# Patient Record
Sex: Male | Born: 1955
Health system: Southern US, Community
[De-identification: ages and names within clinical notes are randomized; demographics above are authoritative.]

## PROBLEM LIST (undated history)

## (undated) DIAGNOSIS — K219 Gastro-esophageal reflux disease without esophagitis: Secondary | ICD-10-CM

## (undated) DIAGNOSIS — E785 Hyperlipidemia, unspecified: Secondary | ICD-10-CM

## (undated) DIAGNOSIS — M1990 Unspecified osteoarthritis, unspecified site: Secondary | ICD-10-CM

## (undated) DIAGNOSIS — T7840XA Allergy, unspecified, initial encounter: Secondary | ICD-10-CM

## (undated) DIAGNOSIS — R05 Cough: Secondary | ICD-10-CM

## (undated) DIAGNOSIS — R059 Cough, unspecified: Secondary | ICD-10-CM

## (undated) DIAGNOSIS — A879 Viral meningitis, unspecified: Secondary | ICD-10-CM

## (undated) DIAGNOSIS — I10 Essential (primary) hypertension: Secondary | ICD-10-CM

## (undated) HISTORY — DX: Viral meningitis, unspecified: A87.9

## (undated) HISTORY — DX: Essential (primary) hypertension: I10

## (undated) HISTORY — PX: TOTAL KNEE ARTHROPLASTY: SHX125

## (undated) HISTORY — DX: Gastro-esophageal reflux disease without esophagitis: K21.9

## (undated) HISTORY — DX: Allergy, unspecified, initial encounter: T78.40XA

## (undated) HISTORY — DX: Unspecified osteoarthritis, unspecified site: M19.90

## (undated) HISTORY — DX: Hyperlipidemia, unspecified: E78.5

## (undated) HISTORY — PX: JOINT REPLACEMENT: SHX530

## (undated) HISTORY — PX: NASAL SINUS SURGERY: SHX719

---
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
## 2007-09-15 ENCOUNTER — Ambulatory Visit: Payer: Self-pay

## 2009-10-03 ENCOUNTER — Ambulatory Visit: Payer: Self-pay | Admitting: Internal Medicine

## 2010-11-08 ENCOUNTER — Emergency Department: Payer: Self-pay | Admitting: Internal Medicine

## 2010-11-12 ENCOUNTER — Ambulatory Visit: Payer: Self-pay | Admitting: Family Medicine

## 2010-12-03 ENCOUNTER — Ambulatory Visit: Payer: Self-pay | Admitting: Pain Medicine

## 2011-05-25 ENCOUNTER — Ambulatory Visit: Payer: Self-pay | Admitting: Nephrology

## 2012-01-16 ENCOUNTER — Inpatient Hospital Stay: Payer: Self-pay | Admitting: Internal Medicine

## 2012-01-16 LAB — CBC
HCT: 42.2 % (ref 40.0–52.0)
MCH: 30.7 pg (ref 26.0–34.0)
MCHC: 33.6 g/dL (ref 32.0–36.0)
RDW: 13.7 % (ref 11.5–14.5)
WBC: 13.3 10*3/uL — ABNORMAL HIGH (ref 3.8–10.6)

## 2012-01-16 LAB — DRUG SCREEN, URINE
Barbiturates, Ur Screen: NEGATIVE (ref ?–200)
Benzodiazepine, Ur Scrn: POSITIVE (ref ?–200)
Cannabinoid 50 Ng, Ur ~~LOC~~: NEGATIVE (ref ?–50)
Methadone, Ur Screen: NEGATIVE (ref ?–300)
Opiate, Ur Screen: POSITIVE (ref ?–300)

## 2012-01-16 LAB — URINALYSIS, COMPLETE
Bilirubin,UR: NEGATIVE
Glucose,UR: NEGATIVE mg/dL (ref 0–75)
Hyaline Cast: 3
Ketone: NEGATIVE
Nitrite: NEGATIVE
Ph: 5 (ref 4.5–8.0)
Protein: 100
RBC,UR: 5 /HPF (ref 0–5)
Specific Gravity: 1.02 (ref 1.003–1.030)

## 2012-01-16 LAB — COMPREHENSIVE METABOLIC PANEL
Alkaline Phosphatase: 70 U/L (ref 50–136)
Anion Gap: 11 (ref 7–16)
Calcium, Total: 8.7 mg/dL (ref 8.5–10.1)
Chloride: 108 mmol/L — ABNORMAL HIGH (ref 98–107)
EGFR (Non-African Amer.): 40 — ABNORMAL LOW
Glucose: 139 mg/dL — ABNORMAL HIGH (ref 65–99)
Osmolality: 289 (ref 275–301)
Potassium: 3.4 mmol/L — ABNORMAL LOW (ref 3.5–5.1)

## 2012-01-16 LAB — TROPONIN I
Troponin-I: 0.22 ng/mL — ABNORMAL HIGH
Troponin-I: 0.85 ng/mL — ABNORMAL HIGH

## 2012-01-16 LAB — HEMOGLOBIN A1C: Hemoglobin A1C: 5.7 % (ref 4.2–6.3)

## 2012-01-16 LAB — CK TOTAL AND CKMB (NOT AT ARMC)
CK, Total: 2519 U/L — ABNORMAL HIGH (ref 35–232)
CK-MB: 8 ng/mL — ABNORMAL HIGH (ref 0.5–3.6)

## 2012-01-17 DIAGNOSIS — I517 Cardiomegaly: Secondary | ICD-10-CM

## 2012-01-17 DIAGNOSIS — R748 Abnormal levels of other serum enzymes: Secondary | ICD-10-CM

## 2012-01-17 LAB — CBC WITH DIFFERENTIAL/PLATELET
Basophil #: 0 x10 3/mm 3
Basophil %: 0.2 %
Eosinophil #: 0 x10 3/mm 3
Eosinophil %: 0 %
HCT: 40 %
HGB: 13.3 g/dL
Lymphocyte %: 5.6 %
Lymphs Abs: 1 x10 3/mm 3
MCH: 30.4 pg
MCHC: 33.3 g/dL
MCV: 91 fL
Monocyte #: 1.3 "x10 3/mm " — ABNORMAL HIGH
Monocyte %: 7.4 %
Neutrophil #: 14.9 x10 3/mm 3 — ABNORMAL HIGH
Neutrophil %: 86.8 %
Platelet: 132 x10 3/mm 3 — ABNORMAL LOW
RBC: 4.38 x10 6/mm 3 — ABNORMAL LOW
RDW: 13.7 %
WBC: 17.1 x10 3/mm 3 — ABNORMAL HIGH

## 2012-01-17 LAB — LIPID PANEL
Cholesterol: 93 mg/dL
HDL Cholesterol: 32 mg/dL — ABNORMAL LOW
Ldl Cholesterol, Calc: 44 mg/dL
Triglycerides: 86 mg/dL
VLDL Cholesterol, Calc: 17 mg/dL

## 2012-01-17 LAB — BASIC METABOLIC PANEL
BUN: 22 mg/dL — ABNORMAL HIGH (ref 7–18)
Chloride: 106 mmol/L (ref 98–107)
Co2: 25 mmol/L (ref 21–32)
Creatinine: 1.34 mg/dL — ABNORMAL HIGH (ref 0.60–1.30)
EGFR (African American): >60
EGFR (Non-African Amer.): 60 — ABNORMAL LOW
Potassium: 3.7 mmol/L (ref 3.5–5.1)
Sodium: 142 mmol/L (ref 136–145)

## 2012-01-18 LAB — CBC WITH DIFFERENTIAL/PLATELET
Basophil #: 0 10*3/uL (ref 0.0–0.1)
Basophil %: 0.2 %
HGB: 12.9 g/dL — ABNORMAL LOW (ref 13.0–18.0)
Lymphocyte #: 1.2 10*3/uL (ref 1.0–3.6)
Monocyte %: 5.9 %
Neutrophil #: 11 10*3/uL — ABNORMAL HIGH (ref 1.4–6.5)
Neutrophil %: 84.6 %
Platelet: 137 10*3/uL — ABNORMAL LOW (ref 150–440)
RBC: 4.24 10*6/uL — ABNORMAL LOW (ref 4.40–5.90)

## 2012-01-18 LAB — BASIC METABOLIC PANEL
Calcium, Total: 8.1 mg/dL — ABNORMAL LOW (ref 8.5–10.1)
Co2: 25 mmol/L (ref 21–32)
EGFR (African American): >60
EGFR (Non-African Amer.): >60
Glucose: 104 mg/dL — ABNORMAL HIGH (ref 65–99)
Osmolality: 285 (ref 275–301)
Sodium: 142 mmol/L (ref 136–145)

## 2012-01-18 LAB — CK TOTAL AND CKMB (NOT AT ARMC): CK, Total: 4337 U/L — ABNORMAL HIGH (ref 35–232)

## 2012-01-19 LAB — CBC WITH DIFFERENTIAL/PLATELET
Basophil #: 0 10*3/uL (ref 0.0–0.1)
Eosinophil %: 0.6 %
HGB: 12.5 g/dL — ABNORMAL LOW (ref 13.0–18.0)
Lymphocyte #: 1.1 10*3/uL (ref 1.0–3.6)
MCH: 31.3 pg (ref 26.0–34.0)
MCHC: 34.3 g/dL (ref 32.0–36.0)
MCV: 91 fL (ref 80–100)
Monocyte %: 9.9 %
Neutrophil #: 8.2 10*3/uL — ABNORMAL HIGH (ref 1.4–6.5)
Neutrophil %: 78.2 %
RDW: 14 % (ref 11.5–14.5)
WBC: 10.4 10*3/uL (ref 3.8–10.6)

## 2012-01-19 LAB — BASIC METABOLIC PANEL
Co2: 25 mmol/L (ref 21–32)
EGFR (Non-African Amer.): >60
Glucose: 104 mg/dL — ABNORMAL HIGH (ref 65–99)
Sodium: 141 mmol/L (ref 136–145)

## 2012-01-22 LAB — CULTURE, BLOOD (SINGLE)

## 2012-01-25 ENCOUNTER — Ambulatory Visit: Payer: Self-pay | Admitting: Internal Medicine

## 2012-05-14 ENCOUNTER — Ambulatory Visit: Payer: Self-pay | Admitting: Internal Medicine

## 2013-04-09 ENCOUNTER — Ambulatory Visit: Payer: Self-pay | Admitting: Family Medicine

## 2013-04-19 ENCOUNTER — Ambulatory Visit: Payer: Self-pay

## 2013-04-19 LAB — RAPID STREP-A WITH REFLX: Micro Text Report: NEGATIVE

## 2013-04-21 LAB — BETA STREP CULTURE(ARMC)

## 2013-05-25 ENCOUNTER — Ambulatory Visit: Payer: Self-pay | Admitting: Family Medicine

## 2013-06-18 IMAGING — CR DG CHEST 2V
1 series · 2 of 2 positions shown · non-contrast
Comparison: none

REASON FOR EXAM: bacteremia
COMMENTS:

PROCEDURE:     DXR - DXR CHEST PA (OR AP) AND LATERAL  - January 18, 2012  [DATE]
RESULT:     The lungs are clear of acute infiltrates. Mild atelectasis is
noted. The cardiovascular structures are unremarkable.

[Series 1: w chest pa · 0.14mm/px · 2 of 2 slices shown]
[im 1/2]
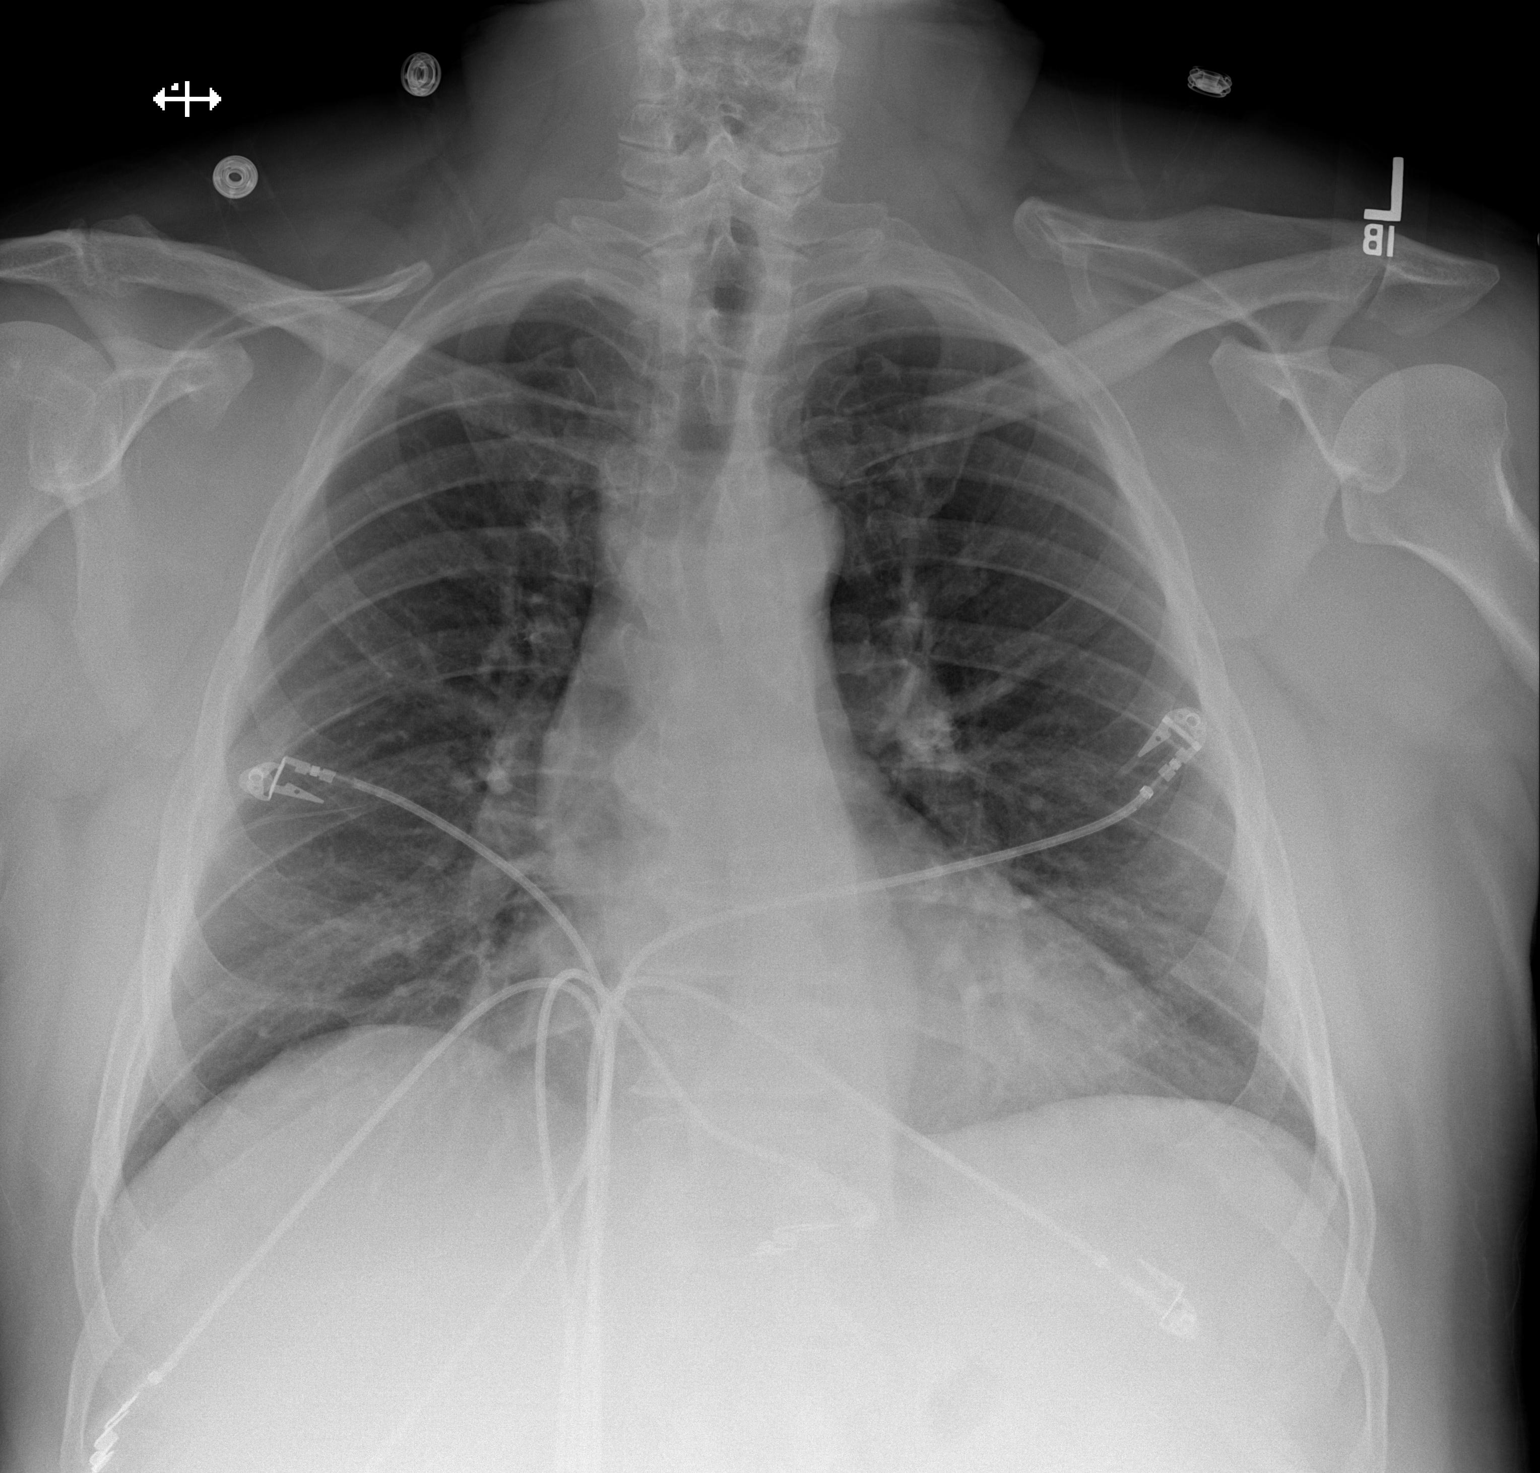
[im 2/2]
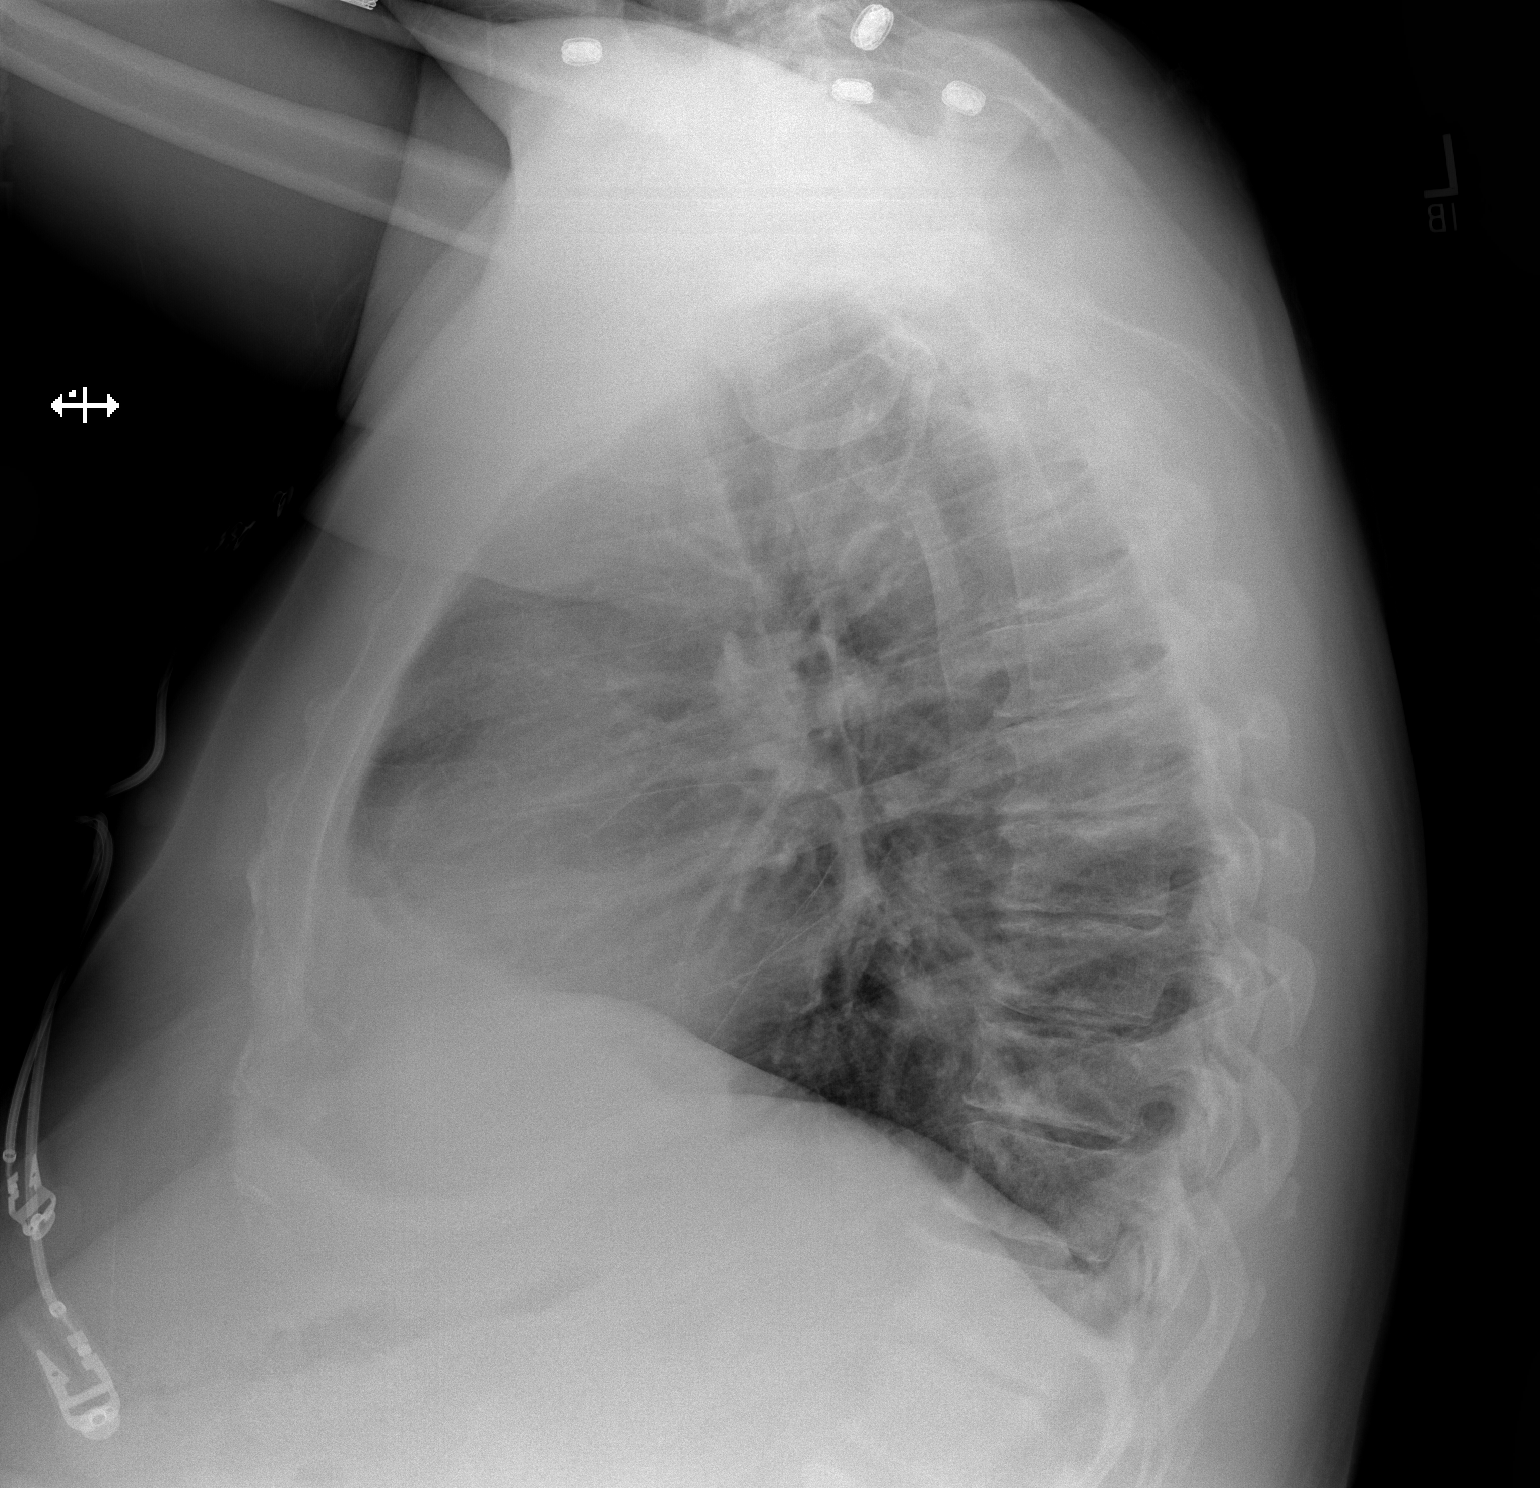

[2 of 2 positions shown; findings below may reference images not displayed]

IMPRESSION: No acute abnormality.

## 2013-12-19 ENCOUNTER — Ambulatory Visit: Payer: Self-pay | Admitting: Family Medicine

## 2014-01-30 LAB — CBC AND DIFFERENTIAL
HCT: 42 % (ref 41–53)
Hemoglobin: 14.3 g/dL (ref 13.5–17.5)
Platelets: 227 10*3/uL (ref 150–399)
WBC: 8 10^3/mL

## 2014-04-17 LAB — LIPID PANEL
Cholesterol: 201 mg/dL — AB (ref 0–200)
HDL: 53 mg/dL (ref 35–70)
LDL Cholesterol: 125 mg/dL
Triglycerides: 116 mg/dL (ref 40–160)

## 2014-04-17 LAB — BASIC METABOLIC PANEL
CREATININE: 1.1 mg/dL (ref 0.6–1.3)
Glucose: 82 mg/dL

## 2014-04-17 LAB — HEPATIC FUNCTION PANEL
ALK PHOS: 77 U/L (ref 25–125)
ALT: 37 U/L (ref 10–40)
AST: 26 U/L (ref 14–40)
Bilirubin, Total: 0.3 mg/dL

## 2014-04-17 LAB — HEMOGLOBIN A1C: Hgb A1c MFr Bld: 5.9 % (ref 4.0–6.0)

## 2014-05-02 ENCOUNTER — Ambulatory Visit: Payer: Self-pay | Admitting: Emergency Medicine

## 2014-05-02 LAB — URINALYSIS, COMPLETE
BACTERIA: NEGATIVE
Bilirubin,UR: NEGATIVE
Blood: NEGATIVE
Glucose,UR: NEGATIVE
Ketone: NEGATIVE
Leukocyte Esterase: NEGATIVE
Nitrite: NEGATIVE
Ph: 7 (ref 5.0–8.0)
Protein: NEGATIVE
RBC,UR: NONE SEEN /HPF (ref 0–5)
Specific Gravity: 1.015 (ref 1.000–1.030)
WBC UR: NONE SEEN /HPF (ref 0–5)

## 2014-05-02 LAB — OCCULT BLOOD X 1 CARD TO LAB, STOOL: Occult Blood, Feces: NEGATIVE

## 2014-08-21 ENCOUNTER — Ambulatory Visit: Payer: Self-pay | Admitting: Physician Assistant

## 2014-09-26 ENCOUNTER — Ambulatory Visit: Payer: Self-pay

## 2014-12-08 NOTE — Consult Note (Signed)
General Aspect 59 year old Caucasian male with past medical history significant for history of arthritis, insomnia, and chronic sinus problems status post recent sinus surgery two days ago at [facility] who presented to the hospital with complaints of altered mental status, rigors. Found to have hypotension, tachycardia on arrival. Cardiologu was consulted for elevated cardiac enz.  the patient was doing well up until 8 a.m. on the day of admission.  he became chilly, requested to have blankets, became very confused. He took off his underwear as well as his shorts.  He became profusely sweaty and was visibly shaking. His fevers were ranging from as high as 104.   In the Emergency Room, he was hypotensive with systolic blood pressure in the 80s,  tachycardic with a heart rate of 110. The patient was given IV fluids now here in the Emergency Room, started on ABX. cardiac enz have trended upwards x 2.    Present Illness . PAST SURGICAL HISTORY:  1. Endoscopic nasal surgery, sinus surgery, two days ago. 2. Left arm surgery. 3. Left forehead surgery in the past.  ALLERGIES: No known drug allergies.   FAMILY HISTORY: The patient's father died of phlebitis at the age of 5. The patient's mother had breast carcinoma at the age of 98 and died at the age of 32.  Social Hx:  The patient is married and has one daughter who lives close by. He does not smoke. He drinks alcohol occasionally, socially. He was an Clinical biochemist.   Physical Exam:   GEN well developed, well nourished, no acute distress    HEENT red conjunctivae    NECK supple  No masses    RESP normal resp effort  clear BS    CARD Regular rate and rhythm  No murmur    ABD soft    SKIN normal to palpation    NEURO motor/sensory function intact    PSYCH alert, A+O to time, place, person, good insight   Review of Systems:   Subjective/Chief Complaint malaise    General: Weakness    Skin: No Complaints    ENT: Nosebleeds  sinus  swelling following surgery    Eyes: No Complaints    Neck: No Complaints    Respiratory: No Complaints    Cardiovascular: No Complaints    Gastrointestinal: No Complaints    Genitourinary: No Complaints    Vascular: No Complaints    Musculoskeletal: No Complaints    Neurologic: No Complaints    Hematologic: No Complaints    Endocrine: No Complaints    Psychiatric: No Complaints    Review of Systems: All other systems were reviewed and found to be negative    Medications/Allergies Reviewed Medications/Allergies reviewed     Sepsis: 16-Jan-2012   Hypercholesterolemia:    Meningitis:    GERD:    Rhinoplasty:    Arm Surgery - Left:    sutures to forehead:    Denies surgical history.:        Admit Diagnosis:   SEPSIS: 17-Jan-2012, Active, SEPSIS      Admit Reason:   Sepsis: (038.9) Active, ICD9, Unspecified septicemia  Home Medications: Medication Instructions Status  clindamycin 150 mg oral capsule 1 cap(s) orally 3 times a day. **start date 01/14/12 end date 01/21/12** Active  acetaminophen-oxycodone 325 mg-5 mg oral tablet 1 tab(s) orally every 4 to 6 hours as needed. Active  clonazepam 1 mg oral tablet 1 tab(s) orally 2 times a day as needed. **hold per md due to surgery as of 01/16/12**  Active  Mucinex D Max Strength 120 mg-1200 mg oral tablet, extended release 1 tab(s) orally every 12 hours as needed. **hold per md due to surgery as of 01/16/12** Active  omeprazole 20 mg oral delayed release capsule 1 cap(s) orally once a day. **hold per md due to surgery as of 01/16/12** Active  tamsulosin 0.4 mg oral capsule 1 cap(s) orally once a day. **hold per md due to surgery as of 01/16/12** Active  valacyclovir 500 mg oral tablet 1 tab(s) orally once a day for recurrent genital herpes as directed.  **hold per md due to surgery as of 01/16/12** Active  naproxen 500 mg oral tablet 1 tab(s) orally 2 times a day. **hold per md due to surgery as of 01/16/12** Active  One-A-Day  Men 50 Plus oral tablet 1 tab(s) orally once a day. **complete multivitamin** *hold per md due to surgery as of 01/16/12** Active  terazosin 5 mg oral capsule 1 cap(s) orally once a day (at bedtime). **hold per md due to surgery as of 01/16/12** Active   Lab Results:  Routine Chem:  02-Jun-13 21:31    Result Comment troponin - RESULTS VERIFIED BY REPEAT TESTING.  - prev c/ @ 1516 01/16/12 mpg  Result(s) reported on 16 Jan 2012 at 11:04PM.  03-Jun-13 06:44    Cholesterol, Serum 93   Triglycerides, Serum 86   HDL (INHOUSE)  32   VLDL Cholesterol Calculated 17   LDL Cholesterol Calculated 44 (Result(s) reported on 17 Jan 2012 at 07:41AM.)   Glucose, Serum 98   BUN  22   Creatinine (comp)  1.34   Sodium, Serum 142   Potassium, Serum 3.7   Chloride, Serum 106   CO2, Serum 25   Calcium (Total), Serum  8.3   Anion Gap 11   Osmolality (calc) 286   eGFR (African American) >60   eGFR (Non-African American)  60 (eGFR values <83m/min/1.73 m2 may be an indication of chronic kidney disease (CKD). Calculated eGFR is useful in patients with stable renal function. The eGFR calculation will not be reliable in acutely ill patients when serum creatinine is changing rapidly. It is not useful in  patients on dialysis. The eGFR calculation may not be applicable to patients at the low and high extremes of body sizes, pregnant women, and vegetarians.)  Cardiac:  02-Jun-13 21:31    Troponin I  0.85 (0.00-0.05 0.05 ng/mL or less: NEGATIVE  Repeat testing in 3-6 hrs  if clinically indicated. >0.05 ng/mL: POTENTIAL  MYOCARDIAL INJURY. Repeat  testing in 3-6 hrs if  clinically indicated. NOTE: An increase or decrease  of 30% or more on serial  testing suggests a  clinically important change)   CK, Total  2519   CPK-MB, Serum  8.0  Routine Hem:  03-Jun-13 06:44    WBC (CBC)  17.1   RBC (CBC)  4.38   Hemoglobin (CBC) 13.3   Hematocrit (CBC) 40.0   Platelet Count (CBC)  132   MCV 91   MCH 30.4    MCHC 33.3   RDW 13.7   Neutrophil % 86.8   Lymphocyte % 5.6   Monocyte % 7.4   Eosinophil % 0.0   Basophil % 0.2   Neutrophil #  14.9   Lymphocyte # 1.0   Monocyte #  1.3   Eosinophil # 0.0   Basophil # 0.0 (Result(s) reported on 17 Jan 2012 at 07:34AM.)   EKG:   Interpretation EKG shows sinus tachycardia with rate 111 bpm, nonspecificn ST  ABN V4 to V6    No Known Allergies:   Vital Signs/Nurse's Notes: **Vital Signs.:   03-Jun-13 19:39   Vital Signs Type Routine   Temperature Temperature (F) 98.5   Celsius 36.9   Temperature Source oral   Pulse Pulse 83   Pulse source per Dinamap   Respirations Respirations 20   Systolic BP Systolic BP 998   Diastolic BP (mmHg) Diastolic BP (mmHg) 83   Mean BP 99   BP Source vital sign device   Pulse Ox % Pulse Ox % 96   Pulse Ox Activity Level  At rest   Oxygen Delivery Room Air/ 21 %     Impression 59 year old Caucasian male with past medical history significant for history of arthritis, insomnia, and chronic sinus problems status post recent sinus surgery two days ago at [facility] who presented to the hospital with complaints of altered mental status, rigors. Found to have hypotension, tachycardia on arrival. Cardiologu was consulted for elevated cardiac enz.  he report having stress test 1 1/2 years ago. results not available. not at [facility].  1) Elevated cardiac enz: Suspect demand ischemia from hypotension, tachycardia. few risk factors (no smoking, no diabetes, no family hx) Not a candidate for anticaogulation given recent sinus surgery --Would manage medically at this time including low dose b-blocker as BP permits (resolving sepsis/bacteremia). Hold on asa as no symptoms and nasal packing needs to be pulled on thursday. --Cholesterol excellent --Consider outpt workup, possible stress test  2) bacteremia Gm negative rods growing, sens pending possible complication from sinus surgery? UTI, low grade. No recent foley Feels a  little better management per medical service  3) Sinus surgery Packing in place. [facility] to remove on Thursday   Electronic Signatures: Ida Rogue (MD)  (Signed 03-Jun-13 20:58)  Authored: General Aspect/Present Illness, History and Physical Exam, Review of System, Past Medical History, Health Issues, Home Medications, Labs, EKG , Allergies, Vital Signs/Nurse's Notes, Impression/Plan   Last Updated: 03-Jun-13 20:58 by Ida Rogue (MD)

## 2014-12-08 NOTE — Consult Note (Signed)
Impression: 59yo WM w/ h/o chronic sinusitis, recent sinus surgery who was admitted with Pseudomonas sepsis.  Possible sources of Pseudomonas include the urine, the lung and the sinuses.  Given his recent sinus surgery, I suspect that this was the source.  Per his wife, they removed a large amount of purulent material from his maxilary sinuses.  This may have caused seeding of the blood stream.  He was given clindamycin post operatively which would have no activity against Pseudomonas.  His urinalysis did have some pyuria.  The urine culture is pending.  No CXR was obtained.  The blood cultures have not been confirmed as Pseudomonas, but the hospitalist has been told that this is the organism.  Sensitivities are pending. Will get a CXR today. At [facility], Pseudomonas isolates are resistant to fluoroquinolones about 20% of the time.  Will add meropenem for double coverage until the sensitivities return.  There is some argument to use both a B-lactam and aminoglycoside for synergistic therapy in severe Pseudomonal infection.  Will not add an aminoglycoside at this time due to the renal side effects. His mentation is better, but he is not back to baseline.  Will follow his fever curve, mentation and WBC on therapy.  Electronic Signatures: Raidyn Breiner, Heinz Knuckles (MD)  (Signed on 04-Jun-13 15:38)  Authored  Last Updated: 04-Jun-13 15:38 by Rylee Nuzum, Heinz Knuckles (MD)

## 2014-12-08 NOTE — Consult Note (Signed)
PATIENT NAME:  Derrick Sandoval, Derrick Sandoval MR#:  [id number] DATE OF BIRTH:  03/09/1956  DATE OF CONSULTATION:  01/18/2012  REFERRING PHYSICIAN:  Dr. Dava Najjar CONSULTING PHYSICIAN:  Rosalyn Gess. Kianni Lheureux, MD  REASON FOR CONSULTATION: Pseudomonal sepsis.   HISTORY OF PRESENT ILLNESS: Patient is a 59 year old white man with a history of recurrent sinusitis and recent sinus surgery three days prior to becoming ill who presented with acute onset of one-day history of cold sweats, malaise, mental status and fever. Patient had been in his usual state of health when his wife noted him becoming diaphoretic and somewhat confused. His confusion became significantly worse and she called 911. He was found to have a temperature of 104.1 as well. When EMS came to the home he was hypotensive and was subsequently brought to the hospital. He was admitted and started on ceftriaxone IV. He was also given Unasyn. Blood cultures obtained on admission are growing a gram-negative rod that the lab has told the primary team is Pseudomonas. He has been switched to IV ciprofloxacin. The patient remains confused per his wife but is mentating somewhat better. He still is somewhat diaphoretic but has not been having high spiking fevers since admission. Had a white count of 13.3 on admission which increased to 17.1 yesterday and is down to 13.0 today. Repeat blood cultures from yesterday are also growing a gram-negative rod. Sensitivities are not yet available. The patient states that prior to his admission he had sinus surgery two to three days beforehand. His wife states that he has had recurrent sinus infections and has received a lot of antibiotics. She was told that they drained a significant amount of purulent material from his maxillary sinuses bilaterally. Postoperatively he was placed on clindamycin which he was taking at the time of his admission.   ALLERGIES: None.   PAST MEDICAL HISTORY:  1. Recurrent sinusitis.  2. Recent sinus surgery.   3. Osteoarthritis.  4. Gastroesophageal reflux disease.  5. Benign prostatic hypertrophy.  6. Genital herpes for which he takes suppressive Valtrex.    CURRENT ANTIBIOTICS:  1. Cipro 400 mg p.o. b.i.d.  2. Valtrex 500 mg p.o. daily.   SOCIAL HISTORY: The patient lives with his wife. He has a puppy at home. He does not smoke. He drinks extremely rarely.   FAMILY HISTORY: Positive for breast cancer in his mother and prostate cancer in his father.   REVIEW OF SYSTEMS: GENERAL: Positive fevers, chills, sweats, and malaise. HEENT: Positive for headaches and sinus congestion related to his recent surgery. He had epistaxis prior to admission. He continues to have congestion in his nose related to the pledgets still in place. He denies any sore throat. NECK: No stiffness. No swollen glands. RESPIRATORY: No cough. No shortness of breath. No sputum production. CARDIAC: No chest pains or palpitations. GASTROINTESTINAL: No nausea, no vomiting, no abdominal pain, no change in his bowels. GENITOURINARY: No change in his urine. MUSCULOSKELETAL: No acute changes. NEUROLOGIC: No focal weakness. He was markedly confused at the time of his admission, this is somewhat improved. PSYCHIATRIC: No complaints. All other systems are negative.   PHYSICAL EXAMINATION:  VITAL SIGNS: T-max 100.8, T-current 99.3, pulse 85, blood pressure 146/92, 95% on room air.   GENERAL: 59 year old white man mildly confused but in no acute distress.   HEENT: Normocephalic, atraumatic. Pupils equal, reactive to light. Extraocular motion intact. Sclerae, conjunctivae, and lids are without evidence for emboli or take petechiae. Nasal exam shows bloody pledgets in place in the nasal passages. Oropharynx  shows no erythema or exudate. Teeth and gums are in fair condition.   NECK: Supple. Full range of motion. Midline trachea. No lymphadenopathy. No thyromegaly.   CHEST: Clear to auscultation bilaterally. Good air movement. No focal  consolidation.   CARDIAC: Regular rate and rhythm without murmur, rub, or gallop.   ABDOMEN: Soft, nontender, nondistended. No hepatosplenomegaly. No hernia is noted.   EXTREMITIES: No evidence for tenosynovitis.   SKIN: He was somewhat diaphoretic and warm to touch. He had no obvious rashes. There was no stigmata of endocarditis, specifically no Janeway lesions nor Osler nodes.   NEUROLOGIC: The patient was awake and interactive. He appeared somewhat confused at times. At one point he got up to use the urinal and began urinating before he had opened the urinal itself. He did not appear to feel there was a problem with this.    PSYCHIATRIC: Mood and affect appeared normal but somewhat difficult to assess due to his mild confusion.   LABORATORY, DIAGNOSTIC AND RADIOLOGICAL DATA: BUN 18, creatinine 1.22, bicarbonate 25, anion gap 9. LFTs were unremarkable except for an AST of 46. CPKs were elevated as well as the MB fraction. The troponins peaked at 0.85. TSH was normal. Tox screen was positive for benzodiazepines and opiates. White count on admission was 13.3 and peaked at 17.1 today. White count was 13.0 with a hemoglobin 12.9, platelet count 137, ANC 11.0. Blood cultures from admission are growing gram-negative rod in two of four bottles. Repeat blood cultures from yesterday are growing a gram-negative rod in one of four bottles. A urinalysis showed cloudy urine with 2+ blood, 100 mg/dL protein, negative nitrites, 2+ leukocyte esterase, 5 red cells and 78 white cells with two epithelial cells. Urine culture is currently pending. No chest x-ray is available for review.   IMPRESSION: 59 year old white man with a history of chronic sinusitis and recent sinus surgery who was admitted with pseudomonal sepsis.   RECOMMENDATIONS:  1. Possible sources of Pseudomonas include the urine, the lung and the sinuses. Given his recent sinus surgery I suspect this was the source. Per his wife they removed a large  amount of purulent material from maxillary sinuses. This may have caused seeding of the blood stream. He was given clindamycin postoperatively which would have no activity against Pseudomonas. His urinalysis did have some pyuria, however. The urine culture is pending. No chest x-ray was obtained.  2. The blood cultures have not been confirmed as Pseudomonas but the hospitalist has been told that this is the organism. Sensitivities are currently pending.  3. I will get a chest x-ray today.  4. At [facility] pseudomonas isolates are resistant to fluoroquinolones about 20% of the time. Will add meropenem for double coverage until the sensitivities return. There is some argument to use both a beta lactam and aminoglycoside for synergistic therapy in severe pseudomonal infection. Will not add aminoglycoside at this time due to the potential renal side effects.  5. His mentation is better today but he is not back to his baseline per his wife. Will follow his fever curve, mentation and white blood count on therapy.   This is a high-level infectious disease consult. Thank you very much for involving me in Mr. Aronoff's care.  ____________________________ Rosalyn Gess. Damion Kant, MD meb:cms D: 01/18/2012 15:49:37 ET T: 01/18/2012 16:23:47 ET JOB#: [id number]  cc: Rosalyn Gess. Lorilyn Laitinen, MD, <Dictator>  Calli Bashor E Lorita Forinash MD ELECTRONICALLY SIGNED 01/25/2012 8:43

## 2014-12-08 NOTE — H&P (Signed)
PATIENT NAME:  Derrick Sandoval, Derrick Sandoval MR#:  [id number] DATE OF BIRTH:  August 18, 1955  DATE OF ADMISSION:  01/16/2012  PRIMARY CARE PHYSICIAN: Golden Pop, MD  HISTORY OF PRESENT ILLNESS: The patient is a 59 year old Caucasian male with past medical history significant for history of arthritis, insomnia, and chronic sinus problems status post recent sinus surgery two days ago who presented to the hospital with complaints of altered mental status. According to the patient's family, the patient's wife who is present during my interview, the patient was doing well up until 8 a.m. on the day of admission. When he got up in the morning he took his medications however later on he became chilly. He requested to have blankets on him so the patient's wife covered him with blankets. He became very confused. He took off his underwear as well as his shorts. However, later on when he tried to get up, he was not able to do so. He became profusely sweaty and was visibly shaking. His fevers were ranging from as high as 104.1 today and he was brought to the Emergency Room for further evaluation. In the Emergency Room, he was hypotensive with systolic blood pressure in the 90s and hospitalist services were contacted for admission. He was tachycardic with a heart rate of 110. The patient was given IV fluids now here in the Emergency Room and is doing a little bit better. He admits of having some headaches which seemed to be a little bit better with Tylenol, however, he denies any chest pain prior to coming to the Emergency Room or in the Emergency Room or now.   PAST MEDICAL HISTORY:  1. Arthritis. 2. Chronic insomnia. 3. Chronic sinus problems status post sinus surgery two days ago.  4. Gastroesophageal reflux disease.  5. Benign prostatic hypertrophy.  6. Genital herpes.   MEDICATIONS:  1. Acetaminophen/oxycodone 5/325 mg 1 tablet every 4 to 6 hours as needed. 2. Clindamycin 150 mg 1 capsule three times daily, started on  01/14/2012 and to be ended on 01/21/2012.  3. Clonazepam 1 mg p.o. twice daily.  4. Mucinex D 1 tablet twice daily. 5. Naproxen 500 mg twice daily. 6. Omeprazole 20 mg p.o. daily. 7. Once-A-Day Men 50 Plus one tablet once daily. 8. Tamsulosin 0.4 mg p.o. daily.  9. Terazosin 5 mg p.o. daily at bedtime. 10. Valacyclovir 500 mg p.o. daily.   NOTE: The patient has been taking only acetaminophen/oxycodone as well as clindamycin recently as all other medications was suspended due to surgery.   PAST SURGICAL HISTORY:  1. Endoscopic nasal surgery, sinus surgery, two days ago. 2. Left arm surgery. 3. Left forehead surgery in the past.  ALLERGIES: No known drug allergies.   FAMILY HISTORY: The patient's father died of phlebitis at the age of 63. The patient's mother had breast carcinoma at the age of 48 and died at the age of 66. The patient is married and has one daughter who lives close by. He does not smoke. He drinks alcohol occasionally, socially. He was an Clinical biochemist.   REVIEW OF SYSTEMS: Positive for having fevers as high as 104, having chills, having confusion, fatigue and weakness earlier today, having headaches, having recent surgery two days ago, sinus surgery, having some nasal bleed recently over the past day or so which seems to be worsening, some problems with urination and the patient admits of having decreased frequency of urination especially yesterday. He was able to urinate only four times yesterday. Difficulty to start urinating, however, he denies  any flank pain. Admits of having some dripping after he voids and feels that he is not emptying his bladder. Admits of some incontinence intermittently. CONSTITUTIONAL: Otherwise, he denies any pain, denies any weight loss or gain. EYES: Denies blurry vision, double vision, glaucoma, or cataracts. ENT: Denies any tinnitus, allergies, epistaxis, sinus pain, dentures, or difficulty swallowing. RESPIRATORY: Denies cough, wheeze, asthma, or  chronic obstructive pulmonary disease. CARDIOVASCULAR: Denies chest pain, orthopnea, edema, arrhythmias or syncope. GASTROINTESTINAL: Denies nausea, vomiting, diarrhea, or constipation. GENITOURINARY: Denies any dysuria, hematuria, or frequency. ENDOCRINE: Denies polydipsia, nocturia, thyroid problems, heat or cold intolerance, or thirst. HEMATOLOGIC: Denies any anemia, easy bruising, bleeding, or swollen glands. SKIN: Denies any acne, rashes, lesions, or change in moles. MUSCULOSKELETAL: Denies arthritis, cramps, swelling, or gout. NEUROLOGIC: Denies numbness, epilepsy, or tremor. PSYCHIATRIC: Denies anxiety, insomnia, or depression.   PHYSICAL EXAMINATION:   VITAL SIGNS: On arrival to the hospital, temperature was 99.8, pulse 110, respiration 22, blood pressure 99/62, and saturating 95% on room air.  GENERAL: This is a well developed, well nourished obese Caucasian male in no significant distress lying on the stretcher. He is somewhat flushed in his face and diaphoretic. He clots in his through nasal cavities.   HEENT: His pupils are equal and reactive to light. Extraocular movements are intact. No icterus or conjunctivitis. Normal hearing. No pharyngeal erythema. Mucosa is moist.   NECK: No masses, supple and nontender. Thyroid is not enlarged. No adenopathy. No JVD or carotid bruits. Full range of motion.   LUNGS: Clear to auscultation in all fields. No rales, rhonchi, or diminished breath sounds. Good inspiratory effort. No dullness to percussion. No overt respiratory distress.   HEART:  S1 and S2 appreciated. No murmurs, gallops, or rubs were noted. PMI not lateralized. Chest is nontender to palpation.   EXTREMITIES: 1+ pedal pulses. No lower extremity edema, calf tenderness, or cyanosis.   ABDOMEN: Soft, nontender. Bowel sounds present. No hepatosplenomegaly or masses were noted.   RECTAL: Deferred.   MUSCULOSKELETAL: Able to move all extremities. No cyanosis, degenerative joint disease,  or kyphosis. Gait is steady.   SKIN: No rashes, lesions, erythema, nodularity, or induration. It was warm and dry to palpation.   LYMPH: No adenopathy in the cervical region.   NEUROLOGIC: Cranial nerves grossly intact. Sensory is intact. No dysarthria or aphasia.   PSYCH: The patient is alert and oriented to time, person, and place. Cooperative. Memory is good. No significant confusion, agitation, or depression noted.   LABS/STUDIES: BMP showed glucose of 139, BUN and creatinine were 19 and 1.88, potassium 3.4, and chloride 108. Estimated GFR for non-African American would be 40. Liver enzymes showed AST elevation to 46. CK total is up to 433. Troponin 0.22.   Urine drug screen is positive for benzodiazepines as well as opiates.   White blood cell count is elevated to 13.3, hemoglobin 14.2, and platelet count 155. Coagulation panel is not done yet.   Urinalysis: Amber cloudy urine, negative for glucose, bilirubin or ketones, specific gravity 1.020, pH 5.0, 2+ blood, 100 mg/dL of protein, negative for nitrites, 2+ leukocyte esterase, 5 red blood cells, 78 white blood cells, trace bacteria, 2 epithelial cells present, white blood cell clumps as well as mucus, and 3 hyaline casts were also present.   EKG showed sinus tachycardia at 111 beats per minute, normal axis, no acute ST-T changes were noted.   RADIOLOGIC STUDIES: Not done.  ASSESSMENT AND PLAN:  1. Sepsis likely due to acute pyelonephritis. Get blood cultures,  start the patient on Unasyn, adjust antibiotics according to culture results. 2. Acute pyelonephritis. Start on antibiotics as above, adjust antibiotics according to culture results.   3. Acute renal failure. Continue the patient on IV fluids, get bladder scan, and place a Foley catheter if the patient's bladder scan is above 200. Restart the patient on tamsulosin and add  finasteride. We will continue the patient on IV fluids as above and check the patient's creatinine in the  morning.  4. Hypotension. Continue IV fluids at a high rate.  5. Myocardial infarction, second type. Unable to give the patient aspirin or heparin due to ongoing through nasal bleed. We will get echocardiogram as well as get a cardiology consultation. We will not be able to use metoprolol as well as nitroglycerin due to hypotension. The patient does not report any chest pain. We will follow the patient's cardiac enzymes x3.  6. Recent sinus surgery with infection. We will change the patient's clindamycin to Unasyn IV for now while he is in the hospital and later to if cultures support it upon discharge home.   TIME SPENT: 50 minutes.  ____________________________ Theodoro Grist, MD rv:slb D: 01/16/2012 17:38:33 ET T: 01/17/2012 07:13:32 ET JOB#: [id number]  cc: Theodoro Grist, MD, <Dictator> Guadalupe Maple, MD [place] MD ELECTRONICALLY SIGNED 01/18/2012 19:07

## 2014-12-08 NOTE — Discharge Summary (Signed)
PATIENT NAME:  Derrick Sandoval, Derrick Sandoval MR#:  [id number] DATE OF BIRTH:  October 31, 1955  DATE OF ADMISSION:  01/16/2012 DATE OF DISCHARGE:  01/19/2012  DIAGNOSES:  1. Encephalopathy possibly due to sepsis.  2. Sepsis due to Pseudomonas bacteremia, likely source sinuses/recent sinus surgery.  3. Acute renal failure, resolved.  4. Hypotension.  5. Elevated troponin due to mild ischemia.  6. Leukocytosis.  7. Reactive hyperglycemia.  8. Mild rhabdomyolysis.  9. Hemodilutional anemia. 10. Thrombocytopenia:   DISPOSITION: The patient is being discharged home.   FOLLOWUP: Follow up with ENT at [facility] on 01/20/2012. Follow with Dr. Clayborn Bigness and Dr. Jeananne Rama in 1 to 2 weeks after discharge.   DIET: Low sodium.   ACTIVITY: As tolerated.   DISCHARGE MEDICATIONS:  1. Cipro 500 mg b.i.d. for 14 days.  2. Lipitor 10 mg at bedtime.  3. Lopressor 25 mg b.i.d.  4. Tylenol/oxycodone 325/5 q.4-6h. p.r.n.  5. Klonopin 1 mg b.i.d. p.r.n.  6. Mucinex 120/1200 mg b.i.d. as needed.  7. Omeprazole 20 mg daily.  8. Flomax 0.5 mg daily.  9. Valacyclovir 500 mg once a day for recurrent genital herpes. 10. Once a day multivitamin 1 tablet daily.  11. Terazosin 5 mg at bedtime.   CONSULTATIONS: 1. ID consultation with Dr. Clayborn Bigness. 2. Cardiology consultation with Dr. Rockey Situ.  LABORATORY, RADIOLOGICAL AND DIAGNOSTIC DATA: Echo showed normal ejection fraction, impaired LV relaxation, right ventricular systolic function is normal. Right atrium is mildly dilated. Blood cultures grew out Pseudomonas. Urine culture negative. White count ranging from 17 to normal. Hemoglobin 14.2 to 12.9, platelets 155 to 132. Cardiac enzymes ranging from 0.22 to 0.85. Urine drug screen positive for benzodiazepines and opiates. Glucose 139 to 104. VLDL 17, LDL 44, triglycerides 86. HDL 32, potassium 3.4. Creatinine 1.88 on admission, normal by the time of discharge. Chest x-ray showed no acute abnormality.   HOSPITAL COURSE: The  patient is a 59 year old male with past medical history of osteoarthritis, chronic insomnia, chronic sinus problem, gastroesophageal reflux disease, benign prostatic hypertrophy and genital herpes. He had sinus surgery three to four days prior to admission and was taking p.r.n. Klonopin and narcotic analgesics. His wife brought him in because of confusion and encephalopathy. The patient's encephalopathy was felt to be due to sepsis. The patient grew out Pseudomonas in his blood. He was initially admitted for possible pyelonephritis, but had no evidence of pyelonephritis and his urine culture was inconclusive. The source of the patient's Pseudomonas was likely his sinuses. As per history, the patient has had extensive problems with his sinuses. Recently had sinus surgery with drainage of pus from his maxillary sinuses. Dr. Clayborn Bigness was consulted who recommended not repeating his blood cultures. The patient's Pseudomonas was pansensitive during the hospitalization. He was treated with Cipro and is being discharged home on oral Cipro for 14 days. He had acute renal failure and hypotension on presentation which have resolved with IV fluid hydration. He had very mildly elevated troponins. However, he had no chest pain. No EKG changes. This was possibly demand ischemia from sepsis. Cardiology consultation with Dr. Rockey Situ was obtained who recommended conservative management. The patient is being discharged on Lipitor and beta blocker. His ejection fraction is normal. He had epistaxis due to his recent sinus surgery. Therefore, he could not receive any anticoagulants. He had mild rhabdomyolysis with elevated CK possibly due to recent surgery and fever/chills with rigors. He was treated with IV fluids for that. He developed mild hemodilutional anemia, but remains asymptomatic. He had  mild hypokalemia which was supplemented. The patient is being discharged home in a stable condition. He has an appointment with [facility] ENT on  01/20/2012 for further management of his sinusitis.    TIME SPENT: 45 minutes.    ____________________________ Cherre Huger, MD sp:ap D: 01/19/2012 15:15:35 ET T: 01/20/2012 10:44:06 ET JOB#: [id number]  cc: Cherre Huger, MD, <Dictator> Guadalupe Maple, MD Cherre Huger MD ELECTRONICALLY SIGNED 01/29/2012 7:25

## 2015-01-15 ENCOUNTER — Telehealth: Payer: Self-pay

## 2015-01-15 NOTE — Telephone Encounter (Signed)
Patient needs an appointment

## 2015-01-15 NOTE — Telephone Encounter (Signed)
rx refill request

## 2015-01-15 NOTE — Telephone Encounter (Signed)
Pt called stated he needed a refill on 2 medications could only give me the name of one.   Lansoprazole  Thanks, Colletta [place]

## 2015-01-15 NOTE — Telephone Encounter (Signed)
Pls call pt to sch appt. With Dr. Sanda Klein.

## 2015-01-17 ENCOUNTER — Telehealth: Payer: Self-pay | Admitting: Family Medicine

## 2015-01-17 NOTE — Telephone Encounter (Signed)
Tried multiple times telephone we have is disconnected.

## 2015-01-20 ENCOUNTER — Other Ambulatory Visit: Payer: Self-pay | Admitting: Family Medicine

## 2015-01-28 ENCOUNTER — Encounter: Payer: Self-pay | Admitting: Family Medicine

## 2015-01-28 DIAGNOSIS — R7301 Impaired fasting glucose: Secondary | ICD-10-CM | POA: Insufficient documentation

## 2015-01-28 DIAGNOSIS — I10 Essential (primary) hypertension: Secondary | ICD-10-CM | POA: Insufficient documentation

## 2015-01-28 DIAGNOSIS — R7401 Elevation of levels of liver transaminase levels: Secondary | ICD-10-CM | POA: Insufficient documentation

## 2015-01-28 DIAGNOSIS — K760 Fatty (change of) liver, not elsewhere classified: Secondary | ICD-10-CM | POA: Insufficient documentation

## 2015-01-28 DIAGNOSIS — R740 Nonspecific elevation of levels of transaminase and lactic acid dehydrogenase [LDH]: Secondary | ICD-10-CM

## 2015-01-28 DIAGNOSIS — I517 Cardiomegaly: Secondary | ICD-10-CM | POA: Insufficient documentation

## 2015-01-28 DIAGNOSIS — J309 Allergic rhinitis, unspecified: Secondary | ICD-10-CM | POA: Insufficient documentation

## 2015-01-28 DIAGNOSIS — N401 Enlarged prostate with lower urinary tract symptoms: Secondary | ICD-10-CM | POA: Insufficient documentation

## 2015-01-28 DIAGNOSIS — E785 Hyperlipidemia, unspecified: Secondary | ICD-10-CM | POA: Insufficient documentation

## 2015-01-29 ENCOUNTER — Ambulatory Visit (INDEPENDENT_AMBULATORY_CARE_PROVIDER_SITE_OTHER): Payer: BLUE CROSS/BLUE SHIELD | Admitting: Family Medicine

## 2015-01-29 ENCOUNTER — Encounter: Payer: Self-pay | Admitting: Family Medicine

## 2015-01-29 VITALS — BP 122/88 | HR 75 | Temp 97.8°F | Wt 260.0 lb

## 2015-01-29 DIAGNOSIS — R7401 Elevation of levels of liver transaminase levels: Secondary | ICD-10-CM

## 2015-01-29 DIAGNOSIS — K219 Gastro-esophageal reflux disease without esophagitis: Secondary | ICD-10-CM | POA: Diagnosis not present

## 2015-01-29 DIAGNOSIS — N401 Enlarged prostate with lower urinary tract symptoms: Secondary | ICD-10-CM | POA: Diagnosis not present

## 2015-01-29 DIAGNOSIS — K5900 Constipation, unspecified: Secondary | ICD-10-CM

## 2015-01-29 DIAGNOSIS — K921 Melena: Secondary | ICD-10-CM | POA: Diagnosis not present

## 2015-01-29 DIAGNOSIS — R740 Nonspecific elevation of levels of transaminase and lactic acid dehydrogenase [LDH]: Secondary | ICD-10-CM | POA: Diagnosis not present

## 2015-01-29 DIAGNOSIS — E669 Obesity, unspecified: Secondary | ICD-10-CM

## 2015-01-29 DIAGNOSIS — R7301 Impaired fasting glucose: Secondary | ICD-10-CM | POA: Diagnosis not present

## 2015-01-29 DIAGNOSIS — M179 Osteoarthritis of knee, unspecified: Secondary | ICD-10-CM | POA: Insufficient documentation

## 2015-01-29 DIAGNOSIS — I10 Essential (primary) hypertension: Secondary | ICD-10-CM | POA: Diagnosis not present

## 2015-01-29 DIAGNOSIS — M1712 Unilateral primary osteoarthritis, left knee: Secondary | ICD-10-CM | POA: Diagnosis not present

## 2015-01-29 DIAGNOSIS — E785 Hyperlipidemia, unspecified: Secondary | ICD-10-CM | POA: Diagnosis not present

## 2015-01-29 DIAGNOSIS — M1710 Unilateral primary osteoarthritis, unspecified knee: Secondary | ICD-10-CM | POA: Insufficient documentation

## 2015-01-29 DIAGNOSIS — K760 Fatty (change of) liver, not elsewhere classified: Secondary | ICD-10-CM

## 2015-01-29 LAB — LIPID PANEL PICCOLO, WAIVED
Chol/HDL Ratio Piccolo,Waive: 3.6 mg/dL
Cholesterol Piccolo, Waived: 164 mg/dL (ref <200)
HDL CHOL PICCOLO, WAIVED: 46 mg/dL — AB (ref >59)
LDL Chol Calc Piccolo Waived: 97 mg/dL (ref <100)
Triglycerides Piccolo,Waived: 109 mg/dL (ref <150)
VLDL CHOL CALC PICCOLO,WAIVE: 22 mg/dL (ref <30)

## 2015-01-29 LAB — BAYER DCA HB A1C WAIVED: HB A1C (BAYER DCA - WAIVED): 5.4 % (ref <7.0)

## 2015-01-29 MED ORDER — RANITIDINE HCL 300 MG PO TABS: 300.0000 mg | ORAL_TABLET | Freq: Every day | ORAL | Status: DC

## 2015-01-29 MED ORDER — AMLODIPINE BESYLATE 5 MG PO TABS: 5.0000 mg | ORAL_TABLET | Freq: Every day | ORAL | Status: DC

## 2015-01-29 MED ORDER — MELOXICAM 15 MG PO TABS: 15.0000 mg | ORAL_TABLET | Freq: Every day | ORAL | Status: DC

## 2015-01-29 NOTE — Assessment & Plan Note (Signed)
Cut down on saturated fats; check lpids today

## 2015-01-29 NOTE — Assessment & Plan Note (Signed)
Check glucose and A1C today; wt loss and fewer sweets

## 2015-01-29 NOTE — Progress Notes (Signed)
BP 122/88 mmHg  Pulse 75  Temp(Src) 97.8 F (36.6 C)  Wt 260 lb (117.935 kg)  SpO2 96%   Subjective:    Patient ID: Derrick Ball., male    DOB: [birth year], 59 y.o.   MRN: [id number]  HPI: Derrick Ou. is a 59 y.o. male  Chief Complaint  Patient presents with  . Hyperlipidemia  . Hypertension  . Gastrophageal Reflux   High cholesterol; working on weight loss; trying to limit french fries; occasional hamburger; bologna sandwich once a week; occasional hot dog; does eat cheese, once a week; does eat more than 3 eggs per week  High blood pressure; well-controlled today; not checking at home; taking medicines regularly; he does not add salt at the table; not using Allegra-D, but might use plain Allegra  He is asking for refill of omeprazole; he has GERD; triggers vary; sometimes he eats just about anything without any problems; sometimes he'll eat something and it flares up, spaghetti sauce, chocolate  Obesity; he is trying to take off some of this weight; says it's hard to eat healthy; he is not interested in seeing a nutritionist; he thinks he has lost down from a size 44; eats until he gets full, not eating late  He had arthritis, right knee replacement in November 2015; having pain in the left knee; it's pretty bad, but not doing anything "really" he says; he told the nurse that he is buying hydrocodone off the street  Relevant past medical, surgical, family and social history reviewed and updated as indicated. Interim medical history since our last visit reviewed.  Allergies and medications reviewed and updated.  Review of Systems  Constitutional: Negative for unexpected weight change.  Cardiovascular: Negative for leg swelling.  Gastrointestinal: Positive for constipation and blood in stool.  Genitourinary: Negative for difficulty urinating.       He sees Dr. Jacqlyn Larsen, urologist, thinks issues doing a little better   Per HPI unless specifically indicated above     Objective:    BP 122/88 mmHg  Pulse 75  Temp(Src) 97.8 F (36.6 C)  Wt 260 lb (117.935 kg)  SpO2 96%  Wt Readings from Last 3 Encounters:  01/29/15 260 lb (117.935 kg)  05/08/14 245 lb (111.131 kg)    Physical Exam  Constitutional: He appears well-developed and well-nourished. No distress.  obese  HENT:  Head: Normocephalic and atraumatic.  Nose: No rhinorrhea.  Mouth/Throat: Mucous membranes are normal.  Eyes: EOM are normal. No scleral icterus.  Neck: No JVD present.  Cardiovascular: Normal rate, regular rhythm and normal heart sounds.   No murmur heard. Pulmonary/Chest: Effort normal and breath sounds normal. No respiratory distress. He has no wheezes.  Abdominal: Soft. Bowel sounds are normal. He exhibits no distension. There is no tenderness.  Musculoskeletal: He exhibits no edema.  Surgical scar over the anterior right knee; slightly antalgic gait as he got up from seated position, ambulatory without assistance  Neurological: He is alert. He displays no tremor. He exhibits normal muscle tone.  Skin: Skin is warm and dry. No rash noted. He is not diaphoretic. No cyanosis.  Psychiatric: He has a normal mood and affect. His behavior is normal. Judgment and thought content normal.  Nursing note and vitals reviewed.   Results for orders placed or performed in visit on 01/28/15  CBC and differential  Result Value Ref Range   Hemoglobin 14.3 13.5 - 17.5 g/dL   HCT 42 41 - 53 %   Platelets  227 150 - 399 K/L   WBC 8.0 78^5/YI  Basic metabolic panel  Result Value Ref Range   Glucose 82 mg/dL   Creatinine 1.1 0.6 - 1.3 mg/dL  Lipid panel  Result Value Ref Range   Triglycerides 116 40 - 160 mg/dL   Cholesterol 201 (A) 0 - 200 mg/dL   HDL 53 35 - 70 mg/dL   LDL Cholesterol 125 mg/dL  Hepatic function panel  Result Value Ref Range   Alkaline Phosphatase 77 25 - 125 U/L   ALT 37 10 - 40 U/L   AST 26 14 - 40 U/L   Bilirubin, Total 0.3 mg/dL  Hemoglobin A1c  Result  Value Ref Range   Hgb A1c MFr Bld 5.9 4.0 - 6.0 %      Assessment & Plan:   Problem List Items Addressed This Visit      Cardiovascular and Mediastinum   Benign hypertension    Work on weight loss and DASH guidelines; see pt instructions; refilled med; check Cr      Relevant Medications   amLODipine (NORVASC) 5 MG tablet     Digestive   Fatty liver    Keep working on wt loss and lower fat diet; check LFTs today      GERD (gastroesophageal reflux disease)    Discussed risk of long-term use of PPIs; switch instead of ranitidine; work on wt loss and avoiding triggers      Relevant Medications   docusate sodium (COLACE) 100 MG capsule   ranitidine (ZANTAC) 300 MG tablet     Endocrine   IFG (impaired fasting glucose)    Check glucose and A1C today; wt loss and fewer sweets        Musculoskeletal and Integument   OA (osteoarthritis) of knee    Wt loss is key; talk with surgeon about options for pain control; I advised pt very clearly to NOT buy street drugs; illegal and dangerous; he could die from accidental overdose; I offered help and he declined; Rx given for meloxicam; advise turmeric      Relevant Medications   meloxicam (MOBIC) 15 MG tablet     Genitourinary   Benign prostatic hypertrophy with lower urinary tract symptoms (LUTS)    Managed by urologist        Other   Obesity    Stressed importance of weight loss, as this impacts so many of his other health problems      Elevated transaminase level    Wt loss and low fat diet, check LFTs today      Hyperlipidemia    Cut down on saturated fats; check lpids today      Relevant Medications   amLODipine (NORVASC) 5 MG tablet    Other Visit Diagnoses    Blood in stool    -  Primary    Relevant Orders    Ambulatory referral to Gastroenterology    Constipation, unspecified constipation type        Relevant Orders    Ambulatory referral to Gastroenterology        Follow up plan: Return in about 3  months (around 05/01/2015) for multiple medical issues.

## 2015-01-29 NOTE — Assessment & Plan Note (Signed)
Managed by urologist 

## 2015-01-29 NOTE — Assessment & Plan Note (Signed)
Discussed risk of long-term use of PPIs; switch instead of ranitidine; work on wt loss and avoiding triggers

## 2015-01-29 NOTE — Assessment & Plan Note (Signed)
Keep working on wt loss and lower fat diet; check LFTs today

## 2015-01-29 NOTE — Assessment & Plan Note (Signed)
Stressed importance of weight loss, as this impacts so many of his other health problems

## 2015-01-29 NOTE — Assessment & Plan Note (Signed)
Work on weight loss and DASH guidelines; see pt instructions; refilled med; check Cr

## 2015-01-29 NOTE — Patient Instructions (Signed)
Your goal blood pressure is less than  140 on top Try to follow the DASH guidelines (DASH stands for Dietary Approaches to Stop Hypertension) Try to limit the sodium in your diet.  Ideally, consume less than 1.5 grams (less than 1,500mg ) per day. Do not add salt when cooking or at the table.  Check the sodium amount on labels when shopping, and choose items lower in sodium when given a choice. Avoid or limit foods that already contain a lot of sodium. Eat a diet rich in fruits and vegetables and whole grains.  Try to use PLAIN allergy medicine without the decongestant Avoid: phenylephrine, phenylpropanolamine, and pseudoephredine  Check out the information at familydoctor.org entitled "What It Takes to Lose Weight" Try to lose between 1-2 pounds per week by taking in fewer calories and burning off more calories You can succeed by limiting portions, limiting foods dense in calories and fat, becoming more active, and drinking 8 glasses of water a day Don't skip meals, especially breakfast, as skipping meals may alter your metabolism Do not use over-the-counter weight loss pills or gimmicks that claim rapid weight loss A healthy BMI (or body mass index) is between 18.5 and 24.9 You can calculate your ideal BMI at the [employer] website ClubMonetize.fr  STOP the lansoprazole START over-the-counter ranitidine, buy 150 mg pills and take TWO at night for the next several days until you get the mail order ranitidine from your mail order pharmacy Avoid triggers like chocolate, spicy foods, tomato sauce, spaghetti sauce, pizza, etc.  DO NOT BUY STREET DRUGS; do NOT buy pain medicine off of the street; it is illegal and extremely dangerous for you to do so; you could die from an accidental overdose; talk with your orthopaedic doctor about options for controlling your knee pain Try turmeric as a natural anti-inflammatory (for pain and arthritis). It comes in  capsules where you buy aspirin and fish oil, but also as a spice where you buy pepper and garlic powder.

## 2015-01-29 NOTE — Assessment & Plan Note (Signed)
Wt loss and low fat diet, check LFTs today

## 2015-01-29 NOTE — Assessment & Plan Note (Signed)
Wt loss is key; talk with surgeon about options for pain control; I advised pt very clearly to NOT buy street drugs; illegal and dangerous; he could die from accidental overdose; I offered help and he declined; Rx given for meloxicam; advise turmeric

## 2015-01-30 ENCOUNTER — Encounter: Payer: Self-pay | Admitting: Family Medicine

## 2015-01-30 LAB — CBC WITH DIFFERENTIAL/PLATELET
BASOS: 0 %
Basophils Absolute: 0 10*3/uL (ref 0.0–0.2)
EOS (ABSOLUTE): 0.2 10*3/uL (ref 0.0–0.4)
EOS: 3 %
HEMATOCRIT: 44.1 % (ref 37.5–51.0)
HEMOGLOBIN: 15 g/dL (ref 12.6–17.7)
IMMATURE GRANS (ABS): 0 10*3/uL (ref 0.0–0.1)
Immature Granulocytes: 0 %
Lymphocytes Absolute: 2.4 10*3/uL (ref 0.7–3.1)
Lymphs: 33 %
MCH: 30.2 pg (ref 26.6–33.0)
MCHC: 34 g/dL (ref 31.5–35.7)
MCV: 89 fL (ref 79–97)
Monocytes Absolute: 0.8 10*3/uL (ref 0.1–0.9)
Monocytes: 12 %
Neutrophils Absolute: 3.7 10*3/uL (ref 1.4–7.0)
Neutrophils: 52 %
Platelets: 203 10*3/uL (ref 150–379)
RBC: 4.97 x10E6/uL (ref 4.14–5.80)
RDW: 15.6 % — ABNORMAL HIGH (ref 12.3–15.4)
WBC: 7.1 10*3/uL (ref 3.4–10.8)

## 2015-01-30 LAB — COMPREHENSIVE METABOLIC PANEL
A/G RATIO: 1.9 (ref 1.1–2.5)
ALK PHOS: 79 IU/L (ref 39–117)
ALT: 59 IU/L — ABNORMAL HIGH (ref 0–44)
AST: 42 IU/L — AB (ref 0–40)
Albumin: 4.6 g/dL (ref 3.5–5.5)
BILIRUBIN TOTAL: 0.4 mg/dL (ref 0.0–1.2)
BUN/Creatinine Ratio: 20 (ref 9–20)
BUN: 21 mg/dL (ref 6–24)
CHLORIDE: 104 mmol/L (ref 97–108)
CO2: 20 mmol/L (ref 18–29)
Calcium: 9.6 mg/dL (ref 8.7–10.2)
Creatinine, Ser: 1.03 mg/dL (ref 0.76–1.27)
GFR, EST AFRICAN AMERICAN: 93 mL/min/{1.73_m2} (ref >59)
GFR, EST NON AFRICAN AMERICAN: 80 mL/min/{1.73_m2} (ref >59)
GLUCOSE: 93 mg/dL (ref 65–99)
Globulin, Total: 2.4 g/dL (ref 1.5–4.5)
Potassium: 4.3 mmol/L (ref 3.5–5.2)
SODIUM: 140 mmol/L (ref 134–144)
TOTAL PROTEIN: 7 g/dL (ref 6.0–8.5)

## 2015-01-30 LAB — TSH: TSH: 2.43 u[IU]/mL (ref 0.450–4.500)

## 2015-02-28 ENCOUNTER — Ambulatory Visit
Admission: EM | Admit: 2015-02-28 | Discharge: 2015-02-28 | Disposition: A | Discharge status: Home / Self Care | Payer: BLUE CROSS/BLUE SHIELD | Attending: Registered Nurse | Admitting: Registered Nurse

## 2015-02-28 ENCOUNTER — Encounter: Payer: Self-pay | Admitting: Emergency Medicine

## 2015-02-28 DIAGNOSIS — L247 Irritant contact dermatitis due to plants, except food: Secondary | ICD-10-CM

## 2015-02-28 MED ORDER — METHYLPREDNISOLONE SODIUM SUCC 125 MG IJ SOLR
125.0000 mg | Freq: Once | INTRAMUSCULAR | Status: AC
  Administered 2015-02-28: 125 mg via INTRAMUSCULAR

## 2015-02-28 MED ORDER — PREDNISONE 20 MG PO TABS: 60.0000 mg | ORAL_TABLET | Freq: Every day | ORAL | Status: DC

## 2015-02-28 MED ORDER — CETIRIZINE HCL 10 MG PO TABS: 10.0000 mg | ORAL_TABLET | Freq: Every day | ORAL | Status: DC

## 2015-02-28 NOTE — ED Provider Notes (Signed)
CSN: [id number]     Arrival date & time 02/28/15  0703 History   None    Chief Complaint  Patient presents with  . Rash   (Consider location/radiation/quality/duration/timing/severity/associated sxs/prior Treatment) HPI Comments: Caucasian male with new rash bilateral arms and abdomen worsening overnight despite showering and left over prescription hydrocortisone has not prevented itching or spreading of rash despite 3 applications.  Has had reoccurent exposures to poison oak and ivy in his yard.  Wore long sleeves and gloves.  Has not tried blocking lotions/creams or soaps specially formulated to remove oils.  Reported sweating overnight but rash not hot to touch.  Localized swelling to rash on arms.  PCM gave him steroid shot last year plus po prednisone and that worked well for him.  Benadryl makes him really sleepy.  Uses flonase and nasal rinses to control seasonal allergic rhinitis.  Patient is a 59 y.o. male presenting with rash. The history is provided by the patient.  Rash Location:  Torso and shoulder/arm Shoulder/arm rash location:  L forearm, R forearm and L upper arm Torso rash location:  Abd LLQ Severity:  Moderate Onset quality:  Sudden Duration:  1 day Timing:  Constant Progression:  Spreading Context: animal contact, medications and plant contact   Context: not chemical exposure, not diapers, not eggs, not exposure to similar rash, not food, not hot tub use, not insect bite/sting, not new detergent/soap, not nuts, not pollen, not pregnancy, not sick contacts and not sun exposure   Relieved by:  Nothing Worsened by:  Continued exposure to allergens and heat Ineffective treatments:  Topical steroids Associated symptoms: no abdominal pain, no diarrhea, no fatigue, no fever, no headaches, no hoarse voice, no induration, no joint pain, no myalgias, no nausea, no periorbital edema, no shortness of breath, no sore throat, no throat swelling, no tongue swelling, no URI, not vomiting  and not wheezing     Past Medical History  Diagnosis Date  . Allergy   . GERD (gastroesophageal reflux disease)   . Hyperlipidemia   . Hypertension    Past Surgical History  Procedure Laterality Date  . Total knee arthroplasty    . Nasal sinus surgery     Family History  Problem Relation Age of Onset  . Cancer Mother     Breast  . Cancer Daughter     pancreatic   History  Substance Use Topics  . Smoking status: Never Smoker   . Smokeless tobacco: Never Used  . Alcohol Use: No    Review of Systems  Constitutional: Positive for diaphoresis. Negative for fever, chills, activity change, appetite change and fatigue.  HENT: Negative for congestion, dental problem, ear discharge, facial swelling, hearing loss, hoarse voice, sore throat, trouble swallowing and voice change.   Eyes: Positive for itching. Negative for photophobia, pain, discharge, redness and visual disturbance.  Respiratory: Negative for cough, choking, shortness of breath, wheezing and stridor.   Cardiovascular: Negative for chest pain, palpitations and leg swelling.  Gastrointestinal: Negative for nausea, vomiting, abdominal pain, diarrhea, constipation, blood in stool and abdominal distention.  Endocrine: Negative for cold intolerance and heat intolerance.  Genitourinary: Negative for dysuria and hematuria.  Musculoskeletal: Negative for myalgias, back pain, joint swelling, arthralgias, gait problem, neck pain and neck stiffness.  Skin: Positive for color change and rash. Negative for pallor and wound.  Allergic/Immunologic: Positive for environmental allergies. Negative for food allergies and immunocompromised state.  Neurological: Negative for dizziness, tremors, seizures, syncope, facial asymmetry, speech difficulty, weakness, light-headedness, numbness  and headaches.  Hematological: Negative for adenopathy. Does not bruise/bleed easily.  Psychiatric/Behavioral: Positive for sleep disturbance. Negative for  behavioral problems, confusion and agitation.    Allergies  Review of patient's allergies indicates no known allergies.  Home Medications   Prior to Admission medications   Medication Sig Start Date End Date Taking? Authorizing Provider  albuterol (PROVENTIL HFA;VENTOLIN HFA) 108 (90 BASE) MCG/ACT inhaler Inhale 2 puffs into the lungs every 6 (six) hours as needed for wheezing or shortness of breath.    Historical Provider, MD  amLODipine (NORVASC) 5 MG tablet Take 1 tablet (5 mg total) by mouth daily. 01/29/15   Arnetha Courser, MD  aspirin 325 MG tablet Take 325 mg by mouth daily.    Historical Provider, MD  atorvastatin (LIPITOR) 40 MG tablet Take 40 mg by mouth daily.    Historical Provider, MD  cetirizine (ZYRTEC) 10 MG tablet Take 1 tablet (10 mg total) by mouth daily. 02/28/15   Olen Cordial, NP  cholecalciferol (VITAMIN D) 1000 UNITS tablet Take 1,000 Units by mouth daily.    Historical Provider, MD  clotrimazole-betamethasone (LOTRISONE) cream Apply 1 application topically 2 (two) times daily.    Historical Provider, MD  diclofenac sodium (VOLTAREN) 1 % GEL Apply topically 4 (four) times daily.    Historical Provider, MD  docusate sodium (COLACE) 100 MG capsule Take 100 mg by mouth. 06/28/14   Historical Provider, MD  fluticasone (FLONASE) 50 MCG/ACT nasal spray Place into both nostrils 2 (two) times daily.    Historical Provider, MD  Ginkgo Biloba 40 MG TABS Take by mouth.    Historical Provider, MD  lisinopril (PRINIVIL,ZESTRIL) 20 MG tablet Take 20 mg by mouth daily.    Historical Provider, MD  meloxicam (MOBIC) 15 MG tablet Take 1 tablet (15 mg total) by mouth daily. 01/29/15   Arnetha Courser, MD  Multiple Vitamins-Minerals (MULTIVITAMIN WITH MINERALS) tablet Take 1 tablet by mouth daily.    Historical Provider, MD  nystatin cream (MYCOSTATIN)  04/17/14   Historical Provider, MD  predniSONE (DELTASONE) 20 MG tablet Take 3 tablets (60 mg total) by mouth daily with breakfast. x3d  then 2 tabs x3d then 1 1/2 tabs x3d then 1 tab x3d then 1/2 tab x4d 02/28/15   Olen Cordial, NP  ranitidine (ZANTAC) 300 MG tablet Take 1 tablet (300 mg total) by mouth at bedtime. 01/29/15   Arnetha Courser, MD  tamsulosin (FLOMAX) 0.4 MG CAPS capsule Take 0.4 mg by mouth 2 (two) times daily.    Historical Provider, MD  valACYclovir (VALTREX) 500 MG tablet Take 500 mg by mouth daily.    Historical Provider, MD   BP 115/80 mmHg  Pulse 67  Temp(Src) 97.6 F (36.4 C) (Tympanic)  Resp 16  Ht 5\' 8"  (1.727 m)  SpO2 98% Physical Exam  Constitutional: He is oriented to person, place, and time. Vital signs are normal. He appears well-developed and well-nourished. No distress.  HENT:  Head: Normocephalic and atraumatic.  Right Ear: External ear normal.  Left Ear: External ear normal.  Nose: Nose normal.  Mouth/Throat: Oropharynx is clear and moist. No oropharyngeal exudate.  Eyes: Conjunctivae, EOM and lids are normal. Pupils are equal, round, and reactive to light. Right eye exhibits no discharge. Left eye exhibits no discharge. No scleral icterus.  Neck: Trachea normal and normal range of motion. Neck supple. No tracheal deviation present. No thyromegaly present.  Cardiovascular: Normal rate, regular rhythm, normal heart sounds and intact distal pulses.  Exam reveals no gallop and no friction rub.   No murmur heard. Pulmonary/Chest: Effort normal and breath sounds normal. No accessory muscle usage or stridor. No respiratory distress. He has no decreased breath sounds. He has no wheezes. He has no rhonchi. He has no rales. He exhibits no tenderness.  Abdominal: Soft. He exhibits no distension. There is no tenderness. There is no guarding.  Musculoskeletal: Normal range of motion. He exhibits no tenderness.  Lymphadenopathy:    He has no cervical adenopathy.  Neurological: He is alert and oriented to person, place, and time. He exhibits normal muscle tone. Coordination normal.  Skin: Skin is  warm, dry and intact. Rash noted. No abrasion, no bruising, no burn, no ecchymosis, no laceration, no lesion, no petechiae and no purpura noted. Rash is macular. Rash is not papular, not maculopapular, not nodular, not pustular, not vesicular and not urticarial. He is not diaphoretic. No cyanosis or erythema. No pallor. Nails show no clubbing.     Psychiatric: He has a normal mood and affect. His speech is normal and behavior is normal. Judgment and thought content normal. Cognition and memory are normal.  Nursing note and vitals reviewed.   ED Course  Procedures (including critical care time) Labs Review Labs Reviewed - No data to display  Imaging Review No results found.  Medications  methylPREDNISolone sodium succinate (SOLU-MEDROL) 125 mg/2 mL injection 125 mg (125 mg Intramuscular Given 02/28/15 0752)  by RN Jackey Loge Vitals:   02/28/15 0813  BP: 115/80  Pulse: 67  Temp:   Resp: 16    MDM   1. Contact dermatitis and eczema due to plant    Medication as directed.  Symptomatic therapy suggested e.g. Calamine lotion, zyrtec 10mg  po qam; benadryl 25-50mg  po bedtime prn breakthrough itching.  Since solumedrol in clinic Start prednisone po taper in 2 days.  May use ice if breakthrough itching.  Warm to cool water soaks and/or oatmeal baths.  Call or return to clinic as needed if these symptoms worsen or fail to improve as anticipated especially lesions noted on eye, visual changes or visual loss.  Exitcare handout on poison ivy, poison oak and cellulitis given to patient.  Discussed avoidance/no contact wear of long sleeves/pants/socks/gloves and handkerchief around neck/mouth/face and use of poison ivy block cream along with tepid shower immediately after completion of yard work.  Avoid scratching lesions to prevent secondary infections.   Patient verbalized agreement and understanding of treatment plan and had no further questions at this time.   P2:  Avoidance and hand  washing.    Olen Cordial, NP 02/28/15 [phone]

## 2015-02-28 NOTE — ED Notes (Signed)
Patient c/o itchy red rash on his arms since yesterday.

## 2015-02-28 NOTE — Discharge Instructions (Signed)
Contact Dermatitis Contact dermatitis is a reaction to certain substances that touch the skin. Contact dermatitis can be either irritant contact dermatitis or allergic contact dermatitis. Irritant contact dermatitis does not require previous exposure to the substance for a reaction to occur.Allergic contact dermatitis only occurs if you have been exposed to the substance before. Upon a repeat exposure, your body reacts to the substance.  CAUSES  Many substances can cause contact dermatitis. Irritant dermatitis is most commonly caused by repeated exposure to mildly irritating substances, such as:  Makeup.  Soaps.  Detergents.  Bleaches.  Acids.  Metal salts, such as nickel. Allergic contact dermatitis is most commonly caused by exposure to:  Poisonous plants.  Chemicals (deodorants, shampoos).  Jewelry.  Latex.  Neomycin in triple antibiotic cream.  Preservatives in products, including clothing. SYMPTOMS  The area of skin that is exposed may develop:  Dryness or flaking.  Redness.  Cracks.  Itching.  Pain or a burning sensation.  Blisters. With allergic contact dermatitis, there may also be swelling in areas such as the eyelids, mouth, or genitals.  DIAGNOSIS  Your caregiver can usually tell what the problem is by doing a physical exam. In cases where the cause is uncertain and an allergic contact dermatitis is suspected, a patch skin test may be performed to help determine the cause of your dermatitis. TREATMENT Treatment includes protecting the skin from further contact with the irritating substance by avoiding that substance if possible. Barrier creams, powders, and gloves may be helpful. Your caregiver may also recommend:  Steroid creams or ointments applied 2 times daily. For best results, soak the rash area in cool water for 20 minutes. Then apply the medicine. Cover the area with a plastic wrap. You can store the steroid cream in the refrigerator for a "chilly"  effect on your rash. That may decrease itching. Oral steroid medicines may be needed in more severe cases.  Antibiotics or antibacterial ointments if a skin infection is present.  Antihistamine lotion or an antihistamine taken by mouth to ease itching.  Lubricants to keep moisture in your skin.  Burow's solution to reduce redness and soreness or to dry a weeping rash. Mix one packet or tablet of solution in 2 cups cool water. Dip a clean washcloth in the mixture, wring it out a bit, and put it on the affected area. Leave the cloth in place for 30 minutes. Do this as often as possible throughout the day.  Taking several cornstarch or baking soda baths daily if the area is too large to cover with a washcloth. Harsh chemicals, such as alkalis or acids, can cause skin damage that is like a burn. You should flush your skin for 15 to 20 minutes with cold water after such an exposure. You should also seek immediate medical care after exposure. Bandages (dressings), antibiotics, and pain medicine may be needed for severely irritated skin.  HOME CARE INSTRUCTIONS  Avoid the substance that caused your reaction.  Keep the area of skin that is affected away from hot water, soap, sunlight, chemicals, acidic substances, or anything else that would irritate your skin.  Do not scratch the rash. Scratching may cause the rash to become infected.  You may take cool baths to help stop the itching.  Only take over-the-counter or prescription medicines as directed by your caregiver.  See your caregiver for follow-up care as directed to make sure your skin is healing properly. SEEK MEDICAL CARE IF:   Your condition is not better after 3  days of treatment.  You seem to be getting worse.  You see signs of infection such as swelling, tenderness, redness, soreness, or warmth in the affected area.  You have any problems related to your medicines. Document Released: 07/30/2000 Document Revised: 10/25/2011  Document Reviewed: 01/05/2011 [facility] Patient Information 2015 [place], [place]. This information is not intended to replace advice given to you by your health care provider. Make sure you discuss any questions you have with your health care provider.  Poison [employer] ivy is a inflammation of the skin (contact dermatitis) caused by touching the allergens on the leaves of the ivy plant following previous exposure to the plant. The rash usually appears 48 hours after exposure. The rash is usually bumps (papules) or blisters (vesicles) in a linear pattern. Depending on your own sensitivity, the rash may simply cause redness and itching, or it may also progress to blisters which may break open. These must be well cared for to prevent secondary bacterial (germ) infection, followed by scarring. Keep any open areas dry, clean, dressed, and covered with an antibacterial ointment if needed. The eyes may also get puffy. The puffiness is worst in the morning and gets better as the day progresses. This dermatitis usually heals without scarring, within 2 to 3 weeks without treatment. HOME CARE INSTRUCTIONS  Thoroughly wash with soap and water as soon as you have been exposed to poison ivy. You have about one half hour to remove the plant resin before it will cause the rash. This washing will destroy the oil or antigen on the skin that is causing, or will cause, the rash. Be sure to wash under your fingernails as any plant resin there will continue to spread the rash. Do not rub skin vigorously when washing affected area. Poison ivy cannot spread if no oil from the plant remains on your body. A rash that has progressed to weeping sores will not spread the rash unless you have not washed thoroughly. It is also important to wash any clothes you have been wearing as these may carry active allergens. The rash will return if you wear the unwashed clothing, even several days later. Avoidance of the plant in the future is the  best measure. Poison ivy plant can be recognized by the number of leaves. Generally, poison ivy has three leaves with flowering branches on a single stem. Diphenhydramine may be purchased over the counter and used as needed for itching. Do not drive with this medication if it makes you drowsy.Ask your caregiver about medication for children. SEEK MEDICAL CARE IF:  Open sores develop.  Redness spreads beyond area of rash.  You notice purulent (pus-like) discharge.  You have increased pain.  Other signs of infection develop (such as fever). Document Released: 07/30/2000 Document Revised: 10/25/2011 Document Reviewed: 01/10/2009 [facility] Patient Information 2015 [place], [place]. This information is not intended to replace advice given to you by your health care provider. Make sure you discuss any questions you have with your health care provider.  Poison [facility] is an inflammation of the skin (contact dermatitis). It is caused by contact with the allergens on the leaves of the oak (toxicodendron) plants. Depending on your sensitivity, the rash may consist simply of redness and itching, or it may also progress to blisters which may break open (rupture). These must be well cared for to prevent secondary germ (bacterial) infection as these infections can lead to scarring. The eyes may also get puffy. The puffiness is worst in the morning  and gets better as the day progresses. Healing is best accomplished by keeping any open areas dry, clean, covered with a bandage, and covered with an antibacterial ointment if needed. Without secondary infection, this dermatitis usually heals without scarring within 2 to 3 weeks without treatment. HOME CARE INSTRUCTIONS When you have been exposed to poison oak, it is very important to thoroughly wash with soap and water as soon as the exposure has been discovered. You have about one half hour to remove the plant resin before it will cause the rash. This cleaning  will quickly destroy the oil or antigen on the skin (the antigen is what causes the rash). Wash aggressively under the fingernails as any plant resin still there will continue to spread the rash. Do not rub skin vigorously when washing affected area. Poison oak cannot spread if no oil from the plant remains on your body. Rash that has progressed to weeping sores (lesions) will not spread the rash unless you have not washed thoroughly. It is also important to clean any clothes you have been wearing as they may carry active allergens which will spread the rash, even several days later. Avoidance of the plant in the future is the best measure. Poison oak plants can be recognized by the number of leaves. Generally, poison oak has three leaves with flowering branches on a single stem. Diphenhydramine may be purchased over the counter and used as needed for itching. Do not drive with this medication if it makes you drowsy. Ask your caregiver about medication for children. SEEK IMMEDIATE MEDICAL CARE IF:   Open areas of the rash develop.  You notice redness extending beyond the area of the rash.  There is a pus like discharge.  There is increased pain.  Other signs of infection develop (such as fever). Document Released: 02/06/2003 Document Revised: 10/25/2011 Document Reviewed: 06/18/2009 [facility] Patient Information 2015 [place], [place]. This information is not intended to replace advice given to you by your health care provider. Make sure you discuss any questions you have with your health care provider. Cellulitis Cellulitis is an infection of the skin and the tissue beneath it. The infected area is usually red and tender. Cellulitis occurs most often in the arms and lower legs.  CAUSES  Cellulitis is caused by bacteria that enter the skin through cracks or cuts in the skin. The most common types of bacteria that cause cellulitis are staphylococci and streptococci. SIGNS AND SYMPTOMS  Redness and  warmth. Swelling. Tenderness or pain. Fever. DIAGNOSIS  Your health care provider can usually determine what is wrong based on a physical exam. Blood tests may also be done. TREATMENT  Treatment usually involves taking an antibiotic medicine. HOME CARE INSTRUCTIONS  Take your antibiotic medicine as directed by your health care provider. Finish the antibiotic even if you start to feel better. Keep the infected arm or leg elevated to reduce swelling. Apply a warm cloth to the affected area up to 4 times per day to relieve pain. Take medicines only as directed by your health care provider. Keep all follow-up visits as directed by your health care provider. SEEK MEDICAL CARE IF:  You notice red streaks coming from the infected area. Your red area gets larger or turns dark in color. Your bone or joint underneath the infected area becomes painful after the skin has healed. Your infection returns in the same area or another area. You notice a swollen bump in the infected area. You develop new symptoms. You have a  fever. SEEK IMMEDIATE MEDICAL CARE IF:  You feel very sleepy. You develop vomiting or diarrhea. You have a general ill feeling (malaise) with muscle aches and pains. MAKE SURE YOU:  Understand these instructions. Will watch your condition. Will get help right away if you are not doing well or get worse. Document Released: 05/12/2005 Document Revised: 12/17/2013 Document Reviewed: 10/18/2011 [facility] Patient Information 2015 [place], [place]. This information is not intended to replace advice given to you by your health care provider. Make sure you discuss any questions you have with your health care provider.

## 2015-04-23 ENCOUNTER — Other Ambulatory Visit: Payer: Self-pay | Admitting: Family Medicine

## 2015-04-23 NOTE — Telephone Encounter (Signed)
Routing to provider  

## 2015-05-01 ENCOUNTER — Ambulatory Visit: Payer: BLUE CROSS/BLUE SHIELD | Admitting: Family Medicine

## 2015-05-05 ENCOUNTER — Ambulatory Visit: Payer: BLUE CROSS/BLUE SHIELD | Admitting: Family Medicine

## 2015-05-12 ENCOUNTER — Encounter: Payer: Self-pay | Admitting: Family Medicine

## 2015-05-12 ENCOUNTER — Ambulatory Visit (INDEPENDENT_AMBULATORY_CARE_PROVIDER_SITE_OTHER): Payer: BLUE CROSS/BLUE SHIELD | Admitting: Family Medicine

## 2015-05-12 VITALS — BP 119/76 | HR 71 | Temp 97.1°F | Ht 66.5 in | Wt 258.0 lb

## 2015-05-12 DIAGNOSIS — Z23 Encounter for immunization: Secondary | ICD-10-CM | POA: Diagnosis not present

## 2015-05-12 DIAGNOSIS — E785 Hyperlipidemia, unspecified: Secondary | ICD-10-CM | POA: Diagnosis not present

## 2015-05-12 DIAGNOSIS — R7301 Impaired fasting glucose: Secondary | ICD-10-CM | POA: Diagnosis not present

## 2015-05-12 DIAGNOSIS — E559 Vitamin D deficiency, unspecified: Secondary | ICD-10-CM | POA: Diagnosis not present

## 2015-05-12 DIAGNOSIS — R5383 Other fatigue: Secondary | ICD-10-CM | POA: Diagnosis not present

## 2015-05-12 DIAGNOSIS — R1310 Dysphagia, unspecified: Secondary | ICD-10-CM

## 2015-05-12 DIAGNOSIS — R7401 Elevation of levels of liver transaminase levels: Secondary | ICD-10-CM

## 2015-05-12 DIAGNOSIS — R740 Nonspecific elevation of levels of transaminase and lactic acid dehydrogenase [LDH]: Secondary | ICD-10-CM

## 2015-05-12 DIAGNOSIS — K219 Gastro-esophageal reflux disease without esophagitis: Secondary | ICD-10-CM | POA: Diagnosis not present

## 2015-05-12 DIAGNOSIS — K921 Melena: Secondary | ICD-10-CM

## 2015-05-12 MED ORDER — OMEPRAZOLE 20 MG PO CPDR: 20.0000 mg | DELAYED_RELEASE_CAPSULE | Freq: Every day | ORAL | Status: DC

## 2015-05-12 NOTE — Assessment & Plan Note (Signed)
Refer to GI for evaluation, consideration of an EGD; avoid triggers

## 2015-05-12 NOTE — Assessment & Plan Note (Signed)
He never took the cholesterol medicine (his own choice); limit saturated fats like bacon and sausage and cheese

## 2015-05-12 NOTE — Assessment & Plan Note (Signed)
Limit tylenol to no more than 2000 mg a day; check SGPT

## 2015-05-12 NOTE — Assessment & Plan Note (Signed)
Check level today 

## 2015-05-12 NOTE — Assessment & Plan Note (Addendum)
Check A1C every 6 months to make sure not trying to go over to frank type 2 diabetes; last was normal in June

## 2015-05-12 NOTE — Assessment & Plan Note (Addendum)
Avoid trigger foods; work on weight loss; refer to GI; patient says he cannot elevate HOB;

## 2015-05-12 NOTE — Assessment & Plan Note (Signed)
Explained BMI is now 66, increased risk of heart attack and stroke, as well as other problems; really try to limit saturated fats, portions, fried foods, fast foods, etc.

## 2015-05-12 NOTE — Patient Instructions (Addendum)
We'll have you see the gastroenterologist Add omeprazole once a day Continue the ranitidine Avoid trigger foods like coffee, peppermint, spicy foods, orange juice, tomato-based foods, caffeinated drinks, onions, etc. Work on weight loss by decreasing portions, avoid sugary drinks, fried foods, and try to eat healthier Return in 3 months for next visit and labs then (do come fasting)  Cholesterol Cholesterol is a fat. Your body needs a small amount of cholesterol. Cholesterol may build up in your blood vessels. This increases your chance of having a heart attack or stroke. You cannot feel your cholesterol levels. The only way to know your cholesterol level is high is with a blood test. Keep your test results. Work with your doctor to keep your cholesterol at a good level. WHAT DO THE TEST RESULTS MEAN?  Total cholesterol is how much cholesterol is in your blood.  LDL is bad cholesterol. This is the type that can build up. You want LDL to be low.  HDL is good cholesterol. It cleans your blood vessels and carries LDL away. You want HDL to be high.  Triglycerides are fat that the body can burn for energy or store. WHAT ARE GOOD LEVELS OF CHOLESTEROL?  Total cholesterol below 200.  LDL below 100 for people at risk. Below 70 for those at very high risk.  HDL above 50 is good. Above 60 is best.  Triglycerides below 150. HOW CAN I LOWER MY CHOLESTEROL?  Diet. Follow your diet programs as told by your doctor.  Choose fish, white meat chicken, roasted [place], or baked [place]. Try not to eat red meat, fried foods, or processed meats such as sausage and lunch meats.  Eat lots of fresh fruits and vegetables.  Choose whole grains, beans, pasta, potatoes, and cereals.  Use only small amounts of olive, corn, or canola oils.  Try not to eat butter, mayonnaise, shortening, or palm kernel oils.  Try not to eat foods with trans fats.  Drink skim or nonfat milk. Eat low-fat or nonfat yogurt  and cheeses. Try not to drink whole milk or cream. Try not to eat ice cream, egg yolks, and full-fat cheeses.  Healthy desserts include angel food cake, ginger snaps, animal crackers, hard candy, popsicles, and low-fat or nonfat frozen yogurt. Try not to eat pastries, cakes, pies, and cookies.  Exercise. Follow your exercise programs as told by your doctor.  Be more active. You can try gardening, walking, or taking the stairs. Ask your doctor about how you can be more active.  Medicine. Take medicine as told by your doctor. Document Released: 10/29/2008 Document Revised: 12/17/2013 Document Reviewed: 05/16/2013 [facility] Patient Information 2015 [place], [place]. This information is not intended to replace advice given to you by your health care provider. Make sure you discuss any questions you have with your health care provider. Gastroesophageal Reflux Disease, Adult Gastroesophageal reflux disease (GERD) happens when acid from your stomach goes into your food pipe (esophagus). The acid can cause a burning feeling in your chest. Over time, the acid can make small holes (ulcers) in your food pipe.  HOME CARE  Ask your doctor for advice about:  Losing weight.  Quitting smoking.  Alcohol use.  Avoid foods and drinks that make your problems worse. You may want to avoid:  Caffeine and alcohol.  Chocolate.  Mints.  Garlic and onions.  Spicy foods.  Citrus fruits, such as oranges, lemons, or limes.  Foods that contain tomato, such as sauce, chili, salsa, and pizza.  Fried and fatty  foods.  Avoid lying down for 3 hours before you go to bed or before you take a nap.  Eat small meals often, instead of large meals.  Wear loose-fitting clothing. Do not wear anything tight around your waist.  Raise (elevate) the head of your bed 6 to 8 inches with wood blocks. Using extra pillows does not help.  Only take medicines as told by your doctor.  Do not take aspirin or ibuprofen. GET  HELP RIGHT AWAY IF:   You have pain in your arms, neck, jaw, teeth, or back.  Your pain gets worse or changes.  You feel sick to your stomach (nauseous), throw up (vomit), or sweat (diaphoresis).  You feel short of breath, or you pass out (faint).  Your throw up is green, yellow, black, or looks like coffee grounds or blood.  Your poop (stool) is red, bloody, or black. MAKE SURE YOU:   Understand these instructions.  Will watch your condition.  Will get help right away if you are not doing well or get worse. Document Released: 01/19/2008 Document Revised: 10/25/2011 Document Reviewed: 02/19/2011 [facility] Patient Information 2015 [place], [place]. This information is not intended to replace advice given to you by your health care provider. Make sure you discuss any questions you have with your health care provider. Obesity Obesity is having too much body fat and a body mass index (BMI) of 30 or more. BMI is a number based on your height and weight. The number is an estimate of how much body fat you have. Obesity can happen if you eat more calories than you can burn by exercising or other activity. It can cause major health problems or emergencies.  HOME CARE  Exercise and be active as told by your doctor. Try:  Using stairs when you can.  Parking farther away from store doors.  Gardening, biking, or walking.  Eat healthy foods and drinks that are low in calories. Eat more fruits and vegetables.  Limit fast food, sweets, and snack foods that are made with ingredients that are not natural (processed food).  Eat smaller amounts of food.  Keep a journal and write down what you eat every day. Websites can help with this.  Avoid drinking alcohol. Drink more water and drinks without calories.   Take vitamins and dietary pills (supplements) only as told by your doctor.  Try going to weight-loss support groups or classes to help lessen stress. Dietitians and counselors may also  help. GET HELP RIGHT AWAY IF:  You have chest pain or tightness.  You have trouble breathing or feel short of breath.  You feel weak or have loss of feeling (numbness) in your legs.  You feel confused or have trouble talking.  You have sudden changes in your vision. MAKE SURE YOU:  Understand these instructions.  Will watch your condition.  Will get help right away if you are not doing well or get worse. Document Released: 10/25/2011 Document Revised: 12/17/2013 Document Reviewed: 10/25/2011 [facility] Patient Information 2015 [place], [place]. This information is not intended to replace advice given to you by your health care provider. Make sure you discuss any questions you have with your health care provider.

## 2015-05-12 NOTE — Assessment & Plan Note (Signed)
With weight gain, recheck TSH; last CBC was normal

## 2015-05-12 NOTE — Progress Notes (Signed)
BP 119/76 mmHg  Pulse 71  Temp(Src) 97.1 F (36.2 C)  Ht 5' 6.5" (1.689 m)  Wt 258 lb (117.028 kg)  BMI 41.02 kg/m2  SpO2 97%   Subjective:    Patient ID: Derrick Ball., male    DOB: 1956/01/19, 59 y.o.   MRN: [id number]  HPI: Derrick Argabright. is a 59 y.o. male  Chief Complaint  Patient presents with  . Hypertension  . Hyperlipidemia  . Gastrophageal Reflux    says Ratinidine isn't helping much, he states he's cutting them in half and taking a half tablet twice a day.  . Fatigue    Just feels run down, congested in head, wonders if he's sick   I asked him what he is doing to make himself healthier; he says nothing He is waiting to hear from his disability, going to have a hearing soon; he can't stand up much he says; an orthopaedic issue; the orthopaedist won't back him with it; he had the right knee replaced, going to have left knee replaced as soon as daughter gets through with chemo  His blood pressure is well-controlled today; he does not check it away from the doctor; he does not add salt to food; he just eats "normal" just does not add salt; has some eggs and bacon and sausage for breakfast or toast or fruit or pancakes; double cheeseburger from [employer] last night, split and order of french fries; does eat more cheese than anything, but not a whole lot either; tries to get fruits and veggies  He has obesity; he is working on weight loss "somewhat"; he is not doing a whole lot to lose weight he says; he does not do any exercise, "ain't no way"; not trying to cut back on portions really; poor appetite just lately, upper respiratory with tightness and congestion; blowing out a lot of store, coughing more, blowing more stuff out; using nasal rinse; still feels congested; used albuterol yesterday; no pain, just congestion; ears are bothering him; no fevers; no rash  GERD; taking ranitidine, still having regurg sometimes; feels hard to swallow sometimes but things get  stuck, solids only; "liquids go down good"; he has not seen GI; no blood in stool recently;he upped his colace to twice a day; some constipation  Relevant past medical, surgical, family and social history reviewed and updated as indicated. Interim medical history since our last visit reviewed. Allergies and medications reviewed and updated.  Review of Systems Per HPI unless specifically indicated above     Objective:    BP 119/76 mmHg  Pulse 71  Temp(Src) 97.1 F (36.2 C)  Ht 5' 6.5" (1.689 m)  Wt 258 lb (117.028 kg)  BMI 41.02 kg/m2  SpO2 97%  Wt Readings from Last 3 Encounters:  05/12/15 258 lb (117.028 kg)  01/29/15 260 lb (117.935 kg)  05/08/14 245 lb (111.131 kg)    Physical Exam  Constitutional: He appears well-developed and well-nourished. No distress.  HENT:  Head: Normocephalic and atraumatic.  Right Ear: Hearing, tympanic membrane, external ear and ear canal normal.  Left Ear: Hearing, tympanic membrane, external ear and ear canal normal.  Nose: No rhinorrhea.  Mouth/Throat: Oropharynx is clear and moist and mucous membranes are normal.  Eyes: EOM are normal. No scleral icterus.  Neck: No thyromegaly present.  Cardiovascular: Normal rate and regular rhythm.   Pulmonary/Chest: Effort normal and breath sounds normal.  Abdominal: Soft. Bowel sounds are normal. He exhibits no distension.  obese  Musculoskeletal:  He exhibits no edema.  Neurological: Coordination normal.  Skin: Skin is warm and dry. No pallor.  Psychiatric: He has a normal mood and affect. His behavior is normal. Judgment and thought content normal.    Results for orders placed or performed in visit on 01/29/15  CBC with Differential/Platelet  Result Value Ref Range   WBC 7.1 3.4 - 10.8 x10E3/uL   RBC 4.97 4.14 - 5.80 x10E6/uL   Hemoglobin 15.0 12.6 - 17.7 g/dL   Hematocrit 44.1 37.5 - 51.0 %   MCV 89 79 - 97 fL   MCH 30.2 26.6 - 33.0 pg   MCHC 34.0 31.5 - 35.7 g/dL   RDW 15.6 (H) 12.3 - 15.4  %   Platelets 203 150 - 379 x10E3/uL   Neutrophils 52 %   Lymphs 33 %   Monocytes 12 %   Eos 3 %   Basos 0 %   Neutrophils Absolute 3.7 1.4 - 7.0 x10E3/uL   Lymphocytes Absolute 2.4 0.7 - 3.1 x10E3/uL   Monocytes Absolute 0.8 0.1 - 0.9 x10E3/uL   EOS (ABSOLUTE) 0.2 0.0 - 0.4 x10E3/uL   Basophils Absolute 0.0 0.0 - 0.2 x10E3/uL   Immature Granulocytes 0 %   Immature Grans (Abs) 0.0 0.0 - 0.1 x10E3/uL  Comprehensive metabolic panel  Result Value Ref Range   Glucose 93 65 - 99 mg/dL   BUN 21 6 - 24 mg/dL   Creatinine, Ser 1.03 0.76 - 1.27 mg/dL   GFR calc non Af Amer 80 >59 mL/min/1.73   GFR calc Af Amer 93 >59 mL/min/1.73   BUN/Creatinine Ratio 20 9 - 20   Sodium 140 134 - 144 mmol/L   Potassium 4.3 3.5 - 5.2 mmol/L   Chloride 104 97 - 108 mmol/L   CO2 20 18 - 29 mmol/L   Calcium 9.6 8.7 - 10.2 mg/dL   Total Protein 7.0 6.0 - 8.5 g/dL   Albumin 4.6 3.5 - 5.5 g/dL   Globulin, Total 2.4 1.5 - 4.5 g/dL   Albumin/Globulin Ratio 1.9 1.1 - 2.5   Bilirubin Total 0.4 0.0 - 1.2 mg/dL   Alkaline Phosphatase 79 39 - 117 IU/L   AST 42 (H) 0 - 40 IU/L   ALT 59 (H) 0 - 44 IU/L  TSH  Result Value Ref Range   TSH 2.430 0.450 - 4.500 uIU/mL  Lipid Panel Piccolo, Waived  Result Value Ref Range   Cholesterol Piccolo, Waived 164 <200 mg/dL   HDL Chol Piccolo, Waived 46 (L) >59 mg/dL   Triglycerides Piccolo,Waived 109 <150 mg/dL   Chol/HDL Ratio Piccolo,Waive 3.6 mg/dL   LDL Chol Calc Piccolo Waived 97 <100 mg/dL   VLDL Chol Calc Piccolo,Waive 22 <30 mg/dL  Bayer DCA Hb A1c Waived  Result Value Ref Range   Bayer DCA Hb A1c Waived 5.4 <7.0 %      Assessment & Plan:   Problem List Items Addressed This Visit      Digestive   GERD (gastroesophageal reflux disease)    Avoid trigger foods; work on weight loss; refer to GI; patient says he cannot elevate HOB;       Relevant Medications   omeprazole (PRILOSEC) 20 MG capsule   Other Relevant Orders   Ambulatory referral to  Gastroenterology   Dysphagia - Primary    Refer to GI for evaluation, consideration of an EGD; avoid triggers      Relevant Orders   Ambulatory referral to Gastroenterology     Endocrine   IFG (impaired  fasting glucose)    Check A1C every 6 months to make sure not trying to go over to frank type 2 diabetes; last was normal in June        Other   Morbid obesity    Explained BMI is now 62, increased risk of heart attack and stroke, as well as other problems; really try to limit saturated fats, portions, fried foods, fast foods, etc.      Relevant Orders   TSH   Elevated transaminase level    Limit tylenol to no more than 2000 mg a day; check SGPT      Relevant Orders   Comprehensive metabolic panel   Hyperlipidemia    He never took the cholesterol medicine (his own choice); limit saturated fats like bacon and sausage and cheese      Relevant Medications   aspirin EC 81 MG tablet   Fatigue    With weight gain, recheck TSH; last CBC was normal      Relevant Orders   TSH   Vitamin D deficiency    Check level today      Relevant Orders   Vit D  25 hydroxy (rtn osteoporosis monitoring)   Blood in the stool   Relevant Orders   Ambulatory referral to Gastroenterology    Other Visit Diagnoses    Encounter for immunization            Follow up plan: Return in about 3 months (around 08/11/2015) for visit and fasting labs.  An after-visit summary was printed and given to the patient at [place].  Please see the patient instructions which may contain other information and recommendations beyond what is mentioned above in the assessment and plan. Orders Placed This Encounter  Procedures  . Flu Vaccine QUAD 36+ mos IM  . Comprehensive metabolic panel  . Vit D  25 hydroxy (rtn osteoporosis monitoring)  . TSH  . Ambulatory referral to Gastroenterology

## 2015-05-13 ENCOUNTER — Encounter: Payer: Self-pay | Admitting: Family Medicine

## 2015-05-13 LAB — COMPREHENSIVE METABOLIC PANEL
A/G RATIO: 2 (ref 1.1–2.5)
ALBUMIN: 4.4 g/dL (ref 3.5–5.5)
ALT: 40 IU/L (ref 0–44)
AST: 32 IU/L (ref 0–40)
Alkaline Phosphatase: 64 IU/L (ref 39–117)
BILIRUBIN TOTAL: 0.2 mg/dL (ref 0.0–1.2)
BUN / CREAT RATIO: 23 — AB (ref 9–20)
BUN: 22 mg/dL (ref 6–24)
CALCIUM: 9.3 mg/dL (ref 8.7–10.2)
CHLORIDE: 104 mmol/L (ref 97–108)
CO2: 20 mmol/L (ref 18–29)
Creatinine, Ser: 0.95 mg/dL (ref 0.76–1.27)
GFR, EST AFRICAN AMERICAN: 102 mL/min/{1.73_m2} (ref >59)
GFR, EST NON AFRICAN AMERICAN: 88 mL/min/{1.73_m2} (ref >59)
GLOBULIN, TOTAL: 2.2 g/dL (ref 1.5–4.5)
Glucose: 102 mg/dL — ABNORMAL HIGH (ref 65–99)
POTASSIUM: 4.1 mmol/L (ref 3.5–5.2)
Sodium: 143 mmol/L (ref 134–144)
TOTAL PROTEIN: 6.6 g/dL (ref 6.0–8.5)

## 2015-05-13 LAB — TSH: TSH: 4.1 u[IU]/mL (ref 0.450–4.500)

## 2015-05-13 LAB — VITAMIN D 25 HYDROXY (VIT D DEFICIENCY, FRACTURES): Vit D, 25-Hydroxy: 30.5 ng/mL (ref 30.0–100.0)

## 2015-06-09 ENCOUNTER — Other Ambulatory Visit: Payer: Self-pay | Admitting: Family Medicine

## 2015-06-09 MED ORDER — VALACYCLOVIR HCL 500 MG PO TABS: ORAL_TABLET | ORAL | Status: DC

## 2015-06-09 MED ORDER — AMLODIPINE BESYLATE 5 MG PO TABS: 5.0000 mg | ORAL_TABLET | Freq: Every day | ORAL | Status: DC

## 2015-06-09 MED ORDER — MELOXICAM 15 MG PO TABS: 15.0000 mg | ORAL_TABLET | Freq: Every day | ORAL | Status: DC | PRN

## 2015-06-09 MED ORDER — RANITIDINE HCL 300 MG PO TABS: 300.0000 mg | ORAL_TABLET | Freq: Every day | ORAL | Status: DC

## 2015-06-09 NOTE — Telephone Encounter (Signed)
He needs refills on all of them except for the Lisinopril. That rx automatically added in for some reason.

## 2015-06-09 NOTE — Telephone Encounter (Signed)
rxs approved 

## 2015-06-09 NOTE — Telephone Encounter (Signed)
Rx fax for valacyclovir hcl tab came in at 12:30 pm this afternoon too.

## 2015-06-11 ENCOUNTER — Telehealth: Payer: Self-pay | Admitting: Family Medicine

## 2015-06-11 MED ORDER — LISINOPRIL 20 MG PO TABS: 20.0000 mg | ORAL_TABLET | Freq: Every day | ORAL | Status: DC

## 2015-06-11 NOTE — Telephone Encounter (Signed)
Dr. Sanda Klein, it is still in his med list and I did not see anything in your last note stating to stop it. It looks like he should need a refill.

## 2015-06-11 NOTE — Telephone Encounter (Signed)
Pt has questions about if he is to continue taking his Lisinopril. Please call pt to advise.

## 2015-06-11 NOTE — Telephone Encounter (Signed)
Reviewed previous note; he did need the lisinopril after all; I was told last refill request that he did not need lisinopril; I told him I would take care of it right now

## 2015-08-04 ENCOUNTER — Ambulatory Visit (INDEPENDENT_AMBULATORY_CARE_PROVIDER_SITE_OTHER): Payer: BLUE CROSS/BLUE SHIELD | Admitting: Family Medicine

## 2015-08-04 ENCOUNTER — Encounter: Payer: Self-pay | Admitting: Family Medicine

## 2015-08-04 VITALS — BP 136/84 | HR 77 | Temp 97.4°F | Wt 260.0 lb

## 2015-08-04 DIAGNOSIS — R5383 Other fatigue: Secondary | ICD-10-CM | POA: Diagnosis not present

## 2015-08-04 DIAGNOSIS — L609 Nail disorder, unspecified: Secondary | ICD-10-CM | POA: Diagnosis not present

## 2015-08-04 DIAGNOSIS — R413 Other amnesia: Secondary | ICD-10-CM | POA: Insufficient documentation

## 2015-08-04 MED ORDER — AMOXICILLIN-POT CLAVULANATE 875-125 MG PO TABS: 1.0000 | ORAL_TABLET | Freq: Two times a day (BID) | ORAL | Status: AC

## 2015-08-04 NOTE — Progress Notes (Signed)
BP 136/84 mmHg  Pulse 77  Temp(Src) 97.4 F (36.3 C)  Wt 260 lb (117.935 kg)  SpO2 98%   Subjective:    Patient ID: Derrick Sandoval., male    DOB: 21-Mar-1956, 59 y.o.   MRN: [id number]  HPI: Derrick Sandoval. is a 59 y.o. male  Chief Complaint  Patient presents with  . Memory Loss    for about a year or so, has been trying the ginko biloba, but that doesn't seem to be helping much. Has been having some headaches  . Nasal Congestion    head and chest congestion x 3-4 months   He is here for evaluation, been having trouble with short-term memory loss;  He can go to the kitchen and pour a glass of milk and leave it there Comes into the computer room and forgets why he went into the room Can recall things from childhood, that long-term memory is okay Having trouble with people's names and sometimes with naming things and places Speech makes sense No slurring of words No weakness in arms; he does not have weakness in the muscles of the legs; left knee is going to be replaced in January, from the arthritis he says; no problems with dizziness  He does have congestion; his pillow has all kind of spots from where he can't breathe; blows his head every morning, every single morning; he is blowing out blood and green stuff; something black came out the other day; ears are stopped up and muffled and ringing; no real sore throat; no rash; used saline solution, fluticasone; had nose surgery, Dr. Juan Quam; surgery was 3-4 years ago; last check everything was fine; no fevers  Abnormal appearance to right thumb noticed; 3 weeks maybe; no recollection of trauma  He is obese; he has not lost any weight since June, stable; he isn't really able to exercise; he has tried cutting back on calories  Not checking BP; taking medicines every day  Relevant past medical, surgical, family and social history reviewed and updated as indicated No thyroid problems in the family; no diabets in the  family; no dementia in the family  Interim medical history since our last visit reviewed. Allergies and medications reviewed and updated.  Review of Systems  Constitutional: Positive for fatigue. Negative for fever and unexpected weight change.  HENT: Positive for congestion, hearing loss (some muffled sound), nosebleeds, sinus pressure (over both cheeks R=L) and sneezing. Negative for facial swelling, mouth sores, sore throat, trouble swallowing and voice change.   does have some acne Per HPI unless specifically indicated above     Objective:    BP 136/84 mmHg  Pulse 77  Temp(Src) 97.4 F (36.3 C)  Wt 260 lb (117.935 kg)  SpO2 98%  Wt Readings from Last 3 Encounters:  08/04/15 260 lb (117.935 kg)  05/12/15 258 lb (117.028 kg)  01/29/15 260 lb (117.935 kg)   body mass index is 41.34 kg/(m^2).  Physical Exam  Constitutional: He appears well-developed and well-nourished. No distress.  HENT:  Head: Normocephalic and atraumatic.  Nose: Mucosal edema and rhinorrhea present. Right sinus exhibits maxillary sinus tenderness. Right sinus exhibits no frontal sinus tenderness. Left sinus exhibits no maxillary sinus tenderness and no frontal sinus tenderness.  Mouth/Throat: Oropharynx is clear and moist and mucous membranes are normal. No oropharyngeal exudate, posterior oropharyngeal edema or posterior oropharyngeal erythema.  Yellowish material in both nares  Eyes: EOM are normal. No scleral icterus.  Neck: No thyromegaly present.  Cardiovascular: Normal rate and regular rhythm.   Pulmonary/Chest: Effort normal and breath sounds normal.  Abdominal: Soft. Bowel sounds are normal. He exhibits no distension.  Musculoskeletal: He exhibits no edema.  Lymphadenopathy:    He has no cervical adenopathy.  Neurological: He is alert. He displays no atrophy and no tremor. Coordination normal.  Skin: Skin is warm and dry. No pallor.  There is violaceous to purplish-black color change to the base  of the right thumb nail; color change extends distally suggestive of presence for at least one month, but color still evidence at base  Psychiatric: He has a normal mood and affect. His behavior is normal. Judgment and thought content normal. He exhibits normal recent memory and normal remote memory.  No obvious examples today of memory impairment noted by examiner      Assessment & Plan:   Problem List Items Addressed This Visit      Musculoskeletal and Integument   Nail abnormality    Concerning in that patient has no recollection of trauma; while the color looks like it could be subungual hematoma, have to consider malignant melanoma given that it appears to still be growing out from the base of the nail; refer to derm      Relevant Orders   Ambulatory referral to Dermatology     Other   Morbid obesity ([place])    See AVS; refer to nutritionist and return in one month      Relevant Orders   Amb ref to Medical Nutrition Therapy-MNT   Fatigue    Check thyroid and B12; last D was borderline      Relevant Orders   T3, free (Completed)   T4, free (Completed)   VITAMIN D 25 Hydroxy (Vit-D Deficiency, Fractures) (Completed)   Vitamin B12 (Completed)   TSH (Completed)   Short-term memory loss - Primary    Check thyroid, vitamin levels; no reason to check for syphilis per patient; refer to neurologist      Relevant Orders   Ambulatory referral to Neurology      Follow up plan: Return in about 1 month (around 09/04/2015) for weight, obesity.  An after-visit summary was printed and given to the patient at [place].  Please see the patient instructions which may contain other information and recommendations beyond what is mentioned above in the assessment and plan. Orders Placed This Encounter  Procedures  . T3, free  . T4, free  . VITAMIN D 25 Hydroxy (Vit-D Deficiency, Fractures)  . Vitamin B12  . TSH  . Ambulatory referral to Dermatology  . Ambulatory referral to  Neurology  . Amb ref to Medical Nutrition Therapy-MNT

## 2015-08-04 NOTE — Assessment & Plan Note (Signed)
Check thyroid and B12; last D was borderline

## 2015-08-04 NOTE — Patient Instructions (Addendum)
Check out the information at familydoctor.org entitled "What It Takes to Lose Weight" Try to lose between 1-2 pounds per week by taking in fewer calories and burning off more calories You can succeed by limiting portions, limiting foods dense in calories and fat, becoming more active, and drinking 8 glasses of water a day (64 ounces) Don't skip meals, especially breakfast, as skipping meals may alter your metabolism Do not use over-the-counter weight loss pills or gimmicks that claim rapid weight loss A healthy BMI (or body mass index) is between 18.5 and 24.9 You can calculate your ideal BMI at the [place] website [employer]Mon[employer]fr  Start the antibiotics Continue the nasal spray Call your Ear Nose Throat doctor and make an appointment to see him  Increase your vitamin D to 2,000 iu total per day We'll get labs today and refer you to the neuroloogist  We'll have you see the dermatologist about the nail

## 2015-08-04 NOTE — Assessment & Plan Note (Signed)
See AVS; refer to nutritionist and return in one month

## 2015-08-05 LAB — VITAMIN D 25 HYDROXY (VIT D DEFICIENCY, FRACTURES): VIT D 25 HYDROXY: 35 ng/mL (ref 30.0–100.0)

## 2015-08-05 LAB — VITAMIN B12: Vitamin B-12: 580 pg/mL (ref 211–946)

## 2015-08-05 LAB — T3, FREE: T3, Free: 3.1 pg/mL (ref 2.0–4.4)

## 2015-08-05 LAB — T4, FREE: Free T4: 0.99 ng/dL (ref 0.82–1.77)

## 2015-08-05 LAB — TSH: TSH: 3.5 u[IU]/mL (ref 0.450–4.500)

## 2015-08-09 NOTE — Assessment & Plan Note (Signed)
Check thyroid, vitamin levels; no reason to check for syphilis per patient; refer to neurologist

## 2015-08-09 NOTE — Assessment & Plan Note (Signed)
Concerning in that patient has no recollection of trauma; while the color looks like it could be subungual hematoma, have to consider malignant melanoma given that it appears to still be growing out from the base of the nail; refer to derm

## 2015-08-13 ENCOUNTER — Encounter: Payer: Self-pay | Admitting: Family Medicine

## 2015-08-13 ENCOUNTER — Ambulatory Visit: Payer: BLUE CROSS/BLUE SHIELD | Admitting: Family Medicine

## 2015-08-13 ENCOUNTER — Ambulatory Visit (INDEPENDENT_AMBULATORY_CARE_PROVIDER_SITE_OTHER): Payer: BLUE CROSS/BLUE SHIELD | Admitting: Family Medicine

## 2015-08-13 VITALS — BP 111/76 | HR 72 | Temp 97.8°F | Wt 257.0 lb

## 2015-08-13 DIAGNOSIS — L989 Disorder of the skin and subcutaneous tissue, unspecified: Secondary | ICD-10-CM

## 2015-08-13 DIAGNOSIS — R413 Other amnesia: Secondary | ICD-10-CM

## 2015-08-13 DIAGNOSIS — L609 Nail disorder, unspecified: Secondary | ICD-10-CM | POA: Diagnosis not present

## 2015-08-13 DIAGNOSIS — E559 Vitamin D deficiency, unspecified: Secondary | ICD-10-CM | POA: Diagnosis not present

## 2015-08-13 DIAGNOSIS — R7401 Elevation of levels of liver transaminase levels: Secondary | ICD-10-CM

## 2015-08-13 DIAGNOSIS — R740 Nonspecific elevation of levels of transaminase and lactic acid dehydrogenase [LDH]: Secondary | ICD-10-CM

## 2015-08-13 DIAGNOSIS — I10 Essential (primary) hypertension: Secondary | ICD-10-CM | POA: Diagnosis not present

## 2015-08-13 DIAGNOSIS — K760 Fatty (change of) liver, not elsewhere classified: Secondary | ICD-10-CM

## 2015-08-13 DIAGNOSIS — R7301 Impaired fasting glucose: Secondary | ICD-10-CM

## 2015-08-13 DIAGNOSIS — E785 Hyperlipidemia, unspecified: Secondary | ICD-10-CM

## 2015-08-13 NOTE — Assessment & Plan Note (Signed)
Check a1c today; working on weight loss and avoiding sweets

## 2015-08-13 NOTE — Assessment & Plan Note (Signed)
Check lipids today; work on weight loss

## 2015-08-13 NOTE — Assessment & Plan Note (Signed)
Last vit D was 30.5, now on two a day for the next 3-4 months

## 2015-08-13 NOTE — Progress Notes (Signed)
BP 111/76 mmHg  Pulse 72  Temp(Src) 97.8 F (36.6 C)  Wt 257 lb (116.574 kg)  SpO2 95%   Subjective:    Patient ID: Derrick Ball., male    DOB: [birth year], 57 y.o.   MRN: [id number]  HPI: Derrick Sandoval. is a 59 y.o. male  Chief Complaint  Patient presents with  . Hypertension    I think she's going to do bloodwork  . Hyperlipidemia  . Obesity  . IFG  . Spot on arm    spot on left arm where he removed a tick over the summer. It just started hurting and he scratched it and some clear liquid came out.   Hypertension; not checking BP away from doctor; no leg swelling; never had trouble with that  Prediabetes; wearing special diabetic socks; limiting sweets, loves his chocolate but he's behaving; no excessive thirst or dry mouth  Cholesterol; no medicine; typical breakfast might be two eggs, [place] sausage, pancake with sugar-free syrup, peaches in syrup (but pours most of the syrup off); seldom eats lunch; supper depends; [employer] once a week (fish, fried); avoids the potatoes; salads; not much cheese; 2% milk, diet Cokes  He has obesity; he is trying to watch his weight; got some fruit at the store and has lost 3 pounds since last visit  He has a place on the left arm where he took a tick off; picks and clear fluid came out; tick was there this summer  He has not seen the neurologist yet about the memory issues; he had meningitis in the 4th grade  He is going to see the urologist for his urination problems; Dr. Jacqlyn Larsen said something about putting him on valium  Relevant past medical, surgical, family and social history reviewed and updated as indicated. Interim medical history since our last visit reviewed. Family History  Problem Relation Age of Onset  . Cancer Mother     Breast  . Cancer Daughter     pancreatic  . Cancer Father     unsure, passed away before it was confirmed  . Diabetes Neg Hx   . Heart disease Neg Hx   . Hypertension Neg Hx   . Stroke Neg  Hx   . COPD Neg Hx   No diabetes in the family  Allergies and medications reviewed and updated.  Review of Systems  Constitutional: Negative for fever.  Gastrointestinal: Negative for abdominal pain.  Per HPI unless specifically indicated above     Objective:    BP 111/76 mmHg  Pulse 72  Temp(Src) 97.8 F (36.6 C)  Wt 257 lb (116.574 kg)  SpO2 95%  Wt Readings from Last 3 Encounters:  08/13/15 257 lb (116.574 kg)  08/04/15 260 lb (117.935 kg)  05/12/15 258 lb (117.028 kg)    Physical Exam  Constitutional: He appears well-developed and well-nourished. No distress.  HENT:  Head: Normocephalic and atraumatic.  Eyes: EOM are normal. No scleral icterus.  Neck: No thyromegaly present.  Cardiovascular: Normal rate and regular rhythm.   Pulmonary/Chest: Effort normal and breath sounds normal.  Abdominal: Soft. Bowel sounds are normal. He exhibits no distension.  Musculoskeletal: He exhibits no edema.  Neurological: Coordination normal.  Skin: Skin is warm and dry. No pallor.  Two lesions on the left forearm, one is unroofed, excoriated; other is erythematous papule about 1.5 mm diameter; no vesicle  Psychiatric: He has a normal mood and affect. His behavior is normal. Judgment and thought content normal.  Results for orders placed or performed in visit on 08/13/15  Hgb A1c w/o eAG  Result Value Ref Range   Hgb A1c MFr Bld 5.8 (H) 4.8 - 5.6 %  Lipid Panel w/o Chol/HDL Ratio  Result Value Ref Range   Cholesterol, Total 173 100 - 199 mg/dL   Triglycerides 97 0 - 149 mg/dL   HDL 41 >39 mg/dL   VLDL Cholesterol Cal 19 5 - 40 mg/dL   LDL Calculated 113 (H) 0 - 99 mg/dL      Assessment & Plan:   Problem List Items Addressed This Visit      Cardiovascular and Mediastinum   Benign hypertension    Well-controlled today; work on weight loss, DASH guidelines        Digestive   Fatty liver    Last sgpt was back to normal in Sept; just follow; work on weight         Endocrine   IFG (impaired fasting glucose)    Check a1c today; working on weight loss and avoiding sweets      Relevant Orders   Hgb A1c w/o eAG (Completed)     Musculoskeletal and Integument   Nail abnormality    I do see normal color now at the nailbase; patient says the dermatologist called but he decided not to go since getting better and growing out      Skin lesion of left arm    Suspect benign, excoriations; nothing to suggest skin cancer today; keep hands off and let me know if not healing        Other   Morbid obesity ([place])    Really encouraged patient to work on weight loss; see AVS; reduce fatty foods, total calories      Elevated transaminase level    Returned to normal last visit; will not check today, check again at next f/u      Hyperlipidemia - Primary    Check lipids today; work on weight loss      Relevant Orders   Lipid Panel w/o Chol/HDL Ratio (Completed)   Vitamin D deficiency    Last vit D was 30.5, now on two a day for the next 3-4 months      Short-term memory loss    Patient to see neurologist soon         Follow up plan: Return in about 3 months (around 11/11/2015) for 30 minute visit.  An after-visit summary was printed and given to the patient at [place].  Please see the patient instructions which may contain other information and recommendations beyond what is mentioned above in the assessment and plan.

## 2015-08-13 NOTE — Assessment & Plan Note (Signed)
I do see normal color now at the nailbase; patient says the dermatologist called but he decided not to go since getting better and growing out

## 2015-08-13 NOTE — Patient Instructions (Signed)
Try to limit saturated fats in your diet (bologna, hot dogs, barbeque, cheeseburgers, hamburgers, steak, bacon, sausage, cheese, etc.) and get more fresh fruits, vegetables, and whole grains Do see the neurologist about your memory We'll get the labs today Keep up the good work with your efforts at losing weight Check out the information at familydoctor.org entitled "What It Takes to Lose Weight" Try to lose between 1-2 pounds per week by taking in fewer calories and burning off more calories You can succeed by limiting portions, limiting foods dense in calories and fat, becoming more active, and drinking 8 glasses of water a day (64 ounces) Don't skip meals, especially breakfast, as skipping meals may alter your metabolism Do not use over-the-counter weight loss pills or gimmicks that claim rapid weight loss A healthy BMI (or body mass index) is between 18.5 and 24.9 You can calculate your ideal BMI at the [place] website [employer]fr Keep an eye on the places on your arm and fingernail and see dermatologist if not resolving soon See the urologist about any bladder or prostate issues Return in 3 months

## 2015-08-13 NOTE — Assessment & Plan Note (Signed)
Last sgpt was back to normal in Sept; just follow; work on [employer]

## 2015-08-13 NOTE — Assessment & Plan Note (Signed)
Returned to normal last visit; will not check today, check again at next f/u

## 2015-08-14 LAB — LIPID PANEL W/O CHOL/HDL RATIO
Cholesterol, Total: 173 mg/dL (ref 100–199)
HDL: 41 mg/dL (ref >39)
LDL Calculated: 113 mg/dL — ABNORMAL HIGH (ref 0–99)
TRIGLYCERIDES: 97 mg/dL (ref 0–149)
VLDL Cholesterol Cal: 19 mg/dL (ref 5–40)

## 2015-08-14 LAB — HGB A1C W/O EAG: HEMOGLOBIN A1C: 5.8 % — AB (ref 4.8–5.6)

## 2015-08-16 ENCOUNTER — Encounter: Payer: Self-pay | Admitting: Family Medicine

## 2015-08-16 DIAGNOSIS — L989 Disorder of the skin and subcutaneous tissue, unspecified: Secondary | ICD-10-CM | POA: Insufficient documentation

## 2015-08-16 NOTE — Assessment & Plan Note (Signed)
Patient to see neurologist soon

## 2015-08-16 NOTE — Assessment & Plan Note (Signed)
Really encouraged patient to work on weight loss; see AVS; reduce fatty foods, total calories

## 2015-08-16 NOTE — Assessment & Plan Note (Signed)
Suspect benign, excoriations; nothing to suggest skin cancer today; keep hands off and let me know if not healing

## 2015-08-16 NOTE — Assessment & Plan Note (Signed)
Well-controlled today; work on weight loss, DASH guidelines

## 2015-09-29 ENCOUNTER — Other Ambulatory Visit: Payer: Self-pay

## 2015-09-29 MED ORDER — FLUTICASONE PROPIONATE 50 MCG/ACT NA SUSP: 2.0000 | Freq: Two times a day (BID) | NASAL | Status: DC

## 2015-09-29 NOTE — Telephone Encounter (Signed)
[facility] requesting 90 day Rx for flonase

## 2015-11-11 ENCOUNTER — Ambulatory Visit: Payer: BLUE CROSS/BLUE SHIELD | Admitting: Family Medicine

## 2016-01-23 ENCOUNTER — Encounter: Payer: Self-pay | Admitting: Family Medicine

## 2016-01-23 ENCOUNTER — Ambulatory Visit (INDEPENDENT_AMBULATORY_CARE_PROVIDER_SITE_OTHER): Payer: Commercial Managed Care - HMO | Admitting: Family Medicine

## 2016-01-23 VITALS — BP 134/88 | HR 80 | Temp 98.1°F | Wt 245.0 lb

## 2016-01-23 DIAGNOSIS — A6000 Herpesviral infection of urogenital system, unspecified: Secondary | ICD-10-CM | POA: Insufficient documentation

## 2016-01-23 DIAGNOSIS — E559 Vitamin D deficiency, unspecified: Secondary | ICD-10-CM | POA: Diagnosis not present

## 2016-01-23 DIAGNOSIS — I10 Essential (primary) hypertension: Secondary | ICD-10-CM | POA: Diagnosis not present

## 2016-01-23 DIAGNOSIS — R7301 Impaired fasting glucose: Secondary | ICD-10-CM | POA: Diagnosis not present

## 2016-01-23 DIAGNOSIS — K760 Fatty (change of) liver, not elsewhere classified: Secondary | ICD-10-CM

## 2016-01-23 DIAGNOSIS — K219 Gastro-esophageal reflux disease without esophagitis: Secondary | ICD-10-CM | POA: Diagnosis not present

## 2016-01-23 DIAGNOSIS — E785 Hyperlipidemia, unspecified: Secondary | ICD-10-CM | POA: Diagnosis not present

## 2016-01-23 DIAGNOSIS — Z125 Encounter for screening for malignant neoplasm of prostate: Secondary | ICD-10-CM | POA: Diagnosis not present

## 2016-01-23 DIAGNOSIS — N401 Enlarged prostate with lower urinary tract symptoms: Secondary | ICD-10-CM | POA: Diagnosis not present

## 2016-01-23 DIAGNOSIS — S91332A Puncture wound without foreign body, left foot, initial encounter: Secondary | ICD-10-CM

## 2016-01-23 DIAGNOSIS — R0981 Nasal congestion: Secondary | ICD-10-CM | POA: Diagnosis not present

## 2016-01-23 DIAGNOSIS — Z23 Encounter for immunization: Secondary | ICD-10-CM | POA: Diagnosis not present

## 2016-01-23 LAB — LIPID PANEL PICCOLO, WAIVED
CHOL/HDL RATIO PICCOLO,WAIVE: 3.5 mg/dL
CHOLESTEROL PICCOLO, WAIVED: 174 mg/dL (ref <200)
HDL CHOL PICCOLO, WAIVED: 50 mg/dL — AB (ref >59)
LDL CHOL CALC PICCOLO WAIVED: 106 mg/dL — AB (ref <100)
TRIGLYCERIDES PICCOLO,WAIVED: 91 mg/dL (ref <150)
VLDL Chol Calc Piccolo,Waive: 18 mg/dL (ref <30)

## 2016-01-23 LAB — BAYER DCA HB A1C WAIVED: HB A1C (BAYER DCA - WAIVED): 5.6 % (ref <7.0)

## 2016-01-23 MED ORDER — VALACYCLOVIR HCL 500 MG PO TABS
ORAL_TABLET | ORAL | Status: DC
Start: 2016-01-23 — End: 2016-04-29

## 2016-01-23 MED ORDER — CLOTRIMAZOLE-BETAMETHASONE 1-0.05 % EX CREA: 1.0000 "application " | TOPICAL_CREAM | Freq: Two times a day (BID) | CUTANEOUS | Status: DC

## 2016-01-23 MED ORDER — PREDNISONE 50 MG PO TABS: ORAL_TABLET | ORAL | Status: DC

## 2016-01-23 MED ORDER — FLUTICASONE PROPIONATE 50 MCG/ACT NA SUSP: 2.0000 | Freq: Two times a day (BID) | NASAL | Status: DC

## 2016-01-23 MED ORDER — RANITIDINE HCL 300 MG PO TABS: 300.0000 mg | ORAL_TABLET | Freq: Every day | ORAL | Status: DC

## 2016-01-23 NOTE — Assessment & Plan Note (Signed)
Rechecking levels today. Await results.  

## 2016-01-23 NOTE — Assessment & Plan Note (Signed)
Under good control with weight loss. Continue diet and exercise. Continue to monitor.

## 2016-01-23 NOTE — Assessment & Plan Note (Signed)
Under good control with valacyclovir. Continue current regimen. Continue to monitor.

## 2016-01-23 NOTE — Progress Notes (Signed)
BP 134/88 mmHg  Pulse 80  Temp(Src) 98.1 F (36.7 C)  Wt 245 lb (111.131 kg)  SpO2 96%   Subjective:    Patient ID: Derrick Sandoval., male    DOB: [birth year], 58 y.o.   MRN: [id number]  HPI: Kamran Robson. is a 60 y.o. male who is changing providers within the practice. He has been without insurance for about 6 months and here today   Chief Complaint  Patient presents with  . Hypertension    Patient has been out of his BP meds for a while, so he is not sure if he still needs to take them  . Rash    When patient sweats, he will get a rash under his arm, he has been giving a cream, he would like a refill  . Allergies    Refill of flonase  . Benign Prostatic Hypertrophy    Refill of tamsulosin   . Gastroesophageal Reflux    Refill on Zantac  . genital herpes    Refill on Valtrex   Ears are feeling stopped up Stepped on a nail last night- hurts. No redness, no swelling  HYPERTENSION / HYPERLIPIDEMIA Satisfied with current treatment? yes Duration of hypertension: chronic- has not taken his medicine in 3-6 months. Has changed his eating habits BP monitoring frequency: not checking BP medication side effects: no Past BP meds: amlodipine, lisinopril Duration of hyperlipidemia: chronic Cholesterol medication side effects: yes- joint paine Cholesterol supplements: none Past cholesterol medications: statin Medication compliance: poor compliance Aspirin: yes Recent stressors: no Recurrent headaches: no Visual changes: no Palpitations: no Dyspnea: no Chest pain: no Lower extremity edema: no Dizzy/lightheaded: no  BPH- has been taking his flomax. Not sure how well it works. Has had problems with his urine since he was in 4th grade, has seen urologist in the past and has worked with him  Impaired Fasting Glucose HbA1C:  Lab Results  Component Value Date   HGBA1C 5.8* 08/13/2015   Duration of elevated blood sugar: unknown Polydipsia: no Polyuria: no Weight  change: yes- with effort, 15lbs Visual disturbance: no Glucose Monitoring: no Diabetic Education: Not Completed Family history of diabetes: yes  RASH Duration:  chronic  Location: armpits when it gets really hot  Itching: yes Burning: no Redness: yes Oozing: no Scaling: no Blisters: no Painful: no Fevers: no Change in detergents/soaps/personal care products: no Recent illness: no Recent travel:no History of same: yes Context: better Alleviating factors: lortisone Treatments attempted: lortisone Shortness of breath: no  Throat/tongue swelling: no Myalgias/arthralgias: no   Active Ambulatory Problems    Diagnosis Date Noted  . IFG (impaired fasting glucose) 01/28/2015  . Fatty liver 01/28/2015  . Left ventricular hypertrophy 01/28/2015  . Morbid obesity ([place]) 01/28/2015  . Rhinitis, allergic 01/28/2015  . Elevated transaminase level 01/28/2015  . Hyperlipidemia 01/28/2015  . Benign hypertension 01/28/2015  . Benign prostatic hypertrophy with lower urinary tract symptoms (LUTS) 01/28/2015  . GERD (gastroesophageal reflux disease) 01/29/2015  . OA (osteoarthritis) of knee 01/29/2015  . Dysphagia 05/12/2015  . Fatigue 05/12/2015  . Vitamin D deficiency 05/12/2015  . Blood in the stool 05/12/2015  . Nail abnormality 08/04/2015  . Short-term memory loss 08/04/2015  . Skin lesion of left arm 08/16/2015  . Genital herpes 01/23/2016   Resolved Ambulatory Problems    Diagnosis Date Noted  . No Resolved Ambulatory Problems   Past Medical History  Diagnosis Date  . Allergy   . Hypertension    No Known Allergies  Past Surgical History  Procedure Laterality Date  . Total knee arthroplasty    . Nasal sinus surgery    . Joint replacement Bilateral    Social History   Social History  . Marital Status: Married    Spouse Name: N/A  . Number of Children: N/A  . Years of Education: N/A   Occupational History  . Not on file.   Social History Main Topics  .  Smoking status: Never Smoker   . Smokeless tobacco: Never Used  . Alcohol Use: No     Comment: rare  . Drug Use: No  . Sexual Activity: Not on file   Other Topics Concern  . Not on file   Social History Narrative   Family History  Problem Relation Age of Onset  . Cancer Mother     Breast  . Cancer Daughter     pancreatic  . Cancer Father     unsure, passed away before it was confirmed  . Diabetes Neg Hx   . Heart disease Neg Hx   . Hypertension Neg Hx   . Stroke Neg Hx   . COPD Neg Hx    Review of Systems  Constitutional: Negative.   Respiratory: Negative.   Cardiovascular: Negative.   Genitourinary: Positive for difficulty urinating.  Psychiatric/Behavioral: Negative.     Per HPI unless specifically indicated above     Objective:    BP 134/88 mmHg  Pulse 80  Temp(Src) 98.1 F (36.7 C)  Wt 245 lb (111.131 kg)  SpO2 96%  Wt Readings from Last 3 Encounters:  01/23/16 245 lb (111.131 kg)  08/13/15 257 lb (116.574 kg)  08/04/15 260 lb (117.935 kg)    Physical Exam  Constitutional: He is oriented to person, place, and time. He appears well-developed and well-nourished. No distress.  HENT:  Head: Normocephalic and atraumatic.  Right Ear: Hearing, tympanic membrane, external ear and ear canal normal.  Left Ear: Hearing, tympanic membrane, external ear and ear canal normal.  Nose: Mucosal edema present. No rhinorrhea. Right sinus exhibits no frontal sinus tenderness. Left sinus exhibits no frontal sinus tenderness.  Mouth/Throat: Uvula is midline, oropharynx is clear and moist and mucous membranes are normal. No oropharyngeal exudate.  Eyes: Conjunctivae, EOM and lids are normal. Pupils are equal, round, and reactive to light. Right eye exhibits no discharge. Left eye exhibits no discharge. No scleral icterus.  Neck: Normal range of motion. Neck supple. No JVD present. No tracheal deviation present. No thyromegaly present.  Cardiovascular: Normal rate, regular  rhythm, normal heart sounds and intact distal pulses.  Exam reveals no gallop and no friction rub.   No murmur heard. Pulmonary/Chest: Effort normal and breath sounds normal. No stridor. No respiratory distress. He has no wheezes. He has no rales. He exhibits no tenderness.  Musculoskeletal: Normal range of motion.  Lymphadenopathy:    He has no cervical adenopathy.  Neurological: He is alert and oriented to person, place, and time.  Skin: Skin is warm, dry and intact. No rash noted. He is not diaphoretic. No erythema. No pallor.  Puncture wound L heel, no red, no warmth, no swelling  Psychiatric: He has a normal mood and affect. His speech is normal and behavior is normal. Judgment and thought content normal. Cognition and memory are normal.  Nursing note and vitals reviewed.   Results for orders placed or performed in visit on 01/23/16  Bayer DCA Hb A1c Waived  Result Value Ref Range   Bayer DCA Hb A1c  Waived 5.6 <7.0 %  Lipid Panel Piccolo, Waived  Result Value Ref Range   Cholesterol Piccolo, Waived 174 <200 mg/dL   HDL Chol Piccolo, Waived 50 (L) >59 mg/dL   Triglycerides Piccolo,Waived 91 <150 mg/dL   Chol/HDL Ratio Piccolo,Waive 3.5 mg/dL   LDL Chol Calc Piccolo Waived 106 (H) <100 mg/dL   VLDL Chol Calc Piccolo,Waive 18 <30 mg/dL      Assessment & Plan:   Problem List Items Addressed This Visit      Cardiovascular and Mediastinum   Benign hypertension - Primary    Under good control with weight loss. Continue diet and exercise. Continue to monitor. Recheck 1 month.       Relevant Orders   TSH   UA/M w/rflx Culture, Routine   Microalbumin, Urine Waived     Digestive   Fatty liver    Rechecking levels today. Await results.       Relevant Orders   Comprehensive metabolic panel   UA/M w/rflx Culture, Routine   GERD (gastroesophageal reflux disease)   Relevant Medications   ranitidine (ZANTAC) 300 MG tablet   Other Relevant Orders   CBC with  Differential/Platelet   UA/M w/rflx Culture, Routine     Endocrine   IFG (impaired fasting glucose)    Under good control with weight loss. Continue diet and exercise. Continue to monitor.       Relevant Orders   Bayer DCA Hb A1c Waived (Completed)   UA/M w/rflx Culture, Routine     Genitourinary   Benign prostatic hypertrophy with lower urinary tract symptoms (LUTS)    Not under great control. Continue to follow with urologist. Call with any concerns.       Relevant Orders   UA/M w/rflx Culture, Routine   Genital herpes    Under good control with valacyclovir. Continue current regimen. Continue to monitor.       Relevant Medications   valACYclovir (VALTREX) 500 MG tablet   clotrimazole-betamethasone (LOTRISONE) cream     Other   Hyperlipidemia    Under good control with weight loss. Continue diet and exercise. Continue to monitor.       Relevant Orders   Lipid Panel Piccolo, Waived (Completed)   UA/M w/rflx Culture, Routine   Vitamin D deficiency    Rechecking levels today. Await results.       Relevant Orders   UA/M w/rflx Culture, Routine   VITAMIN D 25 Hydroxy (Vit-D Deficiency, Fractures)    Other Visit Diagnoses    Screening for prostate cancer        Labs checked today. Await results.     Relevant Orders    PSA    Puncture wound of foot, left, initial encounter        No sign of infection. Td given today. Call with any sign of infection.     Nasal congestion        No sign of infection. Will treat will 4 days of prednisone. Let us know if not getting better or getting worse.        Follow up plan: Return in about 4 weeks (around 02/20/2016) for BP check.

## 2016-01-23 NOTE — Assessment & Plan Note (Signed)
Under good control with weight loss. Continue diet and exercise. Continue to monitor. Recheck 1 month.

## 2016-01-23 NOTE — Assessment & Plan Note (Signed)
Not under great control. Continue to follow with urologist. Call with any concerns.

## 2016-01-24 LAB — COMPREHENSIVE METABOLIC PANEL
ALBUMIN: 4.4 g/dL (ref 3.5–5.5)
ALK PHOS: 77 IU/L (ref 39–117)
ALT: 34 IU/L (ref 0–44)
AST: 29 IU/L (ref 0–40)
Albumin/Globulin Ratio: 1.8 (ref 1.2–2.2)
BUN / CREAT RATIO: 22 — AB (ref 9–20)
BUN: 17 mg/dL (ref 6–24)
Bilirubin Total: 0.5 mg/dL (ref 0.0–1.2)
CALCIUM: 9.6 mg/dL (ref 8.7–10.2)
CO2: 19 mmol/L (ref 18–29)
CREATININE: 0.76 mg/dL (ref 0.76–1.27)
Chloride: 104 mmol/L (ref 96–106)
GFR calc Af Amer: 116 mL/min/{1.73_m2} (ref >59)
GFR calc non Af Amer: 101 mL/min/{1.73_m2} (ref >59)
GLOBULIN, TOTAL: 2.5 g/dL (ref 1.5–4.5)
Glucose: 97 mg/dL (ref 65–99)
Potassium: 4.3 mmol/L (ref 3.5–5.2)
Sodium: 143 mmol/L (ref 134–144)
Total Protein: 6.9 g/dL (ref 6.0–8.5)

## 2016-01-24 LAB — CBC WITH DIFFERENTIAL/PLATELET
BASOS ABS: 0 10*3/uL (ref 0.0–0.2)
Basos: 1 %
EOS (ABSOLUTE): 0.2 10*3/uL (ref 0.0–0.4)
EOS: 3 %
HEMATOCRIT: 46.3 % (ref 37.5–51.0)
HEMOGLOBIN: 16.1 g/dL (ref 12.6–17.7)
IMMATURE GRANS (ABS): 0 10*3/uL (ref 0.0–0.1)
IMMATURE GRANULOCYTES: 0 %
LYMPHS: 39 %
Lymphocytes Absolute: 2.3 10*3/uL (ref 0.7–3.1)
MCH: 30.7 pg (ref 26.6–33.0)
MCHC: 34.8 g/dL (ref 31.5–35.7)
MCV: 88 fL (ref 79–97)
MONOCYTES: 8 %
Monocytes Absolute: 0.5 10*3/uL (ref 0.1–0.9)
NEUTROS PCT: 49 %
Neutrophils Absolute: 3 10*3/uL (ref 1.4–7.0)
Platelets: 204 10*3/uL (ref 150–379)
RBC: 5.24 x10E6/uL (ref 4.14–5.80)
RDW: 15.1 % (ref 12.3–15.4)
WBC: 6 10*3/uL (ref 3.4–10.8)

## 2016-01-24 LAB — TSH: TSH: 2.73 u[IU]/mL (ref 0.450–4.500)

## 2016-01-24 LAB — PSA: PROSTATE SPECIFIC AG, SERUM: 0.2 ng/mL (ref 0.0–4.0)

## 2016-01-24 LAB — VITAMIN D 25 HYDROXY (VIT D DEFICIENCY, FRACTURES): Vit D, 25-Hydroxy: 41.3 ng/mL (ref 30.0–100.0)

## 2016-01-26 ENCOUNTER — Other Ambulatory Visit: Payer: Self-pay | Admitting: Family Medicine

## 2016-01-26 ENCOUNTER — Encounter: Payer: Self-pay | Admitting: Family Medicine

## 2016-01-26 ENCOUNTER — Other Ambulatory Visit: Payer: Commercial Managed Care - HMO

## 2016-01-26 DIAGNOSIS — I10 Essential (primary) hypertension: Secondary | ICD-10-CM

## 2016-01-26 DIAGNOSIS — Z0000 Encounter for general adult medical examination without abnormal findings: Secondary | ICD-10-CM

## 2016-01-27 ENCOUNTER — Telehealth: Payer: Self-pay

## 2016-01-27 MED ORDER — NYSTATIN 100000 UNIT/GM EX CREA: TOPICAL_CREAM | Freq: Two times a day (BID) | CUTANEOUS | Status: DC

## 2016-01-27 NOTE — Telephone Encounter (Signed)
Patient would like a rx refill on Nystatin cream sent to [facility].

## 2016-01-27 NOTE — Telephone Encounter (Signed)
Rx sent to his pharmacy

## 2016-01-29 LAB — UA/M W/RFLX CULTURE, ROUTINE
BILIRUBIN UA: NEGATIVE
GLUCOSE, UA: NEGATIVE
KETONES UA: NEGATIVE
LEUKOCYTES UA: NEGATIVE
Nitrite, UA: NEGATIVE
PH UA: 5 (ref 5.0–7.5)
Protein, UA: NEGATIVE
RBC UA: NEGATIVE
Specific Gravity, UA: 1.015 (ref 1.005–1.030)
Urobilinogen, Ur: 0.2 mg/dL (ref 0.2–1.0)

## 2016-01-29 LAB — MICROALBUMIN, URINE WAIVED
Creatinine, Urine Waived: 100 mg/dL (ref 10–300)
MICROALB, UR WAIVED: 10 mg/L (ref 0–19)
Microalb/Creat Ratio: <30 mg/g (ref <30)

## 2016-02-11 ENCOUNTER — Telehealth: Payer: Self-pay

## 2016-02-11 DIAGNOSIS — Z471 Aftercare following joint replacement surgery: Secondary | ICD-10-CM | POA: Diagnosis not present

## 2016-02-11 DIAGNOSIS — Z96652 Presence of left artificial knee joint: Secondary | ICD-10-CM | POA: Diagnosis not present

## 2016-02-11 NOTE — Telephone Encounter (Signed)
[facility] fax states if you change current Rx for Valacyclovir Tab   To  Acyclovir Tab  It will save the patient appox. $192.00 yearly

## 2016-02-12 MED ORDER — ACYCLOVIR 400 MG PO TABS: 400.0000 mg | ORAL_TABLET | Freq: Two times a day (BID) | ORAL | Status: DC

## 2016-02-12 NOTE — Telephone Encounter (Signed)
Rx sent to his pharmacy

## 2016-02-23 ENCOUNTER — Ambulatory Visit (INDEPENDENT_AMBULATORY_CARE_PROVIDER_SITE_OTHER): Payer: Commercial Managed Care - HMO | Admitting: Family Medicine

## 2016-02-23 ENCOUNTER — Telehealth: Payer: Self-pay | Admitting: Family Medicine

## 2016-02-23 ENCOUNTER — Encounter: Payer: Self-pay | Admitting: Family Medicine

## 2016-02-23 VITALS — BP 122/72 | HR 69 | Temp 97.9°F | Wt 250.0 lb

## 2016-02-23 DIAGNOSIS — H6521 Chronic serous otitis media, right ear: Secondary | ICD-10-CM | POA: Diagnosis not present

## 2016-02-23 DIAGNOSIS — R0981 Nasal congestion: Secondary | ICD-10-CM | POA: Diagnosis not present

## 2016-02-23 DIAGNOSIS — I10 Essential (primary) hypertension: Secondary | ICD-10-CM

## 2016-02-23 MED ORDER — PREDNISONE 50 MG PO TABS: 50.0000 mg | ORAL_TABLET | Freq: Every day | ORAL | Status: DC

## 2016-02-23 NOTE — Telephone Encounter (Signed)
Rx sent to his pharmacy

## 2016-02-23 NOTE — Assessment & Plan Note (Signed)
Still under fair control. Continue diet and exercise. Call with any concerns. Recheck 6 months.

## 2016-02-23 NOTE — Progress Notes (Signed)
BP 122/72 mmHg  Pulse 69  Temp(Src) 97.9 F (36.6 C)  Wt 250 lb (113.399 kg)  SpO2 97%   Subjective:    Patient ID: Derrick Ball., male    DOB: 11/08/1955, 60 y.o.   MRN: [id number]  HPI: Derrick Mcniff. is a 60 y.o. male  Chief Complaint  Patient presents with  . Hypertension  . fluid in ears   HYPERTENSION Hypertension status: controlled  Satisfied with current treatment? yes Duration of hypertension: chronic BP monitoring frequency:  not checking BP medication side effects:  no Medication compliance: not on anything Previous BP meds: amlodipine, lisinopril Aspirin: yes Recurrent headaches: no Visual changes: no Palpitations: no Dyspnea: no Chest pain: no Lower extremity edema: no Dizzy/lightheaded: no  EAG CLOGGED Duration: months Involved ear(s):  "right Sensation of feeling clogged/plugged: yes Decreased/muffled hearing:yes Ear pain: yes Fever: no Otorrhea: no Hearing loss: no Upper respiratory infection symptoms: yes Using Q-Tips: yes Status: worse History of cerumenosis: yes Treatments attempted: none, has had sinus surgery without benefit, flonase is not helping, prednisone helped last time  Relevant past medical, surgical, family and social history reviewed and updated as indicated. Interim medical history since our last visit reviewed. Allergies and medications reviewed and updated.  Review of Systems  Constitutional: Negative.   HENT: Positive for congestion, ear pain, hearing loss and sinus pressure. Negative for dental problem, drooling, ear discharge, facial swelling, mouth sores, nosebleeds, postnasal drip, rhinorrhea, sneezing, sore throat, tinnitus, trouble swallowing and voice change.   Respiratory: Negative.   Cardiovascular: Negative.   Psychiatric/Behavioral: Negative.     Per HPI unless specifically indicated above     Objective:    BP 122/72 mmHg  Pulse 69  Temp(Src) 97.9 F (36.6 C)  Wt 250 lb (113.399 kg)  SpO2  97%  Wt Readings from Last 3 Encounters:  02/23/16 250 lb (113.399 kg)  01/23/16 245 lb (111.131 kg)  08/13/15 257 lb (116.574 kg)    Physical Exam  Constitutional: He is oriented to person, place, and time. He appears well-developed and well-nourished. No distress.  HENT:  Head: Normocephalic and atraumatic.  Right Ear: Hearing, external ear and ear canal normal. No lacerations. No drainage, swelling or tenderness. No foreign bodies. No mastoid tenderness. Tympanic membrane is not injected, not scarred, not perforated, not erythematous, not retracted and not bulging. Tympanic membrane mobility is normal. No hemotympanum. No decreased hearing is noted.  Left Ear: Hearing, tympanic membrane, external ear and ear canal normal.  Nose: Nose normal.  Mouth/Throat: Oropharynx is clear and moist. No oropharyngeal exudate.  Eyes: Conjunctivae, EOM and lids are normal. Pupils are equal, round, and reactive to light. Right eye exhibits no discharge. Left eye exhibits no discharge. No scleral icterus.  Neck: Normal range of motion. Neck supple. No JVD present. No tracheal deviation present. No thyromegaly present.  Cardiovascular: Normal rate, regular rhythm, normal heart sounds and intact distal pulses.  Exam reveals no gallop and no friction rub.   No murmur heard. Pulmonary/Chest: Effort normal and breath sounds normal. No stridor. No respiratory distress. He has no wheezes. He has no rales. He exhibits no tenderness.  Musculoskeletal: Normal range of motion.  Lymphadenopathy:    He has no cervical adenopathy.  Neurological: He is alert and oriented to person, place, and time.  Skin: Skin is warm, dry and intact. No rash noted. He is not diaphoretic. No erythema. No pallor.  Psychiatric: He has a normal mood and affect. His speech is normal  and behavior is normal. Judgment and thought content normal. Cognition and memory are normal.  Nursing note and vitals reviewed.   Results for orders placed  or performed in visit on 01/26/16  Microalbumin, Urine Waived  Result Value Ref Range   Microalb, Ur Waived 10 0 - 19 mg/L   Creatinine, Urine Waived 100 10 - 300 mg/dL   Microalb/Creat Ratio <30 <30 mg/g  UA/M w/rflx Culture, Routine  Result Value Ref Range   Specific Gravity, UA 1.015 1.005 - 1.030   pH, UA 5.0 5.0 - 7.5   Color, UA Yellow Yellow   Appearance Ur Clear Clear   Leukocytes, UA Negative Negative   Protein, UA Negative Negative/Trace   Glucose, UA Negative Negative   Ketones, UA Negative Negative   RBC, UA Negative Negative   Bilirubin, UA Negative Negative   Urobilinogen, Ur 0.2 0.2 - 1.0 mg/dL   Nitrite, UA Negative Negative      Assessment & Plan:   Problem List Items Addressed This Visit      Cardiovascular and Mediastinum   Benign hypertension - Primary    Still under fair control. Continue diet and exercise. Call with any concerns. Recheck 6 months.        Other Visit Diagnoses    Nasal congestion        No better. Would like to go back to ENT. Referral generated today. Continue flonase.    Relevant Orders    Ambulatory referral to ENT    Right chronic serous otitis media        No better. Would like to go back to ENT. Referral generated today. Continue flonase.    Relevant Orders    Ambulatory referral to ENT        Follow up plan: Return in about 6 months (around 08/25/2016) for BP check.

## 2016-02-23 NOTE — Telephone Encounter (Signed)
Pt would like something for sinus issues.  He will see his ENT Aug 8th.

## 2016-02-24 ENCOUNTER — Telehealth: Payer: Self-pay

## 2016-02-24 MED ORDER — PREDNISONE 10 MG PO TABS: ORAL_TABLET | ORAL | Status: DC

## 2016-02-24 NOTE — Telephone Encounter (Signed)
Pt would like to have rx sent to [facility] instead.

## 2016-02-24 NOTE — Telephone Encounter (Signed)
Rx sent to local pharmacy.  

## 2016-02-24 NOTE — Telephone Encounter (Signed)
Please send a different dose to local pharmacy

## 2016-02-24 NOTE — Telephone Encounter (Signed)
Patient notified

## 2016-03-18 ENCOUNTER — Other Ambulatory Visit: Payer: Self-pay | Admitting: Family Medicine

## 2016-03-18 MED ORDER — TAMSULOSIN HCL 0.4 MG PO CAPS: 0.4000 mg | ORAL_CAPSULE | Freq: Two times a day (BID) | ORAL | 1 refills | Status: DC

## 2016-03-31 DIAGNOSIS — J329 Chronic sinusitis, unspecified: Secondary | ICD-10-CM | POA: Diagnosis not present

## 2016-03-31 DIAGNOSIS — J310 Chronic rhinitis: Secondary | ICD-10-CM | POA: Diagnosis not present

## 2016-03-31 DIAGNOSIS — K219 Gastro-esophageal reflux disease without esophagitis: Secondary | ICD-10-CM | POA: Diagnosis not present

## 2016-03-31 DIAGNOSIS — J339 Nasal polyp, unspecified: Secondary | ICD-10-CM | POA: Diagnosis not present

## 2016-03-31 DIAGNOSIS — Z6839 Body mass index (BMI) 39.0-39.9, adult: Secondary | ICD-10-CM | POA: Diagnosis not present

## 2016-03-31 DIAGNOSIS — H6521 Chronic serous otitis media, right ear: Secondary | ICD-10-CM | POA: Diagnosis not present

## 2016-03-31 DIAGNOSIS — I10 Essential (primary) hypertension: Secondary | ICD-10-CM | POA: Diagnosis not present

## 2016-03-31 DIAGNOSIS — R0981 Nasal congestion: Secondary | ICD-10-CM | POA: Diagnosis not present

## 2016-04-29 ENCOUNTER — Ambulatory Visit (INDEPENDENT_AMBULATORY_CARE_PROVIDER_SITE_OTHER): Payer: Commercial Managed Care - HMO | Admitting: Family Medicine

## 2016-04-29 ENCOUNTER — Encounter: Payer: Self-pay | Admitting: Family Medicine

## 2016-04-29 VITALS — BP 134/91 | HR 64 | Temp 98.3°F | Ht 68.0 in | Wt 257.6 lb

## 2016-04-29 DIAGNOSIS — J0100 Acute maxillary sinusitis, unspecified: Secondary | ICD-10-CM

## 2016-04-29 DIAGNOSIS — Z23 Encounter for immunization: Secondary | ICD-10-CM | POA: Diagnosis not present

## 2016-04-29 MED ORDER — AZITHROMYCIN 250 MG PO TABS: ORAL_TABLET | ORAL | 0 refills | Status: DC

## 2016-04-29 MED ORDER — CETIRIZINE HCL 10 MG PO TABS: 10.0000 mg | ORAL_TABLET | Freq: Every day | ORAL | 11 refills | Status: DC

## 2016-04-29 NOTE — Patient Instructions (Signed)

## 2016-04-29 NOTE — Progress Notes (Signed)
BP (!) 134/91 (BP Location: Right Arm, Cuff Size: Large)   Pulse 64   Temp 98.3 F (36.8 C)   Ht 5\' 8"  (1.727 m)   Wt 257 lb 9.6 oz (116.8 kg)   SpO2 95%   BMI 39.17 kg/m    Subjective:    Patient ID: Derrick Ball., male    DOB: 1956-01-02, 60 y.o.   MRN: Derrick Sandoval  HPI: Derrick Grimaud. is a 60 y.o. male  Chief Complaint  Patient presents with  . URI    pt states he has been having a cough, sinus pressure, congestion, and headache off and on for about a month now   Chest congestion, productive cough, chest tightness, sinus pressure and HAs, ear pain off and on for about a month now. Saw ENT 3 years ago as this is a recurrent issue for him, and was placed on flonase long term which he is still on. Tried several days of sudafed with no relief. Using albuterol inhaler as needed. Denies notice of fever, chills, sweats.   Relevant past medical, surgical, family and social history reviewed and updated as indicated. Interim medical history since our last visit reviewed. Allergies and medications reviewed and updated.  Review of Systems  Constitutional: Negative.   HENT: Positive for congestion, ear pain and sinus pressure.   Eyes: Negative.   Respiratory: Positive for cough.   Cardiovascular: Negative.   Gastrointestinal: Negative.   Genitourinary: Negative.   Musculoskeletal: Negative.   Neurological: Negative.   Psychiatric/Behavioral: Negative.     Per HPI unless specifically indicated above     Objective:    BP (!) 134/91 (BP Location: Right Arm, Cuff Size: Large)   Pulse 64   Temp 98.3 F (36.8 C)   Ht 5\' 8"  (1.727 m)   Wt 257 lb 9.6 oz (116.8 kg)   SpO2 95%   BMI 39.17 kg/m   Wt Readings from Last 3 Encounters:  04/29/16 257 lb 9.6 oz (116.8 kg)  02/23/16 250 lb (113.4 kg)  01/23/16 245 lb (111.1 kg)    Physical Exam  Constitutional: He is oriented to person, place, and time. He appears well-developed and well-nourished. No distress.  HENT:    Head: Atraumatic.  Nose: Nose normal.  Mouth/Throat: Oropharynx is clear and moist. No oropharyngeal exudate.  Right TM with mild-moderate effusion. No erythema or purulence TTP over b/l maxillary sinuses  Eyes: Conjunctivae are normal. No scleral icterus.  Neck: Normal range of motion. Neck supple.  Cardiovascular: Normal rate and normal heart sounds.   Pulmonary/Chest: Effort normal and breath sounds normal. No respiratory distress. He has no wheezes. He has no rales.  Musculoskeletal: Normal range of motion.  Lymphadenopathy:    He has no cervical adenopathy.  Neurological: He is alert and oriented to person, place, and time.  Skin: Skin is warm and dry. No rash noted.  Psychiatric: He has a normal mood and affect. His behavior is normal.  Nursing note and vitals reviewed.     Assessment & Plan:   Problem List Items Addressed This Visit    None    Visit Diagnoses    Acute maxillary sinusitis, recurrence not specified    -  Primary   Azithromycin and zyrtec sent. Likely allergy related, discussed starting allergy meds late summer each year to prevent these from occurring yearly.    Relevant Medications   azithromycin (ZITHROMAX) 250 MG tablet   cetirizine (ZYRTEC) 10 MG tablet   Immunization due  Relevant Orders   Flu Vaccine QUAD 36+ mos IM (Completed)    Albuterol prn, recommended neti pot prn.    Follow up plan: Return if symptoms worsen or fail to improve.

## 2016-04-30 DIAGNOSIS — H2513 Age-related nuclear cataract, bilateral: Secondary | ICD-10-CM | POA: Diagnosis not present

## 2016-04-30 DIAGNOSIS — Z0100 Encounter for examination of eyes and vision without abnormal findings: Secondary | ICD-10-CM | POA: Diagnosis not present

## 2016-05-18 ENCOUNTER — Other Ambulatory Visit: Payer: Self-pay | Admitting: Family Medicine

## 2016-06-03 ENCOUNTER — Ambulatory Visit (INDEPENDENT_AMBULATORY_CARE_PROVIDER_SITE_OTHER): Payer: Commercial Managed Care - HMO | Admitting: Family Medicine

## 2016-06-03 ENCOUNTER — Encounter: Payer: Self-pay | Admitting: Family Medicine

## 2016-06-03 VITALS — BP 144/90 | HR 65 | Temp 98.2°F | Wt 260.1 lb

## 2016-06-03 DIAGNOSIS — N401 Enlarged prostate with lower urinary tract symptoms: Secondary | ICD-10-CM | POA: Diagnosis not present

## 2016-06-03 DIAGNOSIS — N410 Acute prostatitis: Secondary | ICD-10-CM | POA: Diagnosis not present

## 2016-06-03 DIAGNOSIS — K921 Melena: Secondary | ICD-10-CM

## 2016-06-03 DIAGNOSIS — N138 Other obstructive and reflux uropathy: Secondary | ICD-10-CM | POA: Diagnosis not present

## 2016-06-03 LAB — UA/M W/RFLX CULTURE, ROUTINE
Bilirubin, UA: NEGATIVE
GLUCOSE, UA: NEGATIVE
KETONES UA: NEGATIVE
LEUKOCYTES UA: NEGATIVE
Nitrite, UA: NEGATIVE
PROTEIN UA: NEGATIVE
RBC, UA: NEGATIVE
Specific Gravity, UA: 1.01 (ref 1.005–1.030)
UUROB: 0.2 mg/dL (ref 0.2–1.0)
pH, UA: 5.5 (ref 5.0–7.5)

## 2016-06-03 LAB — IFOBT (OCCULT BLOOD): IMMUNOLOGICAL FECAL OCCULT BLOOD TEST: POSITIVE

## 2016-06-03 MED ORDER — CIPROFLOXACIN HCL 500 MG PO TABS: 500.0000 mg | ORAL_TABLET | Freq: Two times a day (BID) | ORAL | 0 refills | Status: DC

## 2016-06-03 NOTE — Progress Notes (Signed)
BP (!) 144/90 (BP Location: Left Arm, Patient Position: Sitting, Cuff Size: Large)   Pulse 65   Temp 98.2 F (36.8 C)   Wt 260 lb 1.6 oz (118 kg)   SpO2 96%   BMI 39.55 kg/m    Subjective:    Patient ID: Derrick Ball., male    DOB: 05-05-1956, 60 y.o.   MRN: [id number]  HPI: Derrick Nolette. is a 60 y.o. male  Chief Complaint  Patient presents with  . Blood In Stools   URINARY SYMPTOMS Duration: 2 weeks Dysuria: no Urinary frequency: yes  Hesitancy: No Weak Stream: Yes Nocturia: 4-5x a night Urgency: yes Small volume voids: yes Symptom severity: moderate Urinary incontinence: yes- chronic Foul odor: yes Hematuria: no Abdominal pain: yes Back pain: yes Suprapubic pain/pressure: no Flank pain: no Fever:  no Vomiting: no Relief with cranberry juice: no Relief with pyridium: no Status: worse Previous urinary tract infection: yes Recurrent urinary tract infection: yes Sexual activity: monogomous History of sexually transmitted disease: no Penile discharge: no Treatments attempted: increasing fluids   RECTAL BLEEDING Duration:  1 day Bright red rectal bleeding: yes  Amount of blood: moderate  Frequency: once Melena: yes- for about a week  Spotting on toilet tissue: no  Anal fullness: yes  Perianal pain: no  Severity: moderate Perianal irritation/itching: no  Constipation: yes  Chronic straining/valsava:  no  Anal trauma/intercourse: no  Hemorrhoids: no  Previous colonoscopy: yes   Relevant past medical, surgical, family and social history reviewed and updated as indicated. Interim medical history since our last visit reviewed. Allergies and medications reviewed and updated.  Review of Systems  Constitutional: Negative.   Respiratory: Negative.   Cardiovascular: Negative.   Gastrointestinal: Positive for anal bleeding and blood in stool. Negative for abdominal distention, abdominal pain, constipation, diarrhea, nausea, rectal pain and  vomiting.  Genitourinary: Positive for decreased urine volume, difficulty urinating and frequency. Negative for discharge, dysuria, enuresis, flank pain, genital sores, hematuria, penile pain, penile swelling, scrotal swelling, testicular pain and urgency.  Psychiatric/Behavioral: Negative.     Per HPI unless specifically indicated above     Objective:    BP (!) 144/90 (BP Location: Left Arm, Patient Position: Sitting, Cuff Size: Large)   Pulse 65   Temp 98.2 F (36.8 C)   Wt 260 lb 1.6 oz (118 kg)   SpO2 96%   BMI 39.55 kg/m   Wt Readings from Last 3 Encounters:  06/03/16 260 lb 1.6 oz (118 kg)  04/29/16 257 lb 9.6 oz (116.8 kg)  02/23/16 250 lb (113.4 kg)    Physical Exam  Constitutional: He is oriented to person, place, and time. He appears well-developed and well-nourished. No distress.  HENT:  Head: Normocephalic and atraumatic.  Right Ear: Hearing normal.  Left Ear: Hearing normal.  Nose: Nose normal.  Eyes: Conjunctivae and lids are normal. Right eye exhibits no discharge. Left eye exhibits no discharge. No scleral icterus.  Cardiovascular: Normal rate, regular rhythm, normal heart sounds and intact distal pulses.  Exam reveals no gallop and no friction rub.   No murmur heard. Pulmonary/Chest: Effort normal and breath sounds normal. No respiratory distress. He has no wheezes. He has no rales. He exhibits no tenderness.  Genitourinary: Rectal exam shows guaiac positive stool. Rectal exam shows no external hemorrhoid, no internal hemorrhoid and no fissure. Prostate is enlarged and tender.  Musculoskeletal: Normal range of motion.  Neurological: He is alert and oriented to person, place, and time.  Skin: Skin is warm, dry and intact. No rash noted. No erythema. No pallor.  Psychiatric: He has a normal mood and affect. His speech is normal and behavior is normal. Judgment and thought content normal. Cognition and memory are normal.  Nursing note and vitals  reviewed.   Results for orders placed or performed in visit on 06/03/16  IFOBT POC (occult bld, rslt in office)  Result Value Ref Range   IFOBT Positive       Assessment & Plan:   Problem List Items Addressed This Visit      Digestive   Blood in the stool    ? Due to irritation from the prostatitis. + guaiac. Will recheck in 1 week, if not resolved will send to GI.      Relevant Orders   IFOBT POC (occult bld, rslt in office) (Completed)     Genitourinary   Benign prostatic hyperplasia with lower urinary tract symptoms    Appears to have prostatitis. Will treat.       Relevant Orders   UA/M w/rflx Culture, Routine    Other Visit Diagnoses    Acute prostatitis    -  Primary   Will treat with cipro x 3 weeks. Follow up in 1 week.        Follow up plan: Return in about 1 week (around 06/10/2016) for follow up blood in his stool.

## 2016-06-03 NOTE — Assessment & Plan Note (Signed)
?   Due to irritation from the prostatitis. + guaiac. Will recheck in 1 week, if not resolved will send to GI.

## 2016-06-03 NOTE — Assessment & Plan Note (Signed)
Appears to have prostatitis. Will treat.

## 2016-06-11 ENCOUNTER — Encounter: Payer: Self-pay | Admitting: Family Medicine

## 2016-06-11 ENCOUNTER — Ambulatory Visit (INDEPENDENT_AMBULATORY_CARE_PROVIDER_SITE_OTHER): Payer: Commercial Managed Care - HMO | Admitting: Family Medicine

## 2016-06-11 VITALS — BP 139/84 | HR 60 | Temp 98.0°F | Wt 259.1 lb

## 2016-06-11 DIAGNOSIS — Z1159 Encounter for screening for other viral diseases: Secondary | ICD-10-CM | POA: Diagnosis not present

## 2016-06-11 DIAGNOSIS — K921 Melena: Secondary | ICD-10-CM

## 2016-06-11 DIAGNOSIS — N410 Acute prostatitis: Secondary | ICD-10-CM | POA: Diagnosis not present

## 2016-06-11 LAB — IFOBT (OCCULT BLOOD): IMMUNOLOGICAL FECAL OCCULT BLOOD TEST: NEGATIVE

## 2016-06-11 NOTE — Assessment & Plan Note (Signed)
Resolved. Likely due to irritation of the prostate. Call with any concerns. Complete antibiotics.

## 2016-06-11 NOTE — Progress Notes (Signed)
BP 139/84 (BP Location: Right Arm, Patient Position: Sitting, Cuff Size: Large)   Pulse 60   Temp 98 F (36.7 C)   Wt 259 lb 1.6 oz (117.5 kg)   SpO2 96%   BMI 39.40 kg/m    Subjective:    Patient ID: Derrick Ball., male    DOB: [birth year], 60 y.o.   MRN: [id number]  HPI: Derrick Sandoval. is a 60 y.o. male  Chief Complaint  Patient presents with  . 1 week follow up   Feeling much better. Not in pain any more. No more blood in his stool.  RECTAL BLEEDING Bright red rectal bleeding: no  Amount of blood: None  Melena: no  Spotting on toilet tissue: no  Anal fullness: no  Perianal pain: no  Severity: mild Perianal irritation/itching: yes  Constipation: yes  Chronic straining/valsava:  no  Anal trauma/intercourse: no  Hemorrhoids: no  Previous colonoscopy: yes   Relevant past medical, surgical, family and social history reviewed and updated as indicated. Interim medical history since our last visit reviewed. Allergies and medications reviewed and updated.  Review of Systems  Constitutional: Negative.   Respiratory: Negative.   Cardiovascular: Negative.   Psychiatric/Behavioral: Negative.     Per HPI unless specifically indicated above     Objective:    BP 139/84 (BP Location: Right Arm, Patient Position: Sitting, Cuff Size: Large)   Pulse 60   Temp 98 F (36.7 C)   Wt 259 lb 1.6 oz (117.5 kg)   SpO2 96%   BMI 39.40 kg/m   Wt Readings from Last 3 Encounters:  06/11/16 259 lb 1.6 oz (117.5 kg)  06/03/16 260 lb 1.6 oz (118 kg)  04/29/16 257 lb 9.6 oz (116.8 kg)    Physical Exam  Constitutional: He is oriented to person, place, and time. He appears well-developed and well-nourished. No distress.  HENT:  Head: Normocephalic and atraumatic.  Right Ear: Hearing normal.  Left Ear: Hearing normal.  Nose: Nose normal.  Eyes: Conjunctivae and lids are normal. Right eye exhibits no discharge. Left eye exhibits no discharge. No scleral icterus.    Pulmonary/Chest: Effort normal. No respiratory distress.  Abdominal: Soft. Bowel sounds are normal. He exhibits no distension and no mass. There is no tenderness. There is no rebound and no guarding.  Musculoskeletal: Normal range of motion.  Neurological: He is alert and oriented to person, place, and time.  Skin: Skin is warm, dry and intact. No rash noted. No erythema. No pallor.  Psychiatric: He has a normal mood and affect. His speech is normal and behavior is normal. Judgment and thought content normal. Cognition and memory are normal.  Nursing note and vitals reviewed.   Results for orders placed or performed in visit on 06/11/16  IFOBT POC (occult bld, rslt in office)  Result Value Ref Range   IFOBT Negative       Assessment & Plan:   Problem List Items Addressed This Visit      Digestive   Blood in the stool    Resolved. Likely due to irritation of the prostate. Call with any concerns. Complete antibiotics.       Relevant Orders   IFOBT POC (occult bld, rslt in office) (Completed)    Other Visit Diagnoses    Acute prostatitis    -  Primary   Resolving. Finish course of antibiotics. Call with any concerns.   Encounter for hepatitis C screening test for low risk patient  Labs drawn today, await results.    Relevant Orders   Hepatitis C Antibody       Follow up plan: Return As scheduled.

## 2016-06-12 LAB — HEPATITIS C ANTIBODY: Hep C Virus Ab: <0.1 s/co ratio (ref 0.0–0.9)

## 2016-06-14 ENCOUNTER — Telehealth: Payer: Self-pay | Admitting: Family Medicine

## 2016-06-14 NOTE — Telephone Encounter (Signed)
Can you please let him know that he has not been exposed to hepatitis C. Thanks!

## 2016-06-14 NOTE — Telephone Encounter (Signed)
Patient notified

## 2016-07-20 ENCOUNTER — Other Ambulatory Visit: Payer: Self-pay | Admitting: Family Medicine

## 2016-07-22 ENCOUNTER — Other Ambulatory Visit: Payer: Self-pay | Admitting: Family Medicine

## 2016-08-11 ENCOUNTER — Other Ambulatory Visit: Payer: Self-pay | Admitting: Family Medicine

## 2016-08-25 ENCOUNTER — Encounter: Payer: Self-pay | Admitting: Family Medicine

## 2016-08-25 ENCOUNTER — Ambulatory Visit (INDEPENDENT_AMBULATORY_CARE_PROVIDER_SITE_OTHER): Payer: Medicare HMO | Admitting: Family Medicine

## 2016-08-25 ENCOUNTER — Telehealth: Payer: Self-pay | Admitting: Family Medicine

## 2016-08-25 VITALS — BP 139/93 | HR 69 | Temp 98.4°F | Wt 263.0 lb

## 2016-08-25 DIAGNOSIS — J101 Influenza due to other identified influenza virus with other respiratory manifestations: Secondary | ICD-10-CM | POA: Diagnosis not present

## 2016-08-25 DIAGNOSIS — R7301 Impaired fasting glucose: Secondary | ICD-10-CM | POA: Diagnosis not present

## 2016-08-25 DIAGNOSIS — R7401 Elevation of levels of liver transaminase levels: Secondary | ICD-10-CM

## 2016-08-25 DIAGNOSIS — R509 Fever, unspecified: Secondary | ICD-10-CM | POA: Diagnosis not present

## 2016-08-25 DIAGNOSIS — E782 Mixed hyperlipidemia: Secondary | ICD-10-CM

## 2016-08-25 DIAGNOSIS — I10 Essential (primary) hypertension: Secondary | ICD-10-CM

## 2016-08-25 DIAGNOSIS — R740 Nonspecific elevation of levels of transaminase and lactic acid dehydrogenase [LDH]: Secondary | ICD-10-CM | POA: Diagnosis not present

## 2016-08-25 DIAGNOSIS — R05 Cough: Secondary | ICD-10-CM | POA: Diagnosis not present

## 2016-08-25 DIAGNOSIS — R059 Cough, unspecified: Secondary | ICD-10-CM

## 2016-08-25 LAB — LIPID PANEL PICCOLO, WAIVED
CHOL/HDL RATIO PICCOLO,WAIVE: 3.4 mg/dL
Cholesterol Piccolo, Waived: 135 mg/dL (ref <200)
HDL Chol Piccolo, Waived: 40 mg/dL — ABNORMAL LOW (ref >59)
LDL CHOL CALC PICCOLO WAIVED: 79 mg/dL (ref <100)
Triglycerides Piccolo,Waived: 78 mg/dL (ref <150)
VLDL Chol Calc Piccolo,Waive: 16 mg/dL (ref <30)

## 2016-08-25 LAB — VERITOR FLU A/B WAIVED
INFLUENZA A: POSITIVE — AB
INFLUENZA B: NEGATIVE

## 2016-08-25 LAB — BAYER DCA HB A1C WAIVED: HB A1C: 5.6 % (ref <7.0)

## 2016-08-25 MED ORDER — OSELTAMIVIR PHOSPHATE 75 MG PO CAPS: 75.0000 mg | ORAL_CAPSULE | Freq: Two times a day (BID) | ORAL | 0 refills | Status: DC

## 2016-08-25 NOTE — Telephone Encounter (Signed)
Per Dr Wynetta Emery called patient to explain Tamiflu is the only medication for flu.  Gave him the information [name] offered regarding possibly finding a coupon online to help.

## 2016-08-25 NOTE — Progress Notes (Signed)
BP (!) 139/93 (BP Location: Left Arm, Patient Position: Sitting, Cuff Size: Large)   Pulse 69   Temp 98.4 F (36.9 C)   Wt 263 lb (119.3 kg)   SpO2 97%   BMI 39.99 kg/m    Subjective:    Patient ID: Derrick Sandoval., male    DOB: August 27, 1955, 61 y.o.   MRN: [id number]  HPI: Derrick Sandoval. is a 61 y.o. male  Chief Complaint  Patient presents with  . Diabetes  . Hypertension  . URI    pt states he has been having body aches, chills, congestion, cough, sweats, and a fever since Monday   HYPERTENSION / HYPERLIPIDEMIA Satisfied with current treatment? no Duration of hypertension: chronic BP monitoring frequency: not checking BP medication side effects: no Duration of hyperlipidemia: chronic Cholesterol medication side effects: Not on anything Cholesterol supplements: none  Aspirin: no Recent stressors: no Recurrent headaches: no Visual changes: no Palpitations: no Dyspnea: no Chest pain: no Lower extremity edema: no Dizzy/lightheaded: no  Impaired Fasting Glucose HbA1C: 5.6 Lab Results  Component Value Date   HGBA1C 5.8 (H) 08/13/2015   Duration of elevated blood sugar: chronic Polydipsia: no Polyuria: no Weight change: no Visual disturbance: no Glucose Monitoring: no Diabetic Education: Completed Family history of diabetes: no  UPPER RESPIRATORY TRACT INFECTION Duration: Monday Worst symptom: body aches Fever: yes Cough: yes Shortness of breath: no Wheezing: no Chest pain: no Chest tightness: yes Chest congestion: no Nasal congestion: no Runny nose: no Post nasal drip: no Sneezing: no Sore throat: no Swollen glands: no Sinus pressure: no Headache: no Face pain: no Toothache: no Ear pain: no  Ear pressure: no  Eyes red/itching:no Eye drainage/crusting: no  Vomiting: no Rash: no Fatigue: yes Sick contacts: yes Strep contacts: no  Context: stable Recurrent sinusitis: no Relief with OTC cold/cough medications: no  Treatments  attempted: none   Relevant past medical, surgical, family and social history reviewed and updated as indicated. Interim medical history since our last visit reviewed. Allergies and medications reviewed and updated.  Review of Systems  Constitutional: Positive for chills, fatigue and fever. Negative for activity change, appetite change, diaphoresis and unexpected weight change.  HENT: Positive for congestion, postnasal drip, rhinorrhea, sinus pressure and sneezing. Negative for dental problem, drooling, ear discharge, ear pain, facial swelling, hearing loss, mouth sores, nosebleeds, sinus pain, sore throat, tinnitus, trouble swallowing and voice change.   Respiratory: Positive for cough. Negative for apnea, choking, chest tightness, shortness of breath, wheezing and stridor.   Cardiovascular: Negative.   Psychiatric/Behavioral: Negative.     Per HPI unless specifically indicated above     Objective:    BP (!) 139/93 (BP Location: Left Arm, Patient Position: Sitting, Cuff Size: Large)   Pulse 69   Temp 98.4 F (36.9 C)   Wt 263 lb (119.3 kg)   SpO2 97%   BMI 39.99 kg/m   Wt Readings from Last 3 Encounters:  08/25/16 263 lb (119.3 kg)  06/11/16 259 lb 1.6 oz (117.5 kg)  06/03/16 260 lb 1.6 oz (118 kg)    Physical Exam  Constitutional: He is oriented to person, place, and time. He appears well-developed and well-nourished. He appears distressed.  HENT:  Head: Normocephalic and atraumatic.  Right Ear: Hearing and external ear normal.  Left Ear: Hearing and external ear normal.  Nose: Nose normal.  Mouth/Throat: Oropharynx is clear and moist. No oropharyngeal exudate.  Eyes: Conjunctivae, EOM and lids are normal. Pupils are equal, round,  and reactive to light. Right eye exhibits no discharge. Left eye exhibits no discharge. No scleral icterus.  Cardiovascular: Normal rate, regular rhythm, normal heart sounds and intact distal pulses.  Exam reveals no gallop and no friction rub.     No murmur heard. Pulmonary/Chest: Effort normal and breath sounds normal. No respiratory distress. He has no wheezes. He has no rales. He exhibits no tenderness.  Musculoskeletal: Normal range of motion.  Neurological: He is alert and oriented to person, place, and time.  Skin: Skin is warm, dry and intact. No rash noted. He is not diaphoretic. No erythema. No pallor.  Psychiatric: He has a normal mood and affect. His speech is normal and behavior is normal. Judgment and thought content normal. Cognition and memory are normal.    Results for orders placed or performed in visit on 06/11/16  Hepatitis C Antibody  Result Value Ref Range   Hep C Virus Ab <0.1 0.0 - 0.9 s/co ratio  IFOBT POC (occult bld, rslt in office)  Result Value Ref Range   IFOBT Negative       Assessment & Plan:   Problem List Items Addressed This Visit      Cardiovascular and Mediastinum   Benign hypertension    Stable at this time. Continue current regimen. Continue to monitor. Call with any concerns.       Relevant Orders   Comprehensive metabolic panel   Microalbumin, Urine Waived     Endocrine   IFG (impaired fasting glucose)    A1c at 5.6. Continue diet and exercise. Continue to monitor.       Relevant Orders   Bayer DCA Hb A1c Waived   Comprehensive metabolic panel     Other   Elevated transaminase level    Rechecking levels today. Await results.       Relevant Orders   Comprehensive metabolic panel   Hyperlipidemia    Stable.  Continue diet and exercise. Continue to monitor.       Relevant Orders   Comprehensive metabolic panel   Lipid Panel Piccolo, [place]    Other Visit Diagnoses    Influenza A    -  Primary   Within window. Will treat with tamiflu. Call with any concerns.    Relevant Medications   oseltamivir (TAMIFLU) 75 MG capsule   Cough       +flu   Relevant Orders   Veritor Flu A/B Waived   Fever, unspecified fever cause       +flu   Relevant Orders   Veritor Flu  A/B Waived       Follow up plan: Return in about 6 months (around 02/22/2017) for Physical.

## 2016-08-25 NOTE — Telephone Encounter (Signed)
Patient called regarding the Tamiflu that was prescribed this morning the co pay for it is $100 and he cannot afford this.  Do you have any other suggestions.   Thank You Santiago Glad

## 2016-08-25 NOTE — Assessment & Plan Note (Signed)
Rechecking levels today. Await results.  

## 2016-08-25 NOTE — Assessment & Plan Note (Signed)
Stable.  Continue diet and exercise. Continue to monitor.

## 2016-08-25 NOTE — Assessment & Plan Note (Signed)
Stable at this time. Continue current regimen. Continue to monitor. Call with any concerns.

## 2016-08-25 NOTE — Assessment & Plan Note (Signed)
A1c at 5.6. Continue diet and exercise. Continue to monitor.

## 2016-08-26 LAB — COMPREHENSIVE METABOLIC PANEL
A/G RATIO: 1.7 (ref 1.2–2.2)
ALK PHOS: 57 IU/L (ref 39–117)
ALT: 49 IU/L — ABNORMAL HIGH (ref 0–44)
AST: 39 IU/L (ref 0–40)
Albumin: 4.3 g/dL (ref 3.5–5.5)
BUN/Creatinine Ratio: 15 (ref 9–20)
BUN: 15 mg/dL (ref 6–24)
Bilirubin Total: 0.3 mg/dL (ref 0.0–1.2)
CALCIUM: 9 mg/dL (ref 8.7–10.2)
CO2: 21 mmol/L (ref 18–29)
Chloride: 103 mmol/L (ref 96–106)
Creatinine, Ser: 1.01 mg/dL (ref 0.76–1.27)
GFR calc Af Amer: 94 mL/min/{1.73_m2} (ref >59)
GFR, EST NON AFRICAN AMERICAN: 82 mL/min/{1.73_m2} (ref >59)
GLOBULIN, TOTAL: 2.5 g/dL (ref 1.5–4.5)
Glucose: 102 mg/dL — ABNORMAL HIGH (ref 65–99)
POTASSIUM: 4.3 mmol/L (ref 3.5–5.2)
SODIUM: 142 mmol/L (ref 134–144)
Total Protein: 6.8 g/dL (ref 6.0–8.5)

## 2016-08-30 ENCOUNTER — Other Ambulatory Visit: Payer: Self-pay | Admitting: Family Medicine

## 2016-08-31 ENCOUNTER — Encounter: Payer: Self-pay | Admitting: Family Medicine

## 2016-08-31 ENCOUNTER — Telehealth: Payer: Self-pay

## 2016-08-31 ENCOUNTER — Ambulatory Visit (INDEPENDENT_AMBULATORY_CARE_PROVIDER_SITE_OTHER): Payer: Medicare HMO | Admitting: Family Medicine

## 2016-08-31 VITALS — BP 131/83 | HR 63 | Temp 98.1°F | Wt 264.8 lb

## 2016-08-31 DIAGNOSIS — J1100 Influenza due to unidentified influenza virus with unspecified type of pneumonia: Secondary | ICD-10-CM

## 2016-08-31 MED ORDER — ALBUTEROL SULFATE HFA 108 (90 BASE) MCG/ACT IN AERS: 2.0000 | INHALATION_SPRAY | Freq: Four times a day (QID) | RESPIRATORY_TRACT | 0 refills | Status: DC | PRN

## 2016-08-31 MED ORDER — LEVOFLOXACIN 750 MG PO TABS: 750.0000 mg | ORAL_TABLET | Freq: Every day | ORAL | 0 refills | Status: DC

## 2016-08-31 NOTE — Telephone Encounter (Signed)
Refilled yesterday. 

## 2016-08-31 NOTE — Telephone Encounter (Signed)
RX Refill Nystatin 10000 U  Apply twice a day

## 2016-08-31 NOTE — Progress Notes (Signed)
BP 131/83 (BP Location: Left Arm, Patient Position: Sitting, Cuff Size: Normal)   Pulse 63   Temp 98.1 F (36.7 C)   Wt 264 lb 12.8 oz (120.1 kg)   SpO2 95%   BMI 40.26 kg/m    Subjective:    Patient ID: Derrick Sandoval., male    DOB: 28-Aug-1955, 61 y.o.   MRN: [id number]  HPI: Derrick Sandoval. is a 61 y.o. male  Chief Complaint  Patient presents with  . Influenza  . Cough  . Chest Congestion   UPPER RESPIRATORY TRACT INFECTION- diagnosed with the flu 6 days ago Worst symptom: cough Fever: yes Cough: yes Shortness of breath: yes Wheezing: yes Chest pain: yes, with cough Chest tightness: yes Chest congestion: yes Nasal congestion: yes Runny nose: yes Post nasal drip: yes Sneezing: no Sore throat: no Swollen glands: no Sinus pressure: no Headache: yes Face pain: no Toothache: no Ear pain: no Ear pressure: no  Eyes red/itching:no Eye drainage/crusting: no  Vomiting: no Rash: no Fatigue: yes Sick contacts: yes Strep contacts: no  Context: worse Recurrent sinusitis: no Relief with OTC cold/cough medications: no  Treatments attempted: cough syrup   Relevant past medical, surgical, family and social history reviewed and updated as indicated. Interim medical history since our last visit reviewed. Allergies and medications reviewed and updated.  Review of Systems  Constitutional: Negative.   HENT: Positive for congestion, postnasal drip, rhinorrhea, sinus pain, sinus pressure and sore throat. Negative for dental problem, drooling, ear discharge, ear pain, facial swelling, hearing loss, mouth sores, nosebleeds, sneezing, tinnitus, trouble swallowing and voice change.   Respiratory: Positive for cough, chest tightness, shortness of breath and wheezing. Negative for apnea, choking and stridor.   Cardiovascular: Negative.   Psychiatric/Behavioral: Negative.     Per HPI unless specifically indicated above     Objective:    BP 131/83 (BP Location: Left  Arm, Patient Position: Sitting, Cuff Size: Normal)   Pulse 63   Temp 98.1 F (36.7 C)   Wt 264 lb 12.8 oz (120.1 kg)   SpO2 95%   BMI 40.26 kg/m   Wt Readings from Last 3 Encounters:  08/31/16 264 lb 12.8 oz (120.1 kg)  08/25/16 263 lb (119.3 kg)  06/11/16 259 lb 1.6 oz (117.5 kg)    Physical Exam  Constitutional: He is oriented to person, place, and time. He appears well-developed and well-nourished. No distress.  HENT:  Head: Normocephalic and atraumatic.  Right Ear: Hearing and external ear normal.  Left Ear: Hearing and external ear normal.  Nose: Nose normal.  Mouth/Throat: Oropharynx is clear and moist. No oropharyngeal exudate.  Eyes: Conjunctivae, EOM and lids are normal. Pupils are equal, round, and reactive to light. Right eye exhibits no discharge. Left eye exhibits no discharge. No scleral icterus.  Neck: Normal range of motion. Neck supple. No JVD present. No tracheal deviation present. No thyromegaly present.  Cardiovascular: Normal rate, regular rhythm, normal heart sounds and intact distal pulses.  Exam reveals no gallop and no friction rub.   No murmur heard. Pulmonary/Chest: Effort normal. No stridor. No respiratory distress. He has decreased breath sounds in the right lower field. He has no wheezes. He has no rales. He exhibits no tenderness.  Musculoskeletal: Normal range of motion.  Lymphadenopathy:    He has no cervical adenopathy.  Neurological: He is alert and oriented to person, place, and time.  Skin: Skin is warm, dry and intact. No rash noted. He is not diaphoretic.  No erythema. No pallor.  Psychiatric: He has a normal mood and affect. His speech is normal and behavior is normal. Judgment and thought content normal. Cognition and memory are normal.  Nursing note and vitals reviewed.   Results for orders placed or performed in visit on 08/25/16  Veritor Flu A/B Waived  Result Value Ref Range   Influenza A Positive (A) Negative   Influenza B Negative  Negative  Bayer DCA Hb A1c Waived  Result Value Ref Range   Bayer DCA Hb A1c Waived 5.6 <7.0 %  Comprehensive metabolic panel  Result Value Ref Range   Glucose 102 (H) 65 - 99 mg/dL   BUN 15 6 - 24 mg/dL   Creatinine, Ser 1.01 0.76 - 1.27 mg/dL   GFR calc non Af Amer 82 >59 mL/min/1.73   GFR calc Af Amer 94 >59 mL/min/1.73   BUN/Creatinine Ratio 15 9 - 20   Sodium 142 134 - 144 mmol/L   Potassium 4.3 3.5 - 5.2 mmol/L   Chloride 103 96 - 106 mmol/L   CO2 21 18 - 29 mmol/L   Calcium 9.0 8.7 - 10.2 mg/dL   Total Protein 6.8 6.0 - 8.5 g/dL   Albumin 4.3 3.5 - 5.5 g/dL   Globulin, Total 2.5 1.5 - 4.5 g/dL   Albumin/Globulin Ratio 1.7 1.2 - 2.2   Bilirubin Total 0.3 0.0 - 1.2 mg/dL   Alkaline Phosphatase 57 39 - 117 IU/L   AST 39 0 - 40 IU/L   ALT 49 (H) 0 - 44 IU/L  Lipid Panel [place], Waived  Result Value Ref Range   Cholesterol Piccol[place], Waived 135 <200 mg/dL   HDL Chol [place], Waived 40 (L) >59 mg/dL   Triglycerides Piccolo,Waived 78 <150 mg/dL   Chol/HDL Ratio Piccolo,Waive 3.4 mg/dL   LDL Chol Calc [place] Waived 79 <100 mg/dL   VLDL Chol Calc Piccolo,Waive 16 <30 mg/dL      Assessment & Plan:   Problem List Items Addressed This Visit    None    Visit Diagnoses    Pneumonia of left lower lobe due to influenza A virus    -  Primary   Will treat with levaquin. Refill of albuterol sent to the pharmacy. Recheck lungs 2 weeks.    Relevant Medications   albuterol (PROVENTIL HFA;VENTOLIN HFA) 108 (90 Base) MCG/ACT inhaler   levofloxacin (LEVAQUIN) 750 MG tablet       Follow up plan: Return in about 2 weeks (around 09/14/2016) for Lung recheck.

## 2016-09-10 ENCOUNTER — Encounter: Payer: Self-pay | Admitting: Family Medicine

## 2016-09-10 ENCOUNTER — Other Ambulatory Visit: Payer: Medicare HMO

## 2016-09-10 ENCOUNTER — Ambulatory Visit (INDEPENDENT_AMBULATORY_CARE_PROVIDER_SITE_OTHER): Payer: Medicare HMO | Admitting: Family Medicine

## 2016-09-10 VITALS — BP 139/75 | HR 87 | Temp 98.5°F | Wt 264.9 lb

## 2016-09-10 DIAGNOSIS — I10 Essential (primary) hypertension: Secondary | ICD-10-CM

## 2016-09-10 DIAGNOSIS — J1100 Influenza due to unidentified influenza virus with unspecified type of pneumonia: Secondary | ICD-10-CM

## 2016-09-10 LAB — MICROALBUMIN, URINE WAIVED
Creatinine, Urine Waived: 50 mg/dL (ref 10–300)
Microalb, Ur Waived: 10 mg/L (ref 0–19)

## 2016-09-10 NOTE — Progress Notes (Signed)
BP 139/75 (BP Location: Right Arm, Patient Position: Sitting, Cuff Size: Large)   Pulse 87   Temp 98.5 F (36.9 C)   Wt 264 lb 14.4 oz (120.2 kg)   SpO2 94%   BMI 40.28 kg/m    Subjective:    Patient ID: Derrick Ball., male    DOB: September 27, 1955, 61 y.o.   MRN: [id number]  HPI: Derrick Hammontree. is a 61 y.o. male  Chief Complaint  Patient presents with  . LUNG RECHECK   Still feeling a little rough. He notes that he's still congested in his nose. He has not had any fevers. No chills. He is otherwise feeling well with no other concerns today.  Relevant past medical, surgical, family and social history reviewed and updated as indicated. Interim medical history since our last visit reviewed. Allergies and medications reviewed and updated.  Review of Systems  Constitutional: Negative.   Respiratory: Negative.   Cardiovascular: Negative.   Psychiatric/Behavioral: Negative.     Per HPI unless specifically indicated above     Objective:    BP 139/75 (BP Location: Right Arm, Patient Position: Sitting, Cuff Size: Large)   Pulse 87   Temp 98.5 F (36.9 C)   Wt 264 lb 14.4 oz (120.2 kg)   SpO2 94%   BMI 40.28 kg/m   Wt Readings from Last 3 Encounters:  09/10/16 264 lb 14.4 oz (120.2 kg)  08/31/16 264 lb 12.8 oz (120.1 kg)  08/25/16 263 lb (119.3 kg)    Physical Exam  Constitutional: He is oriented to person, place, and time. He appears well-developed and well-nourished. No distress.  HENT:  Head: Normocephalic and atraumatic.  Right Ear: Hearing normal.  Left Ear: Hearing normal.  Nose: Nose normal.  Eyes: Conjunctivae and lids are normal. Right eye exhibits no discharge. Left eye exhibits no discharge. No scleral icterus.  Cardiovascular: Normal rate, regular rhythm, normal heart sounds and intact distal pulses.  Exam reveals no gallop and no friction rub.   No murmur heard. Pulmonary/Chest: Effort normal and breath sounds normal. No respiratory distress. He  has no wheezes. He has no rales. He exhibits no tenderness.  Musculoskeletal: Normal range of motion.  Neurological: He is alert and oriented to person, place, and time.  Skin: Skin is warm, dry and intact. No rash noted. No erythema. No pallor.  Psychiatric: He has a normal mood and affect. His speech is normal and behavior is normal. Judgment and thought content normal. Cognition and memory are normal.  Nursing note and vitals reviewed.   Results for orders placed or performed in visit on 08/25/16  Veritor Flu A/B Waived  Result Value Ref Range   Influenza A Positive (A) Negative   Influenza B Negative Negative  Bayer DCA Hb A1c Waived  Result Value Ref Range   Bayer DCA Hb A1c Waived 5.6 <7.0 %  Comprehensive metabolic panel  Result Value Ref Range   Glucose 102 (H) 65 - 99 mg/dL   BUN 15 6 - 24 mg/dL   Creatinine, Ser 1.01 0.76 - 1.27 mg/dL   GFR calc non Af Amer 82 >59 mL/min/1.73   GFR calc Af Amer 94 >59 mL/min/1.73   BUN/Creatinine Ratio 15 9 - 20   Sodium 142 134 - 144 mmol/L   Potassium 4.3 3.5 - 5.2 mmol/L   Chloride 103 96 - 106 mmol/L   CO2 21 18 - 29 mmol/L   Calcium 9.0 8.7 - 10.2 mg/dL   Total Protein  6.8 6.0 - 8.5 g/dL   Albumin 4.3 3.5 - 5.5 g/dL   Globulin, Total 2.5 1.5 - 4.5 g/dL   Albumin/Globulin Ratio 1.7 1.2 - 2.2   Bilirubin Total 0.3 0.0 - 1.2 mg/dL   Alkaline Phosphatase 57 39 - 117 IU/L   AST 39 0 - 40 IU/L   ALT 49 (H) 0 - 44 IU/L  Lipid Panel Piccolo, Waived  Result Value Ref Range   Cholesterol Piccolo, Waived 135 <200 mg/dL   HDL Chol Piccolo, Waived 40 (L) >59 mg/dL   Triglycerides Piccolo,Waived 78 <150 mg/dL   Chol/HDL Ratio Piccolo,Waive 3.4 mg/dL   LDL Chol Calc Piccolo Waived 79 <100 mg/dL   VLDL Chol Calc Piccolo,Waive 16 <30 mg/dL      Assessment & Plan:   Problem List Items Addressed This Visit    None    Visit Diagnoses    Pneumonia of left lower lobe due to influenza A virus    -  Primary   Resolved. Call with any  concerns.        Follow up plan: Return As scheduled.

## 2016-11-09 ENCOUNTER — Other Ambulatory Visit: Payer: Self-pay | Admitting: Family Medicine

## 2016-11-19 ENCOUNTER — Other Ambulatory Visit: Payer: Self-pay | Admitting: Family Medicine

## 2016-11-23 ENCOUNTER — Other Ambulatory Visit: Payer: Self-pay | Admitting: Family Medicine

## 2016-12-01 ENCOUNTER — Telehealth: Payer: Self-pay

## 2016-12-01 MED ORDER — DICLOFENAC SODIUM 1 % TD GEL: 4.0000 g | Freq: Four times a day (QID) | TRANSDERMAL | 3 refills | Status: DC

## 2016-12-01 NOTE — Telephone Encounter (Signed)
Please refill his Voltaren gel

## 2016-12-10 ENCOUNTER — Encounter: Payer: Self-pay | Admitting: Family Medicine

## 2016-12-10 ENCOUNTER — Ambulatory Visit (INDEPENDENT_AMBULATORY_CARE_PROVIDER_SITE_OTHER): Payer: Medicare HMO | Admitting: Family Medicine

## 2016-12-10 VITALS — BP 141/90 | HR 78 | Temp 98.1°F | Wt 272.0 lb

## 2016-12-10 DIAGNOSIS — J069 Acute upper respiratory infection, unspecified: Secondary | ICD-10-CM

## 2016-12-10 DIAGNOSIS — R3911 Hesitancy of micturition: Secondary | ICD-10-CM

## 2016-12-10 LAB — UA/M W/RFLX CULTURE, ROUTINE
Bilirubin, UA: NEGATIVE
Glucose, UA: NEGATIVE
Ketones, UA: NEGATIVE
LEUKOCYTES UA: NEGATIVE
Nitrite, UA: NEGATIVE
PH UA: 6 (ref 5.0–7.5)
PROTEIN UA: NEGATIVE
RBC, UA: NEGATIVE
Specific Gravity, UA: 1.02 (ref 1.005–1.030)
Urobilinogen, Ur: 0.2 mg/dL (ref 0.2–1.0)

## 2016-12-10 LAB — MICROSCOPIC EXAMINATION: Bacteria, UA: NONE SEEN

## 2016-12-10 MED ORDER — AZITHROMYCIN 250 MG PO TABS: ORAL_TABLET | ORAL | 0 refills | Status: DC

## 2016-12-10 MED ORDER — AMOXICILLIN-POT CLAVULANATE 875-125 MG PO TABS: 1.0000 | ORAL_TABLET | Freq: Two times a day (BID) | ORAL | 0 refills | Status: DC

## 2016-12-10 NOTE — Progress Notes (Signed)
BP (!) 141/90   Pulse 78   Temp 98.1 F (36.7 C)   Wt 272 lb (123.4 kg)   SpO2 97%   BMI 41.36 kg/m    Subjective:    Patient ID: Derrick Sandoval., male    DOB: 10-25-1955, 61 y.o.   MRN: [id number]  HPI: Paige Vanderwoude. is a 61 y.o. male tx for PNA 3 mo ago presenting today with a complaint of head congestion, productive cough, headache, and upper body muscle aches x 3 days. He denies fever, ear pain, eye sx, sore throat, wheezing, and SOB. He is also experiencing urinary sx including hesitancy, urgency, and rectal pain, has hx of prostatitis.   UA is WNL.   Chief Complaint  Patient presents with  . URI    x 2 days, head/chest congestion, productive cough, sore throat, headache, sinus pressure and drainage runny nose, shoulders ache. No fever, no ear ache.    Relevant past medical, surgical, family and social history reviewed and updated as indicated. Interim medical history since our last visit reviewed. Allergies and medications reviewed and updated.  Review of Systems  Constitutional: Positive for diaphoresis. Negative for fever.  HENT: Positive for congestion, postnasal drip and sinus pain. Negative for ear pain, rhinorrhea and sore throat.   Eyes: Negative.   Respiratory: Positive for cough. Negative for shortness of breath and wheezing.   Cardiovascular: Negative.   Gastrointestinal: Positive for rectal pain.  Endocrine: Negative.   Genitourinary: Positive for difficulty urinating and urgency. Dysuria: hesitentcy   Musculoskeletal: Positive for myalgias (shoulders and neck ).  Skin: Negative.   Allergic/Immunologic: Positive for environmental allergies.  Neurological: Positive for headaches.  Hematological: Negative.   Psychiatric/Behavioral: Negative.     Per HPI unless specifically indicated above     Objective:    BP (!) 141/90   Pulse 78   Temp 98.1 F (36.7 C)   Wt 272 lb (123.4 kg)   SpO2 97%   BMI 41.36 kg/m   Wt Readings from Last 3  Encounters:  12/13/16 274 lb 8 oz (124.5 kg)  12/10/16 272 lb (123.4 kg)  09/10/16 264 lb 14.4 oz (120.2 kg)    Physical Exam  Constitutional: He is oriented to person, place, and time. He appears well-developed and well-nourished. No distress.  HENT:  Head: Normocephalic and atraumatic.  Right Ear: Tympanic membrane and external ear normal.  Left Ear: Tympanic membrane and external ear normal.  Nose: Nose normal.  Mouth/Throat: Oropharynx is clear and moist. No oropharyngeal exudate.  Eyes: Conjunctivae are normal. Right eye exhibits no discharge. Left eye exhibits no discharge. No scleral icterus.  Neck: Neck supple.  Cardiovascular: Normal rate, regular rhythm and normal heart sounds.   Pulmonary/Chest: Effort normal and breath sounds normal. No respiratory distress. He has no wheezes. He has no rales. He exhibits no tenderness.  Abdominal: Bowel sounds are normal.  Genitourinary:  Genitourinary Comments: Prostate exam declined  Musculoskeletal: Normal range of motion.  Lymphadenopathy:    He has no cervical adenopathy.  Neurological: He is alert and oriented to person, place, and time.  Skin: Skin is warm and dry. He is not diaphoretic.  Psychiatric: He has a normal mood and affect. His behavior is normal.      Assessment & Plan:   Problem List Items Addressed This Visit    None    Visit Diagnoses    Viral URI    -  Primary   Discussed OTC remedies, humidifier,  rest. If worsening or no improvement over the next 3-4 days, can start augmentin.    Urinary hesitancy       U/A WNL, sxs very mild currently and mostly related to the constipation. Will continue to monitor sxs and work on relieving constipation    Relevant Orders   UA/M w/rflx Culture, Routine (Completed)       Follow up plan: Return if symptoms worsen or fail to improve.

## 2016-12-13 ENCOUNTER — Ambulatory Visit (INDEPENDENT_AMBULATORY_CARE_PROVIDER_SITE_OTHER): Payer: Medicare HMO | Admitting: Family Medicine

## 2016-12-13 ENCOUNTER — Encounter: Payer: Self-pay | Admitting: Family Medicine

## 2016-12-13 VITALS — BP 166/94 | HR 61 | Temp 97.4°F | Wt 274.5 lb

## 2016-12-13 DIAGNOSIS — R194 Change in bowel habit: Secondary | ICD-10-CM

## 2016-12-13 DIAGNOSIS — R635 Abnormal weight gain: Secondary | ICD-10-CM | POA: Diagnosis not present

## 2016-12-13 DIAGNOSIS — R740 Nonspecific elevation of levels of transaminase and lactic acid dehydrogenase [LDH]: Secondary | ICD-10-CM | POA: Diagnosis not present

## 2016-12-13 DIAGNOSIS — L259 Unspecified contact dermatitis, unspecified cause: Secondary | ICD-10-CM | POA: Diagnosis not present

## 2016-12-13 DIAGNOSIS — K921 Melena: Secondary | ICD-10-CM

## 2016-12-13 DIAGNOSIS — R140 Abdominal distension (gaseous): Secondary | ICD-10-CM | POA: Diagnosis not present

## 2016-12-13 DIAGNOSIS — R7401 Elevation of levels of liver transaminase levels: Secondary | ICD-10-CM

## 2016-12-13 LAB — BAYER DCA HB A1C WAIVED: HB A1C (BAYER DCA - WAIVED): 5.3 %

## 2016-12-13 MED ORDER — TRIAMCINOLONE ACETONIDE 0.5 % EX OINT: 1.0000 "application " | TOPICAL_OINTMENT | Freq: Two times a day (BID) | CUTANEOUS | 0 refills | Status: DC

## 2016-12-13 NOTE — Patient Instructions (Signed)
Follow up as needed

## 2016-12-13 NOTE — Progress Notes (Signed)
BP (!) 166/94 (BP Location: Left Arm, Patient Position: Sitting, Cuff Size: Large)   Pulse 61   Temp 97.4 F (36.3 C)   Wt 274 lb 8 oz (124.5 kg)   SpO2 98%   BMI 41.74 kg/m    Subjective:    Patient ID: Derrick Ball., male    DOB: 1955/10/20, 61 y.o.   MRN: [id number]  HPI: Derrick Lindstrom. is a 61 y.o. male  Chief Complaint  Patient presents with  . Weight Gain   WEIGHT GAIN- has been watching what he eats and walking, but has been gaining weight.  Breakfast- oatmeal and 1/2 bagel- 2-3 bottles of water, 1 coffee with creamer Lunch- Salad, diet green tea and diet coke Dinner- chicken and veggies 2-3 glasses of 2% milk Occasional gold fish 15-20 1x a day  Exercise- Walking about 10 miles on a treadmill a week. Usually walking about 3.5 mph  Duration: 6-7 months Previous attempts at weight loss: yes Complications of obesity: HTN Peak weight:  274 Weight loss goal: Wants to get down to 38 waist Weight loss to date: -2lbs Requesting obesity pharmacotherapy: no Current weight loss supplements/medications: no Previous weight loss supplements/meds: no   Relevant past medical, surgical, family and social history reviewed and updated as indicated. Interim medical history since our last visit reviewed. Allergies and medications reviewed and updated.  Review of Systems  Constitutional: Positive for unexpected weight change. Negative for activity change, appetite change, chills, diaphoresis, fatigue and fever.  Respiratory: Negative.   Cardiovascular: Negative.   Gastrointestinal: Positive for abdominal distention, blood in stool (previously, but not now) and constipation. Negative for abdominal pain, anal bleeding, diarrhea, nausea, rectal pain and vomiting.  Psychiatric/Behavioral: Negative.     Per HPI unless specifically indicated above     Objective:    BP (!) 166/94 (BP Location: Left Arm, Patient Position: Sitting, Cuff Size: Large)   Pulse 61   Temp  97.4 F (36.3 C)   Wt 274 lb 8 oz (124.5 kg)   SpO2 98%   BMI 41.74 kg/m   Wt Readings from Last 3 Encounters:  12/13/16 274 lb 8 oz (124.5 kg)  12/10/16 272 lb (123.4 kg)  09/10/16 264 lb 14.4 oz (120.2 kg)    Physical Exam  Constitutional: He is oriented to person, place, and time. He appears well-developed and well-nourished. No distress.  HENT:  Head: Normocephalic and atraumatic.  Right Ear: Hearing normal.  Left Ear: Hearing normal.  Nose: Nose normal.  Eyes: Conjunctivae and lids are normal. Right eye exhibits no discharge. Left eye exhibits no discharge. No scleral icterus.  Cardiovascular: Normal rate, regular rhythm, normal heart sounds and intact distal pulses.  Exam reveals no gallop and no friction rub.   No murmur heard. Pulmonary/Chest: Effort normal and breath sounds normal. No respiratory distress. He has no wheezes. He has no rales. He exhibits no tenderness.  Abdominal: Bowel sounds are normal. He exhibits distension. He exhibits no mass. There is no tenderness. There is no rebound and no guarding.  Musculoskeletal: Normal range of motion.  Neurological: He is alert and oriented to person, place, and time.  Skin: Skin is warm, dry and intact. Rash (under R arm) noted. He is not diaphoretic. No erythema. No pallor.  Psychiatric: He has a normal mood and affect. His speech is normal and behavior is normal. Judgment and thought content normal. Cognition and memory are normal.  Nursing note and vitals reviewed.   Results for  orders placed or performed in visit on 12/10/16  Microscopic Examination  Result Value Ref Range   WBC, UA 0-5 0 - 5 /hpf   RBC, UA 0-2 0 - 2 /hpf   Epithelial Cells (non renal) 0-10 0 - 10 /hpf   Bacteria, UA None seen None seen/Few  UA/M w/rflx Culture, Routine  Result Value Ref Range   Specific Gravity, UA 1.020 1.005 - 1.030   pH, UA 6.0 5.0 - 7.5   Color, UA Yellow Yellow   Appearance Ur Clear Clear   Leukocytes, UA Negative Negative    Protein, UA Negative Negative/Trace   Glucose, UA Negative Negative   Ketones, UA Negative Negative   RBC, UA Negative Negative   Bilirubin, UA Negative Negative   Urobilinogen, Ur 0.2 0.2 - 1.0 mg/dL   Nitrite, UA Negative Negative   Microscopic Examination See below:       Assessment & Plan:   Problem List Items Addressed This Visit      Digestive   Blood in the stool   Relevant Orders   CT Abdomen Pelvis W Contrast     Other   Morbid obesity ([place])    Checking labs and CT. Await results. Call with any concerns. Discussed options of nutritionist. He and wife are interested in possible weight loss surgery. Await labs and continue to monitor.       Relevant Orders   CT Abdomen Pelvis W Contrast   Elevated transaminase level    Rechecking levels today. Await results.       Relevant Orders   CT Abdomen Pelvis W Contrast    Other Visit Diagnoses    Weight gain    -  Primary   Watching diet and exercise. Will check labs for other causes, given bowel habit changes and abdominal distention, will obtain CT of abdomen and pelvis.    Relevant Orders   Bayer DCA Hb A1c Waived   CBC with Differential/Platelet   Comprehensive metabolic panel   TSH   CT Abdomen Pelvis W Contrast   Change in bowel habits       Colonoscopy 2009 normal, due next year. Given bowel habit changes and abdominal distention, will obtain CT of abdomen and pelvis.    Relevant Orders   CT Abdomen Pelvis W Contrast   Abdominal distension       Colonoscopy 2009 normal, due next year. Given bowel habit changes and abdominal distention, will obtain CT of abdomen and pelvis.    Relevant Orders   CT Abdomen Pelvis W Contrast   Contact dermatitis, unspecified contact dermatitis type, unspecified trigger       Will treat with triamcinalone. Rx sent to his pharmacy.       Follow up plan: Return Pending Results.

## 2016-12-13 NOTE — Assessment & Plan Note (Signed)
Checking labs and CT. Await results. Call with any concerns. Discussed options of nutritionist. He and wife are interested in possible weight loss surgery. Await labs and continue to monitor.

## 2016-12-13 NOTE — Assessment & Plan Note (Signed)
Rechecking levels today. Await results.  

## 2016-12-14 LAB — CBC WITH DIFFERENTIAL/PLATELET
BASOS: 1 %
Basophils Absolute: 0 10*3/uL (ref 0.0–0.2)
EOS (ABSOLUTE): 0.2 10*3/uL (ref 0.0–0.4)
EOS: 4 %
HEMATOCRIT: 45.6 % (ref 37.5–51.0)
HEMOGLOBIN: 15.5 g/dL (ref 13.0–17.7)
Immature Grans (Abs): 0 10*3/uL (ref 0.0–0.1)
Immature Granulocytes: 0 %
LYMPHS ABS: 2.4 10*3/uL (ref 0.7–3.1)
Lymphs: 44 %
MCH: 30.9 pg (ref 26.6–33.0)
MCHC: 34 g/dL (ref 31.5–35.7)
MCV: 91 fL (ref 79–97)
MONOCYTES: 6 %
MONOS ABS: 0.4 10*3/uL (ref 0.1–0.9)
NEUTROS ABS: 2.5 10*3/uL (ref 1.4–7.0)
Neutrophils: 45 %
Platelets: 199 10*3/uL (ref 150–379)
RBC: 5.02 x10E6/uL (ref 4.14–5.80)
RDW: 14.4 % (ref 12.3–15.4)
WBC: 5.5 10*3/uL (ref 3.4–10.8)

## 2016-12-14 LAB — COMPREHENSIVE METABOLIC PANEL
A/G RATIO: 2.1 (ref 1.2–2.2)
ALBUMIN: 4.5 g/dL (ref 3.5–5.5)
ALK PHOS: 66 IU/L (ref 39–117)
ALT: 42 IU/L (ref 0–44)
AST: 33 IU/L (ref 0–40)
BILIRUBIN TOTAL: 0.4 mg/dL (ref 0.0–1.2)
BUN / CREAT RATIO: 20 (ref 9–20)
BUN: 18 mg/dL (ref 6–24)
CO2: 23 mmol/L (ref 18–29)
CREATININE: 0.9 mg/dL (ref 0.76–1.27)
Calcium: 9.4 mg/dL (ref 8.7–10.2)
Chloride: 105 mmol/L (ref 96–106)
GFR calc Af Amer: 108 mL/min/{1.73_m2} (ref >59)
GFR calc non Af Amer: 93 mL/min/{1.73_m2} (ref >59)
GLOBULIN, TOTAL: 2.1 g/dL (ref 1.5–4.5)
Glucose: 132 mg/dL — ABNORMAL HIGH (ref 65–99)
POTASSIUM: 4.3 mmol/L (ref 3.5–5.2)
SODIUM: 143 mmol/L (ref 134–144)
Total Protein: 6.6 g/dL (ref 6.0–8.5)

## 2016-12-14 LAB — TSH: TSH: 3.69 u[IU]/mL (ref 0.450–4.500)

## 2016-12-17 ENCOUNTER — Telehealth: Payer: Self-pay | Admitting: Family Medicine

## 2016-12-17 ENCOUNTER — Ambulatory Visit
Admission: RE | Admit: 2016-12-17 | Discharge: 2016-12-17 | Disposition: A | Discharge status: Home / Self Care | Payer: Medicare HMO | Source: Ambulatory Visit | Attending: Family Medicine | Admitting: Family Medicine

## 2016-12-17 DIAGNOSIS — I700 Atherosclerosis of aorta: Secondary | ICD-10-CM | POA: Diagnosis not present

## 2016-12-17 DIAGNOSIS — R635 Abnormal weight gain: Secondary | ICD-10-CM | POA: Diagnosis present

## 2016-12-17 DIAGNOSIS — R194 Change in bowel habit: Secondary | ICD-10-CM

## 2016-12-17 DIAGNOSIS — R7401 Elevation of levels of liver transaminase levels: Secondary | ICD-10-CM

## 2016-12-17 DIAGNOSIS — R140 Abdominal distension (gaseous): Secondary | ICD-10-CM

## 2016-12-17 DIAGNOSIS — K760 Fatty (change of) liver, not elsewhere classified: Secondary | ICD-10-CM | POA: Diagnosis not present

## 2016-12-17 DIAGNOSIS — K439 Ventral hernia without obstruction or gangrene: Secondary | ICD-10-CM | POA: Insufficient documentation

## 2016-12-17 DIAGNOSIS — K921 Melena: Secondary | ICD-10-CM

## 2016-12-17 DIAGNOSIS — R740 Nonspecific elevation of levels of transaminase and lactic acid dehydrogenase [LDH]: Secondary | ICD-10-CM

## 2016-12-17 DIAGNOSIS — K6289 Other specified diseases of anus and rectum: Secondary | ICD-10-CM | POA: Insufficient documentation

## 2016-12-17 MED ORDER — IOPAMIDOL (ISOVUE-370) INJECTION 76%
100.0000 mL | Freq: Once | INTRAVENOUS | Status: AC | PRN
  Administered 2016-12-17: 100 mL via INTRAVENOUS

## 2016-12-17 NOTE — Telephone Encounter (Signed)
Patient notified

## 2016-12-17 NOTE — Telephone Encounter (Signed)
Please let him know that his CT was normal and just showed that he was a little constipated. He should start some OTC miralax. But everything else looked great and there was no sign of anything funny. Thanks!

## 2016-12-20 ENCOUNTER — Telehealth: Payer: Self-pay | Admitting: Family Medicine

## 2016-12-20 NOTE — Telephone Encounter (Signed)
Pt would like to know if he should be taking docusate sodium (COLACE) 100 MG capsule and Sennosides (SENNA LAXATIVE PO) while taking miralax.

## 2016-12-20 NOTE — Telephone Encounter (Signed)
He can- but he's going to go a lot. That's fine for right now until he gets cleaned out though.

## 2016-12-20 NOTE — Telephone Encounter (Signed)
Patient notified

## 2017-01-15 ENCOUNTER — Other Ambulatory Visit: Payer: Self-pay | Admitting: Family Medicine

## 2017-01-24 ENCOUNTER — Encounter: Payer: Self-pay | Admitting: Family Medicine

## 2017-01-24 ENCOUNTER — Ambulatory Visit (INDEPENDENT_AMBULATORY_CARE_PROVIDER_SITE_OTHER): Payer: Medicare HMO | Admitting: Family Medicine

## 2017-01-24 DIAGNOSIS — I10 Essential (primary) hypertension: Secondary | ICD-10-CM

## 2017-01-24 MED ORDER — TAMSULOSIN HCL 0.4 MG PO CAPS: 0.4000 mg | ORAL_CAPSULE | Freq: Two times a day (BID) | ORAL | 1 refills | Status: DC

## 2017-01-24 MED ORDER — ACYCLOVIR 400 MG PO TABS: 400.0000 mg | ORAL_TABLET | Freq: Two times a day (BID) | ORAL | 1 refills | Status: DC

## 2017-01-24 NOTE — Assessment & Plan Note (Signed)
Slightly elevated today. Will watch diet and work on [employer]. Recheck 6 months.

## 2017-01-24 NOTE — Patient Instructions (Addendum)
DASH Eating Plan DASH stands for "Dietary Approaches to Stop Hypertension." The DASH eating plan is a healthy eating plan that has been shown to reduce high blood pressure (hypertension). It may also reduce your risk for type 2 diabetes, heart disease, and stroke. The DASH eating plan may also help with weight loss. What are tips for following this plan? General guidelines  Avoid eating more than 2,300 mg (milligrams) of salt (sodium) a day. If you have hypertension, you may need to reduce your sodium intake to 1,500 mg a day.  Limit alcohol intake to no more than 1 drink a day for nonpregnant women and 2 drinks a day for men. One drink equals 12 oz of beer, 5 oz of wine, or 1 oz of hard liquor.  Work with your health care provider to maintain a healthy body weight or to lose weight. Ask what an ideal weight is for you.  Get at least 30 minutes of exercise that causes your heart to beat faster (aerobic exercise) most days of the week. Activities may include walking, swimming, or biking.  Work with your health care provider or diet and nutrition specialist (dietitian) to adjust your eating plan to your individual calorie needs. Reading food labels  Check food labels for the amount of sodium per serving. Choose foods with less than 5 percent of the Daily Value of sodium. Generally, foods with less than 300 mg of sodium per serving fit into this eating plan.  To find whole grains, look for the word "whole" as the first word in the ingredient list. Shopping  Buy products labeled as "low-sodium" or "no salt added."  Buy fresh foods. Avoid canned foods and premade or frozen meals. Cooking  Avoid adding salt when cooking. Use salt-free seasonings or herbs instead of table salt or sea salt. Check with your health care provider or pharmacist before using salt substitutes.  Do not fry foods. Schicker foods using healthy methods such as baking, boiling, grilling, and broiling instead.  [place] with  heart-healthy oils, such as olive, canola, soybean, or sunflower oil. Meal planning   Eat a balanced diet that includes: ? 5 or more servings of fruits and vegetables each day. At each meal, try to fill half of your plate with fruits and vegetables. ? Up to 6-8 servings of whole grains each day. ? Less than 6 oz of lean meat, poultry, or fish each day. A 3-oz serving of meat is about the same size as a deck of cards. One egg equals 1 oz. ? 2 servings of low-fat dairy each day. ? A serving of nuts, seeds, or beans 5 times each week. ? Heart-healthy fats. Healthy fats called Omega-3 fatty acids are found in foods such as flaxseeds and coldwater fish, like sardines, salmon, and mackerel.  Limit how much you eat of the following: ? Canned or prepackaged foods. ? Food that is high in trans fat, such as fried foods. ? Food that is high in saturated fat, such as fatty meat. ? Sweets, desserts, sugary drinks, and other foods with added sugar. ? Full-fat dairy products.  Do not salt foods before eating.  Try to eat at least 2 vegetarian meals each week.  Eat more home-cooked food and less restaurant, buffet, and fast food.  When eating at a restaurant, ask that your food be prepared with less salt or no salt, if possible. What foods are recommended? The items listed may not be a complete list. Talk with your dietitian about what   dietary choices are best for you. Grains Whole-grain or whole-wheat bread. Whole-grain or whole-wheat pasta. Brown rice. Oatmeal. Quinoa. Bulgur. Whole-grain and low-sodium cereals. Pita bread. Low-fat, low-sodium crackers. Whole-wheat flour tortillas. Vegetables Fresh or frozen vegetables (raw, steamed, roasted, or grilled). Low-sodium or reduced-sodium tomato and vegetable juice. Low-sodium or reduced-sodium tomato sauce and tomato paste. Low-sodium or reduced-sodium canned vegetables. Fruits All fresh, dried, or frozen fruit. Canned fruit in natural juice (without  added sugar). Meat and other protein foods Skinless chicken or turkey. Ground chicken or turkey. Pork with fat trimmed off. Fish and seafood. Egg whites. Dried beans, peas, or lentils. Unsalted nuts, nut butters, and seeds. Unsalted canned beans. Lean cuts of beef with fat trimmed off. Low-sodium, lean deli meat. Dairy Low-fat (1%) or fat-free (skim) milk. Fat-free, low-fat, or reduced-fat cheeses. Nonfat, low-sodium ricotta or cottage cheese. Low-fat or nonfat yogurt. Low-fat, low-sodium cheese. Fats and oils Soft margarine without trans fats. Vegetable oil. Low-fat, reduced-fat, or light mayonnaise and salad dressings (reduced-sodium). Canola, safflower, olive, soybean, and sunflower oils. Avocado. Seasoning and other foods Herbs. Spices. Seasoning mixes without salt. Unsalted popcorn and pretzels. Fat-free sweets. What foods are not recommended? The items listed may not be a complete list. Talk with your dietitian about what dietary choices are best for you. Grains Baked goods made with fat, such as croissants, muffins, or some breads. Dry pasta or rice meal packs. Vegetables Creamed or fried vegetables. Vegetables in a cheese sauce. Regular canned vegetables (not low-sodium or reduced-sodium). Regular canned tomato sauce and paste (not low-sodium or reduced-sodium). Regular tomato and vegetable juice (not low-sodium or reduced-sodium). Pickles. Olives. Fruits Canned fruit in a light or heavy syrup. Fried fruit. Fruit in cream or butter sauce. Meat and other protein foods Fatty cuts of meat. Ribs. Fried meat. Bacon. Sausage. Bologna and other processed lunch meats. Salami. Fatback. Hotdogs. Bratwurst. Salted nuts and seeds. Canned beans with added salt. Canned or smoked fish. Whole eggs or egg yolks. Chicken or turkey with skin. Dairy Whole or 2% milk, cream, and half-and-half. Whole or full-fat cream cheese. Whole-fat or sweetened yogurt. Full-fat cheese. Nondairy creamers. Whipped toppings.  Processed cheese and cheese spreads. Fats and oils Butter. Stick margarine. Lard. Shortening. Ghee. Bacon fat. Tropical oils, such as coconut, palm kernel, or palm oil. Seasoning and other foods Salted popcorn and pretzels. Onion salt, garlic salt, seasoned salt, table salt, and sea salt. Worcestershire sauce. Tartar sauce. Barbecue sauce. Teriyaki sauce. Soy sauce, including reduced-sodium. Steak sauce. Canned and packaged gravies. Fish sauce. Oyster sauce. Cocktail sauce. Horseradish that you find on the shelf. Ketchup. Mustard. Meat flavorings and tenderizers. Bouillon cubes. Hot sauce and Tabasco sauce. Premade or packaged marinades. Premade or packaged taco seasonings. Relishes. Regular salad dressings. Where to find more information:  National Heart, Lung, and Blood Institute: www.nhlbi.nih.gov  American Heart Association: www.heart.org Summary  The DASH eating plan is a healthy eating plan that has been shown to reduce high blood pressure (hypertension). It may also reduce your risk for type 2 diabetes, heart disease, and stroke.  With the DASH eating plan, you should limit salt (sodium) intake to 2,300 mg a day. If you have hypertension, you may need to reduce your sodium intake to 1,500 mg a day.  When on the DASH eating plan, aim to eat more fresh fruits and vegetables, whole grains, lean proteins, low-fat dairy, and heart-healthy fats.  Work with your health care provider or diet and nutrition specialist (dietitian) to adjust your eating plan to your individual   calorie needs. This information is not intended to replace advice given to you by your health care provider. Make sure you discuss any questions you have with your health care provider. Document Released: 07/22/2011 Document Revised: 07/26/2016 Document Reviewed: 07/26/2016 Elsevier Interactive Patient Education  2017 [employer]  

## 2017-01-24 NOTE — Progress Notes (Signed)
BP (!) 148/80 (BP Location: Right Arm, Patient Position: Sitting, Cuff Size: Large)   Pulse 65   Temp 97.9 F (36.6 C)   Wt 274 lb 1.6 oz (124.3 kg)   SpO2 98%   BMI 41.68 kg/m    Subjective:    Patient ID: Derrick Sandoval., male    DOB: 21-Sep-1955, 61 y.o.   MRN: [id number]  HPI: Derrick Sandoval. is a 61 y.o. male  Chief Complaint  Patient presents with  . Weight Check  . Medication Refill    Acyclovir and Flomax   WEIGHT GAIN- would like to go to the bariatric clinic Duration: chronic Previous attempts at weight loss: yes Complications of obesity:  Peak weight: HLD, HTN,  Weight loss goal: 40-42 pant Weight loss to date: 1/2 lb Requesting obesity pharmacotherapy: no Current weight loss supplements/medications: no Previous weight loss supplements/meds: no  HYPERTENSION Hypertension status: stable  Satisfied with current treatment? yes Duration of hypertension: chronic BP monitoring frequency:  not checking BP range:  BP medication side effects:  no Medication compliance: excellent compliance Previous BP meds: flomax Aspirin: yes Recurrent headaches: no Visual changes: no Palpitations: no Dyspnea: no Chest pain: no Lower extremity edema: no Dizzy/lightheaded: no  Relevant past medical, surgical, family and social history reviewed and updated as indicated. Interim medical history since our last visit reviewed. Allergies and medications reviewed and updated.  Review of Systems  Constitutional: Negative.   Respiratory: Negative.   Cardiovascular: Negative.   Psychiatric/Behavioral: Negative.     Per HPI unless specifically indicated above     Objective:    BP (!) 148/80 (BP Location: Right Arm, Patient Position: Sitting, Cuff Size: Large)   Pulse 65   Temp 97.9 F (36.6 C)   Wt 274 lb 1.6 oz (124.3 kg)   SpO2 98%   BMI 41.68 kg/m   Wt Readings from Last 3 Encounters:  01/24/17 274 lb 1.6 oz (124.3 kg)  12/13/16 274 lb 8 oz (124.5 kg)    12/10/16 272 lb (123.4 kg)    Physical Exam  Constitutional: He is oriented to person, place, and time. He appears well-developed and well-nourished. No distress.  HENT:  Head: Normocephalic and atraumatic.  Right Ear: Hearing normal.  Left Ear: Hearing normal.  Nose: Nose normal.  Eyes: Conjunctivae and lids are normal. Right eye exhibits no discharge. Left eye exhibits no discharge. No scleral icterus.  Cardiovascular: Normal rate, regular rhythm and intact distal pulses.  Exam reveals no gallop and no friction rub.   No murmur heard. Pulmonary/Chest: Effort normal and breath sounds normal. No respiratory distress. He has no wheezes. He has no rales. He exhibits no tenderness.  Musculoskeletal: Normal range of motion.  Neurological: He is alert and oriented to person, place, and time.  Skin: Skin is warm, dry and intact. No rash noted. He is not diaphoretic. No erythema. No pallor.  Psychiatric: He has a normal mood and affect. His speech is normal and behavior is normal. Judgment and thought content normal. Cognition and memory are normal.  Nursing note and vitals reviewed.   Results for orders placed or performed in visit on 12/13/16  Bayer DCA Hb A1c Waived  Result Value Ref Range   Bayer DCA Hb A1c Waived 5.3 <7.0 %  CBC with Differential/Platelet  Result Value Ref Range   WBC 5.5 3.4 - 10.8 x10E3/uL   RBC 5.02 4.14 - 5.80 x10E6/uL   Hemoglobin 15.5 13.0 - 17.7 g/dL   Hematocrit  45.6 37.5 - 51.0 %   MCV 91 79 - 97 fL   MCH 30.9 26.6 - 33.0 pg   MCHC 34.0 31.5 - 35.7 g/dL   RDW 14.4 12.3 - 15.4 %   Platelets 199 150 - 379 x10E3/uL   Neutrophils 45 Not Estab. %   Lymphs 44 Not Estab. %   Monocytes 6 Not Estab. %   Eos 4 Not Estab. %   Basos 1 Not Estab. %   Neutrophils Absolute 2.5 1.4 - 7.0 x10E3/uL   Lymphocytes Absolute 2.4 0.7 - 3.1 x10E3/uL   Monocytes Absolute 0.4 0.1 - 0.9 x10E3/uL   EOS (ABSOLUTE) 0.2 0.0 - 0.4 x10E3/uL   Basophils Absolute 0.0 0.0 - 0.2  x10E3/uL   Immature Granulocytes 0 Not Estab. %   Immature Grans (Abs) 0.0 0.0 - 0.1 x10E3/uL  Comprehensive metabolic panel  Result Value Ref Range   Glucose 132 (H) 65 - 99 mg/dL   BUN 18 6 - 24 mg/dL   Creatinine, Ser 0.90 0.76 - 1.27 mg/dL   GFR calc non Af Amer 93 >59 mL/min/1.73   GFR calc Af Amer 108 >59 mL/min/1.73   BUN/Creatinine Ratio 20 9 - 20   Sodium 143 134 - 144 mmol/L   Potassium 4.3 3.5 - 5.2 mmol/L   Chloride 105 96 - 106 mmol/L   CO2 23 18 - 29 mmol/L   Calcium 9.4 8.7 - 10.2 mg/dL   Total Protein 6.6 6.0 - 8.5 g/dL   Albumin 4.5 3.5 - 5.5 g/dL   Globulin, Total 2.1 1.5 - 4.5 g/dL   Albumin/Globulin Ratio 2.1 1.2 - 2.2   Bilirubin Total 0.4 0.0 - 1.2 mg/dL   Alkaline Phosphatase 66 39 - 117 IU/L   AST 33 0 - 40 IU/L   ALT 42 0 - 44 IU/L  TSH  Result Value Ref Range   TSH 3.690 0.450 - 4.500 uIU/mL      Assessment & Plan:   Problem List Items Addressed This Visit      Cardiovascular and Mediastinum   Benign hypertension    Slightly elevated today. Will watch diet and work on [employer]. Recheck 6 months.         Other   Morbid obesity ([place])    Would like to see bariatric clinic. Referral generated today. Continue diet and exercise.           Follow up plan: Return in about 6 months (around 07/26/2017) for Physical.

## 2017-01-24 NOTE — Assessment & Plan Note (Signed)
Would like to see bariatric clinic. Referral generated today. Continue diet and exercise.

## 2017-02-08 ENCOUNTER — Telehealth: Payer: Self-pay

## 2017-02-08 ENCOUNTER — Encounter: Payer: Self-pay | Admitting: Family Medicine

## 2017-02-08 ENCOUNTER — Ambulatory Visit (INDEPENDENT_AMBULATORY_CARE_PROVIDER_SITE_OTHER): Payer: Medicare HMO | Admitting: Family Medicine

## 2017-02-08 VITALS — BP 153/91 | HR 89 | Temp 98.0°F | Wt 271.0 lb

## 2017-02-08 DIAGNOSIS — J309 Allergic rhinitis, unspecified: Secondary | ICD-10-CM

## 2017-02-08 DIAGNOSIS — B9789 Other viral agents as the cause of diseases classified elsewhere: Secondary | ICD-10-CM | POA: Diagnosis not present

## 2017-02-08 DIAGNOSIS — J069 Acute upper respiratory infection, unspecified: Secondary | ICD-10-CM

## 2017-02-08 MED ORDER — LORATADINE 10 MG PO TABS: 10.0000 mg | ORAL_TABLET | Freq: Every day | ORAL | 11 refills | Status: DC

## 2017-02-08 MED ORDER — BENZONATATE 100 MG PO CAPS: 200.0000 mg | ORAL_CAPSULE | Freq: Three times a day (TID) | ORAL | 0 refills | Status: DC | PRN

## 2017-02-08 MED ORDER — HYDROCOD POLST-CPM POLST ER 10-8 MG/5ML PO SUER: 5.0000 mL | Freq: Two times a day (BID) | ORAL | 0 refills | Status: DC | PRN

## 2017-02-08 NOTE — Progress Notes (Signed)
   BP (!) 153/91   Pulse 89   Temp 98 F (36.7 C)   Wt 271 lb (122.9 kg)   SpO2 97%   BMI 41.21 kg/m    Subjective:    Patient ID: Derrick Sandoval., male    DOB: 05/13/1956, 61 y.o.   MRN: [id number]  HPI: Derrick Sandoval. is a 61 y.o. male  Chief Complaint  Patient presents with  . URI    x 3 days, head and chest congestion, cough-non productive, scratchy throat, muscles feel tight and sore, some ear pressure, headache. No fever, no runny nose.    3 day hx of congestion, dry cough, scratchy throat, ear pressure, and HA. Denies fever, chills, body aches, SOB, wheezing. Trying dayquil, nyquil, and tylenol with minimal relief. Grandchildren have been sick recently.   Relevant past medical, surgical, family and social history reviewed and updated as indicated. Interim medical history since our last visit reviewed. Allergies and medications reviewed and updated.  Review of Systems  Constitutional: Negative.   HENT: Positive for congestion, ear pain and sore throat.   Eyes: Negative.   Respiratory: Positive for cough.   Gastrointestinal: Negative.   Genitourinary: Negative.   Musculoskeletal: Negative.   Neurological: Positive for headaches.  Psychiatric/Behavioral: Negative.    Per HPI unless specifically indicated above     Objective:    BP (!) 153/91   Pulse 89   Temp 98 F (36.7 C)   Wt 271 lb (122.9 kg)   SpO2 97%   BMI 41.21 kg/m   Wt Readings from Last 3 Encounters:  02/08/17 271 lb (122.9 kg)  01/24/17 274 lb 1.6 oz (124.3 kg)  12/13/16 274 lb 8 oz (124.5 kg)    Physical Exam  Constitutional: He is oriented to person, place, and time. He appears well-developed and well-nourished. No distress.  HENT:  Head: Atraumatic.  Mild middle ear effusion Posterior oropharynx erythematous Nasal mucosa injected  Eyes: Conjunctivae are normal. Pupils are equal, round, and reactive to light.  Neck: Normal range of motion. Neck supple.  Cardiovascular: Normal  rate and normal heart sounds.   Pulmonary/Chest: Effort normal and breath sounds normal. No respiratory distress.  Musculoskeletal: Normal range of motion.  Lymphadenopathy:    He has no cervical adenopathy.  Neurological: He is alert and oriented to person, place, and time.  Skin: Skin is warm and dry.  Psychiatric: He has a normal mood and affect. His behavior is normal.  Nursing note and vitals reviewed.     Assessment & Plan:   Problem List Items Addressed This Visit      Respiratory   Rhinitis, allergic    Discussed importance of a good allergy regimen. Will get on claritin and flonase regimen. Discussed proper technique for flonase use.        Other Visit Diagnoses    Viral URI with cough    -  Primary   Continue OTC medications for symptomatic relief. Start allergy regimen. Tessalon and tussionex sent. Precautions reviewed. Discussed supportive care.       Follow up plan: Return if symptoms worsen or fail to improve.

## 2017-02-08 NOTE — Telephone Encounter (Signed)
Per Pharmacy, the rx for Tussionex can only be filled for a qty of 70ml per the "STOP ACT".

## 2017-02-09 MED ORDER — HYDROCOD POLST-CPM POLST ER 10-8 MG/5ML PO SUER: 5.0000 mL | Freq: Two times a day (BID) | ORAL | 0 refills | Status: DC | PRN

## 2017-02-09 NOTE — Telephone Encounter (Signed)
Script changed to 50 mL and printed, ready to pick up

## 2017-02-09 NOTE — Telephone Encounter (Signed)
I spoke with the pharmacy and they do not need a new rx. They automatically adjusted the qty to 62ml. New rx for the 90ml was shredded.

## 2017-02-10 NOTE — Assessment & Plan Note (Signed)
Discussed importance of a good allergy regimen. Will get on claritin and flonase regimen. Discussed proper technique for flonase use.

## 2017-02-14 ENCOUNTER — Telehealth: Payer: Self-pay | Admitting: Family Medicine

## 2017-02-14 ENCOUNTER — Encounter: Payer: Self-pay | Admitting: Family Medicine

## 2017-02-14 MED ORDER — AZITHROMYCIN 250 MG PO TABS: ORAL_TABLET | ORAL | 0 refills | Status: DC

## 2017-02-14 NOTE — Telephone Encounter (Signed)
Pt called and would like to know if he could have something sent to [facility] Derrick Sandoval for a sinus infection.

## 2017-02-14 NOTE — Telephone Encounter (Signed)
Called and let patient know about medication being sent in and that he should be seen if not better after taking antibiotic per [name]. Patient verbalized understanding.

## 2017-02-14 NOTE — Telephone Encounter (Signed)
Routing to provider. Would patient need to be seen first?

## 2017-02-14 NOTE — Telephone Encounter (Signed)
Patient has been sick for at least 10 days per last note. Rx sent to his pharmacy. If not getting better, will need to be seen.

## 2017-02-22 ENCOUNTER — Telehealth: Payer: Self-pay | Admitting: Family Medicine

## 2017-02-22 ENCOUNTER — Encounter: Payer: Medicare HMO | Admitting: Family Medicine

## 2017-02-22 ENCOUNTER — Encounter: Payer: Self-pay | Admitting: Family Medicine

## 2017-02-22 ENCOUNTER — Ambulatory Visit (INDEPENDENT_AMBULATORY_CARE_PROVIDER_SITE_OTHER): Payer: Medicare HMO | Admitting: Family Medicine

## 2017-02-22 VITALS — BP 175/101 | HR 75 | Temp 97.5°F | Ht 67.2 in | Wt 271.2 lb

## 2017-02-22 DIAGNOSIS — K760 Fatty (change of) liver, not elsewhere classified: Secondary | ICD-10-CM | POA: Diagnosis not present

## 2017-02-22 DIAGNOSIS — E782 Mixed hyperlipidemia: Secondary | ICD-10-CM | POA: Diagnosis not present

## 2017-02-22 DIAGNOSIS — N401 Enlarged prostate with lower urinary tract symptoms: Secondary | ICD-10-CM

## 2017-02-22 DIAGNOSIS — N138 Other obstructive and reflux uropathy: Secondary | ICD-10-CM | POA: Diagnosis not present

## 2017-02-22 DIAGNOSIS — K219 Gastro-esophageal reflux disease without esophagitis: Secondary | ICD-10-CM | POA: Diagnosis not present

## 2017-02-22 DIAGNOSIS — A6000 Herpesviral infection of urogenital system, unspecified: Secondary | ICD-10-CM | POA: Diagnosis not present

## 2017-02-22 DIAGNOSIS — Z0000 Encounter for general adult medical examination without abnormal findings: Secondary | ICD-10-CM | POA: Diagnosis not present

## 2017-02-22 DIAGNOSIS — E559 Vitamin D deficiency, unspecified: Secondary | ICD-10-CM

## 2017-02-22 DIAGNOSIS — I10 Essential (primary) hypertension: Secondary | ICD-10-CM | POA: Diagnosis not present

## 2017-02-22 LAB — UA/M W/RFLX CULTURE, ROUTINE
Bilirubin, UA: NEGATIVE
Glucose, UA: NEGATIVE
Ketones, UA: NEGATIVE
LEUKOCYTES UA: NEGATIVE
NITRITE UA: NEGATIVE
PH UA: 6 (ref 5.0–7.5)
Protein, UA: NEGATIVE
RBC, UA: NEGATIVE
Specific Gravity, UA: 1.015 (ref 1.005–1.030)
Urobilinogen, Ur: 0.2 mg/dL (ref 0.2–1.0)

## 2017-02-22 LAB — MICROALBUMIN, URINE WAIVED
Creatinine, Urine Waived: 50 mg/dL (ref 10–300)
Microalb, Ur Waived: 10 mg/L (ref 0–19)

## 2017-02-22 MED ORDER — LISINOPRIL 10 MG PO TABS: 10.0000 mg | ORAL_TABLET | Freq: Every day | ORAL | 3 refills | Status: DC

## 2017-02-22 NOTE — Assessment & Plan Note (Signed)
Under good control. Continue current regimen. Continue to monitor. Call with any concerns. 

## 2017-02-22 NOTE — Telephone Encounter (Addendum)
Called patient to go over 6CIT, advanced directives and other doctors that he sees. Please transfer to clinical if he calls back.

## 2017-02-22 NOTE — Progress Notes (Signed)
BP (!) 175/101 (BP Location: Left Arm, Patient Position: Sitting, Cuff Size: Large)   Pulse 75   Temp (!) 97.5 F (36.4 C)   Ht 5' 7.2" (1.707 m)   Wt 271 lb 4 oz (123 kg)   SpO2 96%   BMI 42.23 kg/m    Subjective:    Patient ID: Derrick Ball., male    DOB: January 22, 1956, 61 y.o.   MRN: [id number]  HPI: Derrick Murata. is a 61 y.o. male presenting on 02/22/2017 for comprehensive medical examination. Current medical complaints include:  HYPERTENSION / HYPERLIPIDEMIA Satisfied with current treatment? no Duration of hypertension: chronic BP monitoring frequency: not checking BP medication side effects: Not on anything Past BP meds: lisinopril, amlodipine Duration of hyperlipidemia: chronic Cholesterol medication side effects: not on anything Cholesterol supplements: none Aspirin: yes Recent stressors: yes Recurrent headaches: yes Visual changes: no Palpitations: no Dyspnea: no Chest pain: no Lower extremity edema: no Dizzy/lightheaded: no  BPH BPH status: controlled Satisfied with current treatment?: yes Medication side effects: no Medication compliance: excellent compliance Duration: chronic Nocturia: 5+ times per night Urinary frequency:yes Incomplete voiding: no Urgency: yes Weak urinary stream: no Straining to start stream: no Dysuria: no Onset: gradual Severity: moderate  He currently lives with: wife Interim Problems from his last visit: no  Functional Status Survey: Is the patient deaf or have difficulty hearing?: No Does the patient have difficulty seeing, even when wearing glasses/contacts?: No Does the patient have difficulty concentrating, remembering, or making decisions?: No Does the patient have difficulty walking or climbing stairs?: No Does the patient have difficulty dressing or bathing?: No Does the patient have difficulty doing errands alone such as visiting a doctor's office or shopping?: No  Fall Risk  02/22/2017 01/24/2017    Falls in the past year? No No    Depression Screen done today and results listed below:  Depression screen [facility] 2/9 02/22/2017 01/24/2017  Decreased Interest 0 0  Down, Depressed, Hopeless 0 0  PHQ - 2 Score 0 0  Altered sleeping 3 3  Tired, decreased energy 3 1  Change in appetite 0 3  Feeling bad or failure about yourself  0 1  Trouble concentrating 0 2  Moving slowly or fidgety/restless 0 0  Suicidal thoughts 0 0  PHQ-9 Score 6 10     Past Medical History:  Past Medical History:  Diagnosis Date  . Allergy   . GERD (gastroesophageal reflux disease)   . Hyperlipidemia   . Hypertension     Surgical History:  Past Surgical History:  Procedure Laterality Date  . JOINT REPLACEMENT Bilateral   . NASAL SINUS SURGERY    . TOTAL KNEE ARTHROPLASTY      Medications:  Current Outpatient Prescriptions on File Prior to Visit  Medication Sig  . acyclovir (ZOVIRAX) 400 MG tablet Take 1 tablet (400 mg total) by mouth 2 (two) times daily.  Marland Kitchen aspirin EC 81 MG tablet Take 81 mg by mouth daily.  . cholecalciferol (VITAMIN D) 1000 UNITS tablet Take 2,000 Units by mouth daily.   . fluticasone (FLONASE) 50 MCG/ACT nasal spray USE 1 SPRAY IN EACH NOSTRIL TWICE DAILY  . loratadine (CLARITIN) 10 MG tablet Take 1 tablet (10 mg total) by mouth daily.  . Multiple Vitamins-Minerals (MULTIVITAMIN WITH MINERALS) tablet Take 1 tablet by mouth daily.  Marland Kitchen nystatin cream (MYCOSTATIN) APPLY CREAM TOPICALLY TWICE DAILY  . ranitidine (ZANTAC) 300 MG tablet TAKE 1 TABLET AT BEDTIME  . tamsulosin (FLOMAX) 0.4  MG CAPS capsule Take 1 capsule (0.4 mg total) by mouth 2 (two) times daily.  Marland Kitchen triamcinolone ointment (KENALOG) 0.5 % Apply 1 application topically 2 (two) times daily.   No current facility-administered medications on file prior to visit.     Allergies:  No Known Allergies  Social History:  Social History   Social History  . Marital status: Married    Spouse name: N/A  . Number of children:  N/A  . Years of education: N/A   Occupational History  . Not on file.   Social History Main Topics  . Smoking status: Never Smoker  . Smokeless tobacco: Never Used  . Alcohol use 0.0 oz/week     Comment: occasionally, twice a year   . Drug use: No  . Sexual activity: Not on file   Other Topics Concern  . Not on file   Social History Narrative  . No narrative on file   History  Smoking Status  . Never Smoker  Smokeless Tobacco  . Never Used   History  Alcohol Use  . 0.0 oz/week    Comment: occasionally, twice a year     Family History:  Family History  Problem Relation Age of Onset  . Cancer Mother        Breast  . Cancer Father        unsure, passed away before it was confirmed  . Cancer Daughter        pancreatic  . Diabetes Neg Hx   . Heart disease Neg Hx   . Hypertension Neg Hx   . Stroke Neg Hx   . COPD Neg Hx     Past medical history, surgical history, medications, allergies, family history and social history reviewed with patient today and changes made to appropriate areas of the chart.   Review of Systems  Constitutional: Negative.   HENT: Negative.   Eyes: Negative.   Respiratory: Positive for cough. Negative for hemoptysis, sputum production, shortness of breath and wheezing.   Cardiovascular: Negative.   Gastrointestinal: Positive for abdominal pain. Negative for blood in stool, constipation, diarrhea, heartburn, melena, nausea and vomiting.  Genitourinary: Positive for frequency and urgency. Negative for dysuria, flank pain and hematuria.  Musculoskeletal: Negative.   Skin: Positive for rash (yeast infection under his arm). Negative for itching.  Neurological: Negative.   Endo/Heme/Allergies: Positive for environmental allergies. Negative for polydipsia. Does not bruise/bleed easily.  Psychiatric/Behavioral: Negative.     All other ROS negative except what is listed above and in the HPI.      Objective:    BP (!) 175/101 (BP Location:  Left Arm, Patient Position: Sitting, Cuff Size: Large)   Pulse 75   Temp (!) 97.5 F (36.4 C)   Ht 5' 7.2" (1.707 m)   Wt 271 lb 4 oz (123 kg)   SpO2 96%   BMI 42.23 kg/m   Wt Readings from Last 3 Encounters:  02/22/17 271 lb 4 oz (123 kg)  02/08/17 271 lb (122.9 kg)  01/24/17 274 lb 1.6 oz (124.3 kg)    Physical Exam  Constitutional: He is oriented to person, place, and time. He appears well-developed and well-nourished. No distress.  HENT:  Head: Normocephalic and atraumatic.  Right Ear: Hearing, tympanic membrane, external ear and ear canal normal.  Left Ear: Hearing, tympanic membrane, external ear and ear canal normal.  Nose: Nose normal.  Mouth/Throat: Uvula is midline, oropharynx is clear and moist and mucous membranes are normal. No oropharyngeal exudate.  Eyes: Conjunctivae, EOM and lids are normal. Pupils are equal, round, and reactive to light. Right eye exhibits no discharge. Left eye exhibits no discharge. No scleral icterus.  Neck: Normal range of motion. Neck supple. No JVD present. No tracheal deviation present. No thyromegaly present.  Cardiovascular: Normal rate, regular rhythm, normal heart sounds and intact distal pulses.  Exam reveals no gallop and no friction rub.   No murmur heard. Pulmonary/Chest: Effort normal and breath sounds normal. No stridor. No respiratory distress. He has no wheezes. He has no rales. He exhibits no tenderness.  Abdominal: Soft. Bowel sounds are normal. He exhibits no distension and no mass. There is no tenderness. There is no rebound and no guarding. Hernia confirmed negative in the right inguinal area and confirmed negative in the left inguinal area.  Genitourinary: Prostate normal, testes normal and penis normal. Rectal exam shows anal tone abnormal. Rectal exam shows no external hemorrhoid, no internal hemorrhoid, no fissure, no mass and no tenderness. Prostate is not enlarged and not tender. Cremasteric reflex is present. Right testis  shows no mass, no swelling and no tenderness. Right testis is descended. Cremasteric reflex is not absent on the right side. Left testis shows no mass, no swelling and no tenderness. Left testis is descended. Cremasteric reflex is not absent on the left side. No penile tenderness.  Musculoskeletal: Normal range of motion. He exhibits no edema, tenderness or deformity.  Lymphadenopathy:    He has no cervical adenopathy.  Neurological: He is alert and oriented to person, place, and time. He has normal reflexes. He displays normal reflexes. No cranial nerve deficit. He exhibits normal muscle tone. Coordination normal.  Skin: Skin is warm, dry and intact. Rash (erythematous, well defined rash under R arm) noted. He is not diaphoretic. No erythema. No pallor.  Psychiatric: He has a normal mood and affect. His speech is normal and behavior is normal. Judgment and thought content normal. Cognition and memory are normal.  Nursing note and vitals reviewed.  6CIT Screen 02/22/2017  What Year? 0 points  What month? 0 points  What time? 0 points  Count back from 20 0 points  Months in reverse 0 points  Repeat phrase 0 points  Total Score 0     Results for orders placed or performed in visit on 02/22/17  Microalbumin, Urine Waived  Result Value Ref Range   Microalb, Ur Waived 10 0 - 19 mg/L   Creatinine, Urine Waived 50 10 - 300 mg/dL   Microalb/Creat Ratio 30-300 (H) <30 mg/g  UA/M w/rflx Culture, Routine  Result Value Ref Range   Specific Gravity, UA 1.015 1.005 - 1.030   pH, UA 6.0 5.0 - 7.5   Color, UA Yellow Yellow   Appearance Ur Clear Clear   Leukocytes, UA Negative Negative   Protein, UA Negative Negative/Trace   Glucose, UA Negative Negative   Ketones, UA Negative Negative   RBC, UA Negative Negative   Bilirubin, UA Negative Negative   Urobilinogen, Ur 0.2 0.2 - 1.0 mg/dL   Nitrite, UA Negative Negative      Assessment & Plan:   Problem List Items Addressed This Visit       Cardiovascular and Mediastinum   Benign hypertension    Not under good control. Will start lisinopril. Recheck 1 month. Call with any concerns.       Relevant Medications   lisinopril (PRINIVIL,ZESTRIL) 10 MG tablet   Other Relevant Orders   Microalbumin, Urine Waived (Completed)   TSH  Digestive   Fatty liver    Rechecking levels today. Await results.       GERD (gastroesophageal reflux disease)    Under good control. Continue current regimen. Continue to monitor. Call with any concerns.       Relevant Orders   CBC with Differential/Platelet     Genitourinary   Benign prostatic hyperplasia with lower urinary tract symptoms    Under good control. Continue current regimen. Continue to monitor. Call with any concerns.       Relevant Orders   PSA   UA/M w/rflx Culture, Routine (Completed)   Genital herpes    Under good control. Continue current regimen. Continue to monitor. Call with any concerns.         Other   Hyperlipidemia    Under good control. Continue current regimen. Continue to monitor. Call with any concerns.       Relevant Medications   lisinopril (PRINIVIL,ZESTRIL) 10 MG tablet   Other Relevant Orders   Comprehensive metabolic panel   Lipid Panel w/o Chol/HDL Ratio   Vitamin D deficiency    Rechecking levels today. Will treat as needed.       Relevant Orders   VITAMIN D 25 Hydroxy (Vit-D Deficiency, Fractures)    Other Visit Diagnoses    Medicare annual wellness visit, subsequent    -  Primary   Preventative care discussed as below. Continue to monitor.    Routine general medical examination at a health care facility       Vaccines up to date. Screening labs checked today. Colonoscopy up to date. Continue diet and exercise continue to monitor.       Preventative Services:  Health Risk Assessment and Personalized Prevention Plan: Done today Bone Mass Measurements: N/A CVD Screening: Done today Colon Cancer Screening: up to date Depression  Screening: done today Diabetes Screening: done today Glaucoma Screening: see your eye doctor Hepatitis B vaccine: N/A Hepatitis C screening: up to date HIV Screening: up to date Flu Vaccine: up to date Lung cancer Screening: N/A Obesity Screening: Done today Pneumonia Vaccines (2): N/A STI Screening: N/A PSA screening: done today  Discussed aspirin prophylaxis for myocardial infarction prevention and decision was made to continue ASA  LABORATORY TESTING:  Health maintenance labs ordered today as discussed above.   The natural history of prostate cancer and ongoing controversy regarding screening and potential treatment outcomes of prostate cancer has been discussed with the patient. The meaning of a false positive PSA and a false negative PSA has been discussed. He indicates understanding of the limitations of this screening test and wishes to proceed with screening PSA testing.   IMMUNIZATIONS:   - Tdap: Tetanus vaccination status reviewed: last tetanus booster within 10 years. - Influenza: Up to date - Pneumovax: Not applicable  SCREENING: - Colonoscopy: Up to date  Discussed with patient purpose of the colonoscopy is to detect colon cancer at curable precancerous or early stages   PATIENT COUNSELING:    Sexuality: Discussed sexually transmitted diseases, partner selection, use of condoms, avoidance of unintended pregnancy  and contraceptive alternatives.   Advised to avoid cigarette smoking.  I discussed with the patient that most people either abstain from alcohol or drink within safe limits (<=14/week and <=4 drinks/occasion for males, <=7/weeks and <= 3 drinks/occasion for females) and that the risk for alcohol disorders and other health effects rises proportionally with the number of drinks per week and how often a drinker exceeds daily limits.  Discussed cessation/primary prevention of  drug use and availability of treatment for abuse.   Diet: Encouraged to adjust  caloric intake to maintain  or achieve ideal body weight, to reduce intake of dietary saturated fat and total fat, to limit sodium intake by avoiding high sodium foods and not adding table salt, and to maintain adequate dietary potassium and calcium preferably from fresh fruits, vegetables, and low-fat dairy products.    stressed the importance of regular exercise  Injury prevention: Discussed safety belts, safety helmets, smoke detector, smoking near bedding or upholstery.   Dental health: Discussed importance of regular tooth brushing, flossing, and dental visits.   Follow up plan: NEXT PREVENTATIVE PHYSICAL DUE IN 1 YEAR. Return in about 4 weeks (around 03/22/2017) for Blood pressure check.

## 2017-02-22 NOTE — Assessment & Plan Note (Signed)
Rechecking levels today. Will treat as needed.  

## 2017-02-22 NOTE — Telephone Encounter (Signed)
Care team updated, 6CIT done and patient does not have advance directives and does not want to discuss it.

## 2017-02-22 NOTE — Assessment & Plan Note (Signed)
Not under good control. Will start lisinopril. Recheck 1 month. Call with any concerns.

## 2017-02-22 NOTE — Assessment & Plan Note (Signed)
Rechecking levels today. Await results.  

## 2017-02-22 NOTE — Telephone Encounter (Signed)
Patient called back but Jonelle Sidle was not available.   He can be reached at [phone]  Thank you

## 2017-02-23 LAB — CBC WITH DIFFERENTIAL/PLATELET
BASOS: 1 %
Basophils Absolute: 0 10*3/uL (ref 0.0–0.2)
EOS (ABSOLUTE): 0.2 10*3/uL (ref 0.0–0.4)
Eos: 3 %
HEMATOCRIT: 47.3 % (ref 37.5–51.0)
Hemoglobin: 16 g/dL (ref 13.0–17.7)
IMMATURE GRANS (ABS): 0 10*3/uL (ref 0.0–0.1)
IMMATURE GRANULOCYTES: 0 %
LYMPHS: 42 %
Lymphocytes Absolute: 2.4 10*3/uL (ref 0.7–3.1)
MCH: 30.8 pg (ref 26.6–33.0)
MCHC: 33.8 g/dL (ref 31.5–35.7)
MCV: 91 fL (ref 79–97)
Monocytes Absolute: 0.5 10*3/uL (ref 0.1–0.9)
Monocytes: 9 %
Neutrophils Absolute: 2.6 10*3/uL (ref 1.4–7.0)
Neutrophils: 45 %
PLATELETS: 206 10*3/uL (ref 150–379)
RBC: 5.19 x10E6/uL (ref 4.14–5.80)
RDW: 14.1 % (ref 12.3–15.4)
WBC: 5.7 10*3/uL (ref 3.4–10.8)

## 2017-02-23 LAB — LIPID PANEL W/O CHOL/HDL RATIO
Cholesterol, Total: 167 mg/dL (ref 100–199)
HDL: 44 mg/dL (ref >39)
LDL Calculated: 97 mg/dL (ref 0–99)
Triglycerides: 129 mg/dL (ref 0–149)
VLDL Cholesterol Cal: 26 mg/dL (ref 5–40)

## 2017-02-23 LAB — COMPREHENSIVE METABOLIC PANEL
A/G RATIO: 2.2 (ref 1.2–2.2)
ALBUMIN: 4.7 g/dL (ref 3.5–5.5)
ALT: 45 IU/L — AB (ref 0–44)
AST: 31 IU/L (ref 0–40)
Alkaline Phosphatase: 66 IU/L (ref 39–117)
BILIRUBIN TOTAL: 0.3 mg/dL (ref 0.0–1.2)
BUN / CREAT RATIO: 23 — AB (ref 9–20)
BUN: 21 mg/dL (ref 6–24)
CALCIUM: 10 mg/dL (ref 8.7–10.2)
CHLORIDE: 106 mmol/L (ref 96–106)
CO2: 19 mmol/L — ABNORMAL LOW (ref 20–29)
Creatinine, Ser: 0.9 mg/dL (ref 0.76–1.27)
GFR, EST AFRICAN AMERICAN: 108 mL/min/{1.73_m2} (ref >59)
GFR, EST NON AFRICAN AMERICAN: 93 mL/min/{1.73_m2} (ref >59)
GLOBULIN, TOTAL: 2.1 g/dL (ref 1.5–4.5)
Glucose: 131 mg/dL — ABNORMAL HIGH (ref 65–99)
POTASSIUM: 4.4 mmol/L (ref 3.5–5.2)
SODIUM: 143 mmol/L (ref 134–144)
TOTAL PROTEIN: 6.8 g/dL (ref 6.0–8.5)

## 2017-02-23 LAB — VITAMIN D 25 HYDROXY (VIT D DEFICIENCY, FRACTURES): VIT D 25 HYDROXY: 41.8 ng/mL (ref 30.0–100.0)

## 2017-02-23 LAB — PSA: PROSTATE SPECIFIC AG, SERUM: 0.2 ng/mL (ref 0.0–4.0)

## 2017-02-23 LAB — TSH: TSH: 2.85 u[IU]/mL (ref 0.450–4.500)

## 2017-02-25 ENCOUNTER — Other Ambulatory Visit: Payer: Self-pay | Admitting: Family Medicine

## 2017-02-25 ENCOUNTER — Telehealth: Payer: Self-pay | Admitting: Family Medicine

## 2017-02-25 MED ORDER — NYSTATIN 100000 UNIT/GM EX CREA: TOPICAL_CREAM | CUTANEOUS | 6 refills | Status: DC

## 2017-02-25 NOTE — Telephone Encounter (Signed)
Patient needs refill on his nystatin cream sent to [facility] [place]  Thanks

## 2017-03-09 ENCOUNTER — Telehealth: Payer: Self-pay | Admitting: Family Medicine

## 2017-03-09 NOTE — Telephone Encounter (Signed)
Patient's wife stopped by to drop off paperwork for [facility]. Patient's wife asked if provider could fill out and fax information to number provider. Patient's wife also asked if provider could also state that patient's weight is 271 pounds as well.  Paperwork placed in provider mailbox.   Please Advise.  Thank you

## 2017-03-10 ENCOUNTER — Encounter: Payer: Self-pay | Admitting: Family Medicine

## 2017-03-10 NOTE — Telephone Encounter (Signed)
Form given to Dr Johnson

## 2017-03-10 NOTE — Telephone Encounter (Signed)
Form filled out and letter written.

## 2017-03-22 ENCOUNTER — Other Ambulatory Visit: Payer: Self-pay | Admitting: Family Medicine

## 2017-03-22 ENCOUNTER — Telehealth: Payer: Self-pay | Admitting: Family Medicine

## 2017-03-22 NOTE — Telephone Encounter (Signed)
Done

## 2017-03-22 NOTE — Telephone Encounter (Signed)
Pt would like to have refill for zantac sent to [place].

## 2017-03-24 ENCOUNTER — Encounter: Payer: Self-pay | Admitting: Family Medicine

## 2017-03-24 ENCOUNTER — Ambulatory Visit (INDEPENDENT_AMBULATORY_CARE_PROVIDER_SITE_OTHER): Payer: Medicare HMO | Admitting: Family Medicine

## 2017-03-24 VITALS — BP 125/78 | HR 70 | Wt 271.5 lb

## 2017-03-24 DIAGNOSIS — R062 Wheezing: Secondary | ICD-10-CM | POA: Diagnosis not present

## 2017-03-24 DIAGNOSIS — I10 Essential (primary) hypertension: Secondary | ICD-10-CM | POA: Diagnosis not present

## 2017-03-24 MED ORDER — ALBUTEROL SULFATE HFA 108 (90 BASE) MCG/ACT IN AERS: 2.0000 | INHALATION_SPRAY | Freq: Four times a day (QID) | RESPIRATORY_TRACT | 0 refills | Status: DC | PRN

## 2017-03-24 MED ORDER — ALBUTEROL SULFATE (2.5 MG/3ML) 0.083% IN NEBU
2.5000 mg | INHALATION_SOLUTION | Freq: Once | RESPIRATORY_TRACT | Status: AC
  Administered 2017-03-24: 2.5 mg via RESPIRATORY_TRACT

## 2017-03-24 NOTE — Assessment & Plan Note (Signed)
Bariatric is too much- would like to see [facility]. Will get him referred over there.

## 2017-03-24 NOTE — Progress Notes (Signed)
BP 125/78 (BP Location: Left Arm, Patient Position: Sitting, Cuff Size: Large)   Pulse 70   Wt 271 lb 8 oz (123.2 kg)   SpO2 95%   BMI 42.27 kg/m    Subjective:    Patient ID: Derrick Sandoval., male    DOB: 1956/06/27, 61 y.o.   MRN: [id number]  HPI: Derrick Sandoval. is a 61 y.o. male  Chief Complaint  Patient presents with  . Hypertension   HYPERTENSION Hypertension status: controlled  Satisfied with current treatment? yes Duration of hypertension: months BP monitoring frequency:  not checking BP medication side effects:  no Medication compliance: excellent compliance Previous BP meds: lisinopril Aspirin: yes Recurrent headaches: no Visual changes: no Palpitations: no Dyspnea: no Chest pain: no Lower extremity edema: no Dizzy/lightheaded: no  Relevant past medical, surgical, family and social history reviewed and updated as indicated. Interim medical history since our last visit reviewed. Allergies and medications reviewed and updated.  Review of Systems  Constitutional: Positive for fatigue. Negative for activity change, appetite change, chills, diaphoresis, fever and unexpected weight change.  Respiratory: Negative.   Cardiovascular: Negative.   Neurological: Positive for dizziness and light-headedness. Negative for tremors, seizures, syncope, facial asymmetry, speech difficulty, weakness, numbness and headaches.  Psychiatric/Behavioral: Negative.     Per HPI unless specifically indicated above     Objective:    BP 125/78 (BP Location: Left Arm, Patient Position: Sitting, Cuff Size: Large)   Pulse 70   Wt 271 lb 8 oz (123.2 kg)   SpO2 95%   BMI 42.27 kg/m   Wt Readings from Last 3 Encounters:  03/24/17 271 lb 8 oz (123.2 kg)  02/22/17 271 lb 4 oz (123 kg)  02/08/17 271 lb (122.9 kg)    Physical Exam  Constitutional: He is oriented to person, place, and time. He appears well-developed and well-nourished. No distress.  HENT:  Head:  Normocephalic and atraumatic.  Right Ear: Hearing normal.  Left Ear: Hearing normal.  Nose: Nose normal.  Eyes: Conjunctivae and lids are normal. Right eye exhibits no discharge. Left eye exhibits no discharge. No scleral icterus.  Cardiovascular: Normal rate, regular rhythm, normal heart sounds and intact distal pulses.  Exam reveals no gallop and no friction rub.   No murmur heard. Pulmonary/Chest: Effort normal. No respiratory distress. He has wheezes. He has no rales. He exhibits no tenderness.  Musculoskeletal: Normal range of motion.  Neurological: He is alert and oriented to person, place, and time.  Skin: Skin is warm, dry and intact. No rash noted. He is not diaphoretic. No erythema. No pallor.  Psychiatric: He has a normal mood and affect. His speech is normal and behavior is normal. Judgment and thought content normal. Cognition and memory are normal.  Nursing note and vitals reviewed.   Results for orders placed or performed in visit on 02/22/17  CBC with Differential/Platelet  Result Value Ref Range   WBC 5.7 3.4 - 10.8 x10E3/uL   RBC 5.19 4.14 - 5.80 x10E6/uL   Hemoglobin 16.0 13.0 - 17.7 g/dL   Hematocrit 47.3 37.5 - 51.0 %   MCV 91 79 - 97 fL   MCH 30.8 26.6 - 33.0 pg   MCHC 33.8 31.5 - 35.7 g/dL   RDW 14.1 12.3 - 15.4 %   Platelets 206 150 - 379 x10E3/uL   Neutrophils 45 Not Estab. %   Lymphs 42 Not Estab. %   Monocytes 9 Not Estab. %   Eos 3 Not Estab. %  Basos 1 Not Estab. %   Neutrophils Absolute 2.6 1.4 - 7.0 x10E3/uL   Lymphocytes Absolute 2.4 0.7 - 3.1 x10E3/uL   Monocytes Absolute 0.5 0.1 - 0.9 x10E3/uL   EOS (ABSOLUTE) 0.2 0.0 - 0.4 x10E3/uL   Basophils Absolute 0.0 0.0 - 0.2 x10E3/uL   Immature Granulocytes 0 Not Estab. %   Immature Grans (Abs) 0.0 0.0 - 0.1 x10E3/uL  Comprehensive metabolic panel  Result Value Ref Range   Glucose 131 (H) 65 - 99 mg/dL   BUN 21 6 - 24 mg/dL   Creatinine, Ser 0.90 0.76 - 1.27 mg/dL   GFR calc non Af Amer 93 >59  mL/min/1.73   GFR calc Af Amer 108 >59 mL/min/1.73   BUN/Creatinine Ratio 23 (H) 9 - 20   Sodium 143 134 - 144 mmol/L   Potassium 4.4 3.5 - 5.2 mmol/L   Chloride 106 96 - 106 mmol/L   CO2 19 (L) 20 - 29 mmol/L   Calcium 10.0 8.7 - 10.2 mg/dL   Total Protein 6.8 6.0 - 8.5 g/dL   Albumin 4.7 3.5 - 5.5 g/dL   Globulin, Total 2.1 1.5 - 4.5 g/dL   Albumin/Globulin Ratio 2.2 1.2 - 2.2   Bilirubin Total 0.3 0.0 - 1.2 mg/dL   Alkaline Phosphatase 66 39 - 117 IU/L   AST 31 0 - 40 IU/L   ALT 45 (H) 0 - 44 IU/L  Lipid Panel w/o Chol/HDL Ratio  Result Value Ref Range   Cholesterol, Total 167 100 - 199 mg/dL   Triglycerides 129 0 - 149 mg/dL   HDL 44 >39 mg/dL   VLDL Cholesterol Cal 26 5 - 40 mg/dL   LDL Calculated 97 0 - 99 mg/dL  Microalbumin, Urine Waived  Result Value Ref Range   Microalb, Ur Waived 10 0 - 19 mg/L   Creatinine, Urine Waived 50 10 - 300 mg/dL   Microalb/Creat Ratio 30-300 (H) <30 mg/g  PSA  Result Value Ref Range   Prostate Specific Ag, Serum 0.2 0.0 - 4.0 ng/mL  TSH  Result Value Ref Range   TSH 2.850 0.450 - 4.500 uIU/mL  UA/M w/rflx Culture, Routine  Result Value Ref Range   Specific Gravity, UA 1.015 1.005 - 1.030   pH, UA 6.0 5.0 - 7.5   Color, UA Yellow Yellow   Appearance Ur Clear Clear   Leukocytes, UA Negative Negative   Protein, UA Negative Negative/Trace   Glucose, UA Negative Negative   Ketones, UA Negative Negative   RBC, UA Negative Negative   Bilirubin, UA Negative Negative   Urobilinogen, Ur 0.2 0.2 - 1.0 mg/dL   Nitrite, UA Negative Negative  VITAMIN D 25 Hydroxy (Vit-D Deficiency, Fractures)  Result Value Ref Range   Vit D, 25-Hydroxy 41.8 30.0 - 100.0 ng/mL      Assessment & Plan:   Problem List Items Addressed This Visit      Cardiovascular and Mediastinum   Benign hypertension - Primary    Under good control. Continue current regimen. Continue to monitor. Call with any concerns.       Relevant Orders   Basic metabolic panel      Other   Morbid obesity [facility])    Bariatric is too much- would like to see [facility]. Will get him referred over there.        Other Visit Diagnoses    Wheezing       Better following neb. Rx for inhaler given today. Let us know if not  getting better or getting worse.    Relevant Medications   albuterol (PROVENTIL) (2.5 MG/3ML) 0.083% nebulizer solution 2.5 mg (Start on 03/24/2017  4:00 PM)       Follow up plan: No Follow-up on file.

## 2017-03-24 NOTE — Addendum Note (Signed)
Addended by: Valerie Roys on: 03/24/2017 04:11 PM   Modules accepted: Orders

## 2017-03-24 NOTE — Assessment & Plan Note (Signed)
Under good control. Continue current regimen. Continue to monitor. Call with any concerns. 

## 2017-03-25 LAB — BASIC METABOLIC PANEL
BUN/Creatinine Ratio: 22 — ABNORMAL HIGH (ref 9–20)
BUN: 22 mg/dL (ref 6–24)
CALCIUM: 9.5 mg/dL (ref 8.7–10.2)
CO2: 19 mmol/L — AB (ref 20–29)
CREATININE: 0.98 mg/dL (ref 0.76–1.27)
Chloride: 108 mmol/L — ABNORMAL HIGH (ref 96–106)
GFR, EST AFRICAN AMERICAN: 97 mL/min/{1.73_m2} (ref >59)
GFR, EST NON AFRICAN AMERICAN: 84 mL/min/{1.73_m2} (ref >59)
Glucose: 129 mg/dL — ABNORMAL HIGH (ref 65–99)
Potassium: 4.1 mmol/L (ref 3.5–5.2)
Sodium: 145 mmol/L — ABNORMAL HIGH (ref 134–144)

## 2017-03-28 ENCOUNTER — Telehealth: Payer: Self-pay | Admitting: Family Medicine

## 2017-03-28 NOTE — Telephone Encounter (Signed)
Patient would like for someone to contact [employer] to do a tier- down request for his albuterol in order for him to be able to afford his inhaler.  If any questions call [phone] Jaskirat  Thank you

## 2017-03-28 NOTE — Telephone Encounter (Signed)
Routing to [employer]

## 2017-03-29 NOTE — Telephone Encounter (Signed)
Albuterol is a covered medication. It can be filled as generic, does not have to be Proair or Ventolin. Or it could be either or. Which ever one is cheaper.   I can't do a PA/tier down request for a covered medication. It will bounce back as N/A because it's a formulary medication.

## 2017-03-29 NOTE — Telephone Encounter (Signed)
I do not write for any name of albuterol. I write for albuterol. They can fill whatever is covered.

## 2017-03-29 NOTE — Telephone Encounter (Signed)
Routing to provider  

## 2017-03-30 NOTE — Telephone Encounter (Signed)
That is his copay. Unfortunately, there's nothing I can do about that.

## 2017-03-30 NOTE — Telephone Encounter (Signed)
Spoke with pharmacy.  The covered drug is Ventolin but it's $54 and patient does not want to pay for it.  I still cannot do a PA for a covered medication.   Tried calling patient, no answer. LVM for patient to return my call.

## 2017-03-30 NOTE — Telephone Encounter (Signed)
Patient notified

## 2017-04-21 ENCOUNTER — Telehealth: Payer: Self-pay | Admitting: Family Medicine

## 2017-04-21 NOTE — Telephone Encounter (Signed)
Patient called to see if Dr Wynetta Emery would call him in an antibiotic or something for upper respiratory issues.  Phar [facility] [place] [phone]  Thank You

## 2017-04-21 NOTE — Telephone Encounter (Signed)
Needs to be seen

## 2017-04-21 NOTE — Telephone Encounter (Signed)
Please get patient scheduled.  °

## 2017-04-21 NOTE — Telephone Encounter (Signed)
Routing to provider  

## 2017-04-22 NOTE — Telephone Encounter (Signed)
Called patient yesterday and today no answer left two vm to call us back and schedule an acute appt.

## 2017-05-09 ENCOUNTER — Telehealth: Payer: Self-pay

## 2017-05-09 MED ORDER — LISINOPRIL 10 MG PO TABS: 10.0000 mg | ORAL_TABLET | Freq: Every day | ORAL | 1 refills | Status: DC

## 2017-05-09 NOTE — Telephone Encounter (Signed)
Pharmacy is requesting a 90 day supply of Lisinopril to comply with insurance.

## 2017-05-09 NOTE — Telephone Encounter (Signed)
Rx sent to his pharmacy

## 2017-05-16 DIAGNOSIS — M1990 Unspecified osteoarthritis, unspecified site: Secondary | ICD-10-CM | POA: Diagnosis not present

## 2017-05-16 DIAGNOSIS — K219 Gastro-esophageal reflux disease without esophagitis: Secondary | ICD-10-CM | POA: Diagnosis not present

## 2017-05-16 DIAGNOSIS — Z7951 Long term (current) use of inhaled steroids: Secondary | ICD-10-CM | POA: Diagnosis not present

## 2017-05-16 DIAGNOSIS — Z7982 Long term (current) use of aspirin: Secondary | ICD-10-CM | POA: Diagnosis not present

## 2017-05-16 DIAGNOSIS — Z6841 Body Mass Index (BMI) 40.0 and over, adult: Secondary | ICD-10-CM | POA: Diagnosis not present

## 2017-05-16 DIAGNOSIS — I10 Essential (primary) hypertension: Secondary | ICD-10-CM | POA: Diagnosis not present

## 2017-05-16 DIAGNOSIS — Z7189 Other specified counseling: Secondary | ICD-10-CM | POA: Diagnosis not present

## 2017-05-17 ENCOUNTER — Encounter: Payer: Self-pay | Admitting: Family Medicine

## 2017-05-17 ENCOUNTER — Other Ambulatory Visit: Payer: Self-pay | Admitting: Family Medicine

## 2017-05-17 ENCOUNTER — Ambulatory Visit (INDEPENDENT_AMBULATORY_CARE_PROVIDER_SITE_OTHER): Payer: Medicare HMO | Admitting: Family Medicine

## 2017-05-17 DIAGNOSIS — J209 Acute bronchitis, unspecified: Secondary | ICD-10-CM | POA: Diagnosis not present

## 2017-05-17 DIAGNOSIS — I10 Essential (primary) hypertension: Secondary | ICD-10-CM | POA: Diagnosis not present

## 2017-05-17 MED ORDER — PREDNISONE 10 MG PO TABS: ORAL_TABLET | ORAL | 0 refills | Status: DC

## 2017-05-17 MED ORDER — AZITHROMYCIN 250 MG PO TABS: ORAL_TABLET | ORAL | 0 refills | Status: DC

## 2017-05-17 MED ORDER — LISINOPRIL 20 MG PO TABS: 20.0000 mg | ORAL_TABLET | Freq: Every day | ORAL | 1 refills | Status: DC

## 2017-05-17 NOTE — Progress Notes (Signed)
BP (!) 169/109 (BP Location: Left Arm, Patient Position: Sitting, Cuff Size: Large)   Pulse 62   Temp 97.7 F (36.5 C)   Ht 5' 7.2" (1.707 m)   Wt 280 lb (127 kg)   SpO2 97%   BMI 43.59 kg/m    Subjective:    Patient ID: Derrick Ball., male    DOB: 15-Oct-1955, 61 y.o.   MRN: [id number]  HPI: Derrick Salguero. is a 60 y.o. male  Chief Complaint  Patient presents with  . Obesity  . URI   Following with the bariatric clinic and they need him to have 6 months of formal weight loss visits here. This is her first visit.  Has been walking 2-3 miles a day in an hour and 15 minutes, S, M, T, F and light weights Eating 2 boiled eggs, dry toast and grapefuit for breakfast every other day Jimmy deans for breakfast on the other days  16oz+ of 2% milk a day, no more soda or sweet tea, otherwise water Lunch: bowl of fruit, 3 pean Supper: Meat and either veggies or salad Snack- veggies, with light ranch  HYPERTENSION Hypertension status: uncontrolled  Satisfied with current treatment? yes Duration of hypertension: chronic BP monitoring frequency:  not checking BP range:  BP medication side effects:  no Medication compliance: excellent compliance Previous BP meds: lisinopril Aspirin: yes Recurrent headaches: no Visual changes: no Palpitations: no Dyspnea: no Chest pain: no Lower extremity edema: no Dizzy/lightheaded: no   UPPER RESPIRATORY TRACT INFECTION Duration: 2 weeks Worst symptom: tightness in his chest Fever: no Cough: yes Shortness of breath: yes Wheezing: yes Chest pain: yes Chest tightness: yes Chest congestion: yes Nasal congestion: yes Runny nose: no Post nasal drip: yes Sneezing: yes Sore throat: yes Swollen glands: no Sinus pressure: no Headache: yes Face pain: no Toothache: yes Ear pain: yes "right Ear pressure: yes "right Eyes red/itching:no Eye drainage/crusting: no  Vomiting: yes Rash: no Fatigue: yes Sick contacts: no Strep  contacts: no  Context: worse Recurrent sinusitis: no Relief with OTC cold/cough medications: no  Treatments attempted: anti-histamine   Relevant past medical, surgical, family and social history reviewed and updated as indicated. Interim medical history since our last visit reviewed. Allergies and medications reviewed and updated.  Review of Systems  Constitutional: Negative.   HENT: Positive for congestion, postnasal drip, rhinorrhea, sinus pain, sinus pressure, sneezing and sore throat. Negative for dental problem, drooling, ear discharge, ear pain, facial swelling, hearing loss, mouth sores, nosebleeds, tinnitus, trouble swallowing and voice change.   Respiratory: Negative.   Cardiovascular: Negative.   Psychiatric/Behavioral: Negative.     Per HPI unless specifically indicated above     Objective:    BP (!) 169/109 (BP Location: Left Arm, Patient Position: Sitting, Cuff Size: Large)   Pulse 62   Temp 97.7 F (36.5 C)   Ht 5' 7.2" (1.707 m)   Wt 280 lb (127 kg)   SpO2 97%   BMI 43.59 kg/m   Wt Readings from Last 3 Encounters:  05/17/17 280 lb (127 kg)  03/24/17 271 lb 8 oz (123.2 kg)  02/22/17 271 lb 4 oz (123 kg)    Physical Exam  Constitutional: He is oriented to person, place, and time. He appears well-developed and well-nourished. No distress.  HENT:  Head: Normocephalic and atraumatic.  Right Ear: Hearing and external ear normal.  Left Ear: Hearing and external ear normal.  Nose: Nose normal.  Mouth/Throat: Oropharynx is clear and moist.  No oropharyngeal exudate.  Eyes: Pupils are equal, round, and reactive to light. Conjunctivae, EOM and lids are normal. Right eye exhibits no discharge. Left eye exhibits no discharge. No scleral icterus.  Neck: Normal range of motion. Neck supple. No JVD present. No tracheal deviation present. No thyromegaly present.  Cardiovascular: Normal rate, regular rhythm, normal heart sounds and intact distal pulses.  Exam reveals no  gallop and no friction rub.   No murmur heard. Pulmonary/Chest: Effort normal. No stridor. No respiratory distress. He has wheezes. He has no rales. He exhibits no tenderness.  Musculoskeletal: Normal range of motion.  Lymphadenopathy:    He has no cervical adenopathy.  Neurological: He is alert and oriented to person, place, and time.  Skin: Skin is warm, dry and intact. No rash noted. He is not diaphoretic. No erythema. No pallor.  Psychiatric: He has a normal mood and affect. His speech is normal and behavior is normal. Judgment and thought content normal. Cognition and memory are normal.    Results for orders placed or performed in visit on 15/17/61  Basic metabolic panel  Result Value Ref Range   Glucose 129 (H) 65 - 99 mg/dL   BUN 22 6 - 24 mg/dL   Creatinine, Ser 0.98 0.76 - 1.27 mg/dL   GFR calc non Af Amer 84 >59 mL/min/1.73   GFR calc Af Amer 97 >59 mL/min/1.73   BUN/Creatinine Ratio 22 (H) 9 - 20   Sodium 145 (H) 134 - 144 mmol/L   Potassium 4.1 3.5 - 5.2 mmol/L   Chloride 108 (H) 96 - 106 mmol/L   CO2 19 (L) 20 - 29 mmol/L   Calcium 9.5 8.7 - 10.2 mg/dL      Assessment & Plan:   Problem List Items Addressed This Visit      Cardiovascular and Mediastinum   Benign hypertension    Not under good control. Will increase lisinopril to 20mg  daily and recheck at follow up.       Relevant Medications   lisinopril (PRINIVIL,ZESTRIL) 20 MG tablet     Other   Morbid obesity ([place]) - Primary    Following with bariatric clinic. Needs 6 months of visits showing ability to lose weight and formal changes. Will get him into nutrition for full discussion of dietary changes. Will work on increasing intensity of walking and will increase to 20 minute miles from the less than 20 minutes miles he has been doing. Will stop drinking 2% milk and switch to either 1% or skim. Continue to follow with the bariatric center. We will await recommendations from nutritionist.       Relevant  Orders   Amb ref to Medical Nutrition Therapy-MNT    Other Visit Diagnoses    Acute bronchitis, unspecified organism       Will treat with prednisone and azithromycin. Recheck lungs 2 weeks. Call with any concerns or if not improving.        Follow up plan: Return in about 2 weeks (around 05/31/2017) for Follow up lungs.

## 2017-05-17 NOTE — Patient Instructions (Signed)
Goal: start 1% milk, 20 minute mile in next month, see the nutritionist

## 2017-05-18 NOTE — Telephone Encounter (Signed)
Routing to provider. Appt 05/31/17.

## 2017-05-21 NOTE — Assessment & Plan Note (Signed)
Following with bariatric clinic. Needs 6 months of visits showing ability to lose weight and formal changes. Will get him into nutrition for full discussion of dietary changes. Will work on increasing intensity of walking and will increase to 20 minute miles from the less than 20 minutes miles he has been doing. Will stop drinking 2% milk and switch to either 1% or skim. Continue to follow with the bariatric center. We will await recommendations from nutritionist.

## 2017-05-21 NOTE — Assessment & Plan Note (Signed)
Not under good control. Will increase lisinopril to 20mg  daily and recheck at follow up.

## 2017-05-30 ENCOUNTER — Other Ambulatory Visit: Payer: Self-pay | Admitting: Family Medicine

## 2017-05-31 ENCOUNTER — Ambulatory Visit (INDEPENDENT_AMBULATORY_CARE_PROVIDER_SITE_OTHER): Payer: Medicare HMO | Admitting: Family Medicine

## 2017-05-31 ENCOUNTER — Encounter: Payer: Self-pay | Admitting: Family Medicine

## 2017-05-31 VITALS — BP 132/87 | HR 60 | Temp 97.5°F | Wt 273.0 lb

## 2017-05-31 DIAGNOSIS — Z23 Encounter for immunization: Secondary | ICD-10-CM

## 2017-05-31 DIAGNOSIS — J309 Allergic rhinitis, unspecified: Secondary | ICD-10-CM | POA: Diagnosis not present

## 2017-05-31 DIAGNOSIS — L723 Sebaceous cyst: Secondary | ICD-10-CM

## 2017-05-31 DIAGNOSIS — J209 Acute bronchitis, unspecified: Secondary | ICD-10-CM

## 2017-05-31 MED ORDER — MONTELUKAST SODIUM 10 MG PO TABS: 10.0000 mg | ORAL_TABLET | Freq: Every day | ORAL | 3 refills | Status: DC

## 2017-05-31 MED ORDER — FLUTICASONE PROPIONATE 50 MCG/ACT NA SUSP: 1.0000 | Freq: Two times a day (BID) | NASAL | 12 refills | Status: DC

## 2017-05-31 NOTE — Progress Notes (Signed)
BP 132/87 (BP Location: Left Arm, Patient Position: Sitting, Cuff Size: Large)   Pulse 60   Temp (!) 97.5 F (36.4 C)   Wt 273 lb (123.8 kg)   SpO2 97%   BMI 42.50 kg/m    Subjective:    Patient ID: Derrick Ball., male    DOB: 1955-10-12, 61 y.o.   MRN: [id number]  HPI: Derrick Heemstra. is a 61 y.o. male  Chief Complaint  Patient presents with  . Lung Recheck  . Medication Refill    Flomax and Flonase. Patient would like to have the Flonax increased to 2 sprays each nostril daily.    Still feeling congested, but his cough is better. No SOB, No wheezing.   LUMP Duration: years Location: L hip Onset: gradual Painful: no Discomfort: no Status:  not changing Trauma: no Redness: no Bruising: no Recent infection: no Swollen lymph nodes: no Requesting removal: no History of cancer: no Family history of cancer: no History of the same: no  Has been following the bariatric center's diet, has lost 7 lbs in the last 2 weeks, states that he is "starving".  Relevant past medical, surgical, family and social history reviewed and updated as indicated. Interim medical history since our last visit reviewed. Allergies and medications reviewed and updated.  Review of Systems  Constitutional: Negative.   HENT: Positive for congestion, postnasal drip and rhinorrhea. Negative for dental problem, drooling, ear discharge, ear pain, facial swelling, hearing loss, mouth sores, nosebleeds, sinus pain, sinus pressure, sneezing, sore throat, tinnitus, trouble swallowing and voice change.   Respiratory: Negative.   Cardiovascular: Negative.   Psychiatric/Behavioral: Negative.     Per HPI unless specifically indicated above     Objective:    BP 132/87 (BP Location: Left Arm, Patient Position: Sitting, Cuff Size: Large)   Pulse 60   Temp (!) 97.5 F (36.4 C)   Wt 273 lb (123.8 kg)   SpO2 97%   BMI 42.50 kg/m   Wt Readings from Last 3 Encounters:  05/31/17 273 lb (123.8  kg)  05/17/17 280 lb (127 kg)  03/24/17 271 lb 8 oz (123.2 kg)    Physical Exam  Constitutional: He is oriented to person, place, and time. He appears well-developed and well-nourished. No distress.  HENT:  Head: Normocephalic and atraumatic.  Right Ear: Hearing and external ear normal.  Left Ear: Hearing and external ear normal.  Nose: Nose normal.  Mouth/Throat: Oropharynx is clear and moist. No oropharyngeal exudate.  Eyes: Pupils are equal, round, and reactive to light. Conjunctivae, EOM and lids are normal. Right eye exhibits no discharge. Left eye exhibits no discharge. No scleral icterus.  Cardiovascular: Normal rate, regular rhythm, normal heart sounds and intact distal pulses.  Exam reveals no gallop and no friction rub.   No murmur heard. Pulmonary/Chest: Effort normal and breath sounds normal. No respiratory distress. He has no wheezes. He has no rales. He exhibits no tenderness.  Musculoskeletal: Normal range of motion.  Neurological: He is alert and oriented to person, place, and time.  Skin: Skin is warm, dry and intact. No rash noted. He is not diaphoretic. No erythema. No pallor.  Sebaceous cyst on R hip at spot his pants sit  Psychiatric: He has a normal mood and affect. His speech is normal and behavior is normal. Judgment and thought content normal. Cognition and memory are normal.  Nursing note and vitals reviewed.   Results for orders placed or performed in visit on 03/24/17  Basic metabolic panel  Result Value Ref Range   Glucose 129 (H) 65 - 99 mg/dL   BUN 22 6 - 24 mg/dL   Creatinine, Ser 0.98 0.76 - 1.27 mg/dL   GFR calc non Af Amer 84 >59 mL/min/1.73   GFR calc Af Amer 97 >59 mL/min/1.73   BUN/Creatinine Ratio 22 (H) 9 - 20   Sodium 145 (H) 134 - 144 mmol/L   Potassium 4.1 3.5 - 5.2 mmol/L   Chloride 108 (H) 96 - 106 mmol/L   CO2 19 (L) 20 - 29 mmol/L   Calcium 9.5 8.7 - 10.2 mg/dL      Assessment & Plan:   Problem List Items Addressed This Visit        Respiratory   Rhinitis, allergic    Still acting up. Continue current dose of flonase and claritin. Will add singulair. Call with any concerns or if not getting better.        Other Visit Diagnoses    Acute bronchitis, unspecified organism    -  Primary   Resolved. Lungs clear. Continue inhalers. Call with any concerns.    Immunization due       Flu shot given today.   Relevant Orders   Flu Vaccine QUAD 6+ mos PF IM (Fluarix Quad PF) (Completed)   Sebaceous cyst       Reassured patient. With location- concern about healing if removed. He will let us know if it's bothering him enough to remove.        Follow up plan: Return Beginning of November, for Follow up weight.

## 2017-05-31 NOTE — Patient Instructions (Addendum)
Influenza (Flu) Vaccine (Inactivated or Recombinant): What You Need to Know 1. Why get vaccinated? Influenza ("flu") is a contagious disease that spreads around the [place] every year, usually between October and May. Flu is caused by influenza viruses, and is spread mainly by coughing, sneezing, and close contact. Anyone can get flu. Flu strikes suddenly and can last several days. Symptoms vary by age, but can include:  fever/chills  sore throat  muscle aches  fatigue  cough  headache  runny or stuffy nose  Flu can also lead to pneumonia and blood infections, and cause diarrhea and seizures in children. If you have a medical condition, such as heart or lung disease, flu can make it worse. Flu is more dangerous for some people. Infants and young children, people 23 years of age and older, pregnant women, and people with certain health conditions or a weakened immune system are at greatest risk. Each year thousands of people in the [place] die from flu, and many more are hospitalized. Flu vaccine can:  keep you from getting flu,  make flu less severe if you do get it, and  keep you from spreading flu to your family and other people. 2. Inactivated and recombinant flu vaccines A dose of flu vaccine is recommended every flu season. Children 6 months through 91 years of age may need two doses during the same flu season. Everyone else needs only one dose each flu season. Some inactivated flu vaccines contain a very small amount of a mercury-based preservative called thimerosal. Studies have not shown thimerosal in vaccines to be harmful, but flu vaccines that do not contain thimerosal are available. There is no live flu virus in flu shots. They cannot cause the flu. There are many flu viruses, and they are always changing. Each year a new flu vaccine is made to protect against three or four viruses that are likely to cause disease in the upcoming flu season. But even when the  vaccine doesn't exactly match these viruses, it may still provide some protection. Flu vaccine cannot prevent:  flu that is caused by a virus not covered by the vaccine, or  illnesses that look like flu but are not.  It takes about 2 weeks for protection to develop after vaccination, and protection lasts through the flu season. 3. Some people should not get this vaccine Tell the person who is giving you the vaccine:  If you have any severe, life-threatening allergies. If you ever had a life-threatening allergic reaction after a dose of flu vaccine, or have a severe allergy to any part of this vaccine, you may be advised not to get vaccinated. Most, but not all, types of flu vaccine contain a small amount of egg protein.  If you ever had Guillain-Barr Syndrome (also called GBS). Some people with a history of GBS should not get this vaccine. This should be discussed with your doctor.  If you are not feeling well. It is usually okay to get flu vaccine when you have a mild illness, but you might be asked to come back when you feel better.  4. Risks of a vaccine reaction With any medicine, including vaccines, there is a chance of reactions. These are usually mild and go away on their own, but serious reactions are also possible. Most people who get a flu shot do not have any problems with it. Minor problems following a flu shot include:  soreness, redness, or swelling where the shot was given  hoarseness  sore,  red or itchy eyes  cough  fever  aches  headache  itching  fatigue  If these problems occur, they usually begin soon after the shot and last 1 or 2 days. More serious problems following a flu shot can include the following:  There may be a small increased risk of Guillain-Barre Syndrome (GBS) after inactivated flu vaccine. This risk has been estimated at 1 or 2 additional cases per million people vaccinated. This is much lower than the risk of severe complications from  flu, which can be prevented by flu vaccine.  Young children who get the flu shot along with pneumococcal vaccine (PCV13) and/or DTaP vaccine at the same time might be slightly more likely to have a seizure caused by fever. Ask your doctor for more information. Tell your doctor if a child who is getting flu vaccine has ever had a seizure.  Problems that could happen after any injected vaccine:  People sometimes faint after a medical procedure, including vaccination. Sitting or lying down for about 15 minutes can help prevent fainting, and injuries caused by a fall. Tell your doctor if you feel dizzy, or have vision changes or ringing in the ears.  Some people get severe pain in the shoulder and have difficulty moving the arm where a shot was given. This happens very rarely.  Any medication can cause a severe allergic reaction. Such reactions from a vaccine are very rare, estimated at about 1 in a million doses, and would happen within a few minutes to a few hours after the vaccination. As with any medicine, there is a very remote chance of a vaccine causing a serious injury or death. The safety of vaccines is always being monitored. For more information, visit: http://www.aguilar.org/ 5. What if there is a serious reaction? What should I look for? Look for anything that concerns you, such as signs of a severe allergic reaction, very high fever, or unusual behavior. Signs of a severe allergic reaction can include hives, swelling of the face and throat, difficulty breathing, a fast heartbeat, dizziness, and weakness. These would start a few minutes to a few hours after the vaccination. What should I do?  If you think it is a severe allergic reaction or other emergency that can't wait, call 9-1-1 and get the person to the nearest hospital. Otherwise, call your doctor.  Reactions should be reported to the Vaccine Adverse Event Reporting System (VAERS). Your doctor should file this report, or you  can do it yourself through the VAERS web site at www.vaers.SamedayNews.es, or by calling 6094730752. ? VAERS does not give medical advice. 6. The National Vaccine Injury Compensation Program The [employer] Vaccine Injury Compensation Program (VICP) is a federal program that was created to compensate people who may have been injured by certain vaccines. Persons who believe they may have been injured by a vaccine can learn about the program and about filing a claim by calling 458-267-6070 or visiting the [place] website at GoldCloset.com.ee. There is a time limit to file a claim for compensation. 7. How can I learn more?  Ask your healthcare provider. He or she can give you the vaccine package insert or suggest other sources of information.  Call your local or state health department.  Contact the Centers for Disease Control and Prevention (CDC): ? Call (540)164-9661 (1-800-CDC-INFO) or ? Visit CDC's website at https://gibson.com/ Vaccine Information Statement, Inactivated Influenza Vaccine (03/22/2014) This information is not intended to replace advice given to you by your health care provider. Make sure  you discuss any questions you have with your health care provider. Document Released: 05/27/2006 Document Revised: 04/22/2016 Document Reviewed: 04/22/2016 Elsevier Interactive Patient Education  2017 [employer].  Epidermal Cyst An epidermal cyst is sometimes called an epidermal inclusion cyst or an infundibular cyst. It is a sac made of skin tissue. The sac contains a substance called keratin. Keratin is a protein that is normally secreted through the hair follicles. When keratin becomes trapped in the top layer of skin (epidermis), it can form an epidermal cyst. Epidermal cysts are usually found on the face, neck, trunk, and genitals. These cysts are usually harmless (benign), and they may not cause symptoms unless they become infected. It is important not to pop epidermal cysts  yourself. What are the causes? This condition may be caused by:  A blocked hair follicle.  A hair that curls and re-enters the skin instead of growing straight out of the skin (ingrown hair).  A blocked pore.  Irritated skin.  An injury to the skin.  Certain conditions that are passed along from parent to child (inherited).  Human papillomavirus (HPV).  What increases the risk? The following factors may make you more likely to develop an epidermal cyst:  Having acne.  Being overweight.  Wearing tight clothing.  What are the signs or symptoms? The only symptom of this condition may be a small, painless lump underneath the skin. When an epidermal cyst becomes infected, symptoms may include:  Redness.  Inflammation.  Tenderness.  Warmth.  Fever.  Keratin draining from the cyst. Keratin may look like a grayish-white, bad-smelling substance.  Pus draining from the cyst.  How is this diagnosed? This condition is diagnosed with a physical exam. In some cases, you may have a sample of tissue (biopsy) taken from your cyst to be examined under a microscope or tested for bacteria. You may be referred to a health care provider who specializes in skin care (dermatologist). How is this treated? In many cases, epidermal cysts go away on their own without treatment. If a cyst becomes infected, treatment may include:  Opening and draining the cyst. After draining, minor surgery to remove the rest of the cyst may be done.  Antibiotic medicine to help prevent infection.  Injections of medicines (steroids) that help to reduce inflammation.  Surgery to remove the cyst. Surgery may be done if: ? The cyst becomes large. ? The cyst bothers you. ? There is a chance that the cyst could turn into cancer.  Follow these instructions at home:  Take over-the-counter and prescription medicines only as told by your health care provider.  If you were prescribed an antibiotic, use it as  told by your health care provider. Do not stop using the antibiotic even if you start to feel better.  Keep the area around your cyst clean and dry.  Wear loose, dry clothing.  Do not try to pop your cyst.  Avoid touching your cyst.  Check your cyst every day for signs of infection.  Keep all follow-up visits as told by your health care provider. This is important. How is this prevented?  Wear clean, dry, clothing.  Avoid wearing tight clothing.  Keep your skin clean and dry. Shower or take baths every day.  Wash your body with a benzoyl peroxide wash when you shower or bathe. Contact a health care provider if:  Your cyst develops symptoms of infection.  Your condition is not improving or is getting worse.  You develop a cyst that looks  different from other cysts you have had.  You have a fever. Get help right away if:  Redness spreads from the cyst into the surrounding area. This information is not intended to replace advice given to you by your health care provider. Make sure you discuss any questions you have with your health care provider. Document Released: 07/03/2004 Document Revised: 03/31/2016 Document Reviewed: 06/04/2015 Elsevier Interactive Patient Education  [employer].

## 2017-05-31 NOTE — Assessment & Plan Note (Signed)
Still acting up. Continue current dose of flonase and claritin. Will add singulair. Call with any concerns or if not getting better.

## 2017-06-06 ENCOUNTER — Encounter: Payer: Medicare HMO | Attending: Family Medicine | Admitting: Dietician

## 2017-06-06 VITALS — Ht 68.0 in | Wt 272.9 lb

## 2017-06-06 DIAGNOSIS — I10 Essential (primary) hypertension: Secondary | ICD-10-CM | POA: Diagnosis not present

## 2017-06-06 DIAGNOSIS — K219 Gastro-esophageal reflux disease without esophagitis: Secondary | ICD-10-CM | POA: Diagnosis not present

## 2017-06-06 DIAGNOSIS — Z96653 Presence of artificial knee joint, bilateral: Secondary | ICD-10-CM | POA: Diagnosis not present

## 2017-06-06 DIAGNOSIS — Z713 Dietary counseling and surveillance: Secondary | ICD-10-CM | POA: Insufficient documentation

## 2017-06-06 DIAGNOSIS — Z6841 Body Mass Index (BMI) 40.0 and over, adult: Secondary | ICD-10-CM

## 2017-06-06 DIAGNOSIS — E669 Obesity, unspecified: Secondary | ICD-10-CM | POA: Insufficient documentation

## 2017-06-06 NOTE — Patient Instructions (Signed)
   Include a protein food, whole grain starch, and a vegetable with each meal.   OK to have fruit if you keep to a small portion of starch.   Eat large portions of "free" vegetables.   Keep up your exercise and good food choices, you are doing a great job!  Call Pam with any questions or concerns.

## 2017-06-06 NOTE — Progress Notes (Signed)
Medical Nutrition Therapy: Visit start time: 0930  end time: 1030  Assessment:  Diagnosis: obesity Past medical history: HTN, GERD, bilateral knee replacements due to osteoarthritis Psychosocial issues/ stress concerns: patient reports some short-term memory loss Preferred learning method:  . Auditory . Hands-on  Current weight: 272.9lbs  Height: 5'8" Medications, supplements: reconciled list in medical record  Progress and evaluation: Patient reports trying low carb diet in the past with limited success. Has gained significant weight, about 30lbs in past 2 years, increase of 6 inches in waist; he feels possibly due to decreased activity with his knee issues. He has now started on high protein diet, including eggs and grapefruit (from internet info). He reports loss of about 7lbs since previous MD visit. Patient is planning to have bariatric surgery after completing supervised weight loss for 6 months. He will have surgery at [facility].   Physical activity: gym 90 minutes, 4-5 days per week; or walking. Push mows lawn 45 minutes once a week.   Dietary Intake:  Usual eating pattern includes 3 meals and 2-3 snacks per day. Dining out frequency: 2-3 meals per week.  Breakfast: 9-10am 2 boiled eggs and canned grapefruit, 1/2 medium bagel, 1c coffee; protein shake today whey protein 24g, 140kcal Snack: banana or peanut butter and crackers Lunch: fruit-- pineapple, melon Snack: 16oz coke--diet caffeine free Supper: chicken fish or steak 3oz with salad or other vegetable Snack: 2 crackers with peanut butter or clementine orange Beverages: 6 bottles water daily sometimes with flavoring; 1 diet soda daily, 1c. Coffee daily, some 1% milk  Nutrition Care Education: Topics covered: weight management Basic nutrition: basic food groups, appropriate nutrient balance, appropriate meal and snack schedule, general nutrition guidelines for about 1500-1700kcal daily intake.  Weight control: benefits of  weight control; weight loss surgery and changes involved; importance of low fat, low sugar choices, and portion control; meal options for balanced nutrition; difference in food choices before and after weight loss surgery. Advanced nutrition:  cooking techniques, food label reading Other lifestyle changes:  role of regular exercise  Nutritional Diagnosis:  [place]-3.3 Overweight/obesity As related to history of excess calories and inactivity.  As evidenced by BMI 41.5, patient and wife's reports.  Intervention: Instruction as noted above.   At this time patient will follow up with PCP for supervised weight loss prior to bariatric surgery.    No RD follow-up at this time; patient will schedule later if needed.   Education Materials given:  . Plate Planner with food lists . Sample menus . "Am I Ready?" list of considerations for weight loss surgery (AND) . Goals/ instructions  Learner/ who was taught:  . Patient  . Spouse/ partner  Level of understanding: Marland Kitchen Verbalizes/ demonstrates competency  Demonstrated degree of understanding via:   Teach back Learning barriers:  Patient reports some memory issues; provided detailed written materials.  Willingness to learn/ readiness for change: . Eager, change in progress  Monitoring and Evaluation:  Dietary intake, exercise, and body weight      follow up: prn

## 2017-06-21 ENCOUNTER — Ambulatory Visit (INDEPENDENT_AMBULATORY_CARE_PROVIDER_SITE_OTHER): Payer: Medicare HMO | Admitting: Family Medicine

## 2017-06-21 ENCOUNTER — Encounter: Payer: Self-pay | Admitting: Family Medicine

## 2017-06-21 DIAGNOSIS — R8290 Unspecified abnormal findings in urine: Secondary | ICD-10-CM | POA: Diagnosis not present

## 2017-06-21 DIAGNOSIS — R062 Wheezing: Secondary | ICD-10-CM

## 2017-06-21 LAB — UA/M W/RFLX CULTURE, ROUTINE
Bilirubin, UA: NEGATIVE
GLUCOSE, UA: NEGATIVE
Ketones, UA: NEGATIVE
NITRITE UA: NEGATIVE
Protein, UA: NEGATIVE
RBC, UA: NEGATIVE
Specific Gravity, UA: 1.015 (ref 1.005–1.030)
Urobilinogen, Ur: 0.2 mg/dL (ref 0.2–1.0)
pH, UA: 5.5 (ref 5.0–7.5)

## 2017-06-21 LAB — MICROSCOPIC EXAMINATION: Bacteria, UA: NONE SEEN

## 2017-06-21 NOTE — Assessment & Plan Note (Signed)
Weight down 8lbs in the last month. Continue diet of 1500-1700kcal daily and at least 71minutes of exercise daily. Goal to lose 1-2lbs a week. Recheck 1 month. Call with any concerns.

## 2017-06-21 NOTE — Progress Notes (Signed)
BP 126/83   Pulse 80   Temp 98.5 F (36.9 C) (Oral)   Wt 272 lb (123.4 kg)   SpO2 93%   BMI 41.36 kg/m     Subjective:    Patient ID: Derrick Ball., male    DOB: 1956-03-19, 61 y.o.   MRN: [id number]  HPI: Derrick Trusty. is a 61 y.o. male  Chief Complaint  Patient presents with  . Follow-up  . Bronchitis    Still feeling tired, congestion  . Benign Prostatic Hypertrophy   WEIGHT GAIN- Saw Nutrition. Continuing with healthy diet for 1500-1700kcal intake daily, continuing to work out at the gym- Going at 3.5 on the treadmill- but going up and down on it. Pushing it a little harder. Going to the gym 3-4x a week, still doing 90 minutes, 2 miles walk and them weight training. Still doing the shakes with [employer]. Not seeing any any nutritionists now. Now keeping to a menu.  Duration: chronic- gained 30lbs in last 3 years  Previous attempts at weight loss: yes Complications of obesity: HTN, GERD, OA Peak weight: 280 Weight loss goal: to be healthy Weight loss to date: 8lbs  URINARY SYMPTOMS Duration: 2-3 weeks Dysuria: no Urinary frequency: yes Urgency: yes Small volume voids: yes Symptom severity: mild Urinary incontinence: no Foul odor: yes Hematuria: no Abdominal pain: no Back pain: yes Suprapubic pain/pressure: no Flank pain: no Fever:  no Vomiting: no Status: worse Previous urinary tract infection: yes Recurrent urinary tract infection: no Sexual activity: monogomous History of sexually transmitted disease: no Penile discharge: no Treatments attempted: increasing fluids   UPPER RESPIRATORY TRACT INFECTION- not sure if he got totally better from his bronchitis.  Duration: couple of weeks Worst symptom: congestion Fever: no Cough: yes Shortness of breath: no Wheezing: no Chest pain: no Chest tightness: no Chest congestion: no Nasal congestion: yes Runny nose: no Post nasal drip: yes Sneezing: yes Sore throat: no Swollen glands: no Sinus  pressure: no Headache: yes Face pain: no Toothache: no Ear pain: no  Ear pressure: no  Eyes red/itching:no Eye drainage/crusting: no  Vomiting: no Rash: no Fatigue: yes Sick contacts: no Strep contacts: no  Context: stable Recurrent sinusitis: no Relief with OTC cold/cough medications: no  Treatments attempted: anti-histamine   Relevant past medical, surgical, family and social history reviewed and updated as indicated. Interim medical history since our last visit reviewed. Allergies and medications reviewed and updated.  Review of Systems  Constitutional: Negative.   HENT: Positive for congestion, postnasal drip, rhinorrhea, sinus pain and sneezing. Negative for dental problem, drooling, ear discharge, ear pain, facial swelling, hearing loss, mouth sores, nosebleeds, sinus pressure, sore throat, tinnitus, trouble swallowing and voice change.   Respiratory: Negative.   Cardiovascular: Negative.   Genitourinary: Positive for difficulty urinating, frequency and urgency. Negative for decreased urine volume, discharge, dysuria, enuresis, flank pain, genital sores, hematuria, penile pain, penile swelling, scrotal swelling and testicular pain.  Psychiatric/Behavioral: Negative.     Per HPI unless specifically indicated above     Objective:    BP 126/83   Pulse 80   Temp 98.5 F (36.9 C) (Oral)   Wt 272 lb (123.4 kg)   SpO2 93%   BMI 41.36 kg/m    Wt Readings from Last 3 Encounters:  06/21/17 272 lb (123.4 kg)  06/06/17 272 lb 14.4 oz (123.8 kg)  05/31/17 273 lb (123.8 kg)    Physical Exam  Constitutional: He is oriented to person, place, and time.  He appears well-developed and well-nourished. No distress.  HENT:  Head: Normocephalic and atraumatic.  Right Ear: Hearing and external ear normal.  Left Ear: Hearing and external ear normal.  Nose: Nose normal.  Mouth/Throat: Oropharynx is clear and moist. No oropharyngeal exudate.  Eyes: Conjunctivae, EOM and lids are  normal. Pupils are equal, round, and reactive to light. Right eye exhibits no discharge. Left eye exhibits no discharge. No scleral icterus.  Neck: Normal range of motion. Neck supple. No JVD present. No tracheal deviation present. No thyromegaly present.  Cardiovascular: Normal rate, regular rhythm, normal heart sounds and intact distal pulses. Exam reveals no gallop and no friction rub.  No murmur heard. Pulmonary/Chest: Effort normal. No stridor. No respiratory distress. He has wheezes. He has no rales. He exhibits no tenderness.  Musculoskeletal: Normal range of motion.  Lymphadenopathy:    He has no cervical adenopathy.  Neurological: He is alert and oriented to person, place, and time.  Skin: Skin is warm, dry and intact. No rash noted. He is not diaphoretic. No erythema. No pallor.  Psychiatric: He has a normal mood and affect. His speech is normal and behavior is normal. Judgment and thought content normal. Cognition and memory are normal.  Nursing note and vitals reviewed.   Results for orders placed or performed in visit on 97/67/34  Basic metabolic panel  Result Value Ref Range   Glucose 129 (H) 65 - 99 mg/dL   BUN 22 6 - 24 mg/dL   Creatinine, Ser 0.98 0.76 - 1.27 mg/dL   GFR calc non Af Amer 84 >59 mL/min/1.73   GFR calc Af Amer 97 >59 mL/min/1.73   BUN/Creatinine Ratio 22 (H) 9 - 20   Sodium 145 (H) 134 - 144 mmol/L   Potassium 4.1 3.5 - 5.2 mmol/L   Chloride 108 (H) 96 - 106 mmol/L   CO2 19 (L) 20 - 29 mmol/L   Calcium 9.5 8.7 - 10.2 mg/dL      Assessment & Plan:   Problem List Items Addressed This Visit      Other   Morbid obesity ([place]) - Primary    Weight down 8lbs in the last month. Continue diet of 1500-1700kcal daily and at least 78minutes of exercise daily. Goal to lose 1-2lbs a week. Recheck 1 month. Call with any concerns.        Other Visit Diagnoses    Foul smelling urine       ?prostatitis. Will check urine and treat as needed.    Relevant Orders     UA/M w/rflx Culture, Routine   Wheezing       Will start anoro. Sample given today. Call with any concerns. Needs spiro next visit.        Follow up plan: Return in about 4 weeks (around 07/19/2017) for Follow up weight.

## 2017-06-23 ENCOUNTER — Telehealth: Payer: Self-pay | Admitting: Family Medicine

## 2017-06-23 NOTE — Telephone Encounter (Signed)
Copied from [place]. Topic: Quick Communication - See Telephone Encounter >> Jun 23, 2017  9:45 AM Bea Graff, NT wrote: CRM for notification. See Telephone encounter for: 06/23/17. Patient calling wanting to get lab results from his urine sample he gave last week.

## 2017-06-24 ENCOUNTER — Telehealth: Payer: Self-pay | Admitting: Family Medicine

## 2017-06-24 MED ORDER — CIPROFLOXACIN HCL 500 MG PO TABS: 500.0000 mg | ORAL_TABLET | Freq: Two times a day (BID) | ORAL | 0 refills | Status: DC

## 2017-06-24 NOTE — Telephone Encounter (Signed)
See open telephone encounter

## 2017-06-24 NOTE — Telephone Encounter (Signed)
Routing to provider  

## 2017-06-24 NOTE — Telephone Encounter (Signed)
Patient notified

## 2017-06-24 NOTE — Telephone Encounter (Signed)
Since he's still not feeling well, we'll treat him for prostatitis again. Rx at his pharmacy. Please let him know.

## 2017-06-24 NOTE — Telephone Encounter (Signed)
Called to let him know that his urine was negative. LMOM for him to call back and let us know how he's feeling.

## 2017-06-24 NOTE — Addendum Note (Signed)
Addended by: Valerie Roys on: 06/24/2017 01:03 PM   Modules accepted: Orders

## 2017-06-24 NOTE — Telephone Encounter (Signed)
Copied from [place]. Topic: General - Other >> Jun 24, 2017 10:34 AM Malena Catholic I, NT wrote: Reason for CRM: Pt returning a call to [facility] He said hi still not feeling well.Thank

## 2017-07-18 ENCOUNTER — Other Ambulatory Visit: Payer: Self-pay | Admitting: Family Medicine

## 2017-07-20 ENCOUNTER — Other Ambulatory Visit: Payer: Self-pay | Admitting: Family Medicine

## 2017-07-21 ENCOUNTER — Ambulatory Visit: Payer: Medicare HMO | Admitting: Family Medicine

## 2017-07-21 ENCOUNTER — Encounter: Payer: Self-pay | Admitting: Family Medicine

## 2017-07-21 DIAGNOSIS — R234 Changes in skin texture: Secondary | ICD-10-CM

## 2017-07-21 DIAGNOSIS — J0100 Acute maxillary sinusitis, unspecified: Secondary | ICD-10-CM | POA: Diagnosis not present

## 2017-07-21 MED ORDER — AMOXICILLIN-POT CLAVULANATE 875-125 MG PO TABS: 1.0000 | ORAL_TABLET | Freq: Two times a day (BID) | ORAL | 0 refills | Status: DC

## 2017-07-21 NOTE — Progress Notes (Signed)
BP 132/90 (BP Location: Left Arm, Cuff Size: Normal)   Pulse 62   Temp (!) 97.5 F (36.4 C)   Wt 270 lb 2 oz (122.5 kg)   SpO2 97%   BMI 41.07 kg/m    Subjective:    Patient ID: Derrick Sandoval., male    DOB: 01/12/1956, 61 y.o.   MRN: [id number]  HPI: Derrick Sandoval. is a 61 y.o. male  Chief Complaint  Patient presents with  . Weight Check  . Nasal Congestion  . Cracked feet   WEIGHT GAIN- Has been having 3oz meats. No white foods. He has been eating a lot of fruit and veggies. Avoiding starches. Continuing with healthy diet for 1500-1700kcal intake daily, continuing to work out at the gym- Going at 3-3.5 on the treadmill- but going up and down on it. Not doing as well as he hasn't really felt like going to the gym. Going to the gym 3-4x a week, still doing 90 minutes, 2-2.5 miles walk and then weight training. Still doing the shakes with [employer]. Not seeing any any nutritionists now. Now keeping to a menu.  Duration: chronic- gained 30lbs in last 3 years  Previous attempts at weight loss: yes Complications of obesity: HTN, GERD, OA Peak weight: 280 Weight loss goal: to be healthy Weight loss to date: 10lbs  UPPER RESPIRATORY TRACT INFECTION Duration: Chronic, seems like he's still sick from last time.  Worst symptom: chest congestion Fever: no Cough: no Shortness of breath: no Wheezing: no Chest pain: no Chest tightness: no Chest congestion: no Nasal congestion: yes Runny nose: yes Post nasal drip: yes Sneezing: yes Sore throat: no Swollen glands: no Sinus pressure: yes Headache: yes Face pain: yes Toothache: no Ear pain: no  Ear pressure: yes bilateral Eyes red/itching:no Eye drainage/crusting: no  Vomiting: no Rash: no Fatigue: yes Sick contacts: no Strep contacts: no  Context: stable Recurrent sinusitis: no Relief with OTC cold/cough medications: no  Treatments attempted: claritin, flonase, singulair   Has been having really dry cracked skin  on his feet. Using lotion without benefit. They are cracking and hurting.   Relevant past medical, surgical, family and social history reviewed and updated as indicated. Interim medical history since our last visit reviewed. Allergies and medications reviewed and updated.  Review of Systems  Constitutional: Negative.   HENT: Positive for congestion, ear pain, postnasal drip, rhinorrhea, sinus pressure, sinus pain, sneezing and sore throat. Negative for dental problem, drooling, ear discharge, facial swelling, hearing loss, mouth sores, nosebleeds, tinnitus, trouble swallowing and voice change.   Respiratory: Negative.   Cardiovascular: Negative.   Psychiatric/Behavioral: Negative.     Per HPI unless specifically indicated above     Objective:    BP 132/90 (BP Location: Left Arm, Cuff Size: Normal)   Pulse 62   Temp (!) 97.5 F (36.4 C)   Wt 270 lb 2 oz (122.5 kg)   SpO2 97%   BMI 41.07 kg/m   Wt Readings from Last 3 Encounters:  07/21/17 270 lb 2 oz (122.5 kg)  06/21/17 272 lb (123.4 kg)  06/06/17 272 lb 14.4 oz (123.8 kg)    Physical Exam  Constitutional: He is oriented to person, place, and time. He appears well-developed and well-nourished. No distress.  HENT:  Head: Normocephalic and atraumatic.  Right Ear: Hearing, tympanic membrane, external ear and ear canal normal.  Left Ear: Hearing, tympanic membrane, external ear and ear canal normal.  Nose: Mucosal edema and rhinorrhea present. Right  sinus exhibits maxillary sinus tenderness. Right sinus exhibits no frontal sinus tenderness. Left sinus exhibits maxillary sinus tenderness. Left sinus exhibits no frontal sinus tenderness.  Mouth/Throat: Uvula is midline, oropharynx is clear and moist and mucous membranes are normal. No oropharyngeal exudate.  Eyes: Conjunctivae, EOM and lids are normal. Pupils are equal, round, and reactive to light. Right eye exhibits no discharge. Left eye exhibits no discharge. No scleral icterus.    Neck: Normal range of motion. Neck supple. No JVD present. No tracheal deviation present. No thyromegaly present.  Cardiovascular: Normal rate, regular rhythm, normal heart sounds and intact distal pulses. Exam reveals no gallop and no friction rub.  No murmur heard. Pulmonary/Chest: Effort normal and breath sounds normal. No stridor. No respiratory distress. He has no wheezes. He has no rales. He exhibits no tenderness.  Abdominal: Soft.  Musculoskeletal: Normal range of motion.  Lymphadenopathy:    He has no cervical adenopathy.  Neurological: He is alert and oriented to person, place, and time.  Skin: Skin is warm, dry and intact. No rash noted. He is not diaphoretic. No erythema. No pallor.  Psychiatric: He has a normal mood and affect. His speech is normal and behavior is normal. Judgment and thought content normal. Cognition and memory are normal.  Nursing note and vitals reviewed.   Results for orders placed or performed in visit on 06/21/17  Microscopic Examination  Result Value Ref Range   WBC, UA 0-5 0 - 5 /hpf   RBC, UA 0-2 0 - 2 /hpf   Epithelial Cells (non renal) 0-10 0 - 10 /hpf   Bacteria, UA None seen None seen/Few  UA/M w/rflx Culture, Routine  Result Value Ref Range   Specific Gravity, UA 1.015 1.005 - 1.030   pH, UA 5.5 5.0 - 7.5   Color, UA Yellow Yellow   Appearance Ur Hazy (A) Clear   Leukocytes, UA Trace (A) Negative   Protein, UA Negative Negative/Trace   Glucose, UA Negative Negative   Ketones, UA Negative Negative   RBC, UA Negative Negative   Bilirubin, UA Negative Negative   Urobilinogen, Ur 0.2 0.2 - 1.0 mg/dL   Nitrite, UA Negative Negative   Microscopic Examination See below:       Assessment & Plan:   Problem List Items Addressed This Visit      Other   Morbid obesity ([place]) - Primary    Weight down 2lbs in the last month for a total of 10lbs in 2 months. Off a little bit because of the holidays. Continue diet of 1500-1700kcal daily and at  least 69minutes of exercise daily. Goal to lose 1-2lbs a week. Recheck 1 month. Call with any concerns.        Other Visit Diagnoses    Cracked skin on feet       Continue moisturizing. Will get him into podiatry. Referral generated today.   Relevant Orders   Ambulatory referral to Podiatry   Acute non-recurrent maxillary sinusitis       Will treat with augmentin. Call with any concerns or if not getting better.    Relevant Medications   amoxicillin-clavulanate (AUGMENTIN) 875-125 MG tablet       Follow up plan: Return in about 4 weeks (around 08/18/2017) for Follow up weight.

## 2017-07-21 NOTE — Assessment & Plan Note (Signed)
Weight down 2lbs in the last month for a total of 10lbs in 2 months. Off a little bit because of the holidays. Continue diet of 1500-1700kcal daily and at least 27minutes of exercise daily. Goal to lose 1-2lbs a week. Recheck 1 month. Call with any concerns.

## 2017-08-23 ENCOUNTER — Ambulatory Visit (INDEPENDENT_AMBULATORY_CARE_PROVIDER_SITE_OTHER): Payer: PPO | Admitting: Family Medicine

## 2017-08-23 ENCOUNTER — Encounter: Payer: Self-pay | Admitting: Family Medicine

## 2017-08-23 DIAGNOSIS — N401 Enlarged prostate with lower urinary tract symptoms: Secondary | ICD-10-CM | POA: Diagnosis not present

## 2017-08-23 DIAGNOSIS — N138 Other obstructive and reflux uropathy: Secondary | ICD-10-CM | POA: Diagnosis not present

## 2017-08-23 DIAGNOSIS — K219 Gastro-esophageal reflux disease without esophagitis: Secondary | ICD-10-CM | POA: Diagnosis not present

## 2017-08-23 DIAGNOSIS — R8290 Unspecified abnormal findings in urine: Secondary | ICD-10-CM

## 2017-08-23 LAB — UA/M W/RFLX CULTURE, ROUTINE
BILIRUBIN UA: NEGATIVE
Glucose, UA: NEGATIVE
Ketones, UA: NEGATIVE
Leukocytes, UA: NEGATIVE
Nitrite, UA: NEGATIVE
PH UA: 6 (ref 5.0–7.5)
Protein, UA: NEGATIVE
RBC, UA: NEGATIVE
Specific Gravity, UA: 1.01 (ref 1.005–1.030)
UUROB: 0.2 mg/dL (ref 0.2–1.0)

## 2017-08-23 MED ORDER — OMEPRAZOLE 40 MG PO CPDR: 40.0000 mg | DELAYED_RELEASE_CAPSULE | Freq: Every day | ORAL | 1 refills | Status: DC

## 2017-08-23 MED ORDER — FINASTERIDE 5 MG PO TABS: 5.0000 mg | ORAL_TABLET | Freq: Every day | ORAL | 3 refills | Status: DC

## 2017-08-23 NOTE — Assessment & Plan Note (Signed)
Not doing great. Will get him started on finasteride and recheck 1 month. Call with any concerns.

## 2017-08-23 NOTE — Assessment & Plan Note (Signed)
Will treat with omeprazole. Recheck 1 month. Call with any concerns.

## 2017-08-23 NOTE — Assessment & Plan Note (Signed)
Weight down 1lbs in the last month for a total of 11lbs in 3 months. Off a little bit because of the holidays. Continue diet of 1500-1700kcal daily and at least 35minutes of exercise daily. Goal to lose 1-2lbs a week. Recheck 1 month. Call with any concerns.

## 2017-08-23 NOTE — Progress Notes (Signed)
BP (!) 145/91 (BP Location: Left Arm, Patient Position: Sitting, Cuff Size: Large)   Pulse 65   Temp 98.6 F (37 C)   Wt 269 lb 6 oz (122.2 kg)   SpO2 98%   BMI 40.96 kg/m    Subjective:    Patient ID: Derrick Ball., male    DOB: 1956/02/10, 62 y.o.   MRN: [id number]  HPI: Derrick Kerney. is a 62 y.o. male  Chief Complaint  Patient presents with  . Obesity  . chest congestion   WEIGHT GAIN- Has not been going the gym since before Christmas because of the holidays. Not eating what he should because of the holidays. Has been having 3-4oz meats. Avoiding white foods- but ate them over the holidays. He has been eating a lot of fruit and veggies. Avoiding starches. Continuing with healthy diet for 1500-1700kcal intake daily, continuing to work out at the gym- Going at 3-3.5 on the treadmill- but going up and down on it, has not been doing great with this. Before the holidays, was still going to the gym 3-4x a week, still doing 90 minutes, 2-2.5 miles walk and then weight training. Still doing the shakes with [employer]. Not seeing any any nutritionists now. .  Duration: chronic- gained 30lbs in last 3 years  Previous attempts at weight loss: yes Complications of obesity: HTN, GERD, OA Peak weight: 280 Weight loss goal: to be healthy Weight loss to date: 11lbs  Sinuses are feeling better. He notes his chest is still feeling congested. Coughing when he was eating. Has not been having any trouble breathing.   A couple of weeks ago, was having bowel urgency where he was having diarrhea every time he ate or coughed or sneezed- small amounts. He notes that he was having 3-4x a day. Was not having any pain in his belly. He notes that his backside was sore. Now back to normal, hasn't had a BM in 2 days. Had been taking miralax daily before that.   He notes that his urine is having a strong smell again. He is not having any pain, feels like his urine is taking too long to get out. No other  concerns.   Relevant past medical, surgical, family and social history reviewed and updated as indicated. Interim medical history since our last visit reviewed. Allergies and medications reviewed and updated.  Review of Systems  Constitutional: Negative.   HENT: Positive for congestion. Negative for dental problem, drooling, ear discharge, ear pain, facial swelling, hearing loss, mouth sores, nosebleeds, postnasal drip, rhinorrhea, sinus pressure, sinus pain, sneezing, sore throat, tinnitus, trouble swallowing and voice change.   Respiratory: Negative.   Cardiovascular: Negative.   Gastrointestinal: Positive for diarrhea. Negative for abdominal distention, abdominal pain, anal bleeding, blood in stool, constipation, nausea, rectal pain and vomiting.  Musculoskeletal: Negative.   Skin: Negative.   Psychiatric/Behavioral: Negative.     Per HPI unless specifically indicated above     Objective:    BP (!) 145/91 (BP Location: Left Arm, Patient Position: Sitting, Cuff Size: Large)   Pulse 65   Temp 98.6 F (37 C)   Wt 269 lb 6 oz (122.2 kg)   SpO2 98%   BMI 40.96 kg/m   Wt Readings from Last 3 Encounters:  08/23/17 269 lb 6 oz (122.2 kg)  07/21/17 270 lb 2 oz (122.5 kg)  06/21/17 272 lb (123.4 kg)    Physical Exam  Constitutional: He is oriented to person, place, and time.  He appears well-developed and well-nourished. No distress.  HENT:  Head: Normocephalic and atraumatic.  Right Ear: Hearing and external ear normal.  Left Ear: Hearing and external ear normal.  Nose: Nose normal.  Mouth/Throat: Oropharynx is clear and moist. No oropharyngeal exudate.  Eyes: Conjunctivae, EOM and lids are normal. Pupils are equal, round, and reactive to light. Right eye exhibits no discharge. Left eye exhibits no discharge. No scleral icterus.  Cardiovascular: Normal rate, regular rhythm, normal heart sounds and intact distal pulses. Exam reveals no gallop and no friction rub.  No murmur  heard. Pulmonary/Chest: Effort normal and breath sounds normal. No respiratory distress. He has no wheezes. He has no rales. He exhibits no tenderness.  Abdominal: Soft. Bowel sounds are normal. He exhibits no distension and no mass. There is no tenderness. There is no rebound and no guarding.  Musculoskeletal: Normal range of motion.  Neurological: He is alert and oriented to person, place, and time.  Skin: Skin is warm, dry and intact. No rash noted. He is not diaphoretic. No erythema. No pallor.  Psychiatric: He has a normal mood and affect. His speech is normal and behavior is normal. Judgment and thought content normal. Cognition and memory are normal.  Nursing note and vitals reviewed.   Results for orders placed or performed in visit on 06/21/17  Microscopic Examination  Result Value Ref Range   WBC, UA 0-5 0 - 5 /hpf   RBC, UA 0-2 0 - 2 /hpf   Epithelial Cells (non renal) 0-10 0 - 10 /hpf   Bacteria, UA None seen None seen/Few  UA/M w/rflx Culture, Routine  Result Value Ref Range   Specific Gravity, UA 1.015 1.005 - 1.030   pH, UA 5.5 5.0 - 7.5   Color, UA Yellow Yellow   Appearance Ur Hazy (A) Clear   Leukocytes, UA Trace (A) Negative   Protein, UA Negative Negative/Trace   Glucose, UA Negative Negative   Ketones, UA Negative Negative   RBC, UA Negative Negative   Bilirubin, UA Negative Negative   Urobilinogen, Ur 0.2 0.2 - 1.0 mg/dL   Nitrite, UA Negative Negative   Microscopic Examination See below:       Assessment & Plan:   Problem List Items Addressed This Visit      Digestive   GERD (gastroesophageal reflux disease)    Will treat with omeprazole. Recheck 1 month. Call with any concerns.       Relevant Medications   omeprazole (PRILOSEC) 40 MG capsule     Genitourinary   Benign prostatic hyperplasia with lower urinary tract symptoms    Not doing great. Will get him started on finasteride and recheck 1 month. Call with any concerns.       Relevant  Medications   finasteride (PROSCAR) 5 MG tablet     Other   Morbid obesity ([place]) - Primary    Weight down 1lbs in the last month for a total of 11lbs in 3 months. Off a little bit because of the holidays. Continue diet of 1500-1700kcal daily and at least 38minutes of exercise daily. Goal to lose 1-2lbs a week. Recheck 1 month. Call with any concerns.        Other Visit Diagnoses    Foul smelling urine       Will check urine. Await results.    Relevant Orders   UA/M w/rflx Culture, Routine       Follow up plan: Return in about 4 weeks (around 09/20/2017).

## 2017-08-23 NOTE — Addendum Note (Signed)
Addended by: Valerie Roys on: 08/23/2017 10:36 AM   Modules accepted: Orders

## 2017-08-29 ENCOUNTER — Other Ambulatory Visit: Payer: Self-pay | Admitting: Family Medicine

## 2017-08-29 ENCOUNTER — Telehealth: Payer: Self-pay

## 2017-08-29 MED ORDER — MONTELUKAST SODIUM 10 MG PO TABS: 10.0000 mg | ORAL_TABLET | Freq: Every day | ORAL | 3 refills | Status: DC

## 2017-08-29 MED ORDER — DICLOFENAC SODIUM 1 % TD GEL: 4.0000 g | Freq: Four times a day (QID) | TRANSDERMAL | 4 refills | Status: DC

## 2017-08-29 MED ORDER — LISINOPRIL 20 MG PO TABS: 20.0000 mg | ORAL_TABLET | Freq: Every day | ORAL | 1 refills | Status: DC

## 2017-08-29 MED ORDER — FLUTICASONE PROPIONATE 50 MCG/ACT NA SUSP: 1.0000 | Freq: Two times a day (BID) | NASAL | 12 refills | Status: DC

## 2017-08-29 MED ORDER — LORATADINE 10 MG PO TABS: 10.0000 mg | ORAL_TABLET | Freq: Every day | ORAL | 4 refills | Status: DC

## 2017-08-29 MED ORDER — ALBUTEROL SULFATE HFA 108 (90 BASE) MCG/ACT IN AERS: 2.0000 | INHALATION_SPRAY | Freq: Four times a day (QID) | RESPIRATORY_TRACT | 3 refills | Status: DC | PRN

## 2017-08-29 MED ORDER — TAMSULOSIN HCL 0.4 MG PO CAPS: 0.4000 mg | ORAL_CAPSULE | Freq: Two times a day (BID) | ORAL | 1 refills | Status: DC

## 2017-08-29 MED ORDER — NYSTATIN 100000 UNIT/GM EX CREA: TOPICAL_CREAM | CUTANEOUS | 6 refills | Status: DC

## 2017-08-29 MED ORDER — RANITIDINE HCL 300 MG PO TABS: 300.0000 mg | ORAL_TABLET | Freq: Every day | ORAL | 1 refills | Status: DC

## 2017-08-29 MED ORDER — FINASTERIDE 5 MG PO TABS: 5.0000 mg | ORAL_TABLET | Freq: Every day | ORAL | 3 refills | Status: DC

## 2017-08-29 MED ORDER — OMEPRAZOLE 40 MG PO CPDR: 40.0000 mg | DELAYED_RELEASE_CAPSULE | Freq: Every day | ORAL | 1 refills | Status: DC

## 2017-08-29 MED ORDER — ACYCLOVIR 400 MG PO TABS: 400.0000 mg | ORAL_TABLET | Freq: Two times a day (BID) | ORAL | 1 refills | Status: DC

## 2017-08-29 NOTE — Telephone Encounter (Signed)
Rxs sent

## 2017-08-29 NOTE — Telephone Encounter (Signed)
Patient would like albuterol and voltaren gel sent to his new pharmacy

## 2017-09-09 ENCOUNTER — Ambulatory Visit (INDEPENDENT_AMBULATORY_CARE_PROVIDER_SITE_OTHER): Payer: PPO | Admitting: Family Medicine

## 2017-09-09 ENCOUNTER — Encounter: Payer: Self-pay | Admitting: Family Medicine

## 2017-09-09 VITALS — BP 128/84 | HR 77 | Temp 97.7°F | Resp 18 | Wt 275.4 lb

## 2017-09-09 DIAGNOSIS — R509 Fever, unspecified: Secondary | ICD-10-CM | POA: Diagnosis not present

## 2017-09-09 DIAGNOSIS — R0981 Nasal congestion: Secondary | ICD-10-CM | POA: Diagnosis not present

## 2017-09-09 LAB — VERITOR FLU A/B WAIVED
Influenza A: NEGATIVE
Influenza B: NEGATIVE

## 2017-09-09 MED ORDER — MONTELUKAST SODIUM 10 MG PO TABS: 10.0000 mg | ORAL_TABLET | Freq: Every day | ORAL | 3 refills | Status: DC

## 2017-09-09 MED ORDER — LORATADINE 10 MG PO TABS: 10.0000 mg | ORAL_TABLET | Freq: Every day | ORAL | 4 refills | Status: DC

## 2017-09-09 NOTE — Progress Notes (Signed)
BP 128/84 (BP Location: Left Arm, Patient Position: Sitting)   Pulse 77   Temp 97.7 F (36.5 C) (Oral)   Resp 18   Wt 275 lb 6.4 oz (124.9 kg)   SpO2 95%   BMI 41.87 kg/m    Subjective:    Patient ID: [name], male    DOB: 1956-02-24, 62 y.o.   MRN: [id number]  HPI: Daijon Wenke. is a 62 y.o. male  Chief Complaint  Patient presents with  . Cough    non-productive   . Headache  . Nasal Congestion   UPPER RESPIRATORY TRACT INFECTION Duration: Since October- never got better  Worst symptom: congested, breathing isn't good Fever: yes Cough: yes Shortness of breath: no Wheezing: yes Chest pain: yes Chest tightness: yes Chest congestion: yes Nasal congestion: yes Runny nose: yes Post nasal drip: yes Sneezing: yes Sore throat: no Swollen glands: no Sinus pressure: no Headache: yes Face pain: no Toothache: no Ear pain:  no Ear pressure: yes, Right  Eyes red/itching:no Eye drainage/crusting: no  Vomiting: no Rash: no Fatigue: yes Sick contacts: no Strep contacts: no  Context: stable Recurrent sinusitis: no Relief with OTC cold/cough medications: no  Treatments attempted: anti-histamine and pseudoephedrine   Relevant past medical, surgical, family and social history reviewed and updated as indicated. Interim medical history since our last visit reviewed. Allergies and medications reviewed and updated.  Review of Systems  Constitutional: Positive for fatigue and fever. Negative for activity change, appetite change, chills, diaphoresis and unexpected weight change.  HENT: Positive for congestion, postnasal drip, rhinorrhea, sinus pressure, sneezing and sore throat. Negative for dental problem, drooling, ear discharge, ear pain, facial swelling, hearing loss, mouth sores, nosebleeds, sinus pain, trouble swallowing and voice change.   Respiratory: Positive for cough, chest tightness, shortness of breath and wheezing. Negative for apnea, choking  and stridor.   Cardiovascular: Negative.   Neurological: Negative.   Psychiatric/Behavioral: Negative.     Per HPI unless specifically indicated above     Objective:    BP 128/84 (BP Location: Left Arm, Patient Position: Sitting)   Pulse 77   Temp 97.7 F (36.5 C) (Oral)   Resp 18   Wt 275 lb 6.4 oz (124.9 kg)   SpO2 95%   BMI 41.87 kg/m   Wt Readings from Last 3 Encounters:  09/09/17 275 lb 6.4 oz (124.9 kg)  08/23/17 269 lb 6 oz (122.2 kg)  07/21/17 270 lb 2 oz (122.5 kg)    Physical Exam  Constitutional: He is oriented to person, place, and time. He appears well-developed and well-nourished. No distress.  HENT:  Head: Normocephalic and atraumatic.  Right Ear: Hearing and external ear normal.  Left Ear: Hearing and external ear normal.  Nose: Nose normal.  Mouth/Throat: Oropharynx is clear and moist. No oropharyngeal exudate.  Eyes: Conjunctivae, EOM and lids are normal. Pupils are equal, round, and reactive to light. Right eye exhibits no discharge. Left eye exhibits no discharge. No scleral icterus.  Neck: Normal range of motion. Neck supple. No JVD present. No tracheal deviation present. No thyromegaly present.  Cardiovascular: Normal rate, regular rhythm, normal heart sounds and intact distal pulses. Exam reveals no gallop and no friction rub.  No murmur heard. Pulmonary/Chest: Effort normal and breath sounds normal. No stridor. No respiratory distress. He has no wheezes. He has no rales. He exhibits no tenderness.  Musculoskeletal: Normal range of motion.  Lymphadenopathy:    He has no cervical adenopathy.  Neurological:  He is alert and oriented to person, place, and time.  Skin: Skin is warm, dry and intact. No rash noted. He is not diaphoretic. No erythema. No pallor.  Psychiatric: He has a normal mood and affect. His speech is normal and behavior is normal. Judgment and thought content normal. Cognition and memory are normal.    Results for orders placed or  performed in visit on 08/23/17  UA/M w/rflx Culture, Routine  Result Value Ref Range   Specific Gravity, UA 1.010 1.005 - 1.030   pH, UA 6.0 5.0 - 7.5   Color, UA Yellow Yellow   Appearance Ur Clear Clear   Leukocytes, UA Negative Negative   Protein, UA Negative Negative/Trace   Glucose, UA Negative Negative   Ketones, UA Negative Negative   RBC, UA Negative Negative   Bilirubin, UA Negative Negative   Urobilinogen, Ur 0.2 0.2 - 1.0 mg/dL   Nitrite, UA Negative Negative      Assessment & Plan:   Problem List Items Addressed This Visit    None    Visit Diagnoses    Chronic nasal congestion    -  Primary   No sign of bacterial infection. Has been feeling poorly for almost 4 months. Will get him into ENT for evaluation. Restart claritin and singulair.   Relevant Orders   Ambulatory referral to ENT   Fever, unspecified fever cause       Flu negative- no fever in several days.    Relevant Orders   Veritor Flu A/B Waived       Follow up plan: Return As scheduled.

## 2017-09-14 ENCOUNTER — Telehealth: Payer: Self-pay | Admitting: Family Medicine

## 2017-09-14 ENCOUNTER — Other Ambulatory Visit: Payer: Self-pay | Admitting: Family Medicine

## 2017-09-14 NOTE — Telephone Encounter (Signed)
Form filled out to be faxed yesterday.

## 2017-09-14 NOTE — Telephone Encounter (Signed)
Copied from [place]. Topic: Quick Communication - Rx Refill/Question >> Sep 14, 2017 10:33 AM Arletha Grippe wrote: Medication: nystatin cream (MYCOSTATIN) and albuterol (PROVENTIL HFA;VENTOLIN HFA) 108 (90 Base) MCG/ACT inhaler      Has the patient contacted their pharmacy? Yes.     (Agent: If no, request that the patient contact the pharmacy for the refill.)   Preferred Pharmacy (with phone number or street name): [facility] - has been sending faxes and hasnt heard anything back    Agent: Please be advised that RX refills may take up to 3 business days. We ask that you follow-up with your pharmacy.

## 2017-09-23 ENCOUNTER — Ambulatory Visit (INDEPENDENT_AMBULATORY_CARE_PROVIDER_SITE_OTHER): Payer: PPO | Admitting: Family Medicine

## 2017-09-23 ENCOUNTER — Encounter: Payer: Self-pay | Admitting: Family Medicine

## 2017-09-23 DIAGNOSIS — N138 Other obstructive and reflux uropathy: Secondary | ICD-10-CM | POA: Diagnosis not present

## 2017-09-23 DIAGNOSIS — R0981 Nasal congestion: Secondary | ICD-10-CM | POA: Diagnosis not present

## 2017-09-23 DIAGNOSIS — N401 Enlarged prostate with lower urinary tract symptoms: Secondary | ICD-10-CM | POA: Diagnosis not present

## 2017-09-23 MED ORDER — TRIAMCINOLONE ACETONIDE 0.5 % EX OINT: TOPICAL_OINTMENT | CUTANEOUS | 1 refills | Status: DC

## 2017-09-23 NOTE — Progress Notes (Signed)
BP (!) 145/97 (BP Location: Left Arm, Patient Position: Sitting, Cuff Size: Large)   Pulse 66   Temp 98.5 F (36.9 C)   Wt 276 lb (125.2 kg)   SpO2 96%   BMI 41.97 kg/m    Subjective:    Patient ID: Derrick Sandoval., male    DOB: September 30, 1955, 62 y.o.   MRN: [id number]  HPI: Derrick Sandoval. is a 62 y.o. male  Chief Complaint  Patient presents with  . Weight Check   WEIGHT GAIN-Derrick Sandoval has not been doing what he's supposed to be doing. This is his 5th month of formal weight loss visits here before he can go back to the bariatric clinic at [facility]. He has gained 7lbs in the last month. Has been feeling tired and wants to sleep all the time. Hasn't been feeling congested and not feeling like himself. Urine still not quite right, still has a strong ammonia smell. He foes think the proscar has been helping. He is going to go back the urologist. He has only been going to the gym 2x a week, staying about an hour and a half. Has been doing about 2 miles on 41mph on the treadmill and has been doing some weight machine exercises and belly crunches.  Still not eating anything white (no pasta, no rice or bread) Has been eating 3 chicken tenders or 3oz of meat, will be having meat. Has been drinking water or diet coke and 1% milk- 4-5 glasses at night. Has been having big breakfasts about 2x a day with his grandkids, will have a lean cuisine for lunch on those days. Has not been counting calories.  Duration: chronic- gained 30lbs in last 3 years  Previous attempts at weight loss: yes Complications of obesity: HTN, GERD, OA Peak weight: 280 Weight loss goal: to be healthy Weight loss to date:4lbs  Relevant past medical, surgical, family and social history reviewed and updated as indicated. Interim medical history since our last visit reviewed. Allergies and medications reviewed and updated.  Review of Systems  Constitutional: Negative.   HENT: Positive for congestion, postnasal drip, rhinorrhea  and sinus pressure. Negative for dental problem, drooling, ear discharge, ear pain, facial swelling, hearing loss, mouth sores, nosebleeds, sinus pain, sneezing, sore throat, tinnitus, trouble swallowing and voice change.   Respiratory: Negative.   Cardiovascular: Negative.   Psychiatric/Behavioral: Negative.     Per HPI unless specifically indicated above     Objective:    BP (!) 145/97 (BP Location: Left Arm, Patient Position: Sitting, Cuff Size: Large)   Pulse 66   Temp 98.5 F (36.9 C)   Wt 276 lb (125.2 kg)   SpO2 96%   BMI 41.97 kg/m   Wt Readings from Last 3 Encounters:  09/23/17 276 lb (125.2 kg)  09/09/17 275 lb 6.4 oz (124.9 kg)  08/23/17 269 lb 6 oz (122.2 kg)    Physical Exam  Constitutional: He is oriented to person, place, and time. He appears well-developed and well-nourished. No distress.  HENT:  Head: Normocephalic and atraumatic.  Right Ear: Hearing normal.  Left Ear: Hearing normal.  Nose: Nose normal.  Eyes: Conjunctivae and lids are normal. Right eye exhibits no discharge. Left eye exhibits no discharge. No scleral icterus.  Cardiovascular: Normal rate, regular rhythm, normal heart sounds and intact distal pulses. Exam reveals no gallop and no friction rub.  No murmur heard. Pulmonary/Chest: Effort normal and breath sounds normal. No respiratory distress. He has no wheezes. He has  no rales. He exhibits no tenderness.  Musculoskeletal: Normal range of motion.  Neurological: He is alert and oriented to person, place, and time.  Skin: Skin is warm, dry and intact. No rash noted. He is not diaphoretic. No erythema. No pallor.  Psychiatric: He has a normal mood and affect. His speech is normal and behavior is normal. Judgment and thought content normal. Cognition and memory are normal.  Nursing note and vitals reviewed.   Results for orders placed or performed in visit on 09/09/17  Veritor Flu A/B Waived  Result Value Ref Range   Influenza A Negative  Negative   Influenza B Negative Negative      Assessment & Plan:   Problem List Items Addressed This Visit      Genitourinary   Benign prostatic hyperplasia with lower urinary tract symptoms    Encouraged patient to go back to see urologist. He's not sure if he needs a new referral with his insurance. New referral placed today.      Relevant Orders   Ambulatory referral to Urology     Other   Morbid obesity [facility]) - Primary    Not doing well. Eating/drinking too many calories and has decreased exercise. Gained 7lbs in the last month. Encouraged stopping milk or decreasing to 1 cup a day. Increase exercise. Try some basic calorie counting, even to be aware of what he's eating. Recheck 1 month.        Other Visit Diagnoses    Chronic nasal congestion       Stable. Encouraged him to keep his appointment with ENT. Call with any concerns.        Follow up plan: Return in about 4 weeks (around 10/21/2017) for Follow up weight.

## 2017-09-23 NOTE — Assessment & Plan Note (Signed)
Encouraged patient to go back to see urologist. He's not sure if he needs a new referral with his insurance. New referral placed today.

## 2017-09-23 NOTE — Assessment & Plan Note (Signed)
Not doing well. Eating/drinking too many calories and has decreased exercise. Gained 7lbs in the last month. Encouraged stopping milk or decreasing to 1 cup a day. Increase exercise. Try some basic calorie counting, even to be aware of what he's eating. Recheck 1 month.

## 2017-09-30 DIAGNOSIS — J310 Chronic rhinitis: Secondary | ICD-10-CM | POA: Diagnosis not present

## 2017-09-30 DIAGNOSIS — J0141 Acute recurrent pansinusitis: Secondary | ICD-10-CM | POA: Diagnosis not present

## 2017-09-30 DIAGNOSIS — J019 Acute sinusitis, unspecified: Secondary | ICD-10-CM | POA: Diagnosis not present

## 2017-10-05 ENCOUNTER — Telehealth: Payer: Self-pay | Admitting: Family Medicine

## 2017-10-05 NOTE — Telephone Encounter (Signed)
This has already been called in. More than 1x

## 2017-10-05 NOTE — Telephone Encounter (Signed)
Copied from [place] [phone]. Topic: General - Other >> Oct 05, 2017 11:59 AM Darl Householder, RMA wrote: Reason for CRM: Roxanne from [facility] mail order requesting a 90-day prescription for triamcinolone ointment (KENALOG) 0.5 %, please call pharmacy [phone]

## 2017-10-06 DIAGNOSIS — S61254A Open bite of right ring finger without damage to nail, initial encounter: Secondary | ICD-10-CM | POA: Diagnosis not present

## 2017-10-06 NOTE — Telephone Encounter (Signed)
Tried to call pharmacy, no answer, will try again.  

## 2017-10-07 ENCOUNTER — Telehealth: Payer: Self-pay | Admitting: Family Medicine

## 2017-10-07 MED ORDER — TRIAMCINOLONE ACETONIDE 0.5 % EX OINT: TOPICAL_OINTMENT | CUTANEOUS | 1 refills | Status: DC

## 2017-10-07 NOTE — Telephone Encounter (Signed)
Copied from [place]. Topic: Quick Communication - See Telephone Encounter >> Oct 07, 2017  3:58 PM Arletha Grippe wrote: CRM for notification. See Telephone encounter for:   10/07/17. [facility] called - they want to know if 45 gram on the rx is intended for 90 day supply? Or is there a different amount that is needed for 90 days? Cb [phone] Merrilee Seashore called - but you can leave a voicemail, direct number make sure you leave the callers first and full last name

## 2017-10-07 NOTE — Telephone Encounter (Signed)
Rx sent to his pharmacy

## 2017-10-07 NOTE — Telephone Encounter (Signed)
Please send cream to [facility]

## 2017-10-08 ENCOUNTER — Emergency Department
Admission: EM | Admit: 2017-10-08 | Discharge: 2017-10-08 | Disposition: A | Discharge status: Home / Self Care | Payer: PPO | Attending: Emergency Medicine | Admitting: Emergency Medicine

## 2017-10-08 ENCOUNTER — Other Ambulatory Visit: Payer: Self-pay

## 2017-10-08 ENCOUNTER — Encounter: Payer: Self-pay | Admitting: Emergency Medicine

## 2017-10-08 ENCOUNTER — Emergency Department: Payer: PPO

## 2017-10-08 DIAGNOSIS — Z7982 Long term (current) use of aspirin: Secondary | ICD-10-CM | POA: Diagnosis not present

## 2017-10-08 DIAGNOSIS — S61451A Open bite of right hand, initial encounter: Secondary | ICD-10-CM | POA: Insufficient documentation

## 2017-10-08 DIAGNOSIS — I10 Essential (primary) hypertension: Secondary | ICD-10-CM | POA: Diagnosis not present

## 2017-10-08 DIAGNOSIS — Y92009 Unspecified place in unspecified non-institutional (private) residence as the place of occurrence of the external cause: Secondary | ICD-10-CM | POA: Diagnosis not present

## 2017-10-08 DIAGNOSIS — W540XXA Bitten by dog, initial encounter: Secondary | ICD-10-CM | POA: Diagnosis not present

## 2017-10-08 DIAGNOSIS — E785 Hyperlipidemia, unspecified: Secondary | ICD-10-CM | POA: Diagnosis not present

## 2017-10-08 DIAGNOSIS — Y939 Activity, unspecified: Secondary | ICD-10-CM | POA: Diagnosis not present

## 2017-10-08 DIAGNOSIS — Z23 Encounter for immunization: Secondary | ICD-10-CM | POA: Diagnosis not present

## 2017-10-08 DIAGNOSIS — Y998 Other external cause status: Secondary | ICD-10-CM | POA: Diagnosis not present

## 2017-10-08 DIAGNOSIS — Z79899 Other long term (current) drug therapy: Secondary | ICD-10-CM | POA: Diagnosis not present

## 2017-10-08 MED ORDER — AMOXICILLIN-POT CLAVULANATE 875-125 MG PO TABS: 1.0000 | ORAL_TABLET | Freq: Two times a day (BID) | ORAL | 0 refills | Status: DC

## 2017-10-08 MED ORDER — BACITRACIN ZINC 500 UNIT/GM EX OINT
TOPICAL_OINTMENT | CUTANEOUS | Status: AC
  Administered 2017-10-08: 18:00:00
  Filled 2017-10-08: qty 0.9

## 2017-10-08 MED ORDER — LIDOCAINE HCL (PF) 1 % IJ SOLN
5.0000 mL | Freq: Once | INTRAMUSCULAR | Status: AC
  Administered 2017-10-08: 5 mL via INTRADERMAL
  Filled 2017-10-08: qty 5

## 2017-10-08 MED ORDER — AMOXICILLIN-POT CLAVULANATE 875-125 MG PO TABS
1.0000 | ORAL_TABLET | Freq: Once | ORAL | Status: AC
  Administered 2017-10-08: 1 via ORAL
  Filled 2017-10-08: qty 1

## 2017-10-08 MED ORDER — OXYCODONE-ACETAMINOPHEN 5-325 MG PO TABS
1.0000 | ORAL_TABLET | Freq: Once | ORAL | Status: AC
  Administered 2017-10-08: 1 via ORAL
  Filled 2017-10-08: qty 1

## 2017-10-08 MED ORDER — OXYCODONE-ACETAMINOPHEN 5-325 MG PO TABS: 1.0000 | ORAL_TABLET | Freq: Four times a day (QID) | ORAL | 0 refills | Status: DC | PRN

## 2017-10-08 MED ORDER — DOUBLE ANTIBIOTIC 500-10000 UNIT/GM EX OINT
TOPICAL_OINTMENT | Freq: Once | CUTANEOUS | Status: DC
  Filled 2017-10-08: qty 28.4

## 2017-10-08 MED ORDER — TETANUS-DIPHTH-ACELL PERTUSSIS 5-2.5-18.5 LF-MCG/0.5 IM SUSP
0.5000 mL | Freq: Once | INTRAMUSCULAR | Status: AC
  Administered 2017-10-08: 0.5 mL via INTRAMUSCULAR
  Filled 2017-10-08: qty 0.5

## 2017-10-08 NOTE — ED Notes (Signed)
Pt's hand placed in ns with betadine and surgisoap for cleaning

## 2017-10-08 NOTE — ED Notes (Addendum)
FN: six puncture wounds from dog bite to right hand. Dressing applied out in lobby. Reported to [employer].

## 2017-10-08 NOTE — Discharge Instructions (Signed)
Follow-up with your regular doctor in 7 days to have the stitches removed.  If the area becomes red, swollen, or more painful please return to the emergency department.  Take the Augmentin as prescribed.  Percocet for pain as needed.  You may also take ibuprofen.  Keep the hand elevated and iced this will help with the swelling.  Return if any other problems.

## 2017-10-08 NOTE — ED Triage Notes (Signed)
Dog bit R hand approx 4pm. States dog had had its vaccines. States is his daughters dog. Gauze wrap over hand.

## 2017-10-08 NOTE — ED Provider Notes (Signed)
[facility] Emergency Department Provider Note  ____________________________________________   First MD Initiated Contact with Patient 10/08/17 1703     (approximate)  I have reviewed the triage vital signs and the nursing notes.   HISTORY  Chief Complaint Animal Bite    HPI Gorman Safi [place]. is a 62 y.o. male presents emergency department complaining of a dog bite to the right hand.  He states he went to his daughter's house and is never had a problem with her dog before.  Suddenly the dog bit his hand as they came in the door.  He states the dog is up-to-date on his vaccines.  The dog is a pit bull/mastiff mix.  He denies any loss of motion of the hand.  He states it is very sore.  He is unsure of his last tetanus shot.  Past Medical History:  Diagnosis Date  . Allergy   . GERD (gastroesophageal reflux disease)   . Hyperlipidemia   . Hypertension     Patient Active Problem List   Diagnosis Date Noted  . Genital herpes 01/23/2016  . Skin lesion of left arm 08/16/2015  . Nail abnormality 08/04/2015  . Short-term memory loss 08/04/2015  . Dysphagia 05/12/2015  . Fatigue 05/12/2015  . Vitamin D deficiency 05/12/2015  . Blood in the stool 05/12/2015  . GERD (gastroesophageal reflux disease) 01/29/2015  . OA (osteoarthritis) of knee 01/29/2015  . Fatty liver 01/28/2015  . Left ventricular hypertrophy 01/28/2015  . Morbid obesity ([place]) 01/28/2015  . Rhinitis, allergic 01/28/2015  . Elevated transaminase level 01/28/2015  . Hyperlipidemia 01/28/2015  . Benign hypertension 01/28/2015  . Benign prostatic hyperplasia with lower urinary tract symptoms 01/28/2015    Past Surgical History:  Procedure Laterality Date  . JOINT REPLACEMENT Bilateral   . NASAL SINUS SURGERY    . TOTAL KNEE ARTHROPLASTY      Prior to Admission medications   Medication Sig Start Date End Date Taking? Authorizing Provider  acyclovir (ZOVIRAX) 400 MG tablet Take 1  tablet (400 mg total) by mouth 2 (two) times daily. 08/29/17   Johnson, Megan P, DO  amoxicillin-clavulanate (AUGMENTIN) 875-125 MG tablet Take 1 tablet by mouth 2 (two) times daily. 10/08/17   Fisher, Linden Dolin, PA-C  Ascorbic Acid (VITAMIN C) 1000 MG tablet Take 1,000 mg by mouth daily.    [provider]  aspirin EC 81 MG tablet Take 81 mg by mouth daily.    [provider]  cholecalciferol (VITAMIN D) 1000 UNITS tablet Take 2,000 Units by mouth daily.     [provider]  finasteride (PROSCAR) 5 MG tablet Take 1 tablet (5 mg total) by mouth daily. 08/29/17   Johnson, Megan P, DO  fluticasone (FLONASE) 50 MCG/ACT nasal spray Place 1 spray into both nostrils 2 (two) times daily. 08/29/17   Johnson, Megan P, DO  lisinopril (PRINIVIL,ZESTRIL) 20 MG tablet Take 1 tablet (20 mg total) by mouth daily. 08/29/17   Johnson, Megan P, DO  loratadine (CLARITIN) 10 MG tablet Take 1 tablet (10 mg total) by mouth daily. 09/09/17   Johnson, Megan P, DO  montelukast (SINGULAIR) 10 MG tablet Take 1 tablet (10 mg total) by mouth at bedtime. 09/09/17   Park Liter P, DO  Multiple Vitamins-Minerals (MULTIVITAMIN WITH MINERALS) tablet Take 1 tablet by mouth daily.    [provider]  nystatin cream (MYCOSTATIN) APPLY CREAM TOPICALLY TWICE DAILY 08/29/17   Johnson, Megan P, DO  omeprazole (PRILOSEC) 40 MG capsule  Take 1 capsule (40 mg total) by mouth daily. 08/29/17   Johnson, Megan P, DO  oxyCODONE-acetaminophen (PERCOCET/ROXICET) 5-325 MG tablet Take 1 tablet by mouth every 6 (six) hours as needed for severe pain. 10/08/17   Fisher, Linden Dolin, PA-C  ranitidine (ZANTAC) 300 MG tablet Take 1 tablet (300 mg total) by mouth at bedtime. 08/29/17   Johnson, Megan P, DO  tamsulosin (FLOMAX) 0.4 MG CAPS capsule Take 1 capsule (0.4 mg total) by mouth 2 (two) times daily. 08/29/17   Johnson, Megan P, DO  triamcinolone ointment (KENALOG) 0.5 % APPLY  OINTMENT TOPICALLY TWICE DAILY 10/07/17   Park Liter  P, DO    Allergies Patient has no known allergies.  Family History  Problem Relation Age of Onset  . Cancer Mother        Breast  . Cancer Father        unsure, passed away before it was confirmed  . Cancer Daughter        pancreatic  . Diabetes Neg Hx   . Heart disease Neg Hx   . Hypertension Neg Hx   . Stroke Neg Hx   . COPD Neg Hx     Social History Social History   Tobacco Use  . Smoking status: Never Smoker  . Smokeless tobacco: Never Used  Substance Use Topics  . Alcohol use: Yes    Alcohol/week: 0.0 oz    Comment: occasionally, twice a year   . Drug use: No    Review of Systems  Constitutional: No fever/chills Eyes: No visual changes. ENT: No sore throat. Respiratory: Denies cough Genitourinary: Negative for dysuria. Musculoskeletal: Negative for back pain.  Positive for right hand pain Skin: Negative for rash.  Positive for dog bite to the right hand    ____________________________________________   PHYSICAL EXAM:  VITAL SIGNS: ED Triage Vitals  Enc Vitals Group     BP 10/08/17 1626 113/69     Pulse Rate 10/08/17 1626 88     Resp 10/08/17 1626 20     Temp 10/08/17 1626 98.8 F (37.1 C)     Temp Source 10/08/17 1626 Oral     SpO2 10/08/17 1626 98 %     Weight 10/08/17 1627 270 lb (122.5 kg)     Height 10/08/17 1627 5\' 7"  (1.702 m)     Head Circumference --      Peak Flow --      Pain Score 10/08/17 1627 6     Pain Loc --      Pain Edu? --      Excl. in [place]? --     Constitutional: Alert and oriented. Well appearing and in no acute distress. Eyes: Conjunctivae are normal.  Head: Atraumatic. Nose: No congestion/rhinnorhea. Mouth/Throat: Mucous membranes are moist.   Cardiovascular: Normal rate, regular rhythm.  Heart sounds are normal Respiratory: Normal respiratory effort.  No retractions, lungs clear to auscultation GU: deferred Musculoskeletal: FROM all extremities, warm and well perfused, positive for dog bite to the right  hand Neurologic:  Normal speech and language.  Skin:  Skin is warm, dry . No rash noted.  Positive for many puncture wounds on the hands, large tear along the thenar muscle of the right hand, the right fourth finger has a tear near the nail bed, no foreign bodies are noted Psychiatric: Mood and affect are normal. Speech and behavior are normal.  ____________________________________________   LABS (all labs ordered are listed, but only abnormal results are displayed)  Labs  Reviewed - No data to display ____________________________________________   ____________________________________________  RADIOLOGY  X-ray of the right hand does not show a fracture.  Radiologist as questionable tiny foreign bodies near the ulnar aspect.  ____________________________________________   PROCEDURES  Procedure(s) performed:   Marland KitchenMarland KitchenLaceration Repair Date/Time: 10/08/2017 7:00 PM Performed by: Versie Starks, PA-C Authorized by: Versie Starks, PA-C   Consent:    Consent obtained:  Verbal   Consent given by:  Patient   Risks discussed:  Infection, pain, retained foreign body, tendon damage, nerve damage, poor wound healing and vascular damage   Alternatives discussed:  No treatment Anesthesia (see MAR for exact dosages):    Anesthesia method:  Local infiltration   Local anesthetic:  Lidocaine 1% w/o epi Laceration details:    Location:  Hand   Hand location:  R palm   Length (cm):  3.5   Depth (mm):  2.5 Repair type:    Repair type:  Simple Pre-procedure details:    Preparation:  Patient was prepped and draped in usual sterile fashion and imaging obtained to evaluate for foreign bodies Exploration:    Hemostasis achieved with:  Direct pressure   Wound exploration: wound explored through full range of motion and entire depth of wound probed and visualized     Wound extent: no foreign bodies/material noted, no muscle damage noted, no nerve damage noted, no tendon damage noted, no  underlying fracture noted and no vascular damage noted     Contaminated: yes   Treatment:    Area cleansed with:  Betadine and saline   Amount of cleaning:  Extensive   Irrigation solution:  Sterile water and sterile saline   Irrigation method:  Pressure wash   Visualized foreign bodies/material removed: no   Skin repair:    Repair method:  Sutures   Suture size:  5-0   Number of sutures:  3 Approximation:    Approximation:  Loose   Vermilion border: well-aligned   Post-procedure details:    Dressing:  Antibiotic ointment and non-adherent dressing   Patient tolerance of procedure:  Tolerated well, no immediate complications .Marland KitchenLaceration Repair Date/Time: 10/08/2017 7:02 PM Performed by: Versie Starks, PA-C Authorized by: Versie Starks, PA-C   Consent:    Consent obtained:  Verbal   Consent given by:  Patient   Risks discussed:  Infection, pain and need for additional repair   Alternatives discussed:  No treatment Anesthesia (see MAR for exact dosages):    Anesthesia method:  None Laceration details:    Location:  Finger   Finger location:  R ring finger Repair type:    Repair type:  Simple Pre-procedure details:    Preparation:  Patient was prepped and draped in usual sterile fashion and imaging obtained to evaluate for foreign bodies Exploration:    Hemostasis achieved with:  Direct pressure   Wound exploration: wound explored through full range of motion and entire depth of wound probed and visualized     Wound extent: no areolar tissue violation noted, no fascia violation noted, no foreign bodies/material noted, no muscle damage noted, no nerve damage noted, no tendon damage noted, no underlying fracture noted and no vascular damage noted     Contaminated: no   Treatment:    Area cleansed with:  Betadine and saline   Amount of cleaning:  Standard   Irrigation solution:  Sterile saline   Irrigation method:  Pressure wash   Visualized foreign bodies/material  removed: no   Skin repair:  Repair method:  Tissue adhesive Approximation:    Approximation:  Close   Vermilion border: well-aligned   Post-procedure details:    Dressing:  Open (no dressing)   Patient tolerance of procedure:  Tolerated well, no immediate complications      ____________________________________________   INITIAL IMPRESSION / ASSESSMENT AND PLAN / ED COURSE  Pertinent labs & imaging results that were available during my care of the patient were reviewed by me and considered in my medical decision making (see chart for details).  Patient is 62 year old male presents emergency department complaining of dog bite to the right hand.  This is daughter's dog.  Dog is up-to-date on vaccines.  The dog bit him as he enter the home.  He is unsure of his last tetanus  Physical exam there is a large dog bite with multiple puncture wounds to the right hand most are on the volar surface.  No foreign body is visualized.  X-ray of the right hand shows that there may be some tiny foreign bodies in.   Laceration of the thenar muscle was repaired with 3 loose sutures.  Explained to the patient that we should not suture all of the puncture wounds due to infection.  The laceration on the right fourth finger was closed with glue.  He was cautioned to keep this area dryer this would reopened.  The patient states he agrees.  He will keep it as dry as possible.  He was given a prescription for Augmentin 875 mg twice daily for 7 days.  He is given a prescription for Percocet 5/325 #20 with no refill.  Explained to him that the narcotic can have an addictive quality.  He is to use ibuprofen and if his pain is relieved with that he should not use the Percocet.  Patient states he understands.  He is to follow-up with his regular doctor for a wound recheck.  He is to follow-up with his regular doctor to have sutures removed in 7 days.  If he is worsening with any signs of infection or increased pain he  should return to the emergency department.  Patient states he understands.  He was discharged in stable condition  As part of my medical decision making, I reviewed the following data within the [place] notes reviewed and incorporated, Radiograph reviewed x-ray of the right hand is negative for fracture or foreign body, Notes from prior ED visits and [place] Controlled Substance Database  ____________________________________________   FINAL CLINICAL IMPRESSION(S) / ED DIAGNOSES  Final diagnoses:  Dog bite of right hand, initial encounter      NEW MEDICATIONS STARTED DURING THIS VISIT:  Discharge Medication List as of 10/08/2017  6:13 PM    START taking these medications   Details  amoxicillin-clavulanate (AUGMENTIN) 875-125 MG tablet Take 1 tablet by mouth 2 (two) times daily., Starting Sat 10/08/2017, Print    oxyCODONE-acetaminophen (PERCOCET/ROXICET) 5-325 MG tablet Take 1 tablet by mouth every 6 (six) hours as needed for severe pain., Starting Sat 10/08/2017, Print         Note:  This document was prepared using Dragon voice recognition software and may include unintentional dictation errors.    Versie Starks, PA-C 10/08/17 Loretha Stapler    Arta Silence, MD 10/08/17 [phone]

## 2017-10-10 ENCOUNTER — Other Ambulatory Visit: Payer: Self-pay

## 2017-10-10 ENCOUNTER — Telehealth: Payer: Self-pay | Admitting: Family Medicine

## 2017-10-10 NOTE — Telephone Encounter (Signed)
That is a 3 month supply

## 2017-10-10 NOTE — Telephone Encounter (Signed)
Copied from [place] [phone]. Topic: Quick Communication - See Telephone Encounter >> Oct 10, 2017  3:05 PM Ivar Drape wrote: CRM for notification. See Telephone encounter for:  10/10/17. Alex w/[facility] [phone] stated that their pharmacy only distributes medication in three month supplys.  He said the triamcinolone ointment (KENALOG) 0.5 % prescription was sent over for 45 grams.  He wants to know if 45 grams will last for three months and if not, how do the PCP want it distributed.  It comes in 15 gram tubes.

## 2017-10-10 NOTE — Telephone Encounter (Signed)
Pharmacy notified.

## 2017-10-14 ENCOUNTER — Encounter: Payer: Self-pay | Admitting: Urology

## 2017-10-14 ENCOUNTER — Ambulatory Visit (INDEPENDENT_AMBULATORY_CARE_PROVIDER_SITE_OTHER): Payer: PPO | Admitting: Urology

## 2017-10-14 ENCOUNTER — Encounter: Payer: Self-pay | Admitting: Family Medicine

## 2017-10-14 ENCOUNTER — Ambulatory Visit (INDEPENDENT_AMBULATORY_CARE_PROVIDER_SITE_OTHER): Payer: PPO | Admitting: Family Medicine

## 2017-10-14 VITALS — BP 138/92 | HR 64 | Resp 16 | Ht 68.0 in | Wt 273.9 lb

## 2017-10-14 VITALS — BP 128/75 | HR 64 | Temp 97.6°F | Wt 272.8 lb

## 2017-10-14 DIAGNOSIS — R350 Frequency of micturition: Secondary | ICD-10-CM | POA: Diagnosis not present

## 2017-10-14 DIAGNOSIS — N401 Enlarged prostate with lower urinary tract symptoms: Secondary | ICD-10-CM

## 2017-10-14 DIAGNOSIS — Z125 Encounter for screening for malignant neoplasm of prostate: Secondary | ICD-10-CM | POA: Diagnosis not present

## 2017-10-14 DIAGNOSIS — R338 Other retention of urine: Secondary | ICD-10-CM

## 2017-10-14 DIAGNOSIS — S61411D Laceration without foreign body of right hand, subsequent encounter: Secondary | ICD-10-CM

## 2017-10-14 DIAGNOSIS — N138 Other obstructive and reflux uropathy: Secondary | ICD-10-CM | POA: Diagnosis not present

## 2017-10-14 LAB — URINALYSIS, COMPLETE
Bilirubin, UA: NEGATIVE
Glucose, UA: NEGATIVE
KETONES UA: NEGATIVE
NITRITE UA: NEGATIVE
PH UA: 5.5 (ref 5.0–7.5)
Protein, UA: NEGATIVE
RBC UA: NEGATIVE
SPEC GRAV UA: 1.015 (ref 1.005–1.030)
Urobilinogen, Ur: 0.2 mg/dL (ref 0.2–1.0)

## 2017-10-14 LAB — MICROSCOPIC EXAMINATION
Bacteria, UA: NONE SEEN
RBC MICROSCOPIC, UA: NONE SEEN /HPF (ref 0–?)

## 2017-10-14 LAB — BLADDER SCAN AMB NON-IMAGING: Scan Result: >494

## 2017-10-14 NOTE — Progress Notes (Signed)
   BP 128/75 (BP Location: Left Arm, Patient Position: Sitting, Cuff Size: Normal)   Pulse 64   Temp 97.6 F (36.4 C) (Oral)   Wt 272 lb 12.8 oz (123.7 kg)   SpO2 96%   BMI 42.73 kg/m    Subjective:    Patient ID: Derrick Ball., male    DOB: March 11, 1956, 62 y.o.   MRN: [id number]  HPI: Derrick Palmieri. is a 62 y.o. male  Chief Complaint  Patient presents with  . Suture / Staple Removal    Right hand, dog bite 7 days ago.   Pt here today for ER f/u for dog bite to right palm x 1 week. Hand x-rayed in ER with no evidence of foreign body. Wound was irrigated and sutured with 3 stitches. Td booster given and pt placed on 10 days of augmentin. Pain under fairly reasonable control with prn percocet. Denies fevers, chills, thick drainage, streaking. Has been performing home wound care with peroxide and neosporin.   Relevant past medical, surgical, family and social history reviewed and updated as indicated. Interim medical history since our last visit reviewed. Allergies and medications reviewed and updated.  Review of Systems  Per HPI unless specifically indicated above     Objective:    BP 128/75 (BP Location: Left Arm, Patient Position: Sitting, Cuff Size: Normal)   Pulse 64   Temp 97.6 F (36.4 C) (Oral)   Wt 272 lb 12.8 oz (123.7 kg)   SpO2 96%   BMI 42.73 kg/m   Wt Readings from Last 3 Encounters:  10/14/17 273 lb 14.4 oz (124.2 kg)  10/14/17 272 lb 12.8 oz (123.7 kg)  10/08/17 270 lb (122.5 kg)    Physical Exam  Constitutional: He is oriented to person, place, and time. He appears well-developed and well-nourished. No distress.  HENT:  Head: Atraumatic.  Eyes: Conjunctivae are normal.  Neck: Normal range of motion. Neck supple.  Cardiovascular: Normal rate.  Pulmonary/Chest: Effort normal. No respiratory distress.  Musculoskeletal: Normal range of motion.  Neurological: He is alert and oriented to person, place, and time.  Skin: Skin is warm and dry.    Wound on right palm healing well with no signs of infection. Wound edges well approximated  Psychiatric: He has a normal mood and affect. His behavior is normal.  Nursing note and vitals reviewed.   Results for orders placed or performed in visit on 09/09/17  Veritor Flu A/B Waived  Result Value Ref Range   Influenza A Negative Negative   Influenza B Negative Negative      Assessment & Plan:   Problem List Items Addressed This Visit    None    Visit Diagnoses    Laceration of right hand without foreign body, subsequent encounter    -  Primary   From dog bite. Sutures removed today without complication. Wound cleaned and dressed with neosporin and gauze. Finish abx and continue wound care at home       Follow up plan: Return for as scheduled.

## 2017-10-14 NOTE — Progress Notes (Signed)
10/14/2017 11:14 AM   Derrick Sandoval. 1955/11/21 [id number]  Referring provider: Valerie Roys, DO [place], [place] [address]  Chief Complaint  Patient presents with  . New Patient (Initial Visit)    prostate problem , UTI has been for frequent strong ordor     HPI: The patient is a 62-year-old gentleman with a past medical history of BPH on Flomax 0.8 mg and finasteride who presents today with urinary symptoms.  Of note, his PVR today was greater than 500 cc.  He is only been on the finasteride for approximately 1 month.  His biggest complaints now are urinary urgency and frequency.  He does not feel that his bladder is full.  He has a weak stream.  He also has hesitancy.  He has been seen by Dr. Jacqlyn Larsen in the past 2 perform what sounds like a DVIU for urethral stricture in 2015.  Again, the patient remains comfortable at this time despite his large bladder volumes.  He is significantly bothered by his urgency and frequency.   Patient also has a history of meningitis around the age of 39 that resulted in bowel and bladder incontinence.  The this lasted for a number of years but slowly resolved.  He has not had recent issues with this.  Of note, the patient had a recent dog bite is currently on Norco for this.  PSA was 0.2 in July 2018.   PMH: Past Medical History:  Diagnosis Date  . Allergy   . GERD (gastroesophageal reflux disease)   . Hyperlipidemia   . Hypertension   . Meningitis due to viruses 1968    Surgical History: Past Surgical History:  Procedure Laterality Date  . JOINT REPLACEMENT Bilateral   . NASAL SINUS SURGERY    . TOTAL KNEE ARTHROPLASTY      Home Medications:  Allergies as of 10/14/2017   No Known Allergies     Medication List        Accurate as of 10/14/17 11:14 AM. Always use your most recent med list.          acyclovir 400 MG tablet Commonly known as:  ZOVIRAX Take 1 tablet (400 mg total) by mouth 2 (two) times daily.     amoxicillin-clavulanate 875-125 MG tablet Commonly known as:  AUGMENTIN Take 1 tablet by mouth 2 (two) times daily.   aspirin EC 81 MG tablet Take 81 mg by mouth daily.   cholecalciferol 1000 units tablet Commonly known as:  VITAMIN D Take 2,000 Units by mouth daily.   finasteride 5 MG tablet Commonly known as:  PROSCAR Take 1 tablet (5 mg total) by mouth daily.   fluticasone 50 MCG/ACT nasal spray Commonly known as:  FLONASE Place 1 spray into both nostrils 2 (two) times daily.   lisinopril 20 MG tablet Commonly known as:  PRINIVIL,ZESTRIL Take 1 tablet (20 mg total) by mouth daily.   loratadine 10 MG tablet Commonly known as:  CLARITIN Take 1 tablet (10 mg total) by mouth daily.   montelukast 10 MG tablet Commonly known as:  SINGULAIR Take 1 tablet (10 mg total) by mouth at bedtime.   multivitamin with minerals tablet Take 1 tablet by mouth daily.   nystatin cream Commonly known as:  MYCOSTATIN APPLY CREAM TOPICALLY TWICE DAILY   omeprazole 40 MG capsule Commonly known as:  PRILOSEC Take 1 capsule (40 mg total) by mouth daily.   oxyCODONE-acetaminophen 5-325 MG tablet Commonly known as:  PERCOCET/ROXICET Take  1 tablet by mouth every 6 (six) hours as needed for severe pain.   ranitidine 300 MG tablet Commonly known as:  ZANTAC Take 1 tablet (300 mg total) by mouth at bedtime.   tamsulosin 0.4 MG Caps capsule Commonly known as:  FLOMAX Take 1 capsule (0.4 mg total) by mouth 2 (two) times daily.   vitamin C 1000 MG tablet Take 1,000 mg by mouth daily.       Allergies: No Known Allergies  Family History: Family History  Problem Relation Age of Onset  . Cancer Mother        Breast  . Cancer Father        unsure, passed away before it was confirmed  . Cancer Daughter        pancreatic  . Diabetes Neg Hx   . Heart disease Neg Hx   . Hypertension Neg Hx   . Stroke Neg Hx   . COPD Neg Hx     Social History:  reports that  has never smoked. he  has never used smokeless tobacco. He reports that he drinks alcohol. He reports that he does not use drugs.  ROS: UROLOGY Frequent Urination?: Yes Hard to postpone urination?: Yes Burning/pain with urination?: No Get up at night to urinate?: Yes Leakage of urine?: Yes Urine stream starts and stops?: Yes Trouble starting stream?: Yes Do you have to strain to urinate?: No Blood in urine?: No Urinary tract infection?: Yes Sexually transmitted disease?: Yes Injury to kidneys or bladder?: No Painful intercourse?: No Weak stream?: Yes Erection problems?: No Penile pain?: No  Gastrointestinal Nausea?: No Vomiting?: No Indigestion/heartburn?: Yes Diarrhea?: No Constipation?: Yes  Constitutional Fever: No Night sweats?: No Weight loss?: No Fatigue?: Yes  Skin Skin rash/lesions?: No Itching?: No  Eyes Blurred vision?: No Double vision?: No  Ears/Nose/Throat Sore throat?: No Sinus problems?: Yes  Hematologic/Lymphatic Swollen glands?: Yes Easy bruising?: No  Cardiovascular Leg swelling?: No Chest pain?: No  Respiratory Cough?: No Shortness of breath?: No  Endocrine Excessive thirst?: No  Musculoskeletal Back pain?: No Joint pain?: No  Neurological Headaches?: Yes Dizziness?: No  Psychologic Depression?: No Anxiety?: No  Physical Exam: BP (!) 138/92   Pulse 64   Resp 16   Ht 5\' 8"  (1.727 m)   Wt 273 lb 14.4 oz (124.2 kg)   SpO2 96%   BMI 41.65 kg/m   Constitutional:  Alert and oriented, No acute distress. HEENT: [place] AT, moist mucus membranes.  Trachea midline, no masses. Cardiovascular: No clubbing, cyanosis, or edema. Respiratory: Normal respiratory effort, no increased work of breathing. GI: Abdomen is soft, nontender, nondistended, no abdominal masses GU: No CVA tenderness.  Normal phallus.  Testicles descended equally bilaterally.  Benign.  DRE: 35 gm smooth benign. Skin: No rashes, bruises or suspicious lesions. Lymph: No cervical or  inguinal adenopathy. Neurologic: Grossly intact, no focal deficits, moving all 4 extremities. Psychiatric: Normal mood and affect.  Laboratory Data: Lab Results  Component Value Date   WBC 5.7 02/22/2017   HGB 16.0 02/22/2017   HCT 47.3 02/22/2017   MCV 91 02/22/2017   PLT 206 02/22/2017    Lab Results  Component Value Date   CREATININE 0.98 03/24/2017    No results found for: PSA  No results found for: TESTOSTERONE  Lab Results  Component Value Date   HGBA1C 5.8 (H) 08/13/2015    Urinalysis    Component Value Date/Time   COLORURINE YELLOW 05/02/2014 0921   APPEARANCEUR Clear 08/23/2017 1102  LABSPEC 1.015 05/02/2014 0921   PHURINE 7.0 05/02/2014 0921   GLUCOSEU Negative 08/23/2017 1102   GLUCOSEU NEGATIVE 05/02/2014 0921   HGBUR NEGATIVE 05/02/2014 0921   BILIRUBINUR Negative 08/23/2017 [place] 05/02/2014 0921   KETONESUR NEGATIVE 05/02/2014 0921   PROTEINUR Negative 08/23/2017 [place] 05/02/2014 0921   NITRITE Negative 08/23/2017 1102   NITRITE NEGATIVE 05/02/2014 0921   LEUKOCYTESUR Negative 08/23/2017 [place] 05/02/2014 0921     Assessment & Plan:    1.  BPH with urinary retention I discussed the patient that the urinary retention is likely multifactorial secondary to BPH, possible recurrent urethral stricture, and possible partial neurogenic bladder as he does not feel his full bladder at this time.  I did strongly encourage him to undergo catheter placement at this time.  He is refusing this currently.  We did discuss the risks of having such high bladder volumes.  He does understand that it would be against medical advice to not have a catheter placed.  He is agreeable to proceeding against medical advice.  We will continue his Flomax and finasteride at this time.  His finasteride has only been in his system for 1 month. We did discuss that it could take up to 6 month to have its full effect. I am  also concerned that he may have recurrent stricture disease.  We will have him follow-up in the near future for repeat cystoscopy to rule this out.  He may be a candidate for urodynamic studies at some point to further elucidate what is going on with his  Urination.  2.  Prostate cancer screening Up-to-date  Return for cystoscopy.  Nickie Retort, MD  [facility] [address] [place], [place] [address] [phone]

## 2017-10-16 NOTE — Patient Instructions (Signed)
Follow up as needed

## 2017-10-21 ENCOUNTER — Ambulatory Visit (INDEPENDENT_AMBULATORY_CARE_PROVIDER_SITE_OTHER): Payer: PPO | Admitting: Family Medicine

## 2017-10-21 ENCOUNTER — Encounter: Payer: Self-pay | Admitting: Family Medicine

## 2017-10-21 NOTE — Progress Notes (Signed)
BP (!) 138/94 (BP Location: Left Arm, Patient Position: Sitting, Cuff Size: Large)   Pulse 62   Temp 97.7 F (36.5 C)   Wt 273 lb 1 oz (123.9 kg)   SpO2 97%   BMI 41.52 kg/m    Subjective:    Patient ID: Derrick Ball., male    DOB: 1955-12-06, 62 y.o.   MRN: [id number]  HPI: Derrick Owczarzak. is a 62 y.o. male  Chief Complaint  Patient presents with  . Weight Check   WEIGHT GAIN-Has lost another 3 lbs, Now going to the gym 3-4x a week. Staying about an hour an half. Has been doing about 2 miles on 2.46mph on the treadmill and has been doing some weight machine exercises and belly crunches.  Still not eating anything white (no pasta, no rice or bread). He does note that he celebrated 4 birthday, so didn't eat quite what he was supposed to eat. Cut out the milk down to 1-2 glasses. Still doing lean cuisine for lunch. Having a bagel and boiled eggs for breakfast. Still doing about 4oz of meat- a bit more with chicken. Only baked or boiled or grilled.  Duration: chronic- gained 30lbs in last 3 years Previous attempts at weight loss: yes Complications of obesity: HTN, GERD, OA Peak weight: 280 Weight loss goal: to be healthy Weight loss to date:7lbs  Relevant past medical, surgical, family and social history reviewed and updated as indicated. Interim medical history since our last visit reviewed. Allergies and medications reviewed and updated.  Review of Systems  Constitutional: Negative.   Respiratory: Negative.   Cardiovascular: Negative.   Psychiatric/Behavioral: Negative.     Per HPI unless specifically indicated above     Objective:    BP (!) 138/94 (BP Location: Left Arm, Patient Position: Sitting, Cuff Size: Large)   Pulse 62   Temp 97.7 F (36.5 C)   Wt 273 lb 1 oz (123.9 kg)   SpO2 97%   BMI 41.52 kg/m   Wt Readings from Last 3 Encounters:  10/21/17 273 lb 1 oz (123.9 kg)  10/14/17 273 lb 14.4 oz (124.2 kg)  10/14/17 272 lb 12.8 oz (123.7 kg)      Physical Exam  Constitutional: He is oriented to person, place, and time. He appears well-developed and well-nourished. No distress.  HENT:  Head: Normocephalic and atraumatic.  Right Ear: Hearing normal.  Left Ear: Hearing normal.  Nose: Nose normal.  Eyes: Conjunctivae and lids are normal. Right eye exhibits no discharge. Left eye exhibits no discharge. No scleral icterus.  Cardiovascular: Normal rate, regular rhythm, normal heart sounds and intact distal pulses. Exam reveals no gallop and no friction rub.  No murmur heard. Pulmonary/Chest: Effort normal and breath sounds normal. No respiratory distress. He has no wheezes. He has no rales. He exhibits no tenderness.  Musculoskeletal: Normal range of motion.  Neurological: He is alert and oriented to person, place, and time.  Skin: Skin is warm, dry and intact. No rash noted. He is not diaphoretic. No erythema. No pallor.  Psychiatric: He has a normal mood and affect. His speech is normal and behavior is normal. Judgment and thought content normal. Cognition and memory are normal.  Nursing note and vitals reviewed.   Results for orders placed or performed in visit on 10/14/17  Microscopic Examination  Result Value Ref Range   WBC, UA 0-5 0 - 5 /hpf   RBC, UA None seen 0 - 2 /hpf   Epithelial  Cells (non renal) 0-10 0 - 10 /hpf   Bacteria, UA None seen None seen/Few  Urinalysis, Complete  Result Value Ref Range   Specific Gravity, UA 1.015 1.005 - 1.030   pH, UA 5.5 5.0 - 7.5   Color, UA Yellow Yellow   Appearance Ur Clear Clear   Leukocytes, UA 1+ (A) Negative   Protein, UA Negative Negative/Trace   Glucose, UA Negative Negative   Ketones, UA Negative Negative   RBC, UA Negative Negative   Bilirubin, UA Negative Negative   Urobilinogen, Ur 0.2 0.2 - 1.0 mg/dL   Nitrite, UA Negative Negative   Microscopic Examination See below:   Bladder Scan (Post Void Residual) in office  Result Value Ref Range   Scan Result >494        Assessment & Plan:   Problem List Items Addressed This Visit      Other   Morbid obesity ([place]) - Primary    Has lost another 3 lbs. Continue exercise and continuing to decrease intake. Will get him back into see the bariatric center. Call with any concerns.           Follow up plan: Return July, for physical/wellness.

## 2017-10-21 NOTE — Assessment & Plan Note (Signed)
Has lost another 3 lbs. Continue exercise and continuing to decrease intake. Will get him back into see the bariatric center. Call with any concerns.

## 2017-10-27 ENCOUNTER — Encounter: Payer: Self-pay | Admitting: Urology

## 2017-10-27 ENCOUNTER — Ambulatory Visit: Payer: PPO | Admitting: Urology

## 2017-10-27 VITALS — BP 142/86 | HR 65 | Ht 68.0 in | Wt 270.7 lb

## 2017-10-27 DIAGNOSIS — N401 Enlarged prostate with lower urinary tract symptoms: Secondary | ICD-10-CM

## 2017-10-27 DIAGNOSIS — R338 Other retention of urine: Secondary | ICD-10-CM

## 2017-10-27 LAB — URINALYSIS, COMPLETE
BILIRUBIN UA: NEGATIVE
GLUCOSE, UA: NEGATIVE
KETONES UA: NEGATIVE
Leukocytes, UA: NEGATIVE
Nitrite, UA: NEGATIVE
Protein, UA: NEGATIVE
RBC, UA: NEGATIVE
Specific Gravity, UA: 1.01 (ref 1.005–1.030)
UUROB: 0.2 mg/dL (ref 0.2–1.0)
pH, UA: 6 (ref 5.0–7.5)

## 2017-10-27 MED ORDER — CIPROFLOXACIN HCL 500 MG PO TABS
500.0000 mg | ORAL_TABLET | Freq: Once | ORAL | Status: AC
  Administered 2017-10-27: 500 mg via ORAL

## 2017-10-27 MED ORDER — LIDOCAINE HCL 2 % EX GEL
1.0000 "application " | Freq: Once | CUTANEOUS | Status: AC
  Administered 2017-10-27: 1 via URETHRAL

## 2017-10-27 NOTE — Progress Notes (Signed)
   10/27/17  CC:  Chief Complaint  Patient presents with  . Cysto    HPI: The patient is a 62 year old gentleman with a past medical history of BPH on Flomax 0.8 mg and finasteride who presents today with urinary symptoms.  Of note, his PVR at last visitwas greater than 500 cc.  He has only been on the finasteride for approximately 1 month.  His biggest complaints now are urinary urgency and frequency.  He does not feel that his bladder is full.  He has a weak stream.  He also has hesitancy.  He has been seen by Dr. Jacqlyn Larsen in the past who performed what sounds like a DVIU for urethral stricture in 2015.  He also has a history of a TURP. Again, the patient remains comfortable at this time despite his large bladder volumes.  He is significantly bothered by his urgency and frequency.   Patient also has a history of meningitis around the age of 75 that resulted in bowel and bladder incontinence.  The this lasted for a number of years but slowly resolved.  He has not had recent issues with this.  PSA was 0.2 in July 2018.  At his last visit, is recommended the patient undergo Foley catheterization for his large volume PVR.  He adamantly refused.  He returns today for cystoscopy to rule out urethral stricture.  If this is normal, he will need urodynamic studies.  There were no vitals taken for this visit. NED. A&Ox3.   No respiratory distress   Abd soft, NT, ND Normal phallus with bilateral descended testicles  Cystoscopy Procedure Note  Patient identification was confirmed, informed consent was obtained, and patient was prepped using Betadine solution.  Lidocaine jelly was administered per urethral meatus.    Preoperative abx where received prior to procedure.     Pre-Procedure: - Inspection reveals a normal caliber ureteral meatus.  Procedure: The flexible cystoscope was introduced without difficulty - No urethral strictures/lesions are present. - Large TURP defect noted.  Prostate  fossa is wide open.  No visual obstruction. - Normal bladder neck - Bilateral ureteral orifices identified - Bladder mucosa  reveals no ulcers, tumors, or lesions. Bladder visibly over distended upon entering. - No bladder stones - No trabeculation  Retroflexion shows no intravesical lobe. Large TURP defect again noted   Post-Procedure: - Patient tolerated the procedure well  Assessment/ Plan:  1. BPH with urinary retention I discussed with the patient that his urinary symptoms and urinary retention are correctly as his prosthetic fossa is wide open.  More than likely related to his bladder not functioning I do at this time think would be wise to the patient to undergo urodynamic studies to assess further.  This will also help [place] ensure safe bladder pressures. He will continue his Flomax and finasteride for now.  I again advised the patient that I would recommend a catheter CIC at this time to decompress his bladder in hopes of helping a return to normal function.  The patient again refuses this.  He understands the risk of worsening of his retention, UTIs, and renal failure.

## 2017-11-11 ENCOUNTER — Ambulatory Visit: Payer: PPO

## 2017-12-09 ENCOUNTER — Telehealth: Payer: Self-pay | Admitting: Family Medicine

## 2017-12-09 NOTE — Telephone Encounter (Signed)
Copied from [place] [id number]. Topic: Quick Communication - Rx Refill/Question >> Dec 09, 2017  1:31 PM Bea Graff, NT wrote: Medication: fluticasone (FLONASE) 50 MCG/ACT nasal spray Has the patient contacted their pharmacy? Yes.   (Agent: If no, request that the patient contact the pharmacy for the refill.) Preferred Pharmacy (with phone number or street name): [facility] (N), [place] - [address] [phone] (Phone) [phone] (Fax)  Pt would like to rx changed from [facility] to [facility] for a 90 day rx.   Agent: Please be advised that RX refills may take up to 3 business days. We ask that you follow-up with your pharmacy.

## 2017-12-10 NOTE — Telephone Encounter (Signed)
Flonase filled x 90 days to new local pharmacy (switch from mail order)  Last OV:09/09/17 Last filled:08/29/17 12 refills PCP: [place]: [facility] [address] (N), [place] [phone] (Phone) [phone] (Fax)

## 2017-12-12 MED ORDER — FLUTICASONE PROPIONATE 50 MCG/ACT NA SUSP: 1.0000 | Freq: Two times a day (BID) | NASAL | 12 refills | Status: DC

## 2017-12-22 ENCOUNTER — Telehealth: Payer: Self-pay | Admitting: Family Medicine

## 2017-12-22 NOTE — Telephone Encounter (Signed)
Copied from [place] [id number]. Topic: Quick Communication - Rx Refill/Question >> Dec 22, 2017  4:44 PM Bea Graff, NT wrote: Medication: omeprazole (PRILOSEC) 40 MG capsule Has the patient contacted their pharmacy? Yes.   (Agent: If no, request that the patient contact the pharmacy for the refill.) Preferred Pharmacy (with phone number or street name): [facility] (N), [place] - [place] [phone] (Phone) [phone] (Fax)     Agent: Please be advised that RX refills may take up to 3 business days. We ask that you follow-up with your pharmacy.

## 2017-12-23 MED ORDER — OMEPRAZOLE 40 MG PO CPDR: 40.0000 mg | DELAYED_RELEASE_CAPSULE | Freq: Every day | ORAL | 0 refills | Status: DC

## 2017-12-28 ENCOUNTER — Telehealth: Payer: Self-pay | Admitting: Family Medicine

## 2017-12-28 MED ORDER — FINASTERIDE 5 MG PO TABS: 5.0000 mg | ORAL_TABLET | Freq: Every day | ORAL | 2 refills | Status: DC

## 2017-12-28 MED ORDER — LISINOPRIL 20 MG PO TABS: 20.0000 mg | ORAL_TABLET | Freq: Every day | ORAL | 0 refills | Status: DC

## 2017-12-28 NOTE — Telephone Encounter (Signed)
Copied from [place] [id number]. Topic: General - Other >> Dec 28, 2017  4:25 PM Oneta Rack wrote:  Relation to GU:YQIH Call back number: [phone] [place] (N), [place] - [address] [address] [phone] (Phone) [phone] (Fax)  Reason for call:  Patient requesting the following medication finasteride (PROSCAR) 5 MG tablet and lisinopril (PRINIVIL,ZESTRIL) 20 MG tablet, pharmacy changed, patient informed please allow 48 to 72 hour turn around

## 2017-12-28 NOTE — Telephone Encounter (Signed)
Medications sent to new pharmacy as requested

## 2018-01-18 ENCOUNTER — Encounter: Payer: Self-pay | Admitting: Unknown Physician Specialty

## 2018-01-18 ENCOUNTER — Ambulatory Visit
Admission: RE | Admit: 2018-01-18 | Discharge: 2018-01-18 | Disposition: A | Discharge status: Home / Self Care | Payer: PPO | Source: Ambulatory Visit | Attending: Unknown Physician Specialty | Admitting: Unknown Physician Specialty

## 2018-01-18 ENCOUNTER — Ambulatory Visit (INDEPENDENT_AMBULATORY_CARE_PROVIDER_SITE_OTHER): Payer: PPO | Admitting: Unknown Physician Specialty

## 2018-01-18 VITALS — BP 137/88 | HR 69 | Temp 98.1°F | Ht 68.0 in | Wt 273.0 lb

## 2018-01-18 DIAGNOSIS — R0609 Other forms of dyspnea: Secondary | ICD-10-CM | POA: Insufficient documentation

## 2018-01-18 DIAGNOSIS — R071 Chest pain on breathing: Secondary | ICD-10-CM

## 2018-01-18 DIAGNOSIS — R0602 Shortness of breath: Secondary | ICD-10-CM

## 2018-01-18 DIAGNOSIS — R0989 Other specified symptoms and signs involving the circulatory and respiratory systems: Secondary | ICD-10-CM | POA: Diagnosis not present

## 2018-01-18 MED ORDER — PREDNISONE 20 MG PO TABS: 40.0000 mg | ORAL_TABLET | Freq: Every day | ORAL | 0 refills | Status: DC

## 2018-01-18 NOTE — Progress Notes (Signed)
BP 137/88   Pulse 69   Temp 98.1 F (36.7 C) (Oral)   Ht 5\' 8"  (1.727 m)   Wt 273 lb (123.8 kg)   SpO2 96%   BMI 41.51 kg/m    Subjective:    Patient ID: Derrick Ball., male    DOB: 1956/08/16, 62 y.o.   MRN: [id number]  HPI: Derrick Cardell. is a 62 y.o. male  Chief Complaint  Patient presents with  . Chest Congestion    pt states he has had chest congestion for about 3 weeks, states he has tried his albuterol inhaler and mucinex with not much relief   Cough  This is a new problem. Episode onset: 3 weeks. The problem has been gradually worsening. The cough is non-productive. Associated symptoms include chest pain, shortness of breath and wheezing. Pertinent negatives include no chills, ear congestion, ear pain, fever, headaches, heartburn, hemoptysis, myalgias, nasal congestion, postnasal drip, rash, rhinorrhea, sore throat, sweats or weight loss.   Feet and ankle swelling comes and goes, worse when "sits around a whole lot"  Relevant past medical, surgical, family and social history reviewed and updated as indicated. Interim medical history since our last visit reviewed. Allergies and medications reviewed and updated.  Review of Systems  Constitutional: Negative for chills, fever and weight loss.  HENT: Negative for ear pain, postnasal drip, rhinorrhea and sore throat.   Respiratory: Positive for cough, shortness of breath and wheezing. Negative for hemoptysis.   Cardiovascular: Positive for chest pain.  Gastrointestinal: Negative for heartburn.  Musculoskeletal: Negative for myalgias.  Skin: Negative for rash.  Neurological: Negative for headaches.    Per HPI unless specifically indicated above     Objective:    BP 137/88   Pulse 69   Temp 98.1 F (36.7 C) (Oral)   Ht 5\' 8"  (1.727 m)   Wt 273 lb (123.8 kg)   SpO2 96%   BMI 41.51 kg/m   Wt Readings from Last 3 Encounters:  01/18/18 273 lb (123.8 kg)  10/27/17 270 lb 11.2 oz (122.8 kg)  10/21/17  273 lb 1 oz (123.9 kg)    Physical Exam  Constitutional: He is oriented to person, place, and time. He appears well-developed and well-nourished. No distress.  HENT:  Head: Normocephalic and atraumatic.  Eyes: Conjunctivae and lids are normal. Right eye exhibits no discharge. Left eye exhibits no discharge. No scleral icterus.  Neck: Normal range of motion. Neck supple. No JVD present. Carotid bruit is not present.  Cardiovascular: Normal rate, regular rhythm and normal heart sounds.  Pulmonary/Chest: Effort normal and breath sounds normal. No respiratory distress.  Abdominal: Normal appearance. There is no splenomegaly or hepatomegaly.  Musculoskeletal: Normal range of motion.  Neurological: He is alert and oriented to person, place, and time.  Skin: Skin is warm, dry and intact. No rash noted. No pallor.  Psychiatric: He has a normal mood and affect. His behavior is normal. Judgment and thought content normal.   EKG normal without STTW changes  Results for orders placed or performed in visit on 10/27/17  Urinalysis, Complete  Result Value Ref Range   Specific Gravity, UA 1.010 1.005 - 1.030   pH, UA 6.0 5.0 - 7.5   Color, UA Yellow Yellow   Appearance Ur Clear Clear   Leukocytes, UA Negative Negative   Protein, UA Negative Negative/Trace   Glucose, UA Negative Negative   Ketones, UA Negative Negative   RBC, UA Negative Negative   Bilirubin, UA  Negative Negative   Urobilinogen, Ur 0.2 0.2 - 1.0 mg/dL   Nitrite, UA Negative Negative      Assessment & Plan:   Problem List Items Addressed This Visit    None    Visit Diagnoses    SOB (shortness of breath)    -  Primary   Check CBC.  Lungs CTA.  Order chest x-ray   Relevant Orders   Comprehensive metabolic panel   Ambulatory referral to Cardiology   DG Chest 2 View   Chest pain on breathing       Etiology unclear.  Non-reproducible pain on palpation.  Normal EKG.  Will get chest x-ray   Relevant Orders   CBC with  Differential/Platelet   Ambulatory referral to Cardiology   EKG 12-Lead (Completed)   DG Chest 2 View   DOE (dyspnea on exertion)       ? RAD.  No wheezing at this time.  Has inhaler at home.  Prednesone burst at 40 mg dailhy for 5 days   Relevant Orders   DG Chest 2 View       Follow up plan: Return if symptoms worsen or fail to improve.

## 2018-01-19 LAB — COMPREHENSIVE METABOLIC PANEL
A/G RATIO: 1.7 (ref 1.2–2.2)
ALBUMIN: 4.5 g/dL (ref 3.6–4.8)
ALT: 33 IU/L (ref 0–44)
AST: 30 IU/L (ref 0–40)
Alkaline Phosphatase: 66 IU/L (ref 39–117)
BILIRUBIN TOTAL: 0.3 mg/dL (ref 0.0–1.2)
BUN / CREAT RATIO: 19 (ref 10–24)
BUN: 17 mg/dL (ref 8–27)
CO2: 19 mmol/L — AB (ref 20–29)
CREATININE: 0.88 mg/dL (ref 0.76–1.27)
Calcium: 9.4 mg/dL (ref 8.6–10.2)
Chloride: 104 mmol/L (ref 96–106)
GFR calc Af Amer: 108 mL/min/{1.73_m2} (ref >59)
GFR calc non Af Amer: 93 mL/min/{1.73_m2} (ref >59)
GLOBULIN, TOTAL: 2.6 g/dL (ref 1.5–4.5)
Glucose: 80 mg/dL (ref 65–99)
POTASSIUM: 4.3 mmol/L (ref 3.5–5.2)
SODIUM: 141 mmol/L (ref 134–144)
Total Protein: 7.1 g/dL (ref 6.0–8.5)

## 2018-01-19 LAB — CBC WITH DIFFERENTIAL/PLATELET
BASOS: 0 %
Basophils Absolute: 0 10*3/uL (ref 0.0–0.2)
EOS (ABSOLUTE): 0.2 10*3/uL (ref 0.0–0.4)
EOS: 3 %
HEMATOCRIT: 45.6 % (ref 37.5–51.0)
HEMOGLOBIN: 15.6 g/dL (ref 13.0–17.7)
Immature Grans (Abs): 0 10*3/uL (ref 0.0–0.1)
Immature Granulocytes: 0 %
LYMPHS ABS: 2.8 10*3/uL (ref 0.7–3.1)
Lymphs: 35 %
MCH: 30.6 pg (ref 26.6–33.0)
MCHC: 34.2 g/dL (ref 31.5–35.7)
MCV: 89 fL (ref 79–97)
MONOCYTES: 11 %
MONOS ABS: 0.9 10*3/uL (ref 0.1–0.9)
NEUTROS ABS: 4 10*3/uL (ref 1.4–7.0)
Neutrophils: 51 %
Platelets: 218 10*3/uL (ref 150–450)
RBC: 5.1 x10E6/uL (ref 4.14–5.80)
RDW: 14.3 % (ref 12.3–15.4)
WBC: 7.9 10*3/uL (ref 3.4–10.8)

## 2018-01-19 NOTE — Progress Notes (Signed)
Notified pt by mychart

## 2018-01-23 ENCOUNTER — Telehealth: Payer: Self-pay | Admitting: Family Medicine

## 2018-01-23 NOTE — Telephone Encounter (Signed)
Message left on patient's VM (DPR reviewed) that his results were on MyChart but if he had any questions or hasn't received them, to give [place] a call back.

## 2018-01-23 NOTE — Telephone Encounter (Signed)
Copied from [place] [id number]. Topic: Quick Communication - Lab Results >> Jan 23, 2018  8:27 AM Marja Kays F wrote: Pt is looking for lab results and chest xray from 01/18/18  [phone]

## 2018-01-23 NOTE — Telephone Encounter (Signed)
error 

## 2018-01-23 NOTE — Telephone Encounter (Signed)
It appears that Malachy Mood (who saw him at that time) gave him his results by Deloris Ping- can you please check that he is aware of the results/give them to him?

## 2018-02-01 ENCOUNTER — Encounter: Payer: Self-pay | Admitting: Family Medicine

## 2018-02-14 ENCOUNTER — Encounter: Payer: Self-pay | Admitting: Family Medicine

## 2018-02-14 ENCOUNTER — Ambulatory Visit (INDEPENDENT_AMBULATORY_CARE_PROVIDER_SITE_OTHER): Payer: PPO | Admitting: Family Medicine

## 2018-02-14 VITALS — BP 122/85 | HR 80 | Temp 99.2°F | Wt 274.0 lb

## 2018-02-14 DIAGNOSIS — J019 Acute sinusitis, unspecified: Secondary | ICD-10-CM

## 2018-02-14 MED ORDER — AZITHROMYCIN 250 MG PO TABS: ORAL_TABLET | ORAL | 0 refills | Status: DC

## 2018-02-14 NOTE — Progress Notes (Signed)
BP 122/85   Pulse 80   Temp 99.2 F (37.3 C) (Oral)   Wt 274 lb (124.3 kg)   SpO2 95%   BMI 41.66 kg/m    Subjective:    Patient ID: Derrick Ball., male    DOB: [birth year], 62 y.o.   MRN: [id number]  HPI: Derrick Mehrer. is a 62 y.o. male  Chief Complaint  Patient presents with  . URI    Nasal/chest congestion, headache. Wife saw Malachy Mood last week   Patient with marked sinus congestion drainage facial pressure some low-grade fever feeling bad generalized achiness.  Has been exposed to grandson with a respiratory infection wife has had sinus infection and COPD.  Patient also with COPD and is on inhalers. Has tried some over-the-counter medications with no real relief. Relevant past medical, surgical, family and social history reviewed and updated as indicated. Interim medical history since our last visit reviewed. Allergies and medications reviewed and updated.  Review of Systems  Constitutional: Positive for chills, diaphoresis, fatigue and fever.  HENT: Positive for congestion, postnasal drip, rhinorrhea, sinus pressure, sinus pain, sneezing and sore throat.   Respiratory: Positive for cough. Negative for shortness of breath.   Cardiovascular: Negative.     Per HPI unless specifically indicated above     Objective:    BP 122/85   Pulse 80   Temp 99.2 F (37.3 C) (Oral)   Wt 274 lb (124.3 kg)   SpO2 95%   BMI 41.66 kg/m   Wt Readings from Last 3 Encounters:  02/14/18 274 lb (124.3 kg)  01/18/18 273 lb (123.8 kg)  10/27/17 270 lb 11.2 oz (122.8 kg)    Physical Exam  Constitutional: He is oriented to person, place, and time. He appears well-developed and well-nourished.  HENT:  Head: Normocephalic and atraumatic.  Right Ear: External ear normal.  Left Ear: External ear normal.  Mouth/Throat: Oropharyngeal exudate present.  Eyes: Conjunctivae and EOM are normal.  Neck: Normal range of motion.  Cardiovascular: Normal rate, regular rhythm and normal  heart sounds.  Pulmonary/Chest: Effort normal and breath sounds normal.  Musculoskeletal: Normal range of motion.  Neurological: He is alert and oriented to person, place, and time.  Skin: No erythema.  Psychiatric: He has a normal mood and affect. His behavior is normal. Judgment and thought content normal.    Results for orders placed or performed in visit on 01/18/18  CBC with Differential/Platelet  Result Value Ref Range   WBC 7.9 3.4 - 10.8 x10E3/uL   RBC 5.10 4.14 - 5.80 x10E6/uL   Hemoglobin 15.6 13.0 - 17.7 g/dL   Hematocrit 45.6 37.5 - 51.0 %   MCV 89 79 - 97 fL   MCH 30.6 26.6 - 33.0 pg   MCHC 34.2 31.5 - 35.7 g/dL   RDW 14.3 12.3 - 15.4 %   Platelets 218 150 - 450 x10E3/uL   Neutrophils 51 Not Estab. %   Lymphs 35 Not Estab. %   Monocytes 11 Not Estab. %   Eos 3 Not Estab. %   Basos 0 Not Estab. %   Neutrophils Absolute 4.0 1.4 - 7.0 x10E3/uL   Lymphocytes Absolute 2.8 0.7 - 3.1 x10E3/uL   Monocytes Absolute 0.9 0.1 - 0.9 x10E3/uL   EOS (ABSOLUTE) 0.2 0.0 - 0.4 x10E3/uL   Basophils Absolute 0.0 0.0 - 0.2 x10E3/uL   Immature Granulocytes 0 Not Estab. %   Immature Grans (Abs) 0.0 0.0 - 0.1 x10E3/uL  Comprehensive metabolic panel  Result Value Ref Range   Glucose 80 65 - 99 mg/dL   BUN 17 8 - 27 mg/dL   Creatinine, Ser 0.88 0.76 - 1.27 mg/dL   GFR calc non Af Amer 93 >59 mL/min/1.73   GFR calc Af Amer 108 >59 mL/min/1.73   BUN/Creatinine Ratio 19 10 - 24   Sodium 141 134 - 144 mmol/L   Potassium 4.3 3.5 - 5.2 mmol/L   Chloride 104 96 - 106 mmol/L   CO2 19 (L) 20 - 29 mmol/L   Calcium 9.4 8.6 - 10.2 mg/dL   Total Protein 7.1 6.0 - 8.5 g/dL   Albumin 4.5 3.6 - 4.8 g/dL   Globulin, Total 2.6 1.5 - 4.5 g/dL   Albumin/Globulin Ratio 1.7 1.2 - 2.2   Bilirubin Total 0.3 0.0 - 1.2 mg/dL   Alkaline Phosphatase 66 39 - 117 IU/L   AST 30 0 - 40 IU/L   ALT 33 0 - 44 IU/L      Assessment & Plan:   Problem List Items Addressed This Visit    None    Visit  Diagnoses    Acute sinusitis, recurrence not specified, unspecified location    -  Primary   Relevant Medications   azithromycin (ZITHROMAX) 250 MG tablet    Discussed sinusitis care and treatment use of over-the-counter medications use of Z-Pak his wife is responded to his Z-Pak well.  Follow up plan: Return if symptoms worsen or fail to improve, for As scheduled.

## 2018-02-25 ENCOUNTER — Other Ambulatory Visit: Payer: Self-pay | Admitting: Family Medicine

## 2018-02-27 ENCOUNTER — Encounter: Payer: PPO | Admitting: Family Medicine

## 2018-02-28 ENCOUNTER — Other Ambulatory Visit: Payer: Self-pay

## 2018-02-28 MED ORDER — LISINOPRIL 20 MG PO TABS
20.0000 mg | ORAL_TABLET | Freq: Every day | ORAL | 1 refills | Status: DC
Start: 2018-02-28 — End: 2018-04-21

## 2018-03-07 ENCOUNTER — Encounter: Payer: PPO | Admitting: Family Medicine

## 2018-03-22 ENCOUNTER — Telehealth: Payer: Self-pay | Admitting: Family Medicine

## 2018-03-22 NOTE — Telephone Encounter (Signed)
Copied from [place] [id number]. Topic: Quick Communication - Rx Refill/Question >> Mar 22, 2018  9:44 AM Waylan Rocher, Lumin L wrote: Medication: montelukast (SINGULAIR) 10 MG tablet  Has the patient contacted their pharmacy? Yes.   (Agent: If no, request that the patient contact the pharmacy for the refill.) (Agent: If yes, when and what did the pharmacy advise?)  Preferred Pharmacy (with phone number or street name): [facility] (N), [place] - [place] ([place]) [place] [address] Phone: [phone] Fax: [phone]  Agent: Please be advised that RX refills may take up to 3 business days. We ask that you follow-up with your pharmacy.

## 2018-03-22 NOTE — Telephone Encounter (Signed)
[facility] called and it was verified the prescription sent on 09/09/17 is on hold, so it will be released and filled.

## 2018-04-07 DIAGNOSIS — Z79899 Other long term (current) drug therapy: Secondary | ICD-10-CM | POA: Diagnosis not present

## 2018-04-07 DIAGNOSIS — M1990 Unspecified osteoarthritis, unspecified site: Secondary | ICD-10-CM | POA: Diagnosis not present

## 2018-04-07 DIAGNOSIS — R339 Retention of urine, unspecified: Secondary | ICD-10-CM | POA: Diagnosis not present

## 2018-04-07 DIAGNOSIS — K5900 Constipation, unspecified: Secondary | ICD-10-CM | POA: Diagnosis not present

## 2018-04-07 DIAGNOSIS — Z6841 Body Mass Index (BMI) 40.0 and over, adult: Secondary | ICD-10-CM | POA: Diagnosis not present

## 2018-04-07 DIAGNOSIS — R3915 Urgency of urination: Secondary | ICD-10-CM | POA: Diagnosis not present

## 2018-04-07 DIAGNOSIS — N401 Enlarged prostate with lower urinary tract symptoms: Secondary | ICD-10-CM | POA: Diagnosis not present

## 2018-04-07 DIAGNOSIS — M549 Dorsalgia, unspecified: Secondary | ICD-10-CM | POA: Diagnosis not present

## 2018-04-07 DIAGNOSIS — R32 Unspecified urinary incontinence: Secondary | ICD-10-CM | POA: Diagnosis not present

## 2018-04-07 DIAGNOSIS — Z7982 Long term (current) use of aspirin: Secondary | ICD-10-CM | POA: Diagnosis not present

## 2018-04-07 DIAGNOSIS — N138 Other obstructive and reflux uropathy: Secondary | ICD-10-CM | POA: Diagnosis not present

## 2018-04-13 ENCOUNTER — Encounter: Payer: PPO | Admitting: Family Medicine

## 2018-04-13 DIAGNOSIS — R339 Retention of urine, unspecified: Secondary | ICD-10-CM | POA: Diagnosis not present

## 2018-04-17 ENCOUNTER — Other Ambulatory Visit: Payer: Self-pay | Admitting: Family Medicine

## 2018-04-21 ENCOUNTER — Ambulatory Visit (INDEPENDENT_AMBULATORY_CARE_PROVIDER_SITE_OTHER): Payer: PPO | Admitting: Family Medicine

## 2018-04-21 ENCOUNTER — Telehealth: Payer: Self-pay | Admitting: Family Medicine

## 2018-04-21 ENCOUNTER — Ambulatory Visit (INDEPENDENT_AMBULATORY_CARE_PROVIDER_SITE_OTHER): Payer: PPO

## 2018-04-21 ENCOUNTER — Encounter: Payer: Self-pay | Admitting: Family Medicine

## 2018-04-21 ENCOUNTER — Other Ambulatory Visit: Payer: Self-pay

## 2018-04-21 VITALS — BP 118/74 | HR 66 | Temp 97.6°F | Ht 67.0 in | Wt 281.3 lb

## 2018-04-21 VITALS — BP 118/74 | HR 66 | Temp 97.6°F | Resp 16 | Ht 67.0 in | Wt 281.5 lb

## 2018-04-21 DIAGNOSIS — I10 Essential (primary) hypertension: Secondary | ICD-10-CM | POA: Diagnosis not present

## 2018-04-21 DIAGNOSIS — K219 Gastro-esophageal reflux disease without esophagitis: Secondary | ICD-10-CM | POA: Diagnosis not present

## 2018-04-21 DIAGNOSIS — Z23 Encounter for immunization: Secondary | ICD-10-CM | POA: Diagnosis not present

## 2018-04-21 DIAGNOSIS — R740 Nonspecific elevation of levels of transaminase and lactic acid dehydrogenase [LDH]: Secondary | ICD-10-CM | POA: Diagnosis not present

## 2018-04-21 DIAGNOSIS — E782 Mixed hyperlipidemia: Secondary | ICD-10-CM | POA: Diagnosis not present

## 2018-04-21 DIAGNOSIS — K760 Fatty (change of) liver, not elsewhere classified: Secondary | ICD-10-CM

## 2018-04-21 DIAGNOSIS — R300 Dysuria: Secondary | ICD-10-CM | POA: Diagnosis not present

## 2018-04-21 DIAGNOSIS — K921 Melena: Secondary | ICD-10-CM

## 2018-04-21 DIAGNOSIS — Z0000 Encounter for general adult medical examination without abnormal findings: Secondary | ICD-10-CM | POA: Diagnosis not present

## 2018-04-21 DIAGNOSIS — R7401 Elevation of levels of liver transaminase levels: Secondary | ICD-10-CM

## 2018-04-21 DIAGNOSIS — R194 Change in bowel habit: Secondary | ICD-10-CM

## 2018-04-21 DIAGNOSIS — E559 Vitamin D deficiency, unspecified: Secondary | ICD-10-CM | POA: Diagnosis not present

## 2018-04-21 DIAGNOSIS — Z1211 Encounter for screening for malignant neoplasm of colon: Secondary | ICD-10-CM | POA: Diagnosis not present

## 2018-04-21 LAB — UA/M W/RFLX CULTURE, ROUTINE
BILIRUBIN UA: NEGATIVE
Glucose, UA: NEGATIVE
Ketones, UA: NEGATIVE
Nitrite, UA: NEGATIVE
PROTEIN UA: NEGATIVE
RBC UA: NEGATIVE
Specific Gravity, UA: 1.015 (ref 1.005–1.030)
Urobilinogen, Ur: 0.2 mg/dL (ref 0.2–1.0)
pH, UA: 5.5 (ref 5.0–7.5)

## 2018-04-21 LAB — MICROSCOPIC EXAMINATION

## 2018-04-21 LAB — BAYER DCA HB A1C WAIVED: HB A1C (BAYER DCA - WAIVED): 5.8 % (ref <7.0)

## 2018-04-21 MED ORDER — FINASTERIDE 5 MG PO TABS: 5.0000 mg | ORAL_TABLET | Freq: Every day | ORAL | 1 refills | Status: DC

## 2018-04-21 MED ORDER — RANITIDINE HCL 300 MG PO TABS: 300.0000 mg | ORAL_TABLET | Freq: Every day | ORAL | 1 refills | Status: DC

## 2018-04-21 MED ORDER — ACYCLOVIR 400 MG PO TABS: 400.0000 mg | ORAL_TABLET | Freq: Two times a day (BID) | ORAL | 1 refills | Status: DC

## 2018-04-21 MED ORDER — TAMSULOSIN HCL 0.4 MG PO CAPS: 0.4000 mg | ORAL_CAPSULE | Freq: Two times a day (BID) | ORAL | 1 refills | Status: DC

## 2018-04-21 MED ORDER — CIPROFLOXACIN HCL 500 MG PO TABS: 500.0000 mg | ORAL_TABLET | Freq: Two times a day (BID) | ORAL | 0 refills | Status: DC

## 2018-04-21 MED ORDER — LISINOPRIL 20 MG PO TABS: 20.0000 mg | ORAL_TABLET | Freq: Every day | ORAL | 1 refills | Status: DC

## 2018-04-21 NOTE — Assessment & Plan Note (Signed)
Rechecking levels today. Await results. Call with any concerns.  

## 2018-04-21 NOTE — Assessment & Plan Note (Addendum)
Under good control on current regimen. Call with any concerns. Refills given.

## 2018-04-21 NOTE — Progress Notes (Signed)
Subjective:   Derrick Knoll. is a 62 y.o. male who presents for Medicare Annual/Subsequent preventive examination.  Review of Systems:  Cardiac Risk Factors include: male gender;hypertension;advanced age (>59men, >55 women);dyslipidemia;obesity (BMI >30kg/m2)     Objective:    Vitals: BP 118/74 (BP Location: Left Arm, Patient Position: Sitting)   Pulse 66   Temp 97.6 F (36.4 C) (Temporal)   Resp 16   Ht 5\' 7"  (1.702 m)   Wt 281 lb 8 oz (127.7 kg)   SpO2 94%   BMI 44.09 kg/m   Body mass index is 44.09 kg/m.  Advanced Directives 04/21/2018 10/08/2017 02/22/2017  Does Patient Have a Medical Advance Directive? No No No  Would patient like information on creating a medical advance directive? Yes (MAU/Ambulatory/Procedural Areas - Information given) No - Patient declined No - Patient declined    Tobacco Social History   Tobacco Use  Smoking Status Never Smoker  Smokeless Tobacco Never Used     Counseling given: Not Answered   Clinical Intake:  Pre-visit preparation completed: Yes  Pain : No/denies pain     Nutritional Status: BMI > 30  Obese Nutritional Risks: None Diabetes: No  How often do you need to have someone help you when you read instructions, pamphlets, or other written materials from your doctor or pharmacy?: 1 - Never What is the last grade level you completed in school?: GED  Interpreter Needed?: No  Information entered by :: Brystol Wasilewski,lPN   Past Medical History:  Diagnosis Date  . Allergy   . GERD (gastroesophageal reflux disease)   . Hyperlipidemia   . Hypertension   . Meningitis due to viruses 1968   Past Surgical History:  Procedure Laterality Date  . JOINT REPLACEMENT Bilateral   . NASAL SINUS SURGERY    . TOTAL KNEE ARTHROPLASTY     Family History  Problem Relation Age of Onset  . Breast cancer Mother   . Cancer Father        unsure, passed away before it was confirmed  . Pancreatic cancer Daughter   . Diabetes Neg Hx     . Heart disease Neg Hx   . Hypertension Neg Hx   . Stroke Neg Hx   . COPD Neg Hx   . Colon cancer Neg Hx    Social History   Socioeconomic History  . Marital status: Married    Spouse name: Not on file  . Number of children: Not on file  . Years of education: 10th grade, GED  . Highest education level: GED or equivalent  Occupational History  . Occupation: disabled   Social Needs  . Financial resource strain: Not hard at all  . Food insecurity:    Worry: Never true    Inability: Never true  . Transportation needs:    Medical: No    Non-medical: No  Tobacco Use  . Smoking status: Never Smoker  . Smokeless tobacco: Never Used  Substance and Sexual Activity  . Alcohol use: Not Currently    Alcohol/week: 0.0 standard drinks    Comment: occasionally, twice a year   . Drug use: No  . Sexual activity: Yes  Lifestyle  . Physical activity:    Days per week: 0 days    Minutes per session: 0 min  . Stress: Not at all  Relationships  . Social connections:    Talks on phone: More than three times a week    Gets together: More than three times a  week    Attends religious service: Never    Active member of club or organization: No    Attends meetings of clubs or organizations: Never    Relationship status: Married  Other Topics Concern  . Not on file  Social History Narrative   [employer] 3x a week     Outpatient Encounter Medications as of 04/21/2018  Medication Sig  . acyclovir (ZOVIRAX) 400 MG tablet Take 1 tablet (400 mg total) by mouth 2 (two) times daily.  . Ascorbic Acid (VITAMIN C) 1000 MG tablet Take 1,000 mg by mouth daily.  Marland Kitchen aspirin EC 81 MG tablet Take 81 mg by mouth daily.  . cholecalciferol (VITAMIN D) 1000 UNITS tablet Take 2,000 Units by mouth daily.   . finasteride (PROSCAR) 5 MG tablet Take 1 tablet (5 mg total) by mouth daily.  . fluticasone (FLONASE) 50 MCG/ACT nasal spray Place 1 spray into both nostrils 2 (two) times daily.  Marland Kitchen guaiFENesin (MUCINEX)  600 MG 12 hr tablet Take by mouth 2 (two) times daily.  Marland Kitchen lisinopril (PRINIVIL,ZESTRIL) 20 MG tablet Take 1 tablet (20 mg total) by mouth daily.  Marland Kitchen loratadine (CLARITIN) 10 MG tablet Take 1 tablet (10 mg total) by mouth daily.  . montelukast (SINGULAIR) 10 MG tablet Take 1 tablet (10 mg total) by mouth at bedtime.  . Multiple Vitamins-Minerals (MULTIVITAMIN WITH MINERALS) tablet Take 1 tablet by mouth daily.  Marland Kitchen nystatin cream (MYCOSTATIN) APPLY CREAM TOPICALLY TWICE DAILY  . omeprazole (PRILOSEC) 40 MG capsule TAKE 1 CAPSULE BY MOUTH ONCE DAILY  . ranitidine (ZANTAC) 300 MG tablet Take 1 tablet (300 mg total) by mouth at bedtime.  . tamsulosin (FLOMAX) 0.4 MG CAPS capsule Take 1 capsule (0.4 mg total) by mouth 2 (two) times daily.  Marland Kitchen triamcinolone cream (KENALOG) 0.1 % Apply 1 application topically 2 (two) times daily.  Marland Kitchen azithromycin (ZITHROMAX) 250 MG tablet 2 now then 1 a day (Patient not taking: Reported on 04/21/2018)   No facility-administered encounter medications on file as of 04/21/2018.     Activities of Daily Living In your present state of health, do you have any difficulty performing the following activities: 04/21/2018  Hearing? N  Vision? N  Difficulty concentrating or making decisions? Y  Walking or climbing stairs? N  Dressing or bathing? N  Doing errands, shopping? N  Preparing Food and eating ? N  Using the Toilet? N  In the past six months, have you accidently leaked urine? Y  Comment being catheterized 3x a day   Do you have problems with loss of bowel control? N  Managing your Medications? N  Managing your Finances? N  Housekeeping or managing your Housekeeping? N  Some recent data might be hidden    Patient Care Team: Valerie Roys, DO as PCP - General (Family Medicine) Delgaizo, Lavon Paganini, MD as Referring Physician (Orthopedic Surgery) Viprakasit, Marlowe Shores, MD as Referring Physician (Urology)   Assessment:   This is a routine wellness examination for  [name].  Exercise Activities and Dietary recommendations Current Exercise Habits: Structured exercise class, Type of exercise: treadmill;strength training/weights, Time (Minutes): > 60, Frequency (Times/Week): 3, Weekly Exercise (Minutes/Week): 0, Intensity: Mild, Exercise limited by: None identified  Goals    . DIET - INCREASE WATER INTAKE     Recommend drinking at least 6-8 glasses of water a day        Fall Risk Fall Risk  04/21/2018 02/14/2018 06/06/2017 02/22/2017 01/24/2017  Falls in the past year? No  No Yes No No  Number falls in past yr: - - 1 - -  Injury with Fall? - - (No Data) - -  Comment - - bruising; fell when cleaning out gutters - -  Follow up - - Falls prevention discussed - -   Is the patient's home free of loose throw rugs in walkways, pet beds, electrical cords, etc?   no      Grab bars in the bathroom? no      Handrails on the stairs?   no stairs       Adequate lighting?   yes  Timed Get Up and Go Performed: Completed in 8 seconds with no use of assistive devices, steady gait. No intervention needed at this time.   Depression Screen PHQ 2/9 Scores 04/21/2018 06/06/2017 02/22/2017 01/24/2017  PHQ - 2 Score 0 0 0 0  PHQ- 9 Score - - 6 10    Cognitive Function     6CIT Screen 04/21/2018 02/22/2017  What Year? 0 points 0 points  What month? 0 points 0 points  What time? 0 points 0 points  Count back from 20 0 points 0 points  Months in reverse 0 points 0 points  Repeat phrase 0 points 0 points  Total Score 0 0    Immunization History  Administered Date(s) Administered  . Influenza,inj,Quad PF,6+ Mos 05/12/2015, 04/29/2016, 05/31/2017, 04/21/2018  . Influenza-Unspecified 05/16/2014, 07/28/2015  . Td 01/23/2016  . Tdap 01/05/2012, 10/08/2017    Qualifies for Shingles Vaccine? Yes, discussed shingrix vaccine   Screening Tests Health Maintenance  Topic Date Due  . COLONOSCOPY  08/16/2017  . INFLUENZA VACCINE  03/16/2018  . TETANUS/TDAP  10/09/2027  . HIV  Screening  Completed  . Hepatitis C Screening  Addressed   Cancer Screenings: Lung: Low Dose CT Chest recommended if Age 15-80 years, 30 pack-year currently smoking OR have quit w/in 15years. Patient does not qualify. Colorectal: due now cologuard ordered  Additional Screenings:  Hepatitis C Screening: completed 06/11/2016      Plan:    I have personally reviewed and addressed the Medicare Annual Wellness questionnaire and have noted the following in the patient's chart:  A. Medical and social history B. Use of alcohol, tobacco or illicit drugs  C. Current medications and supplements D. Functional ability and status E.  Nutritional status F.  Physical activity G. Advance directives H. List of other physicians I.  Hospitalizations, surgeries, and ER visits in previous 12 months J.  [place] such as hearing and vision if needed, cognitive and depression L. Referrals and appointments   In addition, I have reviewed and discussed with patient certain preventive protocols, quality metrics, and best practice recommendations. A written personalized care plan for preventive services as well as general preventive health recommendations were provided to patient.   Signed,  Tyler Aas, LPN Nurse Health Advisor   Nurse Notes:none

## 2018-04-21 NOTE — Patient Instructions (Signed)
[facility] [address] [place] [address] Phone: [phone]  Friday September 13th 2019 @ 10:15 a.m.

## 2018-04-21 NOTE — Telephone Encounter (Signed)
Copied from [place] [id number]. Topic: General - Other >> Apr 21, 2018  2:35 PM Yvette Rack wrote: Reason for CRM: pt states that he tried to reach out to his urologist and couldn't get hold of them he states that it was told to him if he couldn't reach them the urologist office that Wynetta Emery was going to send a RX for his prostate [facility] (N), [place] - [place] [phone] (Phone) [phone] (Fax)

## 2018-04-21 NOTE — Assessment & Plan Note (Signed)
Under good control on current regimen. Continue current regimen. Continue to monitor. Call with any concerns. Refills given.   

## 2018-04-21 NOTE — Patient Instructions (Signed)
Mr. Derrick Sandoval , Thank you for taking time to come for your Medicare Wellness Visit. I appreciate your ongoing commitment to your health goals. Please review the following plan we discussed and let me know if I can assist you in the future.   Screening recommendations/referrals: Colonoscopy: cologuard ordered today  Recommended yearly ophthalmology/optometry visit for glaucoma screening and checkup Recommended yearly dental visit for hygiene and checkup  Vaccinations: Influenza vaccine: done today  Pneumococcal vaccine: due at age 49  Tdap vaccine: up to date  Shingles vaccine: shingrix eligible, check with your insurance company for coverage  Advanced directives: Advance directive discussed with you today. Even though you declined this today please call our office should you change your mind and we can give you the proper paperwork for you to fill out.  Conditions/risks identified: Recommend drinking at least 6-8 glasses of water a day   Next appointment: Follow up in one year for your annual wellness exam.   Preventive Care 40-64 Years, Male Preventive care refers to lifestyle choices and visits with your health care provider that can promote health and wellness. What does preventive care include?  A yearly physical exam. This is also called an annual well check.  Dental exams once or twice a year.  Routine eye exams. Ask your health care provider how often you should have your eyes checked.  Personal lifestyle choices, including:  Daily care of your teeth and gums.  Regular physical activity.  Eating a healthy diet.  Avoiding tobacco and drug use.  Limiting alcohol use.  Practicing safe sex.  Taking low-dose aspirin every day starting at age 93. What happens during an annual well check? The services and screenings done by your health care provider during your annual well check will depend on your age, overall health, lifestyle risk factors, and family history of  disease. Counseling  Your health care provider may ask you questions about your:  Alcohol use.  Tobacco use.  Drug use.  Emotional well-being.  Home and relationship well-being.  Sexual activity.  Eating habits.  Work and work Statistician. Screening  You may have the following tests or measurements:  Height, weight, and BMI.  Blood pressure.  Lipid and cholesterol levels. These may be checked every 5 years, or more frequently if you are over 81 years old.  Skin check.  Lung cancer screening. You may have this screening every year starting at age 33 if you have a 30-pack-year history of smoking and currently smoke or have quit within the past 15 years.  Fecal occult blood test (FOBT) of the stool. You may have this test every year starting at age 36.  Flexible sigmoidoscopy or colonoscopy. You may have a sigmoidoscopy every 5 years or a colonoscopy every 10 years starting at age 90.  Prostate cancer screening. Recommendations will vary depending on your family history and other risks.  Hepatitis C blood test.  Hepatitis B blood test.  Sexually transmitted disease (STD) testing.  Diabetes screening. This is done by checking your blood sugar (glucose) after you have not eaten for a while (fasting). You may have this done every 1-3 years. Discuss your test results, treatment options, and if necessary, the need for more tests with your health care provider. Vaccines  Your health care provider may recommend certain vaccines, such as:  Influenza vaccine. This is recommended every year.  Tetanus, diphtheria, and acellular pertussis (Tdap, Td) vaccine. You may need a Td booster every 10 years.  Zoster vaccine. You may need  this after age 35.  Pneumococcal 13-valent conjugate (PCV13) vaccine. You may need this if you have certain conditions and have not been vaccinated.  Pneumococcal polysaccharide (PPSV23) vaccine. You may need one or two doses if you smoke cigarettes  or if you have certain conditions. Talk to your health care provider about which screenings and vaccines you need and how often you need them. This information is not intended to replace advice given to you by your health care provider. Make sure you discuss any questions you have with your health care provider. Document Released: 08/29/2015 Document Revised: 04/21/2016 Document Reviewed: 06/03/2015 Elsevier Interactive Patient Education  2017 [employer] Prevention in the Home Falls can cause injuries. They can happen to people of all ages. There are many things you can do to make your home safe and to help prevent falls. What can I do on the outside of my home?  Regularly fix the edges of walkways and driveways and fix any cracks.  Remove anything that might make you trip as you walk through a door, such as a raised step or threshold.  Trim any bushes or trees on the path to your home.  Use bright outdoor lighting.  Clear any walking paths of anything that might make someone trip, such as rocks or tools.  Regularly check to see if handrails are loose or broken. Make sure that both sides of any steps have handrails.  Any raised decks and porches should have guardrails on the edges.  Have any leaves, snow, or ice cleared regularly.  Use sand or salt on walking paths during winter.  Clean up any spills in your garage right away. This includes oil or grease spills. What can I do in the bathroom?  Use night lights.  Install grab bars by the toilet and in the tub and shower. Do not use towel bars as grab bars.  Use non-skid mats or decals in the tub or shower.  If you need to sit down in the shower, use a plastic, non-slip stool.  Keep the floor dry. Clean up any water that spills on the floor as soon as it happens.  Remove soap buildup in the tub or shower regularly.  Attach bath mats securely with double-sided non-slip rug tape.  Do not have throw rugs and other  things on the floor that can make you trip. What can I do in the bedroom?  Use night lights.  Make sure that you have a light by your bed that is easy to reach.  Do not use any sheets or blankets that are too big for your bed. They should not hang down onto the floor.  Have a firm chair that has side arms. You can use this for support while you get dressed.  Do not have throw rugs and other things on the floor that can make you trip. What can I do in the kitchen?  Clean up any spills right away.  Avoid walking on wet floors.  Keep items that you use a lot in easy-to-reach places.  If you need to reach something above you, use a strong step stool that has a grab bar.  Keep electrical cords out of the way.  Do not use floor polish or wax that makes floors slippery. If you must use wax, use non-skid floor wax.  Do not have throw rugs and other things on the floor that can make you trip. What can I do with my stairs?  Do not  leave any items on the stairs.  Make sure that there are handrails on both sides of the stairs and use them. Fix handrails that are broken or loose. Make sure that handrails are as long as the stairways.  Check any carpeting to make sure that it is firmly attached to the stairs. Fix any carpet that is loose or worn.  Avoid having throw rugs at the top or bottom of the stairs. If you do have throw rugs, attach them to the floor with carpet tape.  Make sure that you have a light switch at the top of the stairs and the bottom of the stairs. If you do not have them, ask someone to add them for you. What else can I do to help prevent falls?  Wear shoes that:  Do not have high heels.  Have rubber bottoms.  Are comfortable and fit you well.  Are closed at the toe. Do not wear sandals.  If you use a stepladder:  Make sure that it is fully opened. Do not climb a closed stepladder.  Make sure that both sides of the stepladder are locked into place.  Ask  someone to hold it for you, if possible.  Clearly mark and make sure that you can see:  Any grab bars or handrails.  First and last steps.  Where the edge of each step is.  Use tools that help you move around (mobility aids) if they are needed. These include:  Canes.  Walkers.  Scooters.  Crutches.  Turn on the lights when you go into a dark area. Replace any light bulbs as soon as they burn out.  Set up your furniture so you have a clear path. Avoid moving your furniture around.  If any of your floors are uneven, fix them.  If there are any pets around you, be aware of where they are.  Review your medicines with your doctor. Some medicines can make you feel dizzy. This can increase your chance of falling. Ask your doctor what other things that you can do to help prevent falls. This information is not intended to replace advice given to you by your health care provider. Make sure you discuss any questions you have with your health care provider. Document Released: 05/29/2009 Document Revised: 01/08/2016 Document Reviewed: 09/06/2014 Elsevier Interactive Patient Education  2017 [employer].

## 2018-04-21 NOTE — Progress Notes (Signed)
BP 118/74   Pulse 66   Temp 97.6 F (36.4 C) (Oral)   Ht 5\' 7"  (1.702 m)   Wt 281 lb 5 oz (127.6 kg)   SpO2 94%   BMI 44.06 kg/m    Subjective:    Patient ID: Derrick Sandoval., male    DOB: [birth year], 62 y.o.   MRN: [id number]  HPI: Derrick Sandoval. is a 62 y.o. male  Chief Complaint  Patient presents with  . Hypertension  . Hyperlipidemia   HYPERTENSION / HYPERLIPIDEMIA Satisfied with current treatment? yes Duration of hypertension: chronic BP monitoring frequency: not checking BP medication side effects: no Past BP meds: lisinopril Duration of hyperlipidemia: chronic Cholesterol medication side effects: no Cholesterol supplements: None Past cholesterol medications: none Medication compliance: good compliance Aspirin: yes Recent stressors: no Recurrent headaches: no Visual changes: no Palpitations: no Dyspnea: no Chest pain: no Lower extremity edema: no Dizzy/lightheaded: no  Has been seeing urology for evaluation. Has been doing home self-caths. To have scopes in October for his urologic issues. He has been having increased issues with his stool and is concerned. He notes that his stools have been black and he has been more constipated. He regularly takes miralax, and has been taking it BID now but only able to go every couple of days. He notes that he is feeling irritaible and not like himself. He is concerned that he has a prostate infection. No other concerns at this time.   Relevant past medical, surgical, family and social history reviewed and updated as indicated. Interim medical history since our last visit reviewed. Allergies and medications reviewed and updated.  Review of Systems  Constitutional: Negative.   Respiratory: Negative.   Cardiovascular: Negative.   Gastrointestinal: Positive for constipation and rectal pain. Negative for abdominal distention, abdominal pain, anal bleeding, blood in stool, diarrhea, nausea and vomiting.       Black  stools, has been having more constipation.   Genitourinary: Positive for difficulty urinating. Negative for decreased urine volume, discharge, dysuria, enuresis, flank pain, frequency, genital sores, hematuria, penile pain, penile swelling, scrotal swelling, testicular pain and urgency.  Skin: Negative.   Neurological: Negative.   Psychiatric/Behavioral: Negative.     Per HPI unless specifically indicated above     Objective:    BP 118/74   Pulse 66   Temp 97.6 F (36.4 C) (Oral)   Ht 5\' 7"  (1.702 m)   Wt 281 lb 5 oz (127.6 kg)   SpO2 94%   BMI 44.06 kg/m   Wt Readings from Last 3 Encounters:  04/21/18 281 lb 5 oz (127.6 kg)  04/21/18 281 lb 8 oz (127.7 kg)  02/14/18 274 lb (124.3 kg)    Physical Exam  Constitutional: He is oriented to person, place, and time. He appears well-developed and well-nourished. No distress.  HENT:  Head: Normocephalic and atraumatic.  Right Ear: Hearing normal.  Left Ear: Hearing normal.  Nose: Nose normal.  Eyes: Conjunctivae and lids are normal. Right eye exhibits no discharge. Left eye exhibits no discharge. No scleral icterus.  Cardiovascular: Normal rate, regular rhythm, normal heart sounds and intact distal pulses. Exam reveals no gallop and no friction rub.  No murmur heard. Pulmonary/Chest: Effort normal and breath sounds normal. No stridor. No respiratory distress. He has no wheezes. He has no rales. He exhibits no tenderness.  Musculoskeletal: Normal range of motion.  Neurological: He is alert and oriented to person, place, and time.  Skin: Skin is warm,  dry and intact. Capillary refill takes less than 2 seconds. No rash noted. He is not diaphoretic. No erythema. No pallor.  Psychiatric: He has a normal mood and affect. His speech is normal and behavior is normal. Judgment and thought content normal. Cognition and memory are normal.  Nursing note and vitals reviewed.   Results for orders placed or performed in visit on 01/18/18  CBC  with Differential/Platelet  Result Value Ref Range   WBC 7.9 3.4 - 10.8 x10E3/uL   RBC 5.10 4.14 - 5.80 x10E6/uL   Hemoglobin 15.6 13.0 - 17.7 g/dL   Hematocrit 45.6 37.5 - 51.0 %   MCV 89 79 - 97 fL   MCH 30.6 26.6 - 33.0 pg   MCHC 34.2 31.5 - 35.7 g/dL   RDW 14.3 12.3 - 15.4 %   Platelets 218 150 - 450 x10E3/uL   Neutrophils 51 Not Estab. %   Lymphs 35 Not Estab. %   Monocytes 11 Not Estab. %   Eos 3 Not Estab. %   Basos 0 Not Estab. %   Neutrophils Absolute 4.0 1.4 - 7.0 x10E3/uL   Lymphocytes Absolute 2.8 0.7 - 3.1 x10E3/uL   Monocytes Absolute 0.9 0.1 - 0.9 x10E3/uL   EOS (ABSOLUTE) 0.2 0.0 - 0.4 x10E3/uL   Basophils Absolute 0.0 0.0 - 0.2 x10E3/uL   Immature Granulocytes 0 Not Estab. %   Immature Grans (Abs) 0.0 0.0 - 0.1 x10E3/uL  Comprehensive metabolic panel  Result Value Ref Range   Glucose 80 65 - 99 mg/dL   BUN 17 8 - 27 mg/dL   Creatinine, Ser 0.88 0.76 - 1.27 mg/dL   GFR calc non Af Amer 93 >59 mL/min/1.73   GFR calc Af Amer 108 >59 mL/min/1.73   BUN/Creatinine Ratio 19 10 - 24   Sodium 141 134 - 144 mmol/L   Potassium 4.3 3.5 - 5.2 mmol/L   Chloride 104 96 - 106 mmol/L   CO2 19 (L) 20 - 29 mmol/L   Calcium 9.4 8.6 - 10.2 mg/dL   Total Protein 7.1 6.0 - 8.5 g/dL   Albumin 4.5 3.6 - 4.8 g/dL   Globulin, Total 2.6 1.5 - 4.5 g/dL   Albumin/Globulin Ratio 1.7 1.2 - 2.2   Bilirubin Total 0.3 0.0 - 1.2 mg/dL   Alkaline Phosphatase 66 39 - 117 IU/L   AST 30 0 - 40 IU/L   ALT 33 0 - 44 IU/L      Assessment & Plan:   Problem List Items Addressed This Visit      Cardiovascular and Mediastinum   Benign hypertension - Primary    Under good control on current regimen. Call with any concerns. Refills given.       Relevant Medications   lisinopril (PRINIVIL,ZESTRIL) 20 MG tablet   Other Relevant Orders   Bayer DCA Hb A1c Waived   CBC with Differential/Platelet   Comprehensive metabolic panel     Digestive   Fatty liver    Rechecking levels today. Await  results. Call with any concerns.       Relevant Orders   Bayer DCA Hb A1c Waived   CBC with Differential/Platelet   Comprehensive metabolic panel   GERD (gastroesophageal reflux disease)    Under good control on current regimen. Continue current regimen. Continue to monitor. Call with any concerns. Refills given.        Relevant Medications   ranitidine (ZANTAC) 300 MG tablet   Other Relevant Orders   Bayer DCA Hb A1c Waived  CBC with Differential/Platelet   Comprehensive metabolic panel     Other   Elevated transaminase level    Rechecking levels today. Await results. Call with any concerns.       Relevant Orders   Bayer DCA Hb A1c Waived   CBC with Differential/Platelet   Comprehensive metabolic panel   Hyperlipidemia    Under good control on current regimen. Continue current regimen. Continue to monitor. Call with any concerns. Refills given.        Relevant Medications   lisinopril (PRINIVIL,ZESTRIL) 20 MG tablet   Other Relevant Orders   Bayer DCA Hb A1c Waived   CBC with Differential/Platelet   Comprehensive metabolic panel   Lipid Panel w/o Chol/HDL Ratio   Vitamin D deficiency    Rechecking levels today. Await results. Call with any concerns.       Relevant Orders   Bayer DCA Hb A1c Waived   CBC with Differential/Platelet   Comprehensive metabolic panel    Other Visit Diagnoses    Black stools       Will see GI. Appointment scheduled for next week.    Relevant Orders   Ambulatory referral to Gastroenterology   Change in bowel habits       Will see GI. Appointment scheduled for next week.    Relevant Orders   Ambulatory referral to Gastroenterology   Dysuria       Will reach out to his urologist, if he hasn't heard anything, will call in cipro for him.    Relevant Orders   Urine Culture   UA/M w/rflx Culture, Routine       Follow up plan: Return in about 6 months (around 10/20/2018) for Physical.

## 2018-04-22 LAB — COMPREHENSIVE METABOLIC PANEL
ALT: 47 IU/L — AB (ref 0–44)
AST: 33 IU/L (ref 0–40)
Albumin/Globulin Ratio: 2 (ref 1.2–2.2)
Albumin: 4.6 g/dL (ref 3.6–4.8)
Alkaline Phosphatase: 60 IU/L (ref 39–117)
BILIRUBIN TOTAL: 0.4 mg/dL (ref 0.0–1.2)
BUN/Creatinine Ratio: 18 (ref 10–24)
BUN: 18 mg/dL (ref 8–27)
CO2: 22 mmol/L (ref 20–29)
CREATININE: 1 mg/dL (ref 0.76–1.27)
Calcium: 9.7 mg/dL (ref 8.6–10.2)
Chloride: 104 mmol/L (ref 96–106)
GFR calc non Af Amer: 81 mL/min/{1.73_m2} (ref >59)
GFR, EST AFRICAN AMERICAN: 94 mL/min/{1.73_m2} (ref >59)
GLUCOSE: 104 mg/dL — AB (ref 65–99)
Globulin, Total: 2.3 g/dL (ref 1.5–4.5)
Potassium: 4.4 mmol/L (ref 3.5–5.2)
Sodium: 144 mmol/L (ref 134–144)
TOTAL PROTEIN: 6.9 g/dL (ref 6.0–8.5)

## 2018-04-22 LAB — LIPID PANEL W/O CHOL/HDL RATIO
CHOLESTEROL TOTAL: 178 mg/dL (ref 100–199)
HDL: 42 mg/dL (ref >39)
LDL Calculated: 114 mg/dL — ABNORMAL HIGH (ref 0–99)
Triglycerides: 112 mg/dL (ref 0–149)
VLDL Cholesterol Cal: 22 mg/dL (ref 5–40)

## 2018-04-22 LAB — CBC WITH DIFFERENTIAL/PLATELET
BASOS ABS: 0 10*3/uL (ref 0.0–0.2)
Basos: 1 %
EOS (ABSOLUTE): 0.2 10*3/uL (ref 0.0–0.4)
Eos: 3 %
HEMOGLOBIN: 15.6 g/dL (ref 13.0–17.7)
Hematocrit: 47.8 % (ref 37.5–51.0)
IMMATURE GRANS (ABS): 0 10*3/uL (ref 0.0–0.1)
Immature Granulocytes: 0 %
LYMPHS: 29 %
Lymphocytes Absolute: 2 10*3/uL (ref 0.7–3.1)
MCH: 28.8 pg (ref 26.6–33.0)
MCHC: 32.6 g/dL (ref 31.5–35.7)
MCV: 88 fL (ref 79–97)
Monocytes Absolute: 0.8 10*3/uL (ref 0.1–0.9)
Monocytes: 11 %
NEUTROS ABS: 3.8 10*3/uL (ref 1.4–7.0)
NEUTROS PCT: 56 %
PLATELETS: 209 10*3/uL (ref 150–450)
RBC: 5.42 x10E6/uL (ref 4.14–5.80)
RDW: 14.1 % (ref 12.3–15.4)
WBC: 6.9 10*3/uL (ref 3.4–10.8)

## 2018-04-24 ENCOUNTER — Telehealth: Payer: Self-pay | Admitting: Family Medicine

## 2018-04-24 LAB — URINE CULTURE

## 2018-04-24 MED ORDER — NITROFURANTOIN MONOHYD MACRO 100 MG PO CAPS: 100.0000 mg | ORAL_CAPSULE | Freq: Two times a day (BID) | ORAL | 0 refills | Status: DC

## 2018-04-24 NOTE — Telephone Encounter (Signed)
Please let him know that his urine culture was resistant to cipro. He can stop the cipro and start the nitrofurantoin that I've sent to his pharmacy. Thanks!

## 2018-04-24 NOTE — Telephone Encounter (Signed)
Patient notified and verbalized understanding. 

## 2018-04-28 ENCOUNTER — Encounter: Payer: Self-pay | Admitting: Gastroenterology

## 2018-04-28 ENCOUNTER — Other Ambulatory Visit: Payer: Self-pay

## 2018-04-28 ENCOUNTER — Ambulatory Visit (INDEPENDENT_AMBULATORY_CARE_PROVIDER_SITE_OTHER): Payer: PPO | Admitting: Gastroenterology

## 2018-04-28 VITALS — BP 118/74 | HR 76 | Ht 67.0 in | Wt 280.6 lb

## 2018-04-28 DIAGNOSIS — K921 Melena: Secondary | ICD-10-CM | POA: Diagnosis not present

## 2018-04-28 DIAGNOSIS — K5900 Constipation, unspecified: Secondary | ICD-10-CM

## 2018-04-28 NOTE — Progress Notes (Signed)
Jonathon Bellows MD, MRCP(U.K) [address]  [place]  [place], [place] [address]  Main: [phone]  Fax: [phone]   Gastroenterology Consultation  Referring Provider:     Valerie Roys, DO Primary Care Physician:  Valerie Roys, DO Primary Gastroenterologist:  Dr. Jonathon Bellows  Reason for Consultation:     Black stools         HPI:   Derrick Rockett. is a 62 y.o. y/o male referred for consultation & management  by [name], Megan P, DO.    He has been referred to [place] for black stools.  Labs 04/21/2018 demonstrates a hemoglobin of 15.6 and an MCV of 88.  CMP normal except a mildly elevated ALT at 47 and a glucose of 104.  CT scan of the abdomen in May 2018 demonstrated per rectum mildly distended with stool, no rectal wall thickening.  He says that he had some black stools began for a few weeks. Dark black , no abdominal pain. Went on for 2-3 days. Suffers from constipation for some years , miralax BID ,works sometimes. Consumes fruit and vegetables daily.   Last colonoscopy 10 years back. Takes ibuprofen sometimes- few times a week and takes it for headaches. No family or personal history of colon cancer or polyps.      H/o snoring,headaches, restless legs.  Past Medical History:  Diagnosis Date  . Allergy   . GERD (gastroesophageal reflux disease)   . Hyperlipidemia   . Hypertension   . Meningitis due to viruses 1968    Past Surgical History:  Procedure Laterality Date  . JOINT REPLACEMENT Bilateral   . NASAL SINUS SURGERY    . TOTAL KNEE ARTHROPLASTY      Prior to Admission medications   Medication Sig Start Date End Date Taking? Authorizing Provider  acyclovir (ZOVIRAX) 400 MG tablet Take 1 tablet (400 mg total) by mouth 2 (two) times daily. 04/21/18  Yes Johnson, Megan P, DO  Ascorbic Acid (VITAMIN C) 1000 MG tablet Take 1,000 mg by mouth daily.   Yes [provider]  aspirin EC 81 MG tablet Take 81 mg by mouth daily.   Yes [provider]  cholecalciferol (VITAMIN D) 1000 UNITS tablet Take 2,000 Units by mouth daily.    Yes [provider]  finasteride (PROSCAR) 5 MG tablet Take 1 tablet (5 mg total) by mouth daily. 04/21/18  Yes Johnson, Megan P, DO  fluticasone (FLONASE) 50 MCG/ACT nasal spray Place 1 spray into both nostrils 2 (two) times daily. 12/12/17  Yes Johnson, Megan P, DO  guaiFENesin (MUCINEX) 600 MG 12 hr tablet Take by mouth 2 (two) times daily.   Yes [provider]  lisinopril (PRINIVIL,ZESTRIL) 20 MG tablet Take 1 tablet (20 mg total) by mouth daily. 04/21/18  Yes Johnson, Megan P, DO  montelukast (SINGULAIR) 10 MG tablet Take 1 tablet (10 mg total) by mouth at bedtime. 09/09/17  Yes Johnson, Megan P, DO  Multiple Vitamins-Minerals (MULTIVITAMIN WITH MINERALS) tablet Take 1 tablet by mouth daily.   Yes [provider]  nitrofurantoin, macrocrystal-monohydrate, (MACROBID) 100 MG capsule Take 1 capsule (100 mg total) by mouth 2 (two) times daily. 04/24/18  Yes Johnson, Megan P, DO  nystatin cream (MYCOSTATIN) APPLY CREAM TOPICALLY TWICE DAILY 08/29/17  Yes Johnson, Megan P, DO  omeprazole (PRILOSEC) 40 MG capsule TAKE 1 CAPSULE BY MOUTH ONCE DAILY 04/17/18  Yes Johnson, Megan P, DO  ranitidine (ZANTAC) 300 MG tablet Take 1 tablet (  300 mg total) by mouth at bedtime. 04/21/18  Yes Johnson, Megan P, DO  tamsulosin (FLOMAX) 0.4 MG CAPS capsule Take 1 capsule (0.4 mg total) by mouth 2 (two) times daily. 04/21/18  Yes Johnson, Megan P, DO  triamcinolone cream (KENALOG) 0.1 % Apply 1 application topically 2 (two) times daily.   Yes [provider]  ciprofloxacin (CIPRO) 500 MG tablet Take 1 tablet (500 mg total) by mouth 2 (two) times daily. Patient not taking: Reported on 04/28/2018 04/21/18   Valerie Roys, DO    Family History  Problem Relation Age of Onset  . Breast cancer Mother   . Cancer Father        unsure, passed away before it was confirmed  . Pancreatic cancer Daughter     . Diabetes Neg Hx   . Heart disease Neg Hx   . Hypertension Neg Hx   . Stroke Neg Hx   . COPD Neg Hx   . Colon cancer Neg Hx      Social History   Tobacco Use  . Smoking status: Never Smoker  . Smokeless tobacco: Never Used  Substance Use Topics  . Alcohol use: Not Currently    Alcohol/week: 0.0 standard drinks    Comment: occasionally, twice a year   . Drug use: No    Allergies as of 04/28/2018  . (No Known Allergies)    Review of Systems:    All systems reviewed and negative except where noted in HPI.   Physical Exam:  BP 118/74   Pulse 76   Ht 5\' 7"  (1.702 m)   Wt 280 lb 9.6 oz (127.3 kg)   BMI 43.95 kg/m  No LMP for male patient. Psych:  Alert and cooperative. Normal mood and affect. General:   Alert,  Well-developed, well-nourished, pleasant and cooperative in NAD Head:  Normocephalic and atraumatic. Eyes:  Sclera clear, no icterus.   Conjunctiva pink. Ears:  Normal auditory acuity. Nose:  No deformity, discharge, or lesions. Mouth:  No deformity or lesions,oropharynx pink & moist. Neck:  Supple; no masses or thyromegaly. Lungs:  Respirations even and unlabored.  Clear throughout to auscultation.   No wheezes, crackles, or rhonchi. No acute distress. Heart:  Regular rate and rhythm; no murmurs, clicks, rubs, or gallops. Abdomen:  Normal bowel sounds.  No bruits.  Soft, non-tender and non-distended without masses, hepatosplenomegaly or hernias noted.  No guarding or rebound tenderness.    Neurologic:  Alert and oriented x3;  grossly normal neurologically. Skin:  Intact without significant lesions or rashes. No jaundice. Lymph Nodes:  No significant cervical adenopathy. Psych:  Alert and cooperative. Normal mood and affect.  Imaging Studies: No results found.  Assessment and Plan:   Derrick Scarfo. is a 62 y.o. y/o male has been referred for black stools, history of constipation . Long term NSAID use.    Plan   1. EGD+ colonoscopy  2. Limit use  of NSAID's 3. High fiber diet  4. Trial of Linzess 290 mcg - 2 weeks samples provided 5. He may have sleep apnea- suggested to discuss with Dr Wynetta Emery regarding sleep study   I have discussed alternative options, risks & benefits,  which include, but are not limited to, bleeding, infection, perforation,respiratory complication & drug reaction.  The patient agrees with this plan & written consent will be obtained.     Follow up PRN  Dr Jonathon Bellows MD,MRCP(U.K)

## 2018-04-28 NOTE — Patient Instructions (Signed)
High-Fiber Diet  Fiber, also called dietary fiber, is a type of carbohydrate found in fruits, vegetables, whole grains, and beans. A high-fiber diet can have many health benefits. Your health care provider may recommend a high-fiber diet to help:  · Prevent constipation. Fiber can make your bowel movements more regular.  · Lower your cholesterol.  · Relieve hemorrhoids, uncomplicated diverticulosis, or irritable bowel syndrome.  · Prevent overeating as part of a weight-loss plan.  · Prevent heart disease, type 2 diabetes, and certain cancers.    What is my plan?  The recommended daily intake of fiber includes:  · 38 grams for men under age 50.  · 30 grams for men over age 50.  · 25 grams for women under age 50.  · 21 grams for women over age 50.    You can get the recommended daily intake of dietary fiber by eating a variety of fruits, vegetables, grains, and beans. Your health care provider may also recommend a fiber supplement if it is not possible to get enough fiber through your diet.  What do I need to know about a high-fiber diet?  · Fiber supplements have not been widely studied for their effectiveness, so it is better to get fiber through food sources.  · Always check the fiber content on the nutrition facts label of any prepackaged food. Look for foods that contain at least 5 grams of fiber per serving.  · Ask your dietitian if you have questions about specific foods that are related to your condition, especially if those foods are not listed in the following section.  · Increase your daily fiber consumption gradually. Increasing your intake of dietary fiber too quickly may cause bloating, cramping, or gas.  · Drink plenty of water. Water helps you to digest fiber.  What foods can I eat?  Grains  Whole-grain breads. Multigrain cereal. Oats and oatmeal. Brown rice. Barley. Bulgur wheat. Millet. Bran muffins. Popcorn. Rye wafer crackers.  Vegetables   Sweet potatoes. Spinach. Kale. Artichokes. Cabbage. Broccoli. Green peas. Carrots. Squash.  Fruits  Berries. Pears. Apples. Oranges. Avocados. Prunes and raisins. Dried figs.  Meats and Other Protein Sources  [place]y, kidney, pinto, and soy beans. Split peas. Lentils. Nuts and seeds.  Dairy  Fiber-fortified yogurt.  Beverages  Fiber-fortified soy milk. Fiber-fortified orange juice.  Other  Fiber bars.  The items listed above may not be a complete list of recommended foods or beverages. Contact your dietitian for more options.  What foods are not recommended?  Grains  White bread. Pasta made with refined flour. White rice.  Vegetables  Fried potatoes. Canned vegetables. Well-cooked vegetables.  Fruits  Fruit juice. Cooked, strained fruit.  Meats and Other Protein Sources  Fatty cuts of meat. Fried poultry or fried fish.  Dairy  Milk. Yogurt. Cream cheese. Sour cream.  Beverages  Soft drinks.  Other  Cakes and pastries. Butter and oils.  The items listed above may not be a complete list of foods and beverages to avoid. Contact your dietitian for more information.  What are some tips for including high-fiber foods in my diet?  · Eat a wide variety of high-fiber foods.  · Make sure that half of all grains consumed each day are whole grains.  · Replace breads and cereals made from refined flour or white flour with whole-grain breads and cereals.  · Replace white rice with brown rice, bulgur wheat, or millet.  · Start the day with a breakfast that is high in fiber,   such as a cereal that contains at least 5 grams of fiber per serving.  · Use beans in place of meat in soups, salads, or pasta.  · Eat high-fiber snacks, such as berries, raw vegetables, nuts, or popcorn.  This information is not intended to replace advice given to you by your health care provider. Make sure you discuss any questions you have with your health care provider.  Document Released: 08/02/2005 Document Revised: 01/08/2016 Document Reviewed: 01/15/2014   Elsevier Interactive Patient Education © 2018 [employer]

## 2018-05-01 ENCOUNTER — Telehealth: Payer: Self-pay

## 2018-05-01 ENCOUNTER — Telehealth: Payer: Self-pay | Admitting: Gastroenterology

## 2018-05-01 NOTE — Telephone Encounter (Signed)
-----   Message from Jonathon Bellows, MD sent at 05/01/2018  9:08 AM EDT ----- Regarding: RE: Request for medication dose change Yes   ----- Message ----- From: Rushie Chestnut, CMA Sent: 05/01/2018   8:50 AM EDT To: Jonathon Bellows, MD Subject: Request for medication dose change             Dr. Vicente Males  Derrick Sandoval called this morning stating the 240mcg Linzess is too strong and would like a lesser dose. Is it okay to give him 175mcg samples?

## 2018-05-01 NOTE — Telephone Encounter (Signed)
Pt called stating 259mcg Linzess is too strong, pt requested a lower dose or change of medication. Per Dr. Vicente Males, pt may reduce to 121mcg Linzess. Pt will be picking up new samples from our office.

## 2018-05-01 NOTE — Telephone Encounter (Signed)
Pt is calling to see if he could pick up Samples that are less potent then the rx samples off  rx [place] Dr. Vicente Males gave him last week

## 2018-05-04 ENCOUNTER — Telehealth: Payer: Self-pay | Admitting: Gastroenterology

## 2018-05-08 ENCOUNTER — Telehealth: Payer: Self-pay | Admitting: Gastroenterology

## 2018-05-08 ENCOUNTER — Telehealth: Payer: Self-pay | Admitting: Family Medicine

## 2018-05-08 MED ORDER — NYSTATIN 100000 UNIT/GM EX CREA: TOPICAL_CREAM | CUTANEOUS | 6 refills | Status: DC

## 2018-05-08 NOTE — Telephone Encounter (Signed)
Copied from [place] [id number]. Topic: Quick Communication - See Telephone Encounter >> May 08, 2018 11:29 AM Ivar Drape wrote: CRM for notification. See Telephone encounter for: 05/08/18. Patient would like his nystatin cream (MYCOSTATIN) medication refilled and sent to his preferred pharmacy [facility] on [address].

## 2018-05-08 NOTE — Telephone Encounter (Signed)
error 

## 2018-05-08 NOTE — Telephone Encounter (Signed)
Pt left vm he states he was given  rx [place] 72 mg and still can not control it and would like to know where to go from here

## 2018-05-09 ENCOUNTER — Telehealth: Payer: Self-pay | Admitting: Gastroenterology

## 2018-05-09 NOTE — Telephone Encounter (Signed)
Returned pt call regarding medication problem with Linzess 70mcg. I have advised pt to stop taking Linzess and to await Dr. Georgeann Oppenheim instructions.

## 2018-05-09 NOTE — Telephone Encounter (Signed)
Pt says the new 72mg  prescription did not work effectively. Would like to know if another medication could be prescribed.

## 2018-05-10 ENCOUNTER — Other Ambulatory Visit: Payer: Self-pay

## 2018-05-10 MED ORDER — LACTULOSE 10 G PO PACK: 10.0000 g | PACK | Freq: Every day | ORAL | 0 refills | Status: DC

## 2018-05-10 NOTE — Telephone Encounter (Signed)
Stop linzess and try miralax 1 capful daily

## 2018-05-10 NOTE — Telephone Encounter (Signed)
Spoke with pt and informed him Dr. Vicente Males has changed medication to Lactulose. Prescription has been sent to pharmacy.

## 2018-05-10 NOTE — Telephone Encounter (Signed)
Spoke with pt and informed him of instructions to stop the Linzess and begin taking Miralax 1 capful daily. Pt states Miralax does not help, he was taking 2 caps full daily before beginning the [place].  Please advise. [name]

## 2018-05-10 NOTE — Telephone Encounter (Signed)
Try lactulose start at 10 ml.once daily and if doesn't work increase to bid

## 2018-05-12 ENCOUNTER — Ambulatory Visit
Admission: RE | Admit: 2018-05-12 | Discharge: 2018-05-12 | Disposition: A | Discharge status: Home / Self Care | Payer: PPO | Source: Ambulatory Visit | Attending: Gastroenterology | Admitting: Gastroenterology

## 2018-05-12 ENCOUNTER — Encounter: Payer: Self-pay | Admitting: Anesthesiology

## 2018-05-12 ENCOUNTER — Ambulatory Visit: Payer: PPO | Admitting: Anesthesiology

## 2018-05-12 ENCOUNTER — Encounter
Admission: RE | Disposition: A | Discharge status: Home / Self Care | Payer: Self-pay | Source: Ambulatory Visit | Attending: Gastroenterology

## 2018-05-12 DIAGNOSIS — K2970 Gastritis, unspecified, without bleeding: Secondary | ICD-10-CM | POA: Diagnosis not present

## 2018-05-12 DIAGNOSIS — I10 Essential (primary) hypertension: Secondary | ICD-10-CM | POA: Diagnosis not present

## 2018-05-12 DIAGNOSIS — K635 Polyp of colon: Secondary | ICD-10-CM | POA: Insufficient documentation

## 2018-05-12 DIAGNOSIS — K5900 Constipation, unspecified: Secondary | ICD-10-CM

## 2018-05-12 DIAGNOSIS — D122 Benign neoplasm of ascending colon: Secondary | ICD-10-CM

## 2018-05-12 DIAGNOSIS — D120 Benign neoplasm of cecum: Secondary | ICD-10-CM

## 2018-05-12 DIAGNOSIS — E785 Hyperlipidemia, unspecified: Secondary | ICD-10-CM | POA: Diagnosis not present

## 2018-05-12 DIAGNOSIS — K5730 Diverticulosis of large intestine without perforation or abscess without bleeding: Secondary | ICD-10-CM | POA: Diagnosis not present

## 2018-05-12 DIAGNOSIS — Z7982 Long term (current) use of aspirin: Secondary | ICD-10-CM | POA: Diagnosis not present

## 2018-05-12 DIAGNOSIS — K319 Disease of stomach and duodenum, unspecified: Secondary | ICD-10-CM | POA: Diagnosis not present

## 2018-05-12 DIAGNOSIS — Z79899 Other long term (current) drug therapy: Secondary | ICD-10-CM | POA: Diagnosis not present

## 2018-05-12 DIAGNOSIS — K219 Gastro-esophageal reflux disease without esophagitis: Secondary | ICD-10-CM | POA: Insufficient documentation

## 2018-05-12 DIAGNOSIS — K5790 Diverticulosis of intestine, part unspecified, without perforation or abscess without bleeding: Secondary | ICD-10-CM | POA: Diagnosis not present

## 2018-05-12 DIAGNOSIS — K921 Melena: Secondary | ICD-10-CM

## 2018-05-12 HISTORY — PX: ESOPHAGOGASTRODUODENOSCOPY (EGD) WITH PROPOFOL: SHX5813

## 2018-05-12 HISTORY — PX: COLONOSCOPY WITH PROPOFOL: SHX5780

## 2018-05-12 SURGERY — COLONOSCOPY WITH PROPOFOL
Anesthesia: General

## 2018-05-12 MED ORDER — PROPOFOL 10 MG/ML IV BOLUS
INTRAVENOUS | Status: DC | PRN
  Administered 2018-05-12: 30 mg via INTRAVENOUS
  Administered 2018-05-12 (×2): 20 mg via INTRAVENOUS

## 2018-05-12 MED ORDER — SODIUM CHLORIDE 0.9 % IV SOLN
INTRAVENOUS | Status: DC
  Administered 2018-05-12: 1000 mL via INTRAVENOUS
  Administered 2018-05-12: 11:00:00 via INTRAVENOUS

## 2018-05-12 MED ORDER — FENTANYL CITRATE (PF) 100 MCG/2ML IJ SOLN
INTRAMUSCULAR | Status: DC | PRN
  Administered 2018-05-12 (×2): 50 ug via INTRAVENOUS

## 2018-05-12 MED ORDER — LIDOCAINE HCL (PF) 2 % IJ SOLN
INTRAMUSCULAR | Status: AC
  Filled 2018-05-12: qty 10

## 2018-05-12 MED ORDER — FENTANYL CITRATE (PF) 100 MCG/2ML IJ SOLN
INTRAMUSCULAR | Status: AC
  Filled 2018-05-12: qty 2

## 2018-05-12 MED ORDER — GLYCOPYRROLATE 0.2 MG/ML IJ SOLN
INTRAMUSCULAR | Status: AC
  Filled 2018-05-12: qty 1

## 2018-05-12 MED ORDER — MIDAZOLAM HCL 2 MG/2ML IJ SOLN
INTRAMUSCULAR | Status: AC
  Filled 2018-05-12: qty 2

## 2018-05-12 MED ORDER — PROPOFOL 500 MG/50ML IV EMUL
INTRAVENOUS | Status: DC | PRN
  Administered 2018-05-12: 50 ug/kg/min via INTRAVENOUS

## 2018-05-12 MED ORDER — PROPOFOL 500 MG/50ML IV EMUL
INTRAVENOUS | Status: AC
  Filled 2018-05-12: qty 50

## 2018-05-12 MED ORDER — LIDOCAINE HCL (PF) 2 % IJ SOLN
INTRAMUSCULAR | Status: DC | PRN
  Administered 2018-05-12: 100 mg

## 2018-05-12 MED ORDER — MIDAZOLAM HCL 5 MG/5ML IJ SOLN
INTRAMUSCULAR | Status: DC | PRN
  Administered 2018-05-12: 2 mg via INTRAVENOUS

## 2018-05-12 MED ORDER — GLYCOPYRROLATE 0.2 MG/ML IJ SOLN
INTRAMUSCULAR | Status: DC | PRN
  Administered 2018-05-12: 0.2 mg via INTRAVENOUS

## 2018-05-12 NOTE — H&P (Signed)
Derrick Bellows, MD [address] [place], [place], [place], [address] [address], [place], [place], [place], [address] Phone: [phone]  Fax: [phone]  Primary Care Physician:  Valerie Roys, DO   Pre-Procedure History & Physical: HPI:  Derrick Sandoval. is a 62 y.o. male is here for an endoscopy and colonoscopy    Past Medical History:  Diagnosis Date  . Allergy   . GERD (gastroesophageal reflux disease)   . Hyperlipidemia   . Hypertension   . Meningitis due to viruses 1968    Past Surgical History:  Procedure Laterality Date  . JOINT REPLACEMENT Bilateral   . NASAL SINUS SURGERY    . TOTAL KNEE ARTHROPLASTY      Prior to Admission medications   Medication Sig Start Date End Date Taking? Authorizing Provider  acyclovir (ZOVIRAX) 400 MG tablet Take 1 tablet (400 mg total) by mouth 2 (two) times daily. 04/21/18  Yes Johnson, Megan P, DO  Ascorbic Acid (VITAMIN C) 1000 MG tablet Take 1,000 mg by mouth daily.   Yes [provider]  aspirin EC 81 MG tablet Take 81 mg by mouth daily.   Yes [provider]  cholecalciferol (VITAMIN D) 1000 UNITS tablet Take 2,000 Units by mouth daily.    Yes [provider]  finasteride (PROSCAR) 5 MG tablet Take 1 tablet (5 mg total) by mouth daily. 04/21/18  Yes Johnson, Megan P, DO  fluticasone (FLONASE) 50 MCG/ACT nasal spray Place 1 spray into both nostrils 2 (two) times daily. 12/12/17  Yes Johnson, Megan P, DO  guaiFENesin (MUCINEX) 600 MG 12 hr tablet Take by mouth 2 (two) times daily.   Yes [provider]  lactulose (CEPHULAC) 10 g packet Take 1 packet (10 g total) by mouth daily. 05/10/18 06/09/18 Yes Derrick Bellows, MD  lisinopril (PRINIVIL,ZESTRIL) 20 MG tablet Take 1 tablet (20 mg total) by mouth daily. 04/21/18  Yes Johnson, Megan P, DO  montelukast (SINGULAIR) 10 MG tablet Take 1 tablet (10 mg total) by mouth at bedtime. 09/09/17  Yes Johnson, Megan P, DO  Multiple  Vitamins-Minerals (MULTIVITAMIN WITH MINERALS) tablet Take 1 tablet by mouth daily.   Yes [provider]  nystatin cream (MYCOSTATIN) APPLY CREAM TOPICALLY TWICE DAILY 05/08/18  Yes Johnson, Megan P, DO  omeprazole (PRILOSEC) 40 MG capsule TAKE 1 CAPSULE BY MOUTH ONCE DAILY 04/17/18  Yes Johnson, Megan P, DO  ranitidine (ZANTAC) 300 MG tablet Take 1 tablet (300 mg total) by mouth at bedtime. 04/21/18  Yes Johnson, Megan P, DO  tamsulosin (FLOMAX) 0.4 MG CAPS capsule Take 1 capsule (0.4 mg total) by mouth 2 (two) times daily. 04/21/18  Yes Johnson, Megan P, DO  triamcinolone cream (KENALOG) 0.1 % Apply 1 application topically 2 (two) times daily.   Yes [provider]  ciprofloxacin (CIPRO) 500 MG tablet Take 1 tablet (500 mg total) by mouth 2 (two) times daily. Patient not taking: Reported on 04/28/2018 04/21/18   Park Liter P, DO  nitrofurantoin, macrocrystal-monohydrate, (MACROBID) 100 MG capsule Take 1 capsule (100 mg total) by mouth 2 (two) times daily. Patient not taking: Reported on 05/12/2018 04/24/18   Park Liter P, DO    Allergies as of 04/28/2018  . (No Known Allergies)    Family History  Problem Relation Age of Onset  . Breast cancer Mother   . Cancer Father        unsure, passed away before it was  confirmed  . Pancreatic cancer Daughter   . Diabetes Neg Hx   . Heart disease Neg Hx   . Hypertension Neg Hx   . Stroke Neg Hx   . COPD Neg Hx   . Colon cancer Neg Hx     Social History   Socioeconomic History  . Marital status: Married    Spouse name: Not on file  . Number of children: Not on file  . Years of education: 10th grade, GED  . Highest education level: GED or equivalent  Occupational History  . Occupation: disabled   Social Needs  . Financial resource strain: Not hard at all  . Food insecurity:    Worry: Never true    Inability: Never true  . Transportation needs:    Medical: No    Non-medical: No  Tobacco Use  . Smoking status: Never  Smoker  . Smokeless tobacco: Never Used  Substance and Sexual Activity  . Alcohol use: Not Currently    Alcohol/week: 0.0 standard drinks    Comment: occasionally, twice a year   . Drug use: No  . Sexual activity: Yes  Lifestyle  . Physical activity:    Days per week: 0 days    Minutes per session: 0 min  . Stress: Not at all  Relationships  . Social connections:    Talks on phone: More than three times a week    Gets together: More than three times a week    Attends religious service: Never    Active member of club or organization: No    Attends meetings of clubs or organizations: Never    Relationship status: Married  . Intimate partner violence:    Fear of current or ex partner: No    Emotionally abused: No    Physically abused: No    Forced sexual activity: No  Other Topics Concern  . Not on file  Social History Narrative   [employer] 3x a week     Review of Systems: See HPI, otherwise negative ROS  Physical Exam: BP (!) 132/92   Pulse 67   Temp 97.8 F (36.6 C) (Tympanic)   Resp 18   Ht 5\' 8"  (1.727 m)   Wt 117.9 kg   SpO2 97%   BMI 39.53 kg/m  General:   Alert,  pleasant and cooperative in NAD Head:  Normocephalic and atraumatic. Neck:  Supple; no masses or thyromegaly. Lungs:  Clear throughout to auscultation, normal respiratory effort.    Heart:  +S1, +S2, Regular rate and rhythm, No edema. Abdomen:  Soft, nontender and nondistended. Normal bowel sounds, without guarding, and without rebound.   Neurologic:  Alert and  oriented x4;  grossly normal neurologically.  Impression/Plan: Derrick Sandoval. is here for an endoscopy and colonoscopy  to be performed for  evaluation of melena    Risks, benefits, limitations, and alternatives regarding endoscopy have been reviewed with the patient.  Questions have been answered.  All parties agreeable.   Derrick Bellows, MD  05/12/2018, 10:35 AM

## 2018-05-12 NOTE — Anesthesia Postprocedure Evaluation (Signed)
Anesthesia Post Note  Patient: Derrick Sandoval.  Procedure(s) Performed: COLONOSCOPY WITH PROPOFOL (N/A ) ESOPHAGOGASTRODUODENOSCOPY (EGD) WITH PROPOFOL (N/A )  Patient location during evaluation: Endoscopy Anesthesia Type: General Level of consciousness: awake and alert Pain management: pain level controlled Vital Signs Assessment: post-procedure vital signs reviewed and stable Respiratory status: spontaneous breathing, nonlabored ventilation, respiratory function stable and patient connected to nasal cannula oxygen Cardiovascular status: blood pressure returned to baseline and stable Postop Assessment: no apparent nausea or vomiting Anesthetic complications: no     Last Vitals:  Vitals:   05/12/18 1131 05/12/18 1141  BP: 126/73 (!) 138/94  Pulse: 70   Resp: 18   Temp:    SpO2: 95%     Last Pain:  Vitals:   05/12/18 1141  TempSrc:   PainSc: 0-No pain                 Toluwani Ruder S

## 2018-05-12 NOTE — H&P (View-Only) (Signed)
Derrick Bellows, MD [address] [place], [place], [place], [address] [address] [place] [address], [place], [place], [place], [address] Phone: [phone]  Fax: [phone]  Primary Care Physician:  Derrick Roys, DO   Pre-Procedure History & Physical: HPI:  Derrick Sandoval. is a 62 y.o. male is here for an endoscopy and colonoscopy    Past Medical History:  Diagnosis Date  . Allergy   . GERD (gastroesophageal reflux disease)   . Hyperlipidemia   . Hypertension   . Meningitis due to viruses 1968    Past Surgical History:  Procedure Laterality Date  . JOINT REPLACEMENT Bilateral   . NASAL SINUS SURGERY    . TOTAL KNEE ARTHROPLASTY      Prior to Admission medications   Medication Sig Start Date End Date Taking? Authorizing Provider  acyclovir (ZOVIRAX) 400 MG tablet Take 1 tablet (400 mg total) by mouth 2 (two) times daily. 04/21/18  Yes Johnson, Megan P, DO  Ascorbic Acid (VITAMIN C) 1000 MG tablet Take 1,000 mg by mouth daily.   Yes [provider]  aspirin EC 81 MG tablet Take 81 mg by mouth daily.   Yes [provider]  cholecalciferol (VITAMIN D) 1000 UNITS tablet Take 2,000 Units by mouth daily.    Yes [provider]  finasteride (PROSCAR) 5 MG tablet Take 1 tablet (5 mg total) by mouth daily. 04/21/18  Yes Johnson, Megan P, DO  fluticasone (FLONASE) 50 MCG/ACT nasal spray Place 1 spray into both nostrils 2 (two) times daily. 12/12/17  Yes Johnson, Megan P, DO  guaiFENesin (MUCINEX) 600 MG 12 hr tablet Take by mouth 2 (two) times daily.   Yes [provider]  lactulose (CEPHULAC) 10 g packet Take 1 packet (10 g total) by mouth daily. 05/10/18 06/09/18 Yes Derrick Bellows, MD  lisinopril (PRINIVIL,ZESTRIL) 20 MG tablet Take 1 tablet (20 mg total) by mouth daily. 04/21/18  Yes Johnson, Megan P, DO  montelukast (SINGULAIR) 10 MG tablet Take 1 tablet (10 mg total) by mouth at bedtime. 09/09/17  Yes Johnson, Megan P, DO  Multiple  Vitamins-Minerals (MULTIVITAMIN WITH MINERALS) tablet Take 1 tablet by mouth daily.   Yes [provider]  nystatin cream (MYCOSTATIN) APPLY CREAM TOPICALLY TWICE DAILY 05/08/18  Yes Johnson, Megan P, DO  omeprazole (PRILOSEC) 40 MG capsule TAKE 1 CAPSULE BY MOUTH ONCE DAILY 04/17/18  Yes Johnson, Megan P, DO  ranitidine (ZANTAC) 300 MG tablet Take 1 tablet (300 mg total) by mouth at bedtime. 04/21/18  Yes Johnson, Megan P, DO  tamsulosin (FLOMAX) 0.4 MG CAPS capsule Take 1 capsule (0.4 mg total) by mouth 2 (two) times daily. 04/21/18  Yes Johnson, Megan P, DO  triamcinolone cream (KENALOG) 0.1 % Apply 1 application topically 2 (two) times daily.   Yes [provider]  ciprofloxacin (CIPRO) 500 MG tablet Take 1 tablet (500 mg total) by mouth 2 (two) times daily. Patient not taking: Reported on 04/28/2018 04/21/18   Park Liter P, DO  nitrofurantoin, macrocrystal-monohydrate, (MACROBID) 100 MG capsule Take 1 capsule (100 mg total) by mouth 2 (two) times daily. Patient not taking: Reported on 05/12/2018 04/24/18   Park Liter P, DO    Allergies as of 04/28/2018  . (No Known Allergies)    Family History  Problem Relation Age of Onset  . Breast cancer Mother   . Cancer Father        unsure, passed away before it was  confirmed  . Pancreatic cancer Daughter   . Diabetes Neg Hx   . Heart disease Neg Hx   . Hypertension Neg Hx   . Stroke Neg Hx   . COPD Neg Hx   . Colon cancer Neg Hx     Social History   Socioeconomic History  . Marital status: Married    Spouse name: Not on file  . Number of children: Not on file  . Years of education: 10th grade, GED  . Highest education level: GED or equivalent  Occupational History  . Occupation: disabled   Social Needs  . Financial resource strain: Not hard at all  . Food insecurity:    Worry: Never true    Inability: Never true  . Transportation needs:    Medical: No    Non-medical: No  Tobacco Use  . Smoking status: Never  Smoker  . Smokeless tobacco: Never Used  Substance and Sexual Activity  . Alcohol use: Not Currently    Alcohol/week: 0.0 standard drinks    Comment: occasionally, twice a year   . Drug use: No  . Sexual activity: Yes  Lifestyle  . Physical activity:    Days per week: 0 days    Minutes per session: 0 min  . Stress: Not at all  Relationships  . Social connections:    Talks on phone: More than three times a week    Gets together: More than three times a week    Attends religious service: Never    Active member of club or organization: No    Attends meetings of clubs or organizations: Never    Relationship status: Married  . Intimate partner violence:    Fear of current or ex partner: No    Emotionally abused: No    Physically abused: No    Forced sexual activity: No  Other Topics Concern  . Not on file  Social History Narrative   [employer] 3x a week     Review of Systems: See HPI, otherwise negative ROS  Physical Exam: BP (!) 132/92   Pulse 67   Temp 97.8 F (36.6 C) (Tympanic)   Resp 18   Ht 5\' 8"  (1.727 m)   Wt 117.9 kg   SpO2 97%   BMI 39.53 kg/m  General:   Alert,  pleasant and cooperative in NAD Head:  Normocephalic and atraumatic. Neck:  Supple; no masses or thyromegaly. Lungs:  Clear throughout to auscultation, normal respiratory effort.    Heart:  +S1, +S2, Regular rate and rhythm, No edema. Abdomen:  Soft, nontender and nondistended. Normal bowel sounds, without guarding, and without rebound.   Neurologic:  Alert and  oriented x4;  grossly normal neurologically.  Impression/Plan: Derrick Sandoval. is here for an endoscopy and colonoscopy  to be performed for  evaluation of melena    Risks, benefits, limitations, and alternatives regarding endoscopy have been reviewed with the patient.  Questions have been answered.  All parties agreeable.   Derrick Bellows, MD  05/12/2018, 10:35 AM

## 2018-05-12 NOTE — Op Note (Signed)
[facility] Gastroenterology Patient Name: Derrick Sandoval Procedure Date: 05/12/2018 10:43 AM MRN: [id number] Account #: [id number] Date of Birth: August 11, 1956 Admit Type: Outpatient Age: 62 Room: [facility] ENDO ROOM 4 Gender: Male Note Status: Finalized Procedure:            Upper GI endoscopy Indications:          Melena Providers:            Jonathon Bellows MD, MD Referring MD:         Valerie Roys (Referring MD) Medicines:            Monitored Anesthesia Care Complications:        No immediate complications. Procedure:            Pre-Anesthesia Assessment:                       - Prior to the procedure, a History and Physical was                        performed, and patient medications, allergies and                        sensitivities were reviewed. The patient's tolerance of                        previous anesthesia was reviewed.                       - The risks and benefits of the procedure and the                        sedation options and risks were discussed with the                        patient. All questions were answered and informed                        consent was obtained.                       - ASA Grade Assessment: II - A patient with mild                        systemic disease.                       After obtaining informed consent, the endoscope was                        passed under direct vision. Throughout the procedure,                        the patient's blood pressure, pulse, and oxygen                        saturations were monitored continuously. The Endoscope                        was introduced through the mouth, and advanced to the  third part of duodenum. The upper GI endoscopy was                        accomplished with ease. The patient tolerated the                        procedure well. Findings:      The esophagus was normal.      The examined duodenum was normal.      Patchy mild inflammation  characterized by congestion (edema) and       erythema was found in the gastric antrum. Biopsies were taken with a       cold forceps for histology.      A large, submucosal mass with no bleeding and no stigmata of recent       bleeding was found in the gastric antrum.      The cardia and gastric fundus were normal on retroflexion. Impression:           - Normal esophagus.                       - Normal examined duodenum.                       - Gastritis. Biopsied.                       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 Recommendation:       - Await pathology results.                       - Perform an upper endoscopic ultrasound (UEUS) in 4                        weeks.                       - Perform a colonoscopy today. Procedure Code(s):    --- Professional ---                       [phone], Esophagogastroduodenoscopy, flexible, transoral;                        with biopsy, single or multiple Diagnosis Code(s):    --- Professional ---                       K29.70, Gastritis, unspecified, without bleeding                       D49.0, Neoplasm of unspecified behavior of digestive                        system                       K92.1, Melena (includes Hematochezia) CPT copyright 2017 American Medical Association. All rights reserved. The codes documented in this report are preliminary and upon coder review may  be revised to meet current compliance requirements. Jonathon Bellows, MD Jonathon Bellows MD, MD 05/12/2018 10:57:35 AM This report has been signed electronically. Number of Addenda: 0 Note Initiated On: 05/12/2018 10:43 AM      [facility]

## 2018-05-12 NOTE — Transfer of Care (Signed)
Immediate Anesthesia Transfer of Care Note  Patient: Derrick Sandoval.  Procedure(s) Performed: COLONOSCOPY WITH PROPOFOL (N/A ) ESOPHAGOGASTRODUODENOSCOPY (EGD) WITH PROPOFOL (N/A )  Patient Location: PACU  Anesthesia Type:General  Level of Consciousness: sedated  Airway & Oxygen Therapy: Patient Spontanous Breathing  Post-op Assessment: Report given to RN and Post -op Vital signs reviewed and stable  Post vital signs: Reviewed and stable  Last Vitals:  Vitals Value Taken Time  BP    Temp    Pulse    Resp    SpO2      Last Pain:  Vitals:   05/12/18 0853  TempSrc: Tympanic  PainSc: 0-No pain         Complications: No apparent anesthesia complications

## 2018-05-12 NOTE — Anesthesia Post-op Follow-up Note (Signed)
Anesthesia QCDR form completed.        

## 2018-05-12 NOTE — Anesthesia Preprocedure Evaluation (Signed)
Anesthesia Evaluation  Patient identified by MRN, date of birth, ID band Patient awake    Reviewed: Allergy & Precautions, NPO status , Patient's Chart, lab work & pertinent test results, reviewed documented beta blocker date and time   Airway Mallampati: III  TM Distance: >3 FB     Dental  (+) Chipped   Pulmonary           Cardiovascular hypertension, Pt. on medications      Neuro/Psych    GI/Hepatic GERD  ,  Endo/Other  Morbid obesity  Renal/GU      Musculoskeletal  (+) Arthritis ,   Abdominal   Peds  Hematology   Anesthesia Other Findings   Reproductive/Obstetrics                             Anesthesia Physical Anesthesia Plan  ASA: III  Anesthesia Plan: General   Post-op Pain Management:    Induction: Intravenous  PONV Risk Score and Plan:   Airway Management Planned:   Additional Equipment:   Intra-op Plan:   Post-operative Plan:   Informed Consent: I have reviewed the patients History and Physical, chart, labs and discussed the procedure including the risks, benefits and alternatives for the proposed anesthesia with the patient or authorized representative who has indicated his/her understanding and acceptance.     Plan Discussed with: CRNA  Anesthesia Plan Comments:         Anesthesia Quick Evaluation

## 2018-05-12 NOTE — Op Note (Signed)
[facility] Gastroenterology Patient Name: Derrick Sandoval Procedure Date: 05/12/2018 10:42 AM MRN: [id number] Account #: [id number] Date of Birth: 1955/11/05 Admit Type: Outpatient Age: 62 Room: [facility] ENDO ROOM 4 Gender: Male Note Status: Finalized Procedure:            Colonoscopy Indications:          Melena Providers:            Jonathon Bellows MD, MD Referring MD:         Valerie Roys (Referring MD) Medicines:            Monitored Anesthesia Care Complications:        No immediate complications. Procedure:            Pre-Anesthesia Assessment:                       - Prior to the procedure, a History and Physical was                        performed, and patient medications, allergies and                        sensitivities were reviewed. The patient's tolerance of                        previous anesthesia was reviewed.                       - The risks and benefits of the procedure and the                        sedation options and risks were discussed with the                        patient. All questions were answered and informed                        consent was obtained.                       - ASA Grade Assessment: II - A patient with mild                        systemic disease.                       After obtaining informed consent, the colonoscope was                        passed under direct vision. Throughout the procedure,                        the patient's blood pressure, pulse, and oxygen                        saturations were monitored continuously. The                        Colonoscope was introduced through the anus and                        advanced to the  the cecum, identified by the                        appendiceal orifice, IC valve and transillumination.                        The colonoscopy was performed with ease. The patient                        tolerated the procedure well. The quality of the bowel   preparation was good. Findings:      The perianal and digital rectal examinations were normal.      A 3 mm polyp was found in the cecum. The polyp was sessile. The polyp       was removed with a cold biopsy forceps. Resection and retrieval were       complete.      A 7 mm polyp was found in the ascending colon. The polyp was sessile.       The polyp was removed with a cold snare. Resection and retrieval were       complete.      Multiple medium-mouthed diverticula were found in the sigmoid colon.      The exam was otherwise without abnormality on direct and retroflexion       views. Impression:           - One 3 mm polyp in the cecum, removed with a cold                        biopsy forceps. Resected and retrieved.                       - One 7 mm polyp in the ascending colon, removed with a                        cold snare. Resected and retrieved.                       - Diverticulosis in the sigmoid colon.                       - The examination was otherwise normal on direct and                        retroflexion views. Recommendation:       - Discharge patient to home (with escort).                       - Resume previous diet.                       - Continue present medications.                       - Await pathology results.                       - Repeat colonoscopy in 5-10 years for surveillance                        based on pathology results. Procedure Code(s):    --- Professional ---  45385, Colonoscopy, flexible; with removal of tumor(s),                        polyp(s), or other lesion(s) by snare technique                       45380, 59, Colonoscopy, flexible; with biopsy, single                        or multiple Diagnosis Code(s):    --- Professional ---                       D12.0, Benign neoplasm of cecum                       D12.2, Benign neoplasm of ascending colon                       K92.1, Melena (includes Hematochezia)                        K57.30, Diverticulosis of large intestine without                        perforation or abscess without bleeding CPT copyright 2017 American Medical Association. All rights reserved. The codes documented in this report are preliminary and upon coder review may  be revised to meet current compliance requirements. Jonathon Bellows, MD Jonathon Bellows MD, MD 05/12/2018 11:19:33 AM This report has been signed electronically. Number of Addenda: 0 Note Initiated On: 05/12/2018 10:42 AM Scope Withdrawal Time: 0 hours 13 minutes 46 seconds  Total Procedure Duration: 0 hours 18 minutes 38 seconds       [facility]

## 2018-05-15 ENCOUNTER — Telehealth: Payer: Self-pay | Admitting: Gastroenterology

## 2018-05-15 ENCOUNTER — Encounter: Payer: Self-pay | Admitting: Gastroenterology

## 2018-05-15 ENCOUNTER — Other Ambulatory Visit: Payer: Self-pay

## 2018-05-15 ENCOUNTER — Telehealth: Payer: Self-pay

## 2018-05-15 DIAGNOSIS — K921 Melena: Secondary | ICD-10-CM

## 2018-05-15 DIAGNOSIS — K5900 Constipation, unspecified: Secondary | ICD-10-CM

## 2018-05-15 NOTE — Telephone Encounter (Signed)
Patient called in & and would like to go over their results from  the colonoscopy done 05-12-18 by Dr Lelon Frohlich. Please call.

## 2018-05-15 NOTE — Telephone Encounter (Signed)
Spoke with pt regarding submucosal mass and Dr. Georgeann Oppenheim instructions for pt to be referred to Dr. Rush Landmark for further evaluation. Pt is aware he will be contacted by Dr. Donneta Romberg office to schedule. [name]

## 2018-05-15 NOTE — Telephone Encounter (Signed)
-----   Message from Irving Copas., MD sent at 05/12/2018  5:13 PM EDT ----- Regarding: RE: please arrange appointment  [name], Thank you for the referral. Yaroslav Gombos, can you work with [name] and patient can have EUS Radial +/- Linear with FNB with myself or Dan in the next few weeks. Thank you all. Gabe ----- Message ----- From: Jonathon Bellows, MD Sent: 05/12/2018  11:27 AM EDT To: Irving Copas., MD, # Subject: please arrange appointment                     Jadijah  Please arrange referral to Dr Rush Landmark for EUS for the stomach to evaluate gastric submucosal mass seen on EGD  C/c Dr Rush Landmark   Regards    Dr Jonathon Bellows  Gastroenterology/Hepatology Pager: [phone]

## 2018-05-15 NOTE — Telephone Encounter (Signed)
-----   Message from Irving Copas., MD sent at 05/12/2018  5:13 PM EDT ----- Regarding: RE: please arrange appointment  [name], Thank you for the referral. Patty, can you work with [name] and patient can have EUS Radial +/- Linear with FNB with myself or Dan in the next few weeks. Thank you all. Gabe ----- Message ----- From: Derrick Bellows, MD Sent: 05/12/2018  11:27 AM EDT To: Irving Copas., MD, # Subject: please arrange appointment                     [name]  Please arrange referral to Dr Rush Landmark for EUS for the stomach to evaluate gastric submucosal mass seen on EGD  C/c Dr Rush Landmark   Regards    Dr Derrick Sandoval  Gastroenterology/Hepatology Pager: [phone]

## 2018-05-16 ENCOUNTER — Telehealth: Payer: Self-pay

## 2018-05-16 ENCOUNTER — Other Ambulatory Visit: Payer: Self-pay

## 2018-05-16 DIAGNOSIS — K3189 Other diseases of stomach and duodenum: Secondary | ICD-10-CM

## 2018-05-16 LAB — SURGICAL PATHOLOGY

## 2018-05-16 NOTE — Telephone Encounter (Signed)
The note was closed by CMA, I will work on this today.

## 2018-05-16 NOTE — Telephone Encounter (Signed)
Regarding: RE: please arrange appointment  [name], Thank you for the referral. Darrel Baroni, can you work with [name] and patient can have EUS Radial +/- Linear with FNB with myself or Dan in the next few weeks. Thank you all. Gabe ----- Message ----- From: Jonathon Bellows, MD Sent: 05/12/2018  11:27 AM EDT To: Irving Copas., MD, # Subject: please arrange appointment                     Jadijah  Please arrange referral to Dr Rush Landmark for EUS for the stomach to evaluate gastric submucosal mass seen on EGD  C/c Dr Rush Landmark   Regards

## 2018-05-16 NOTE — Telephone Encounter (Signed)
EUS with Dr Ardis Hughs on 10/17 at 115 am

## 2018-05-16 NOTE — Telephone Encounter (Signed)
EUS scheduled, pt instructed and medications reviewed.  Patient instructions mailed to home.  Patient to call with any questions or concerns.  

## 2018-05-17 ENCOUNTER — Telehealth: Payer: Self-pay | Admitting: Gastroenterology

## 2018-05-17 NOTE — Telephone Encounter (Signed)
Patient called today wanting his results from 04-22-18 Endoscopy completed by Dr Vicente Males. Please call patient.

## 2018-05-18 ENCOUNTER — Telehealth: Payer: Self-pay | Admitting: Gastroenterology

## 2018-05-18 NOTE — Telephone Encounter (Signed)
Pt is calling for  Biopsy results

## 2018-05-19 NOTE — Telephone Encounter (Signed)
Inform   1. Stomach bx shows gastritis 2. One of the polyps is an adenoma repeat in 5 years colonoscopy

## 2018-05-22 ENCOUNTER — Encounter: Payer: Self-pay | Admitting: Family Medicine

## 2018-05-22 ENCOUNTER — Ambulatory Visit (INDEPENDENT_AMBULATORY_CARE_PROVIDER_SITE_OTHER): Payer: PPO | Admitting: Family Medicine

## 2018-05-22 VITALS — BP 145/94 | HR 76 | Temp 99.8°F | Ht 67.0 in | Wt 280.4 lb

## 2018-05-22 DIAGNOSIS — J40 Bronchitis, not specified as acute or chronic: Secondary | ICD-10-CM

## 2018-05-22 DIAGNOSIS — R509 Fever, unspecified: Secondary | ICD-10-CM | POA: Diagnosis not present

## 2018-05-22 LAB — VERITOR FLU A/B WAIVED
INFLUENZA A: NEGATIVE
Influenza B: NEGATIVE

## 2018-05-22 MED ORDER — PREDNISONE 10 MG PO TABS: ORAL_TABLET | ORAL | 0 refills | Status: DC

## 2018-05-22 MED ORDER — HYDROCOD POLST-CPM POLST ER 10-8 MG/5ML PO SUER: 5.0000 mL | Freq: Two times a day (BID) | ORAL | 0 refills | Status: DC | PRN

## 2018-05-22 NOTE — Progress Notes (Signed)
   BP (!) 145/94 (BP Location: Left Arm, Patient Position: Sitting, Cuff Size: Normal)   Pulse 76   Temp 99.8 F (37.7 C)   Ht 5\' 7"  (1.702 m)   Wt 280 lb 6 oz (127.2 kg)   SpO2 95%   BMI 43.91 kg/m    Subjective:    Patient ID: Derrick Sandoval., male    DOB: 02-02-1956, 62 y.o.   MRN: [id number]  HPI: Amontae Ng. is a 62 y.o. male  Chief Complaint  Patient presents with  . URI    cough, nasal and chest congestion, fever   Almost a week of sore throat, fevers, headache, sweats, dry hacking cough, fatigue, body aches. Taking nyquil, tylenol, ibuprofen. Several sick contacts. No known pulmonary dz.   Relevant past medical, surgical, family and social history reviewed and updated as indicated. Interim medical history since our last visit reviewed. Allergies and medications reviewed and updated.  Review of Systems  Per HPI unless specifically indicated above     Objective:    BP (!) 145/94 (BP Location: Left Arm, Patient Position: Sitting, Cuff Size: Normal)   Pulse 76   Temp 99.8 F (37.7 C)   Ht 5\' 7"  (1.702 m)   Wt 280 lb 6 oz (127.2 kg)   SpO2 95%   BMI 43.91 kg/m   Wt Readings from Last 3 Encounters:  05/22/18 280 lb 6 oz (127.2 kg)  05/12/18 260 lb (117.9 kg)  04/28/18 280 lb 9.6 oz (127.3 kg)    Physical Exam  Constitutional: He is oriented to person, place, and time. He appears well-developed and well-nourished. No distress.  HENT:  Head: Atraumatic.  Right Ear: External ear normal.  Left Ear: External ear normal.  Oropharynx and nasal mucosa erythematous and edematous  Eyes: Conjunctivae and EOM are normal.  Neck: Normal range of motion. Neck supple.  Cardiovascular: Normal rate, regular rhythm and normal heart sounds.  Pulmonary/Chest: Effort normal. No respiratory distress. He has wheezes (mild, diffuse).  Musculoskeletal: Normal range of motion.  Lymphadenopathy:    He has no cervical adenopathy.  Neurological: He is alert and oriented  to person, place, and time.  Skin: Skin is warm and dry.  Psychiatric: He has a normal mood and affect. His behavior is normal.  Nursing note and vitals reviewed.   Results for orders placed or performed in visit on 05/22/18  Veritor Flu A/B Waived  Result Value Ref Range   Influenza A Negative Negative   Influenza B Negative Negative      Assessment & Plan:   Problem List Items Addressed This Visit    None    Visit Diagnoses    Bronchitis    -  Primary   Rapid flu neg, will tx with prednisone, flonase, and tussionex at bedtime. Sedation precautions and supportive care reviewed. F/u if not improving   Relevant Orders   Veritor Flu A/B Waived (Completed)       Follow up plan: Return for as scheduled.

## 2018-05-23 NOTE — Patient Instructions (Signed)
Follow up as scheduled.  

## 2018-05-26 ENCOUNTER — Ambulatory Visit (INDEPENDENT_AMBULATORY_CARE_PROVIDER_SITE_OTHER): Payer: PPO | Admitting: Family Medicine

## 2018-05-26 ENCOUNTER — Encounter: Payer: Self-pay | Admitting: Family Medicine

## 2018-05-26 VITALS — BP 135/87 | HR 81 | Temp 98.0°F | Ht 67.0 in | Wt 283.5 lb

## 2018-05-26 DIAGNOSIS — J069 Acute upper respiratory infection, unspecified: Secondary | ICD-10-CM

## 2018-05-26 MED ORDER — HYDROCOD POLST-CPM POLST ER 10-8 MG/5ML PO SUER: 5.0000 mL | Freq: Two times a day (BID) | ORAL | 0 refills | Status: DC | PRN

## 2018-05-26 MED ORDER — AZITHROMYCIN 250 MG PO TABS: ORAL_TABLET | ORAL | 0 refills | Status: DC

## 2018-05-26 NOTE — Progress Notes (Signed)
   BP 135/87   Pulse 81   Temp 98 F (36.7 C) (Oral)   Ht 5\' 7"  (1.702 m)   Wt 283 lb 8 oz (128.6 kg)   SpO2 96%   BMI 44.40 kg/m    Subjective:    Patient ID: Derrick Ball., male    DOB: 1955/12/09, 62 y.o.   MRN: [id number]  HPI: Derrick Collier. is a 62 y.o. male  Chief Complaint  Patient presents with  . URI    pt states he is no better from Monday's visit, states he is coughing up green phlegm now   Here today with persistent sxs from URI visit 4 days ago. Still having productive cough, fatigue, congestion, sore throat. Took 3 days of prednisone and cough medicine with minimal relief. Denies fever, chills, CP, SOB. Wife now also sick with similar sxs. Non smoker, no hx of pulmonary dz.   Relevant past medical, surgical, family and social history reviewed and updated as indicated. Interim medical history since our last visit reviewed. Allergies and medications reviewed and updated.  Review of Systems  Per HPI unless specifically indicated above     Objective:    BP 135/87   Pulse 81   Temp 98 F (36.7 C) (Oral)   Ht 5\' 7"  (1.702 m)   Wt 283 lb 8 oz (128.6 kg)   SpO2 96%   BMI 44.40 kg/m   Wt Readings from Last 3 Encounters:  05/26/18 283 lb 8 oz (128.6 kg)  05/22/18 280 lb 6 oz (127.2 kg)  05/12/18 260 lb (117.9 kg)    Physical Exam  Constitutional: He is oriented to person, place, and time. He appears well-developed and well-nourished. No distress.  HENT:  Head: Atraumatic.  Right Ear: External ear normal.  Left Ear: External ear normal.  Oropharynx and nasal mucosa erythematous and edematous  Eyes: Conjunctivae and EOM are normal.  Neck: Normal range of motion. Neck supple.  Cardiovascular: Normal rate and regular rhythm.  Pulmonary/Chest: Effort normal and breath sounds normal. No respiratory distress. He has no wheezes.  Musculoskeletal: Normal range of motion.  Neurological: He is alert and oriented to person, place, and time.  Skin: Skin  is warm and dry.  Psychiatric: He has a normal mood and affect. His behavior is normal.  Nursing note and vitals reviewed.   Results for orders placed or performed in visit on 05/22/18  Veritor Flu A/B Waived  Result Value Ref Range   Influenza A Negative Negative   Influenza B Negative Negative      Assessment & Plan:   Problem List Items Addressed This Visit    None    Visit Diagnoses    Upper respiratory tract infection, unspecified type    -  Primary   Tx with zpack, mucinex, and tussionex. Complete remaining prednisone. supportive care and sedation precautions reviewed. F/u if not improving.    Relevant Medications   azithromycin (ZITHROMAX) 250 MG tablet       Follow up plan: Return if symptoms worsen or fail to improve.

## 2018-05-29 ENCOUNTER — Other Ambulatory Visit: Payer: Self-pay

## 2018-05-29 ENCOUNTER — Encounter (HOSPITAL_COMMUNITY): Payer: Self-pay | Admitting: *Deleted

## 2018-05-29 NOTE — Patient Instructions (Signed)
Follow up if not improving

## 2018-05-31 ENCOUNTER — Encounter: Payer: Self-pay | Admitting: Family Medicine

## 2018-05-31 NOTE — Anesthesia Preprocedure Evaluation (Addendum)
Anesthesia Evaluation  Patient identified by MRN, date of birth, ID band Patient awake    Reviewed: Allergy & Precautions, NPO status , Patient's Chart, lab work & pertinent test results  Airway Mallampati: II  TM Distance: >3 FB Neck ROM: Full    Dental no notable dental hx. (+) Teeth Intact, Dental Advisory Given   Pulmonary neg pulmonary ROS,    Pulmonary exam normal breath sounds clear to auscultation       Cardiovascular hypertension, Pt. on medications and Pt. on home beta blockers Normal cardiovascular exam Rhythm:Regular Rate:Normal     Neuro/Psych negative neurological ROS  negative psych ROS   GI/Hepatic Neg liver ROS, GERD  ,  Endo/Other  Morbid obesity  Renal/GU negative Renal ROS     Musculoskeletal  (+) Arthritis ,   Abdominal (+) + obese,   Peds  Hematology negative hematology ROS (+)   Anesthesia Other Findings   Reproductive/Obstetrics                            Anesthesia Physical Anesthesia Plan  ASA: III  Anesthesia Plan: MAC   Post-op Pain Management:    Induction:   PONV Risk Score and Plan: Treatment may vary due to age or medical condition  Airway Management Planned: Natural Airway and Nasal Cannula  Additional Equipment:   Intra-op Plan:   Post-operative Plan:   Informed Consent: I have reviewed the patients History and Physical, chart, labs and discussed the procedure including the risks, benefits and alternatives for the proposed anesthesia with the patient or authorized representative who has indicated his/her understanding and acceptance.   Dental advisory given  Plan Discussed with:   Anesthesia Plan Comments:         Anesthesia Quick Evaluation

## 2018-06-01 ENCOUNTER — Ambulatory Visit (HOSPITAL_COMMUNITY)
Admission: RE | Admit: 2018-06-01 | Discharge: 2018-06-01 | Disposition: A | Discharge status: Home / Self Care | Payer: PPO | Source: Ambulatory Visit | Attending: Gastroenterology | Admitting: Gastroenterology

## 2018-06-01 ENCOUNTER — Other Ambulatory Visit: Payer: Self-pay

## 2018-06-01 ENCOUNTER — Ambulatory Visit (HOSPITAL_COMMUNITY): Payer: PPO | Admitting: Anesthesiology

## 2018-06-01 ENCOUNTER — Encounter: Payer: Self-pay | Admitting: Family Medicine

## 2018-06-01 ENCOUNTER — Encounter (HOSPITAL_COMMUNITY)
Admission: RE | Disposition: A | Discharge status: Home / Self Care | Payer: Self-pay | Source: Ambulatory Visit | Attending: Gastroenterology

## 2018-06-01 ENCOUNTER — Encounter (HOSPITAL_COMMUNITY): Payer: Self-pay | Admitting: Emergency Medicine

## 2018-06-01 DIAGNOSIS — E785 Hyperlipidemia, unspecified: Secondary | ICD-10-CM | POA: Diagnosis not present

## 2018-06-01 DIAGNOSIS — Z96653 Presence of artificial knee joint, bilateral: Secondary | ICD-10-CM | POA: Insufficient documentation

## 2018-06-01 DIAGNOSIS — Z6841 Body Mass Index (BMI) 40.0 and over, adult: Secondary | ICD-10-CM | POA: Diagnosis not present

## 2018-06-01 DIAGNOSIS — K219 Gastro-esophageal reflux disease without esophagitis: Secondary | ICD-10-CM | POA: Insufficient documentation

## 2018-06-01 DIAGNOSIS — M1990 Unspecified osteoarthritis, unspecified site: Secondary | ICD-10-CM | POA: Diagnosis not present

## 2018-06-01 DIAGNOSIS — K921 Melena: Secondary | ICD-10-CM | POA: Insufficient documentation

## 2018-06-01 DIAGNOSIS — I10 Essential (primary) hypertension: Secondary | ICD-10-CM | POA: Insufficient documentation

## 2018-06-01 DIAGNOSIS — Z7982 Long term (current) use of aspirin: Secondary | ICD-10-CM | POA: Diagnosis not present

## 2018-06-01 DIAGNOSIS — K3189 Other diseases of stomach and duodenum: Secondary | ICD-10-CM

## 2018-06-01 DIAGNOSIS — Z79899 Other long term (current) drug therapy: Secondary | ICD-10-CM | POA: Diagnosis not present

## 2018-06-01 HISTORY — PX: ESOPHAGOGASTRODUODENOSCOPY (EGD) WITH PROPOFOL: SHX5813

## 2018-06-01 HISTORY — DX: Cough, unspecified: R05.9

## 2018-06-01 HISTORY — PX: EUS: SHX5427

## 2018-06-01 HISTORY — DX: Cough: R05

## 2018-06-01 SURGERY — UPPER ENDOSCOPIC ULTRASOUND (EUS) RADIAL
Anesthesia: Monitor Anesthesia Care

## 2018-06-01 MED ORDER — SODIUM CHLORIDE 0.9 % IV SOLN: INTRAVENOUS | Status: DC

## 2018-06-01 MED ORDER — PROPOFOL 500 MG/50ML IV EMUL
INTRAVENOUS | Status: DC | PRN
  Administered 2018-06-01: 150 ug/kg/min via INTRAVENOUS

## 2018-06-01 MED ORDER — LACTATED RINGERS IV SOLN
INTRAVENOUS | Status: DC
  Administered 2018-06-01: 10:00:00 via INTRAVENOUS

## 2018-06-01 MED ORDER — PROPOFOL 10 MG/ML IV BOLUS
INTRAVENOUS | Status: DC | PRN
  Administered 2018-06-01 (×2): 20 mg via INTRAVENOUS

## 2018-06-01 MED ORDER — PROPOFOL 10 MG/ML IV BOLUS
INTRAVENOUS | Status: AC
  Filled 2018-06-01: qty 40

## 2018-06-01 MED ORDER — LIDOCAINE 2% (20 MG/ML) 5 ML SYRINGE
INTRAMUSCULAR | Status: DC | PRN
  Administered 2018-06-01: 100 mg via INTRAVENOUS

## 2018-06-01 NOTE — Anesthesia Procedure Notes (Signed)
Procedure Name: MAC Date/Time: 06/01/2018 10:17 AM Performed by: Maxwell Caul, CRNA Pre-anesthesia Checklist: Patient identified, Emergency Drugs available, Suction available and Patient being monitored Patient Re-evaluated:Patient Re-evaluated prior to induction Oxygen Delivery Method: Nasal cannula

## 2018-06-01 NOTE — Op Note (Signed)
[facility] Patient Name: Derrick Sandoval Procedure Date: 06/01/2018 MRN: [id number] Attending MD: Milus Banister , MD Date of Birth: [birth year] CSN: [id number] Age: 62 Admit Type: Outpatient Procedure:                Upper EUS Indications:              Gastric deformity on endoscopy on recent EGD Dr.                            Jonathon Bellows Providers:                Milus Banister, MD, Baird Cancer, RN, Charolette Child, Technician, Virgia Land, CRNA Referring MD:             Jonathon Bellows, MD Medicines:                Monitored Anesthesia Care Complications:            No immediate complications. Estimated blood loss:                            None. Estimated Blood Loss:     Estimated blood loss: none. Procedure:                Pre-Anesthesia Assessment:                           - Prior to the procedure, a History and Physical                            was performed, and patient medications and                            allergies were reviewed. The patient's tolerance of                            previous anesthesia was also reviewed. The risks                            and benefits of the procedure and the sedation                            options and risks were discussed with the patient.                            All questions were answered, and informed consent                            was obtained. Prior Anticoagulants: The patient has                            taken no previous anticoagulant or antiplatelet  agents. ASA Grade Assessment: III - A patient with                            severe systemic disease. After reviewing the risks                            and benefits, the patient was deemed in                            satisfactory condition to undergo the procedure.                           After obtaining informed consent, the endoscope was                            passed under direct  vision. Throughout the                            procedure, the patient's blood pressure, pulse, and                            oxygen saturations were monitored continuously. The                            [id number] ([id number]) Olympus Radial EUS was                            introduced through the mouth, and advanced to the                            second part of duodenum. The upper EUS was                            accomplished without difficulty. The patient                            tolerated the procedure well. Scope In: Scope Out: Findings:      ENDOSCOPIC FINDING: :      1. Medium sized subepithelial lesion in the gastric antrum. The lesion       was soft and the overlying mucosa was somewhat yellow.      2. The UGI tract was otherwise normal.      ENDOSONOGRAPHIC FINDING: :      1. The subepithelial gastric lesion above correlated with an oval       shaped, clearly demarcated, iso to slightly hyperechoic mass involving       the submucosa layer of the antral gastric wall. The lesion measures 2cm       across. The overall endoscopic and ultrasonographic features are       consistent with a lipoma.      2. Limited views of the pancreas, liver, spleen, gallbladder were all       normal. 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                           - The lesion was present but not described on my                            review of his 2018 CT scan of abdomen/pelivs and it                            is essentially unchanged in size since then. Moderate Sedation:      N/A- Per Anesthesia Care Recommendation:           - Discharge patient to home (ambulatory). Procedure Code(s):        --- Professional ---                           [phone], Esophagogastroduodenoscopy, flexible,                            transoral; with endoscopic ultrasound examination                             limited to the esophagus, stomach or duodenum, and                            adjacent structures Diagnosis Code(s):        --- Professional ---                           K31.89, Other diseases of stomach and duodenum CPT copyright 2018 [employer]. All rights reserved. The codes documented in this report are preliminary and upon coder review may  be revised to meet current compliance requirements. Milus Banister, MD 06/01/2018 10:40:30 AM This report has been signed electronically. Number of Addenda: 0

## 2018-06-01 NOTE — Interval H&P Note (Signed)
History and Physical Interval Note:  06/01/2018 9:59 AM  Derrick Sandoval.  has presented today for surgery, with the diagnosis of gastric mass  The various methods of treatment have been discussed with the patient and family. After consideration of risks, benefits and other options for treatment, the patient has consented to  Procedure(s): UPPER ENDOSCOPIC ULTRASOUND (EUS) RADIAL (N/A) as a surgical intervention .  The patient's history has been reviewed, patient examined, no change in status, stable for surgery.  I have reviewed the patient's chart and labs.  Questions were answered to the patient's satisfaction.     Milus Banister

## 2018-06-01 NOTE — Discharge Instructions (Signed)
Esophagogastroduodenoscopy, Care After °Refer to this sheet in the next few weeks. These instructions provide you with information about caring for yourself after your procedure. Your health care provider may also give you more specific instructions. Your treatment has been planned according to current medical practices, but problems sometimes occur. Call your health care provider if you have any problems or questions after your procedure. °What can I expect after the procedure? °After the procedure, it is common to have: °· A sore throat. °· Nausea. °· Bloating. °· Dizziness. °· Fatigue. ° °Follow these instructions at home: °· Do not eat or drink anything until the numbing medicine (local anesthetic) has worn off and your gag reflex has returned. You will know that the local anesthetic has worn off when you can swallow comfortably. °· Do not drive for 24 hours if you received a medicine to help you relax (sedative). °· If your health care provider took a tissue sample for testing during the procedure, make sure to get your test results. This is your responsibility. Ask your health care provider or the department performing the test when your results will be ready. °· Keep all follow-up visits as told by your health care provider. This is important. °Contact a health care provider if: °· You cannot stop coughing. °· You are not urinating. °· You are urinating less than usual. °Get help right away if: °· You have trouble swallowing. °· You cannot eat or drink. °· You have throat or chest pain that gets worse. °· You are dizzy or light-headed. °· You faint. °· You have nausea or vomiting. °· You have chills. °· You have a fever. °· You have severe abdominal pain. °· You have black, tarry, or bloody stools. °This information is not intended to replace advice given to you by your health care provider. Make sure you discuss any questions you have with your health care provider. °Document Released: 07/19/2012 Document  Revised: 01/08/2016 Document Reviewed: 06/26/2015 °Elsevier Interactive Patient Education © 2018 [employer] ° °

## 2018-06-01 NOTE — Anesthesia Postprocedure Evaluation (Signed)
Anesthesia Post Note  Patient: Derrick Sandoval.  Procedure(s) Performed: UPPER ENDOSCOPIC ULTRASOUND (EUS) RADIAL (N/A ) ESOPHAGOGASTRODUODENOSCOPY (EGD) WITH PROPOFOL (N/A )     Patient location during evaluation: Endoscopy Anesthesia Type: MAC Level of consciousness: awake and alert Pain management: pain level controlled Vital Signs Assessment: post-procedure vital signs reviewed and stable Respiratory status: spontaneous breathing, nonlabored ventilation, respiratory function stable and patient connected to nasal cannula oxygen Cardiovascular status: stable and blood pressure returned to baseline Postop Assessment: no apparent nausea or vomiting Anesthetic complications: no    Last Vitals:  Vitals:   06/01/18 1040 06/01/18 1050  BP: 132/86 126/86  Pulse: 83 76  Resp: 16 (!) 23  Temp:    SpO2: 99% 98%    Last Pain:  Vitals:   06/01/18 1050  TempSrc:   PainSc: 0-No pain                 Barnet Glasgow

## 2018-06-01 NOTE — Transfer of Care (Signed)
Immediate Anesthesia Transfer of Care Note  Patient: Derrick Sandoval.  Procedure(s) Performed: UPPER ENDOSCOPIC ULTRASOUND (EUS) RADIAL (N/A )  Patient Location: PACU  Anesthesia Type:MAC  Level of Consciousness: awake, alert  and oriented  Airway & Oxygen Therapy: Patient Spontanous Breathing and Patient connected to nasal cannula oxygen  Post-op Assessment: Report given to RN and Post -op Vital signs reviewed and stable  Post vital signs: Reviewed and stable  Last Vitals:  Vitals Value Taken Time  BP    Temp    Pulse 40 06/01/2018 10:36 AM  Resp 15 06/01/2018 10:36 AM  SpO2 80 % 06/01/2018 10:36 AM  Vitals shown include unvalidated device data.  Last Pain:  Vitals:   06/01/18 0932  TempSrc: Oral  PainSc: 0-No pain         Complications: No apparent anesthesia complications

## 2018-06-02 NOTE — Addendum Note (Signed)
Addendum  created 06/02/18 [id number] by Lollie Sails, CRNA   Charge Capture section accepted

## 2018-06-05 ENCOUNTER — Telehealth: Payer: Self-pay

## 2018-06-05 DIAGNOSIS — R339 Retention of urine, unspecified: Secondary | ICD-10-CM | POA: Diagnosis not present

## 2018-06-05 DIAGNOSIS — N138 Other obstructive and reflux uropathy: Secondary | ICD-10-CM | POA: Diagnosis not present

## 2018-06-05 DIAGNOSIS — N401 Enlarged prostate with lower urinary tract symptoms: Secondary | ICD-10-CM | POA: Diagnosis not present

## 2018-06-05 NOTE — Telephone Encounter (Signed)
Called pt to inform him of EUS results and Dr. Georgeann Oppenheim instructions to follow up at our office as planned. LVM to return call

## 2018-06-07 NOTE — Telephone Encounter (Signed)
Spoke with pt and informed him of EUS results and Dr. Georgeann Oppenheim instructions to follow up as planned.

## 2018-06-08 ENCOUNTER — Telehealth: Payer: Self-pay | Admitting: Gastroenterology

## 2018-06-08 ENCOUNTER — Ambulatory Visit (INDEPENDENT_AMBULATORY_CARE_PROVIDER_SITE_OTHER): Payer: PPO | Admitting: Gastroenterology

## 2018-06-08 ENCOUNTER — Encounter: Payer: Self-pay | Admitting: Gastroenterology

## 2018-06-08 VITALS — BP 118/74 | HR 85 | Ht 67.0 in | Wt 277.4 lb

## 2018-06-08 DIAGNOSIS — K921 Melena: Secondary | ICD-10-CM | POA: Diagnosis not present

## 2018-06-08 DIAGNOSIS — K5900 Constipation, unspecified: Secondary | ICD-10-CM

## 2018-06-08 NOTE — Telephone Encounter (Signed)
Spoke with pt wife, Doroteo Bradford, and explained the directions of taking the Amitiza.

## 2018-06-08 NOTE — Telephone Encounter (Signed)
Patient's wife is calling and Dr Vicente Males gave him sample's(Amitiza) How does he need to take it? Please call & advise [phone].

## 2018-06-08 NOTE — Progress Notes (Signed)
Jonathon Bellows MD, MRCP(U.K) [address]  [place]  [place], [place] [address]  Main: [phone]  Fax: [phone]   Primary Care Physician: Valerie Roys, DO  Primary Gastroenterologist:  Dr. Jonathon Bellows   Chief Complaint  Patient presents with  . Follow-up    Melena, constipation    HPI: Derrick Sandoval. is a 62 y.o. male    Summary of history : He was initially referred and seen on 04/28/18 for black stools.  Labs 04/21/2018 demonstrates a hemoglobin of 15.6 and an MCV of 88.  CMP normal except a mildly elevated ALT at 47 and a glucose of 104.  CT scan of the abdomen in May 2018 demonstrated per rectum mildly distended with stool, no rectal wall thickening.   Interval history   04/28/2018-  06/08/2018  05/12/18 : EGD : gastritis , submucosal mass seen in the gastric antrum, gastritis seen on biopsy  Colonoscopy : 2 polyps resected- 1/2 tubular adenoma   05/22/18 : EUS of gastric mass seen on EGD suggests the mass is a lipoma whichis benign.    No bowel movements last few days . No black colored stools after his procedure. The linzess 290 mcg did work , felt it was too much, got him reduced to 145 mg which also he felt was too strong then we dropped to 72 mcg which didn't work. We tried a few other agents which he said was too expensive.   Miralax not working . Lactulose was very expensive.   On PPI, stopped advil. Abdominal pain has resolved.   Current Outpatient Medications  Medication Sig Dispense Refill  . acyclovir (ZOVIRAX) 400 MG tablet Take 1 tablet (400 mg total) by mouth 2 (two) times daily. 180 tablet 1  . albuterol (PROVENTIL HFA;VENTOLIN HFA) 108 (90 Base) MCG/ACT inhaler Inhale 1-2 puffs into the lungs every 6 (six) hours as needed for wheezing or shortness of breath.    . Ascorbic Acid (VITAMIN C) 1000 MG tablet Take 1,000 mg by mouth at bedtime.     Marland Kitchen aspirin EC 81 MG tablet Take 81 mg by mouth daily.    . cholecalciferol (VITAMIN D) 1000  UNITS tablet Take 1,000 Units by mouth daily.     . finasteride (PROSCAR) 5 MG tablet Take 1 tablet (5 mg total) by mouth daily. (Patient taking differently: Take 5 mg by mouth at bedtime. ) 90 tablet 1  . fluticasone (FLONASE) 50 MCG/ACT nasal spray Place 1 spray into both nostrils 2 (two) times daily. 48 g 12  . lactulose (CEPHULAC) 10 g packet Take 1 packet (10 g total) by mouth daily. 30 each 0  . lisinopril (PRINIVIL,ZESTRIL) 20 MG tablet Take 1 tablet (20 mg total) by mouth daily. (Patient taking differently: Take 20 mg by mouth at bedtime. ) 90 tablet 1  . montelukast (SINGULAIR) 10 MG tablet Take 1 tablet (10 mg total) by mouth at bedtime. 90 tablet 3  . Multiple Vitamins-Minerals (MULTIVITAMIN WITH MINERALS) tablet Take 1 tablet by mouth daily.    Marland Kitchen nystatin cream (MYCOSTATIN) APPLY CREAM TOPICALLY TWICE DAILY (Patient taking differently: Apply 1 application topically daily as needed (for skin irritation/rash.). ) 30 g 6  . omeprazole (PRILOSEC) 40 MG capsule TAKE 1 CAPSULE BY MOUTH ONCE DAILY (Patient taking differently: Take 40 mg by mouth daily. ) 90 capsule 0  . polyethylene glycol powder (GLYCOLAX/MIRALAX) powder Take 17 g by mouth 2 (two) times daily.    . ranitidine (ZANTAC) 300 MG  tablet Take 1 tablet (300 mg total) by mouth at bedtime. 90 tablet 1  . tamsulosin (FLOMAX) 0.4 MG CAPS capsule Take 1 capsule (0.4 mg total) by mouth 2 (two) times daily. 180 capsule 1  . triamcinolone cream (KENALOG) 0.1 % Apply 1 application topically 2 (two) times daily as needed (for skin irritation/rash.).     Marland Kitchen azithromycin (ZITHROMAX) 250 MG tablet Take 2 tabs day one, then 1 tab daily until complete (Patient not taking: Reported on 06/08/2018) 6 tablet 0  . chlorpheniramine-HYDROcodone (TUSSIONEX PENNKINETIC ER) 10-8 MG/5ML SUER Take 5 mLs by mouth every 12 (twelve) hours as needed. (Patient not taking: Reported on 06/08/2018) 50 mL 0  . predniSONE (DELTASONE) 10 MG tablet Take 6 tabs day one, 5  tabs day two, 4 tabs day three, etc (Patient not taking: Reported on 06/08/2018) 21 tablet 0   No current facility-administered medications for this visit.     Allergies as of 06/08/2018  . (No Known Allergies)    ROS:  General: Negative for anorexia, weight loss, fever, chills, fatigue, weakness. ENT: Negative for hoarseness, difficulty swallowing , nasal congestion. CV: Negative for chest pain, angina, palpitations, dyspnea on exertion, peripheral edema.  Respiratory: Negative for dyspnea at rest, dyspnea on exertion, cough, sputum, wheezing.  GI: See history of present illness. GU:  Negative for dysuria, hematuria, urinary incontinence, urinary frequency, nocturnal urination.  Endo: Negative for unusual weight change.    Physical Examination:   BP 118/74   Pulse 85   Ht 5\' 7"  (1.702 m)   Wt 277 lb 6.4 oz (125.8 kg)   BMI 43.45 kg/m   General: Well-nourished, well-developed in no acute distress.  Eyes: No icterus. Conjunctivae pink. Mouth: Oropharyngeal mucosa moist and pink , no lesions erythema or exudate. Lungs: Clear to auscultation bilaterally. Non-labored. Heart: Regular rate and rhythm, no murmurs rubs or gallops.  Abdomen: Bowel sounds are normal, nontender, nondistended, no hepatosplenomegaly or masses, no abdominal bruits or hernia , no rebound or guarding.   Neuro: Alert and oriented x 3.  Grossly intact. Skin: Warm and dry, no jaundice.   Psych: Alert and cooperative, normal mood and affect.   Imaging Studies: No results found.  Assessment and Plan:   Derrick Sandoval. is a 62 y.o. y/o male *here to follow up for black stools, history of constipation . No black stools, still has constipation, few agents are very expensive and few that I tried didn't work .    Plan   1. Stop linzess, try amitiza low dose and if it works he will call me next week for a script.  2. Check cbc to ensure stable.   Dr Jonathon Bellows  MD,MRCP [facility]) Follow up in 6 weeks

## 2018-06-09 LAB — CBC WITH DIFFERENTIAL/PLATELET
BASOS: 0 %
Basophils Absolute: 0 10*3/uL (ref 0.0–0.2)
EOS (ABSOLUTE): 0.2 10*3/uL (ref 0.0–0.4)
EOS: 2 %
HEMATOCRIT: 45.4 % (ref 37.5–51.0)
Hemoglobin: 15.7 g/dL (ref 13.0–17.7)
Immature Grans (Abs): 0 10*3/uL (ref 0.0–0.1)
Immature Granulocytes: 0 %
LYMPHS ABS: 2.3 10*3/uL (ref 0.7–3.1)
Lymphs: 28 %
MCH: 30.6 pg (ref 26.6–33.0)
MCHC: 34.6 g/dL (ref 31.5–35.7)
MCV: 89 fL (ref 79–97)
MONOS ABS: 0.9 10*3/uL (ref 0.1–0.9)
Monocytes: 11 %
NEUTROS ABS: 4.8 10*3/uL (ref 1.4–7.0)
Neutrophils: 59 %
Platelets: 239 10*3/uL (ref 150–450)
RBC: 5.13 x10E6/uL (ref 4.14–5.80)
RDW: 13.2 % (ref 12.3–15.4)
WBC: 8.2 10*3/uL (ref 3.4–10.8)

## 2018-06-12 ENCOUNTER — Encounter: Payer: Self-pay | Admitting: Family Medicine

## 2018-06-12 DIAGNOSIS — H2513 Age-related nuclear cataract, bilateral: Secondary | ICD-10-CM | POA: Diagnosis not present

## 2018-06-12 DIAGNOSIS — H40033 Anatomical narrow angle, bilateral: Secondary | ICD-10-CM | POA: Diagnosis not present

## 2018-06-15 ENCOUNTER — Other Ambulatory Visit: Payer: Self-pay

## 2018-06-15 ENCOUNTER — Other Ambulatory Visit: Payer: Self-pay | Admitting: Gastroenterology

## 2018-06-15 ENCOUNTER — Telehealth: Payer: Self-pay | Admitting: Gastroenterology

## 2018-06-15 MED ORDER — LUBIPROSTONE 8 MCG PO CAPS: 8.0000 ug | ORAL_CAPSULE | Freq: Two times a day (BID) | ORAL | 1 refills | Status: DC

## 2018-06-15 NOTE — Telephone Encounter (Signed)
Patient called & states the samples of Amitiza 8 mg has worked & would like a prescription called into [facility].

## 2018-06-15 NOTE — Telephone Encounter (Signed)
Spoke with pt and informed him that the Amitiza prescription has been sent to his preferred pharmacy.

## 2018-06-15 NOTE — Telephone Encounter (Signed)
Pt called earlier to rx called in he states the nurse called it in but only for 1 month he would like the refill called in for 3 month

## 2018-06-16 ENCOUNTER — Other Ambulatory Visit: Payer: Self-pay

## 2018-06-16 MED ORDER — LUBIPROSTONE 8 MCG PO CAPS: 8.0000 ug | ORAL_CAPSULE | Freq: Two times a day (BID) | ORAL | 1 refills | Status: AC

## 2018-06-16 NOTE — Telephone Encounter (Signed)
Rx for Amitiza has been sent for a 90 day supply per pt request.

## 2018-06-30 ENCOUNTER — Encounter: Payer: Self-pay | Admitting: Family Medicine

## 2018-07-03 ENCOUNTER — Ambulatory Visit (INDEPENDENT_AMBULATORY_CARE_PROVIDER_SITE_OTHER): Payer: PPO | Admitting: Family Medicine

## 2018-07-03 ENCOUNTER — Encounter: Payer: Self-pay | Admitting: Family Medicine

## 2018-07-03 VITALS — BP 161/95 | HR 65 | Temp 97.9°F | Ht 67.0 in | Wt 278.7 lb

## 2018-07-03 DIAGNOSIS — J069 Acute upper respiratory infection, unspecified: Secondary | ICD-10-CM | POA: Diagnosis not present

## 2018-07-03 MED ORDER — PREDNISONE 50 MG PO TABS: 50.0000 mg | ORAL_TABLET | Freq: Every day | ORAL | 0 refills | Status: DC

## 2018-07-03 NOTE — Progress Notes (Signed)
BP (!) 161/95   Pulse 65   Temp 97.9 F (36.6 C) (Oral)   Ht 5\' 7"  (1.702 m)   Wt 278 lb 11.2 oz (126.4 kg)   SpO2 97%   BMI 43.65 kg/m    Subjective:    Patient ID: Derrick Ball., male    DOB: 1956-07-11, 62 y.o.   MRN: [id number]  HPI: Derrick Deeg. is a 62 y.o. male  Chief Complaint  Patient presents with  . URI    pt states he has had congestion, sinus pressure, headache and cough for 4 days    UPPER RESPIRATORY TRACT INFECTION Duration: 4 days Worst symptom: congestion  Fever: no Cough: yes Shortness of breath: no Wheezing: no Chest pain: no Chest tightness: no Chest congestion: no Nasal congestion: no Runny nose: no Post nasal drip: yes Sneezing: yes Sore throat: yes Swollen glands: no Sinus pressure: yes Headache: yes Face pain: yes Toothache: no Ear pain: no  Ear pressure: yes bilateral Eyes red/itching:no Eye drainage/crusting: no  Vomiting: no Rash: no Fatigue: yes Sick contacts: no Strep contacts: no  Context: stable Recurrent sinusitis: yes Relief with OTC cold/cough medications: no  Treatments attempted: multiple allergy medicines, mucinex- kept him up   Relevant past medical, surgical, family and social history reviewed and updated as indicated. Interim medical history since our last visit reviewed. Allergies and medications reviewed and updated.  Review of Systems  Constitutional: Positive for chills and fatigue. Negative for activity change, appetite change, diaphoresis, fever and unexpected weight change.  HENT: Positive for congestion, postnasal drip, rhinorrhea, sinus pressure, sinus pain and sneezing. Negative for dental problem, drooling, ear discharge, ear pain, facial swelling, hearing loss, mouth sores, nosebleeds, sore throat, tinnitus, trouble swallowing and voice change.   Eyes: Negative.   Respiratory: Negative.   Cardiovascular: Negative.   Psychiatric/Behavioral: Negative.     Per HPI unless specifically  indicated above     Objective:    BP (!) 161/95   Pulse 65   Temp 97.9 F (36.6 C) (Oral)   Ht 5\' 7"  (1.702 m)   Wt 278 lb 11.2 oz (126.4 kg)   SpO2 97%   BMI 43.65 kg/m   Wt Readings from Last 3 Encounters:  07/03/18 278 lb 11.2 oz (126.4 kg)  06/08/18 277 lb 6.4 oz (125.8 kg)  06/01/18 276 lb (125.2 kg)    Physical Exam  Constitutional: He is oriented to person, place, and time. He appears well-developed and well-nourished. No distress.  HENT:  Head: Normocephalic and atraumatic.  Right Ear: Hearing, tympanic membrane, external ear and ear canal normal.  Left Ear: Hearing, tympanic membrane, external ear and ear canal normal.  Nose: Mucosal edema and rhinorrhea present.  Mouth/Throat: Uvula is midline, oropharynx is clear and moist and mucous membranes are normal. No oropharyngeal exudate.  Eyes: Pupils are equal, round, and reactive to light. Conjunctivae, EOM and lids are normal. Right eye exhibits no discharge. Left eye exhibits no discharge. No scleral icterus.  Neck: Normal range of motion. Neck supple. No JVD present. No tracheal deviation present. No thyromegaly present.  Cardiovascular: Normal rate, regular rhythm, normal heart sounds and intact distal pulses. Exam reveals no gallop and no friction rub.  No murmur heard. Pulmonary/Chest: Effort normal and breath sounds normal. No stridor. No respiratory distress. He has no wheezes. He has no rales. He exhibits no tenderness.  Musculoskeletal: Normal range of motion.  Lymphadenopathy:    He has no cervical adenopathy.  Neurological:  He is alert and oriented to person, place, and time.  Skin: Skin is warm, dry and intact. Capillary refill takes less than 2 seconds. No rash noted. He is not diaphoretic. No erythema. No pallor.  Psychiatric: He has a normal mood and affect. His speech is normal and behavior is normal. Judgment and thought content normal. Cognition and memory are normal.    Results for orders placed or  performed in visit on 06/08/18  CBC with Differential/Platelet  Result Value Ref Range   WBC 8.2 3.4 - 10.8 x10E3/uL   RBC 5.13 4.14 - 5.80 x10E6/uL   Hemoglobin 15.7 13.0 - 17.7 g/dL   Hematocrit 45.4 37.5 - 51.0 %   MCV 89 79 - 97 fL   MCH 30.6 26.6 - 33.0 pg   MCHC 34.6 31.5 - 35.7 g/dL   RDW 13.2 12.3 - 15.4 %   Platelets 239 150 - 450 x10E3/uL   Neutrophils 59 Not Estab. %   Lymphs 28 Not Estab. %   Monocytes 11 Not Estab. %   Eos 2 Not Estab. %   Basos 0 Not Estab. %   Neutrophils Absolute 4.8 1.4 - 7.0 x10E3/uL   Lymphocytes Absolute 2.3 0.7 - 3.1 x10E3/uL   Monocytes Absolute 0.9 0.1 - 0.9 x10E3/uL   EOS (ABSOLUTE) 0.2 0.0 - 0.4 x10E3/uL   Basophils Absolute 0.0 0.0 - 0.2 x10E3/uL   Immature Granulocytes 0 Not Estab. %   Immature Grans (Abs) 0.0 0.0 - 0.1 x10E3/uL      Assessment & Plan:   Problem List Items Addressed This Visit    None    Visit Diagnoses    Upper respiratory tract infection, unspecified type    -  Primary   No sign of sinusitis. Will treat with prednisone, if not getting better in a couple of days, will treat with abx. Call with any concerns.        Follow up plan: Return if symptoms worsen or fail to improve.

## 2018-07-05 ENCOUNTER — Encounter: Payer: Self-pay | Admitting: Family Medicine

## 2018-07-05 NOTE — Telephone Encounter (Signed)
Pt seen by [name], DO on 07/03/18, A&P stated, " Visit Diagnoses    Upper respiratory tract infection, unspecified type    -  Primary   No sign of sinusitis. Will treat with prednisone, if not getting better in a couple of days, will treat with abx. Call with any concerns   "

## 2018-07-06 MED ORDER — AZITHROMYCIN 250 MG PO TABS: ORAL_TABLET | ORAL | 0 refills | Status: DC

## 2018-07-09 ENCOUNTER — Telehealth: Payer: Self-pay | Admitting: Family Medicine

## 2018-07-10 NOTE — Telephone Encounter (Signed)
Requested Prescriptions  Pending Prescriptions Disp Refills  . omeprazole (PRILOSEC) 40 MG capsule [Pharmacy Med Name: OMEPRAZOLE DR 40MG   CAP] 90 capsule 0    Sig: TAKE 1 CAPSULE BY MOUTH ONCE DAILY     Gastroenterology: Proton Pump Inhibitors Passed - 07/09/2018 11:15 AM      Passed - Valid encounter within last 12 months    Recent Outpatient Visits          1 week ago Upper respiratory tract infection, unspecified type   [place], [place], DO   1 month ago Upper respiratory tract infection, unspecified type   [facility] [place], [place]   1 month ago [place], [place], [place]   2 months ago Benign hypertension   [place], Megan P, DO   4 months ago Acute sinusitis, recurrence not specified, unspecified location   [place], MD      Future Appointments            In 2 weeks Jonathon Bellows, MD [place]   In 3 months Wynetta Emery, Barb Merino, DO [employer], [place]

## 2018-07-17 DIAGNOSIS — R339 Retention of urine, unspecified: Secondary | ICD-10-CM | POA: Diagnosis not present

## 2018-07-21 ENCOUNTER — Encounter: Payer: Self-pay | Admitting: Family Medicine

## 2018-07-26 ENCOUNTER — Ambulatory Visit: Payer: PPO | Admitting: Gastroenterology

## 2018-08-07 ENCOUNTER — Encounter: Payer: Self-pay | Admitting: Family Medicine

## 2018-08-08 ENCOUNTER — Encounter: Payer: Self-pay | Admitting: Family Medicine

## 2018-08-08 MED ORDER — FAMOTIDINE 20 MG PO TABS: 20.0000 mg | ORAL_TABLET | Freq: Two times a day (BID) | ORAL | 1 refills | Status: DC

## 2018-08-10 ENCOUNTER — Encounter: Payer: Self-pay | Admitting: Family Medicine

## 2018-09-11 DIAGNOSIS — L820 Inflamed seborrheic keratosis: Secondary | ICD-10-CM | POA: Diagnosis not present

## 2018-09-15 ENCOUNTER — Telehealth: Payer: Self-pay | Admitting: Gastroenterology

## 2018-09-15 NOTE — Telephone Encounter (Signed)
I have called patient for a f/u appointment with Dr Webb Silversmith per a my chart message from the patient needing to make one. Jadjah forwarded it to me.

## 2018-10-02 ENCOUNTER — Ambulatory Visit (INDEPENDENT_AMBULATORY_CARE_PROVIDER_SITE_OTHER): Payer: PPO | Admitting: Gastroenterology

## 2018-10-02 ENCOUNTER — Encounter: Payer: Self-pay | Admitting: Gastroenterology

## 2018-10-02 VITALS — BP 129/72 | HR 73 | Ht 67.0 in | Wt 281.0 lb

## 2018-10-02 DIAGNOSIS — K5900 Constipation, unspecified: Secondary | ICD-10-CM | POA: Diagnosis not present

## 2018-10-02 NOTE — Progress Notes (Signed)
Derrick Bellows MD, MRCP(U.K) [address]  [place]  [place], [place] [address]  Main: [phone]  Fax: [phone]   Primary Care Physician: Valerie Roys, DO  Primary Gastroenterologist:  Dr. Jonathon Sandoval   Chief Complaint  Patient presents with  . Follow-up    Constipation    HPI: Derrick Sandoval. is a 63 y.o. male   Summary of history :  He is today being followed for constipation. He was initially referred and seen on 04/28/18 for black stools. Labs 04/21/2018 demonstrates a hemoglobin of 15.6 and an MCV of 88. CMP normal except a mildly elevated ALT at 47 and a glucose of 104.  CT scan of the abdomen in May 2018 demonstrated per rectum mildly distended with stool, no rectal wall thickening.  05/12/18 : EGD : gastritis , submucosal mass seen in the gastric antrum, gastritis seen on biopsy.  He stopped using NSAIDs and all his abdominal pain resolved. Colonoscopy : 2 polyps resected- 1/2 tubular adenoma   05/22/18 : EUS of gastric mass seen on EGD suggests the mass is a lipoma whichis benign.   For his constipation Linzess either caused diarrhea at a high dose or did not work at a very low dose.  MiraLAX also did not work and lactulose was too expensive.   Interval history 06/08/2018 -10/02/2018  Called and informed is on 06/15/2018 that 8 mg of amitiza  had worked and we called into his pharmacy.   CBC Latest Ref Rng & Units 06/08/2018 04/21/2018 01/18/2018  WBC 3.4 - 10.8 x10E3/uL 8.2 6.9 7.9  Hemoglobin 13.0 - 17.7 g/dL 15.7 15.6 15.6  Hematocrit 37.5 - 51.0 % 45.4 47.8 45.6  Platelets 150 - 450 x10E3/uL 239 209 218   No abdominal pain , constipation resolved. Normal color stools.   Current Outpatient Medications  Medication Sig Dispense Refill  . acyclovir (ZOVIRAX) 400 MG tablet Take 1 tablet (400 mg total) by mouth 2 (two) times daily. 180 tablet 1  . albuterol (PROVENTIL HFA;VENTOLIN HFA) 108 (90 Base) MCG/ACT inhaler Inhale 1-2 puffs into the  lungs every 6 (six) hours as needed for wheezing or shortness of breath.    . Ascorbic Acid (VITAMIN C) 1000 MG tablet Take 1,000 mg by mouth at bedtime.     Marland Kitchen aspirin EC 81 MG tablet Take 81 mg by mouth daily.    . cholecalciferol (VITAMIN D) 1000 UNITS tablet Take 1,000 Units by mouth daily.     . famotidine (PEPCID) 20 MG tablet Take 1 tablet (20 mg total) by mouth 2 (two) times daily. 180 tablet 1  . fluticasone (FLONASE) 50 MCG/ACT nasal spray Place 1 spray into both nostrils 2 (two) times daily. 48 g 12  . lisinopril (PRINIVIL,ZESTRIL) 20 MG tablet Take 1 tablet (20 mg total) by mouth daily. (Patient taking differently: Take 20 mg by mouth at bedtime. ) 90 tablet 1  . lubiprostone (AMITIZA) 8 MCG capsule Take 1 capsule (8 mcg total) by mouth 2 (two) times daily with a meal. 180 capsule 1  . montelukast (SINGULAIR) 10 MG tablet Take 1 tablet (10 mg total) by mouth at bedtime. 90 tablet 3  . Multiple Vitamins-Minerals (MULTIVITAMIN WITH MINERALS) tablet Take 1 tablet by mouth daily.    Marland Kitchen nystatin cream (MYCOSTATIN) APPLY CREAM TOPICALLY TWICE DAILY (Patient taking differently: Apply 1 application topically daily as needed (for skin irritation/rash.). ) 30 g 6  . omeprazole (PRILOSEC) 40 MG capsule TAKE 1 CAPSULE BY MOUTH ONCE  DAILY 90 capsule 0  . tamsulosin (FLOMAX) 0.4 MG CAPS capsule Take 1 capsule (0.4 mg total) by mouth 2 (two) times daily. 180 capsule 1  . triamcinolone cream (KENALOG) 0.1 % Apply 1 application topically 2 (two) times daily as needed (for skin irritation/rash.).     Marland Kitchen azithromycin (ZITHROMAX) 250 MG tablet Take 2 tabs day one, then 1 tab daily until complete (Patient not taking: Reported on 10/02/2018) 6 tablet 0  . finasteride (PROSCAR) 5 MG tablet Take 1 tablet (5 mg total) by mouth daily. (Patient not taking: Reported on 10/02/2018) 90 tablet 1  . predniSONE (DELTASONE) 50 MG tablet Take 1 tablet (50 mg total) by mouth daily with breakfast. (Patient not taking: Reported on  10/02/2018) 5 tablet 0  . ranitidine (ZANTAC) 300 MG tablet Take 1 tablet (300 mg total) by mouth at bedtime. (Patient not taking: Reported on 10/02/2018) 90 tablet 1   No current facility-administered medications for this visit.     Allergies as of 10/02/2018  . (No Known Allergies)    ROS:  General: Negative for anorexia, weight loss, fever, chills, fatigue, weakness. ENT: Negative for hoarseness, difficulty swallowing , nasal congestion. CV: Negative for chest pain, angina, palpitations, dyspnea on exertion, peripheral edema.  Respiratory: Negative for dyspnea at rest, dyspnea on exertion, cough, sputum, wheezing.  GI: See history of present illness. GU:  Negative for dysuria, hematuria, urinary incontinence, urinary frequency, nocturnal urination.  Endo: Negative for unusual weight change.    Physical Examination:   BP 129/72   Pulse 73   Ht 5\' 7"  (1.702 m)   Wt 281 lb (127.5 kg)   BMI 44.01 kg/m   General: Well-nourished, well-developed in no acute distress.  Eyes: No icterus. Conjunctivae pink. Mouth: Oropharyngeal mucosa moist and pink , no lesions erythema or exudate. Lungs: Clear to auscultation bilaterally. Non-labored. Heart: Regular rate and rhythm, no murmurs rubs or gallops.  Abdomen: Bowel sounds are normal, nontender, nondistended, no hepatosplenomegaly or masses, no abdominal bruits or hernia , no rebound or guarding.   Extremities: No lower extremity edema. No clubbing or deformities. Neuro: Alert and oriented x 3.  Grossly intact. Skin: Warm and dry, no jaundice.   Psych: Alert and cooperative, normal mood and affect.   Imaging Studies: No results found.  Assessment and Plan:   Derrick Sandoval. is a 63 y.o. y/o male*here to follow up for constipation. Doing well on Amitiza 8 mcg   Having ddificulties with the cost- will provide few more samples. Will give coupons.    Dr Derrick Bellows  MD,MRCP [facility]) Follow up PRN

## 2018-10-06 ENCOUNTER — Other Ambulatory Visit: Payer: Self-pay | Admitting: Family Medicine

## 2018-10-06 ENCOUNTER — Encounter: Payer: Self-pay | Admitting: Family Medicine

## 2018-10-11 ENCOUNTER — Encounter: Payer: Self-pay | Admitting: Family Medicine

## 2018-10-13 DIAGNOSIS — I10 Essential (primary) hypertension: Secondary | ICD-10-CM | POA: Diagnosis not present

## 2018-10-13 DIAGNOSIS — R339 Retention of urine, unspecified: Secondary | ICD-10-CM | POA: Diagnosis not present

## 2018-10-13 DIAGNOSIS — Z79899 Other long term (current) drug therapy: Secondary | ICD-10-CM | POA: Diagnosis not present

## 2018-10-13 DIAGNOSIS — N401 Enlarged prostate with lower urinary tract symptoms: Secondary | ICD-10-CM | POA: Diagnosis not present

## 2018-10-13 DIAGNOSIS — M1990 Unspecified osteoarthritis, unspecified site: Secondary | ICD-10-CM | POA: Diagnosis not present

## 2018-10-13 DIAGNOSIS — N35919 Unspecified urethral stricture, male, unspecified site: Secondary | ICD-10-CM | POA: Diagnosis not present

## 2018-10-13 DIAGNOSIS — E669 Obesity, unspecified: Secondary | ICD-10-CM | POA: Diagnosis not present

## 2018-10-13 DIAGNOSIS — N138 Other obstructive and reflux uropathy: Secondary | ICD-10-CM | POA: Diagnosis not present

## 2018-10-13 DIAGNOSIS — N319 Neuromuscular dysfunction of bladder, unspecified: Secondary | ICD-10-CM | POA: Diagnosis not present

## 2018-10-13 DIAGNOSIS — Z7982 Long term (current) use of aspirin: Secondary | ICD-10-CM | POA: Diagnosis not present

## 2018-10-13 DIAGNOSIS — R338 Other retention of urine: Secondary | ICD-10-CM | POA: Diagnosis not present

## 2018-10-13 DIAGNOSIS — Z6841 Body Mass Index (BMI) 40.0 and over, adult: Secondary | ICD-10-CM | POA: Diagnosis not present

## 2018-10-22 ENCOUNTER — Other Ambulatory Visit: Payer: Self-pay | Admitting: Family Medicine

## 2018-10-23 ENCOUNTER — Ambulatory Visit: Payer: PPO | Admitting: Family Medicine

## 2018-10-23 NOTE — Telephone Encounter (Signed)
Requested Prescriptions  Pending Prescriptions Disp Refills  . omeprazole (PRILOSEC) 40 MG capsule [Pharmacy Med Name: Omeprazole 40 MG Oral Capsule Delayed Release] 90 capsule 0    Sig: Take 1 capsule by mouth once daily     Gastroenterology: Proton Pump Inhibitors Passed - 10/22/2018  6:31 AM      Passed - Valid encounter within last 12 months    Recent Outpatient Visits          3 months ago Upper respiratory tract infection, unspecified type   [place], [place], DO   5 months ago Upper respiratory tract infection, unspecified type   [facility] [place], [place]   5 months ago [place], [place], [place]   6 months ago Benign hypertension   [place], [place], DO   8 months ago Acute sinusitis, recurrence not specified, unspecified location   [place], [place]      Future Appointments            In 2 days Wynetta Emery, Barb Merino, DO [facility], [facility]

## 2018-10-25 ENCOUNTER — Encounter: Payer: Self-pay | Admitting: Family Medicine

## 2018-10-25 ENCOUNTER — Ambulatory Visit (INDEPENDENT_AMBULATORY_CARE_PROVIDER_SITE_OTHER): Payer: PPO | Admitting: Family Medicine

## 2018-10-25 ENCOUNTER — Other Ambulatory Visit: Payer: Self-pay

## 2018-10-25 ENCOUNTER — Encounter: Payer: PPO | Admitting: Family Medicine

## 2018-10-25 VITALS — BP 164/109 | HR 64 | Temp 97.9°F | Ht 67.3 in | Wt 278.4 lb

## 2018-10-25 DIAGNOSIS — E782 Mixed hyperlipidemia: Secondary | ICD-10-CM | POA: Diagnosis not present

## 2018-10-25 DIAGNOSIS — R338 Other retention of urine: Secondary | ICD-10-CM

## 2018-10-25 DIAGNOSIS — E559 Vitamin D deficiency, unspecified: Secondary | ICD-10-CM | POA: Diagnosis not present

## 2018-10-25 DIAGNOSIS — N401 Enlarged prostate with lower urinary tract symptoms: Secondary | ICD-10-CM | POA: Diagnosis not present

## 2018-10-25 DIAGNOSIS — I10 Essential (primary) hypertension: Secondary | ICD-10-CM

## 2018-10-25 DIAGNOSIS — K760 Fatty (change of) liver, not elsewhere classified: Secondary | ICD-10-CM

## 2018-10-25 DIAGNOSIS — J301 Allergic rhinitis due to pollen: Secondary | ICD-10-CM | POA: Diagnosis not present

## 2018-10-25 MED ORDER — FAMOTIDINE 20 MG PO TABS: 20.0000 mg | ORAL_TABLET | Freq: Two times a day (BID) | ORAL | 1 refills | Status: DC

## 2018-10-25 MED ORDER — OMEPRAZOLE 40 MG PO CPDR: 40.0000 mg | DELAYED_RELEASE_CAPSULE | Freq: Every day | ORAL | 1 refills | Status: DC

## 2018-10-25 MED ORDER — TRIAMCINOLONE ACETONIDE 40 MG/ML IJ SUSP
40.0000 mg | Freq: Once | INTRAMUSCULAR | Status: AC
  Administered 2018-10-25: 40 mg via INTRAMUSCULAR

## 2018-10-25 MED ORDER — LISINOPRIL 20 MG PO TABS: 20.0000 mg | ORAL_TABLET | Freq: Every day | ORAL | 1 refills | Status: DC

## 2018-10-25 MED ORDER — TAMSULOSIN HCL 0.4 MG PO CAPS: 0.4000 mg | ORAL_CAPSULE | Freq: Two times a day (BID) | ORAL | 1 refills | Status: DC

## 2018-10-25 MED ORDER — MONTELUKAST SODIUM 10 MG PO TABS: 10.0000 mg | ORAL_TABLET | Freq: Every day | ORAL | 3 refills | Status: DC

## 2018-10-25 MED ORDER — ACYCLOVIR 400 MG PO TABS: 400.0000 mg | ORAL_TABLET | Freq: Two times a day (BID) | ORAL | 1 refills | Status: DC

## 2018-10-25 MED ORDER — FINASTERIDE 5 MG PO TABS: 5.0000 mg | ORAL_TABLET | Freq: Every day | ORAL | 1 refills | Status: DC

## 2018-10-25 NOTE — Assessment & Plan Note (Signed)
Encouraged slow, steady weight loss of 1-2lbs per week. Increase exercise as needed. Call with any concerns.

## 2018-10-25 NOTE — Assessment & Plan Note (Signed)
Checking labs today. Await results. Call with any concerns.  

## 2018-10-25 NOTE — Assessment & Plan Note (Signed)
Elevated due to stress at home. Will watch DASH diet and continue to monitor. Recheck 3 months.

## 2018-10-25 NOTE — Progress Notes (Signed)
BP (!) 164/109 (BP Location: Left Arm, Patient Position: Sitting, Cuff Size: Large)   Pulse 64   Temp 97.9 F (36.6 C)   Ht 5' 7.3" (1.709 m)   Wt 278 lb 6 oz (126.3 kg)   SpO2 95%   BMI 43.21 kg/m    Subjective:    Patient ID: Derrick Ball., male    DOB: June 28, 1956, 63 y.o.   MRN: [id number]  HPI: Derrick Burch. is a 63 y.o. male  Chief Complaint  Patient presents with  . Hyperlipidemia  . Hypertension   HYPERTENSION / [place] Satisfied with current treatment? yes Duration of hypertension: chronic BP monitoring frequency: not checking BP medication side effects: no Past BP meds: lisinopril Duration of hyperlipidemia: chronic Cholesterol medication side effects: not on any Medication compliance: excellent compliance Aspirin: yes Recent stressors: no Recurrent headaches: no Visual changes: no Palpitations: no Dyspnea: no Chest pain: no Lower extremity edema: no Dizzy/lightheaded: no  UPPER RESPIRATORY TRACT INFECTION Duration: 1 month Worst symptom: congestion Fever: no Cough: yes Shortness of breath: no Wheezing: no Chest pain: no Chest tightness: no Chest congestion: no Nasal congestion: yes Runny nose: yes Post nasal drip: yes Sneezing: yes Sore throat: no Swollen glands: no Sinus pressure: yes Headache: yes Face pain: no Toothache: yes Ear pain: yes left Ear pressure: yes left Eyes red/itching:no Eye drainage/crusting: no  Vomiting: no Rash: no Fatigue: yes Sick contacts: yes Strep contacts: no  Context: stable Recurrent sinusitis: yes Relief with OTC cold/cough medications: no  Treatments attempted: flonase and nasal saline    Relevant past medical, surgical, family and social history reviewed and updated as indicated. Interim medical history since our last visit reviewed. Allergies and medications reviewed and updated.  Review of Systems  Constitutional: Negative.   HENT: Positive for congestion, postnasal drip,  rhinorrhea, sinus pressure, sinus pain and sneezing. Negative for dental problem, drooling, ear discharge, ear pain, facial swelling, hearing loss, mouth sores, nosebleeds, sore throat, tinnitus, trouble swallowing and voice change.   Respiratory: Negative.   Cardiovascular: Negative.   Gastrointestinal: Negative.   Psychiatric/Behavioral: Negative.     Per HPI unless specifically indicated above     Objective:    BP (!) 164/109 (BP Location: Left Arm, Patient Position: Sitting, Cuff Size: Large)   Pulse 64   Temp 97.9 F (36.6 C)   Ht 5' 7.3" (1.709 m)   Wt 278 lb 6 oz (126.3 kg)   SpO2 95%   BMI 43.21 kg/m   Wt Readings from Last 3 Encounters:  10/25/18 278 lb 6 oz (126.3 kg)  10/02/18 281 lb (127.5 kg)  07/03/18 278 lb 11.2 oz (126.4 kg)    Physical Exam Vitals signs and nursing note reviewed.  Constitutional:      General: He is not in acute distress.    Appearance: Normal appearance. He is not ill-appearing, toxic-appearing or diaphoretic.  HENT:     Head: Normocephalic and atraumatic.     Right Ear: Tympanic membrane, ear canal and external ear normal. There is no impacted cerumen.     Left Ear: Tympanic membrane, ear canal and external ear normal. There is no impacted cerumen.     Nose: Rhinorrhea present. No congestion.     Mouth/Throat:     Mouth: Mucous membranes are moist.     Pharynx: Oropharynx is clear. No oropharyngeal exudate or posterior oropharyngeal erythema.  Eyes:     General: No scleral icterus.       Right  eye: No discharge.        Left eye: No discharge.     Extraocular Movements: Extraocular movements intact.     Conjunctiva/sclera: Conjunctivae normal.     Pupils: Pupils are equal, round, and reactive to light.  Neck:     Musculoskeletal: Normal range of motion and neck supple. No neck rigidity or muscular tenderness.     Vascular: No carotid bruit.  Cardiovascular:     Rate and Rhythm: Normal rate and regular rhythm.     Pulses: Normal  pulses.     Heart sounds: Normal heart sounds. No murmur. No friction rub. No gallop.   Pulmonary:     Effort: Pulmonary effort is normal. No respiratory distress.     Breath sounds: Normal breath sounds. No stridor. No wheezing, rhonchi or rales.  Chest:     Chest wall: No tenderness.  Musculoskeletal: Normal range of motion.  Lymphadenopathy:     Cervical: No cervical adenopathy.  Skin:    General: Skin is warm and dry.     Capillary Refill: Capillary refill takes less than 2 seconds.     Coloration: Skin is not jaundiced or pale.     Findings: No bruising, erythema, lesion or rash.  Neurological:     General: No focal deficit present.     Mental Status: He is alert and oriented to person, place, and time. Mental status is at baseline.     Cranial Nerves: No cranial nerve deficit.     Sensory: No sensory deficit.     Motor: No weakness.     Coordination: Coordination normal.     Gait: Gait normal.     Deep Tendon Reflexes: Reflexes normal.  Psychiatric:        Mood and Affect: Mood normal.        Behavior: Behavior normal.        Thought Content: Thought content normal.        Judgment: Judgment normal.     Results for orders placed or performed in visit on 06/08/18  CBC with Differential/Platelet  Result Value Ref Range   WBC 8.2 3.4 - 10.8 x10E3/uL   RBC 5.13 4.14 - 5.80 x10E6/uL   Hemoglobin 15.7 13.0 - 17.7 g/dL   Hematocrit 45.4 37.5 - 51.0 %   MCV 89 79 - 97 fL   MCH 30.6 26.6 - 33.0 pg   MCHC 34.6 31.5 - 35.7 g/dL   RDW 13.2 12.3 - 15.4 %   Platelets 239 150 - 450 x10E3/uL   Neutrophils 59 Not Estab. %   Lymphs 28 Not Estab. %   Monocytes 11 Not Estab. %   Eos 2 Not Estab. %   Basos 0 Not Estab. %   Neutrophils Absolute 4.8 1.4 - 7.0 x10E3/uL   Lymphocytes Absolute 2.3 0.7 - 3.1 x10E3/uL   Monocytes Absolute 0.9 0.1 - 0.9 x10E3/uL   EOS (ABSOLUTE) 0.2 0.0 - 0.4 x10E3/uL   Basophils Absolute 0.0 0.0 - 0.2 x10E3/uL   Immature Granulocytes 0 Not Estab. %    Immature Grans (Abs) 0.0 0.0 - 0.1 x10E3/uL      Assessment & Plan:   Problem List Items Addressed This Visit      Cardiovascular and Mediastinum   Benign hypertension - Primary    Elevated due to stress at home. Will watch DASH diet and continue to monitor. Recheck 3 months.       Relevant Medications   lisinopril (PRINIVIL,ZESTRIL) 20 MG tablet   Other  Relevant Orders   Comprehensive metabolic panel   CBC with Differential/Platelet     Respiratory   Rhinitis, allergic    Kenalog shot today. Continue flonase and singulair and PRN OTC meds. Call with any concerns.       Relevant Medications   triamcinolone acetonide (KENALOG-40) injection 40 mg (Start on 10/25/2018 10:15 AM)     Digestive   Fatty liver    Checking labs today. Await results. Call with any concerns.       Relevant Orders   Comprehensive metabolic panel   CBC with Differential/Platelet     Genitourinary   Benign prostatic hyperplasia with lower urinary tract symptoms    Checking labs today. Await results. Call with any concerns.       Relevant Medications   finasteride (PROSCAR) 5 MG tablet   tamsulosin (FLOMAX) 0.4 MG CAPS capsule   Other Relevant Orders   Comprehensive metabolic panel   CBC with Differential/Platelet     Other   Morbid obesity ([place])    Encouraged slow, steady weight loss of 1-2lbs per week. Increase exercise as needed. Call with any concerns.       Relevant Orders   Comprehensive metabolic panel   CBC with Differential/Platelet   Hyperlipidemia    Checking labs today. Await results. Call with any concerns.       Relevant Medications   lisinopril (PRINIVIL,ZESTRIL) 20 MG tablet   Other Relevant Orders   Comprehensive metabolic panel   Lipid Panel w/o Chol/HDL Ratio   CBC with Differential/Platelet   Vitamin D deficiency    Checking labs today. Await results. Call with any concerns.       Relevant Orders   Comprehensive metabolic panel   CBC with  Differential/Platelet       Follow up plan: Return in about 3 months (around 01/25/2019) for follow up BP.

## 2018-10-25 NOTE — Patient Instructions (Signed)
DASH Eating Plan  DASH stands for "Dietary Approaches to Stop Hypertension." The DASH eating plan is a healthy eating plan that has been shown to reduce high blood pressure (hypertension). It may also reduce your risk for type 2 diabetes, heart disease, and stroke. The DASH eating plan may also help with weight loss.  What are tips for following this plan?    General guidelines   Avoid eating more than 2,300 mg (milligrams) of salt (sodium) a day. If you have hypertension, you may need to reduce your sodium intake to 1,500 mg a day.   Limit alcohol intake to no more than 1 drink a day for nonpregnant women and 2 drinks a day for men. One drink equals 12 oz of beer, 5 oz of wine, or 1 oz of hard liquor.   Work with your health care provider to maintain a healthy body weight or to lose weight. Ask what an ideal weight is for you.   Get at least 30 minutes of exercise that causes your heart to beat faster (aerobic exercise) most days of the week. Activities may include walking, swimming, or biking.   Work with your health care provider or diet and nutrition specialist (dietitian) to adjust your eating plan to your individual calorie needs.  Reading food labels     Check food labels for the amount of sodium per serving. Choose foods with less than 5 percent of the Daily Value of sodium. Generally, foods with less than 300 mg of sodium per serving fit into this eating plan.   To find whole grains, look for the word "whole" as the first word in the ingredient list.  Shopping   Buy products labeled as "low-sodium" or "no salt added."   Buy fresh foods. Avoid canned foods and premade or frozen meals.  Cooking   Avoid adding salt when cooking. Use salt-free seasonings or herbs instead of table salt or sea salt. Check with your health care provider or pharmacist before using salt substitutes.   Do not fry foods. Rihn foods using healthy methods such as baking, boiling, grilling, and broiling instead.   Sires with  heart-healthy oils, such as olive, canola, soybean, or sunflower oil.  Meal planning   Eat a balanced diet that includes:  ? 5 or more servings of fruits and vegetables each day. At each meal, try to fill half of your plate with fruits and vegetables.  ? Up to 6-8 servings of whole grains each day.  ? Less than 6 oz of lean meat, poultry, or fish each day. A 3-oz serving of meat is about the same size as a deck of cards. One egg equals 1 oz.  ? 2 servings of low-fat dairy each day.  ? A serving of nuts, seeds, or beans 5 times each week.  ? Heart-healthy fats. Healthy fats called Omega-3 fatty acids are found in foods such as flaxseeds and coldwater fish, like sardines, salmon, and mackerel.   Limit how much you eat of the following:  ? Canned or prepackaged foods.  ? Food that is high in trans fat, such as fried foods.  ? Food that is high in saturated fat, such as fatty meat.  ? Sweets, desserts, sugary drinks, and other foods with added sugar.  ? Full-fat dairy products.   Do not salt foods before eating.   Try to eat at least 2 vegetarian meals each week.   Eat more home-cooked food and less restaurant, buffet, and fast food.     When eating at a restaurant, ask that your food be prepared with less salt or no salt, if possible.  What foods are recommended?  The items listed may not be a complete list. Talk with your dietitian about what dietary choices are best for you.  Grains  Whole-grain or whole-wheat bread. Whole-grain or whole-wheat pasta. Brown rice. Oatmeal. Quinoa. Bulgur. Whole-grain and low-sodium cereals. Pita bread. Low-fat, low-sodium crackers. Whole-wheat flour tortillas.  Vegetables  Fresh or frozen vegetables (raw, steamed, roasted, or grilled). Low-sodium or reduced-sodium tomato and vegetable juice. Low-sodium or reduced-sodium tomato sauce and tomato paste. Low-sodium or reduced-sodium canned vegetables.  Fruits  All fresh, dried, or frozen fruit. Canned fruit in natural juice (without  added sugar).  Meat and other protein foods  Skinless chicken or turkey. Ground chicken or turkey. Pork with fat trimmed off. Fish and seafood. Egg whites. Dried beans, peas, or lentils. Unsalted nuts, nut butters, and seeds. Unsalted canned beans. Lean cuts of beef with fat trimmed off. Low-sodium, lean deli meat.  Dairy  Low-fat (1%) or fat-free (skim) milk. Fat-free, low-fat, or reduced-fat cheeses. Nonfat, low-sodium ricotta or cottage cheese. Low-fat or nonfat yogurt. Low-fat, low-sodium cheese.  Fats and oils  Soft margarine without trans fats. Vegetable oil. Low-fat, reduced-fat, or light mayonnaise and salad dressings (reduced-sodium). Canola, safflower, olive, soybean, and sunflower oils. Avocado.  Seasoning and other foods  Herbs. Spices. Seasoning mixes without salt. Unsalted popcorn and pretzels. Fat-free sweets.  What foods are not recommended?  The items listed may not be a complete list. Talk with your dietitian about what dietary choices are best for you.  Grains  Baked goods made with fat, such as croissants, muffins, or some breads. Dry pasta or rice meal packs.  Vegetables  Creamed or fried vegetables. Vegetables in a cheese sauce. Regular canned vegetables (not low-sodium or reduced-sodium). Regular canned tomato sauce and paste (not low-sodium or reduced-sodium). Regular tomato and vegetable juice (not low-sodium or reduced-sodium). Pickles. Olives.  Fruits  Canned fruit in a light or heavy syrup. Fried fruit. Fruit in cream or butter sauce.  Meat and other protein foods  Fatty cuts of meat. Ribs. Fried meat. Bacon. Sausage. Bologna and other processed lunch meats. Salami. Fatback. Hotdogs. Bratwurst. Salted nuts and seeds. Canned beans with added salt. Canned or smoked fish. Whole eggs or egg yolks. Chicken or turkey with skin.  Dairy  Whole or 2% milk, cream, and half-and-half. Whole or full-fat cream cheese. Whole-fat or sweetened yogurt. Full-fat cheese. Nondairy creamers. Whipped toppings.  Processed cheese and cheese spreads.  Fats and oils  Butter. Stick margarine. Lard. Shortening. Ghee. Bacon fat. Tropical oils, such as coconut, palm kernel, or palm oil.  Seasoning and other foods  Salted popcorn and pretzels. Onion salt, garlic salt, seasoned salt, table salt, and sea salt. Worcestershire sauce. Tartar sauce. Barbecue sauce. Teriyaki sauce. Soy sauce, including reduced-sodium. Steak sauce. Canned and packaged gravies. Fish sauce. Oyster sauce. Cocktail sauce. Horseradish that you find on the shelf. Ketchup. Mustard. Meat flavorings and tenderizers. Bouillon cubes. Hot sauce and Tabasco sauce. Premade or packaged marinades. Premade or packaged taco seasonings. Relishes. Regular salad dressings.  Where to find more information:   National Heart, Lung, and Blood Institute: www.nhlbi.nih.gov   American Heart Association: www.heart.org  Summary   The DASH eating plan is a healthy eating plan that has been shown to reduce high blood pressure (hypertension). It may also reduce your risk for type 2 diabetes, heart disease, and stroke.   With the   DASH eating plan, you should limit salt (sodium) intake to 2,300 mg a day. If you have hypertension, you may need to reduce your sodium intake to 1,500 mg a day.   When on the DASH eating plan, aim to eat more fresh fruits and vegetables, whole grains, lean proteins, low-fat dairy, and heart-healthy fats.   Work with your health care provider or diet and nutrition specialist (dietitian) to adjust your eating plan to your individual calorie needs.  This information is not intended to replace advice given to you by your health care provider. Make sure you discuss any questions you have with your health care provider.  Document Released: 07/22/2011 Document Revised: 07/26/2016 Document Reviewed: 07/26/2016  Elsevier Interactive Patient Education  2019 [employer]

## 2018-10-25 NOTE — Assessment & Plan Note (Signed)
Kenalog shot today. Continue flonase and singulair and PRN OTC meds. Call with any concerns.

## 2018-10-26 LAB — CBC WITH DIFFERENTIAL/PLATELET
BASOS: 1 %
Basophils Absolute: 0 10*3/uL (ref 0.0–0.2)
EOS (ABSOLUTE): 0.2 10*3/uL (ref 0.0–0.4)
EOS: 4 %
HEMATOCRIT: 47.8 % (ref 37.5–51.0)
HEMOGLOBIN: 15.8 g/dL (ref 13.0–17.7)
Immature Grans (Abs): 0 10*3/uL (ref 0.0–0.1)
Immature Granulocytes: 0 %
LYMPHS: 37 %
Lymphocytes Absolute: 2.1 10*3/uL (ref 0.7–3.1)
MCH: 30.2 pg (ref 26.6–33.0)
MCHC: 33.1 g/dL (ref 31.5–35.7)
MCV: 91 fL (ref 79–97)
MONOCYTES: 12 %
Monocytes Absolute: 0.7 10*3/uL (ref 0.1–0.9)
NEUTROS ABS: 2.7 10*3/uL (ref 1.4–7.0)
Neutrophils: 46 %
Platelets: 196 10*3/uL (ref 150–450)
RBC: 5.23 x10E6/uL (ref 4.14–5.80)
RDW: 13.6 % (ref 11.6–15.4)
WBC: 5.7 10*3/uL (ref 3.4–10.8)

## 2018-10-26 LAB — COMPREHENSIVE METABOLIC PANEL
ALBUMIN: 4.7 g/dL (ref 3.8–4.8)
ALK PHOS: 61 IU/L (ref 39–117)
ALT: 64 IU/L — ABNORMAL HIGH (ref 0–44)
AST: 49 IU/L — ABNORMAL HIGH (ref 0–40)
Albumin/Globulin Ratio: 2 (ref 1.2–2.2)
BILIRUBIN TOTAL: 0.4 mg/dL (ref 0.0–1.2)
BUN / CREAT RATIO: 18 (ref 10–24)
BUN: 17 mg/dL (ref 8–27)
CALCIUM: 9.6 mg/dL (ref 8.6–10.2)
CHLORIDE: 106 mmol/L (ref 96–106)
CO2: 21 mmol/L (ref 20–29)
Creatinine, Ser: 0.92 mg/dL (ref 0.76–1.27)
GFR calc Af Amer: 103 mL/min/{1.73_m2} (ref >59)
GFR calc non Af Amer: 89 mL/min/{1.73_m2} (ref >59)
GLUCOSE: 101 mg/dL — AB (ref 65–99)
Globulin, Total: 2.3 g/dL (ref 1.5–4.5)
POTASSIUM: 4.4 mmol/L (ref 3.5–5.2)
Sodium: 143 mmol/L (ref 134–144)
TOTAL PROTEIN: 7 g/dL (ref 6.0–8.5)

## 2018-10-26 LAB — LIPID PANEL W/O CHOL/HDL RATIO
CHOLESTEROL TOTAL: 187 mg/dL (ref 100–199)
HDL: 41 mg/dL (ref >39)
LDL Calculated: 124 mg/dL — ABNORMAL HIGH (ref 0–99)
Triglycerides: 108 mg/dL (ref 0–149)
VLDL CHOLESTEROL CAL: 22 mg/dL (ref 5–40)

## 2018-11-07 ENCOUNTER — Encounter: Payer: Self-pay | Admitting: Family Medicine

## 2018-11-07 NOTE — Telephone Encounter (Signed)
Please schedule virtual visit

## 2018-11-08 ENCOUNTER — Other Ambulatory Visit: Payer: Self-pay

## 2018-11-08 ENCOUNTER — Encounter: Payer: Self-pay | Admitting: Family Medicine

## 2018-11-09 ENCOUNTER — Encounter: Payer: Self-pay | Admitting: Family Medicine

## 2018-11-09 ENCOUNTER — Ambulatory Visit (INDEPENDENT_AMBULATORY_CARE_PROVIDER_SITE_OTHER): Payer: PPO | Admitting: Family Medicine

## 2018-11-09 VITALS — Ht 67.0 in | Wt 275.0 lb

## 2018-11-09 DIAGNOSIS — F419 Anxiety disorder, unspecified: Secondary | ICD-10-CM | POA: Diagnosis not present

## 2018-11-09 MED ORDER — HYDROXYZINE PAMOATE 100 MG PO CAPS: 100.0000 mg | ORAL_CAPSULE | Freq: Three times a day (TID) | ORAL | 3 refills | Status: DC | PRN

## 2018-11-09 NOTE — Progress Notes (Signed)
Ht 5\' 7"  (1.702 m) Comment: Patient reported  Wt 275 lb (124.7 kg) Comment: Patient reported  BMI 43.07 kg/m    Subjective:    Patient ID: Derrick Sandoval., male    DOB: 1956-01-01, 63 y.o.   MRN: [id number]  HPI: Derrick Sandoval. is a 63 y.o. male  Chief Complaint  Patient presents with  . Anxiety   ANXIETY/STRESS- wife is leaving him. Between that and the corona virus, he is under a lot of stress and not feeling like himself. States that he has a lot of nervous energy which is making him feel like he wants to start running.  Duration:exacerbated Anxious mood: yes  Excessive worrying: yes Irritability: no  Sweating: no Nausea: no Palpitations:no Hyperventilation: no Panic attacks: no Agoraphobia: no  Obscessions/compulsions: no Depressed mood: yes Depression screen [facility] 2/9 10/25/2018 04/21/2018 06/06/2017 02/22/2017 01/24/2017  Decreased Interest 0 0 0 0 0  Down, Depressed, Hopeless 0 0 0 0 0  PHQ - 2 Score 0 0 0 0 0  Altered sleeping 3 - - 3 3  Tired, decreased energy 2 - - 3 1  Change in appetite 2 - - 0 3  Feeling bad or failure about yourself  0 - - 0 1  Trouble concentrating 2 - - 0 2  Moving slowly or fidgety/restless 0 - - 0 0  Suicidal thoughts 0 - - 0 0  PHQ-9 Score 9 - - 6 10  Difficult doing work/chores Somewhat difficult - - - -   Anhedonia: no Weight changes: no Insomnia: yes hard to fall asleep  Hypersomnia: no Fatigue/loss of energy: yes Feelings of worthlessness: no Feelings of guilt: no Impaired concentration/indecisiveness: no Suicidal ideations: no  Crying spells: no Recent Stressors/Life Changes: yes   Relationship problems: yes   Family stress: yes     Financial stress: no    Job stress: no    Recent death/loss: no   Relevant past medical, surgical, family and social history reviewed and updated as indicated. Interim medical history since our last visit reviewed. Allergies and medications reviewed and updated.  Review of Systems   Constitutional: Negative.   Respiratory: Negative.   Cardiovascular: Negative.   Musculoskeletal: Negative.   Skin: Negative.   Psychiatric/Behavioral: Positive for agitation and sleep disturbance. Negative for behavioral problems, confusion, decreased concentration, dysphoric mood, hallucinations, self-injury and suicidal ideas. The patient is nervous/anxious. The patient is not hyperactive.     Per HPI unless specifically indicated above     Objective:    Ht 5\' 7"  (1.702 m) Comment: Patient reported  Wt 275 lb (124.7 kg) Comment: Patient reported  BMI 43.07 kg/m   Wt Readings from Last 3 Encounters:  11/09/18 275 lb (124.7 kg)  10/25/18 278 lb 6 oz (126.3 kg)  10/02/18 281 lb (127.5 kg)    Physical Exam Constitutional:      General: He is not in acute distress.    Appearance: Normal appearance. He is well-developed. He is obese. He is not ill-appearing, toxic-appearing or diaphoretic.  HENT:     Head: Normocephalic and atraumatic.     Right Ear: Hearing and external ear normal.     Left Ear: Hearing and external ear normal.     Nose: Nose normal.  Eyes:     General: Lids are normal. No scleral icterus.       Right eye: No discharge.        Left eye: No discharge.     Extraocular  Movements: Extraocular movements intact.     Conjunctiva/sclera: Conjunctivae normal.     Pupils: Pupils are equal, round, and reactive to light.  Neck:     Musculoskeletal: Normal range of motion.  Pulmonary:     Effort: Pulmonary effort is normal. No respiratory distress.     Comments: Speaking in full sentances Musculoskeletal: Normal range of motion.  Skin:    Coloration: Skin is not jaundiced or pale.     Findings: No bruising, erythema, lesion or rash.  Neurological:     General: No focal deficit present.     Mental Status: He is alert and oriented to person, place, and time.  Psychiatric:        Mood and Affect: Mood normal.        Speech: Speech normal.        Behavior:  Behavior normal.        Thought Content: Thought content normal.        Judgment: Judgment normal.     Results for orders placed or performed in visit on 10/25/18  Comprehensive metabolic panel  Result Value Ref Range   Glucose 101 (H) 65 - 99 mg/dL   BUN 17 8 - 27 mg/dL   Creatinine, Ser 0.92 0.76 - 1.27 mg/dL   GFR calc non Af Amer 89 >59 mL/min/1.73   GFR calc Af Amer 103 >59 mL/min/1.73   BUN/Creatinine Ratio 18 10 - 24   Sodium 143 134 - 144 mmol/L   Potassium 4.4 3.5 - 5.2 mmol/L   Chloride 106 96 - 106 mmol/L   CO2 21 20 - 29 mmol/L   Calcium 9.6 8.6 - 10.2 mg/dL   Total Protein 7.0 6.0 - 8.5 g/dL   Albumin 4.7 3.8 - 4.8 g/dL   Globulin, Total 2.3 1.5 - 4.5 g/dL   Albumin/Globulin Ratio 2.0 1.2 - 2.2   Bilirubin Total 0.4 0.0 - 1.2 mg/dL   Alkaline Phosphatase 61 39 - 117 IU/L   AST 49 (H) 0 - 40 IU/L   ALT 64 (H) 0 - 44 IU/L  Lipid Panel w/o Chol/HDL Ratio  Result Value Ref Range   Cholesterol, Total 187 100 - 199 mg/dL   Triglycerides 108 0 - 149 mg/dL   HDL 41 >39 mg/dL   VLDL Cholesterol Cal 22 5 - 40 mg/dL   LDL Calculated 124 (H) 0 - 99 mg/dL  CBC with Differential/Platelet  Result Value Ref Range   WBC 5.7 3.4 - 10.8 x10E3/uL   RBC 5.23 4.14 - 5.80 x10E6/uL   Hemoglobin 15.8 13.0 - 17.7 g/dL   Hematocrit 47.8 37.5 - 51.0 %   MCV 91 79 - 97 fL   MCH 30.2 26.6 - 33.0 pg   MCHC 33.1 31.5 - 35.7 g/dL   RDW 13.6 11.6 - 15.4 %   Platelets 196 150 - 450 x10E3/uL   Neutrophils 46 Not Estab. %   Lymphs 37 Not Estab. %   Monocytes 12 Not Estab. %   Eos 4 Not Estab. %   Basos 1 Not Estab. %   Neutrophils Absolute 2.7 1.4 - 7.0 x10E3/uL   Lymphocytes Absolute 2.1 0.7 - 3.1 x10E3/uL   Monocytes Absolute 0.7 0.1 - 0.9 x10E3/uL   EOS (ABSOLUTE) 0.2 0.0 - 0.4 x10E3/uL   Basophils Absolute 0.0 0.0 - 0.2 x10E3/uL   Immature Granulocytes 0 Not Estab. %   Immature Grans (Abs) 0.0 0.0 - 0.1 x10E3/uL      Assessment & Plan:   Problem  List Items Addressed This  Visit    None    Visit Diagnoses    Acute anxiety    -  Primary   Under a lot of stress. Will start him on hydroxyzine. Rx sent to his pharmacy. Recheck 2 weeks. Call with any concerns.    Relevant Medications   hydrOXYzine (VISTARIL) 100 MG capsule       Follow up plan: Return in about 2 weeks (around 11/23/2018) for Follow up anxiety.   . This visit was completed via FaceTime due to the restrictions of the COVID-19 pandemic. All issues as above were discussed and addressed. Physical exam was done as above through visual confirmation on FaceTime. If it was felt that the patient should be evaluated in the office, they were directed there. The patient verbally consented to this visit. . Location of the patient: home . Location of the provider: home . Those involved with this call:  . Provider: Park Liter, DO . CMA: Merilyn Baba, CMA . Front Desk/Registration: Jill Side  . Time spent on call: 15 minutes with patient face to face via video conference. More than 50% of this time was spent in counseling and coordination of care.

## 2018-11-14 ENCOUNTER — Encounter: Payer: Self-pay | Admitting: Family Medicine

## 2018-11-15 MED ORDER — HYDROXYZINE HCL 50 MG PO TABS: 50.0000 mg | ORAL_TABLET | Freq: Three times a day (TID) | ORAL | 3 refills | Status: DC | PRN

## 2018-11-15 NOTE — Telephone Encounter (Signed)
[address] has 50 mg tablets, no 100 mg in stock.

## 2018-11-17 ENCOUNTER — Encounter: Payer: Self-pay | Admitting: Family Medicine

## 2018-11-20 ENCOUNTER — Encounter: Payer: Self-pay | Admitting: Family Medicine

## 2018-11-20 ENCOUNTER — Other Ambulatory Visit: Payer: Self-pay | Admitting: Family Medicine

## 2018-11-20 MED ORDER — FLUTICASONE PROPIONATE 50 MCG/ACT NA SUSP: 1.0000 | Freq: Two times a day (BID) | NASAL | 12 refills | Status: DC

## 2018-11-24 ENCOUNTER — Other Ambulatory Visit: Payer: Self-pay

## 2018-11-24 ENCOUNTER — Encounter: Payer: Self-pay | Admitting: Family Medicine

## 2018-11-24 ENCOUNTER — Ambulatory Visit (INDEPENDENT_AMBULATORY_CARE_PROVIDER_SITE_OTHER): Payer: PPO | Admitting: Family Medicine

## 2018-11-24 VITALS — BP 114/64 | HR 67 | Temp 94.7°F

## 2018-11-24 DIAGNOSIS — F419 Anxiety disorder, unspecified: Secondary | ICD-10-CM

## 2018-11-24 NOTE — Progress Notes (Signed)
BP 114/64 Comment: pt reported- virtual visit  Pulse 67 Comment: pt reported- virtual visit  Temp (!) 94.7 F (34.8 C) (Oral) Comment: pt reported- virtual visit   Subjective:    Patient ID: Derrick Ball., male    DOB: June 23, 1956, 63 y.o.   MRN: [id number]  HPI: Derrick Lovern. is a 63 y.o. male  Chief Complaint  Patient presents with  . Anxiety   ANXIETY/STRESS- likes the hydroxyzine. It's really helping. Notes that he is feeling well on it and feeling more like himself again. Duration:better Anxious mood: yes  Excessive worrying: no Irritability: no  Sweating: no Nausea: no Palpitations:no Hyperventilation: no Panic attacks: no Agoraphobia: no  Obscessions/compulsions: no Depressed mood: no Depression screen [facility] 2/9 11/24/2018 10/25/2018 04/21/2018 06/06/2017 02/22/2017  Decreased Interest 0 0 0 0 0  Down, Depressed, Hopeless 0 0 0 0 0  PHQ - 2 Score 0 0 0 0 0  Altered sleeping 0 3 - - 3  Tired, decreased energy 0 2 - - 3  Change in appetite 0 2 - - 0  Feeling bad or failure about yourself  0 0 - - 0  Trouble concentrating 1 2 - - 0  Moving slowly or fidgety/restless 0 0 - - 0  Suicidal thoughts 0 0 - - 0  PHQ-9 Score 1 9 - - 6  Difficult doing work/chores Not difficult at all Somewhat difficult - - -   GAD 7 : Generalized Anxiety Score 11/24/2018  Nervous, Anxious, on Edge 0  Control/stop worrying 0  Worry too much - different things 0  Trouble relaxing 0  Restless 0  Easily annoyed or irritable 1  Afraid - awful might happen 0  Total GAD 7 Score 1  Anxiety Difficulty Not difficult at all   Anhedonia: no Weight changes: no Insomnia: no   Hypersomnia: no Fatigue/loss of energy: no Feelings of worthlessness: no Feelings of guilt: no Impaired concentration/indecisiveness: no Suicidal ideations: no  Crying spells: no Recent Stressors/Life Changes: yes   Relationship problems: yes   Family stress: yes     Financial stress: yes    Job stress: no     Recent death/loss: no   Relevant past medical, surgical, family and social history reviewed and updated as indicated. Interim medical history since our last visit reviewed. Allergies and medications reviewed and updated.  Review of Systems  Constitutional: Negative.   Respiratory: Negative.   Cardiovascular: Negative.   Psychiatric/Behavioral: Negative for agitation, behavioral problems, confusion, decreased concentration, dysphoric mood, hallucinations, self-injury and sleep disturbance. The patient is nervous/anxious. The patient is not hyperactive.     Per HPI unless specifically indicated above     Objective:    BP 114/64 Comment: pt reported- virtual visit  Pulse 67 Comment: pt reported- virtual visit  Temp (!) 94.7 F (34.8 C) (Oral) Comment: pt reported- virtual visit  Wt Readings from Last 3 Encounters:  11/09/18 275 lb (124.7 kg)  10/25/18 278 lb 6 oz (126.3 kg)  10/02/18 281 lb (127.5 kg)    Physical Exam Vitals signs and nursing note reviewed.  Constitutional:      General: He is not in acute distress.    Appearance: Normal appearance. He is not ill-appearing, toxic-appearing or diaphoretic.  HENT:     Head: Normocephalic and atraumatic.     Right Ear: External ear normal.     Left Ear: External ear normal.     Nose: Nose normal.     Mouth/Throat:  Mouth: Mucous membranes are moist.     Pharynx: Oropharynx is clear.  Eyes:     General: No scleral icterus.       Right eye: No discharge.        Left eye: No discharge.     Conjunctiva/sclera: Conjunctivae normal.     Pupils: Pupils are equal, round, and reactive to light.  Neck:     Musculoskeletal: Normal range of motion.  Pulmonary:     Effort: Pulmonary effort is normal. No respiratory distress.     Comments: Speaking in full sentences Musculoskeletal: Normal range of motion.  Skin:    Coloration: Skin is not jaundiced or pale.     Findings: No bruising, erythema, lesion or rash.  Neurological:      Mental Status: He is alert and oriented to person, place, and time. Mental status is at baseline.  Psychiatric:        Mood and Affect: Mood normal.        Behavior: Behavior normal.        Thought Content: Thought content normal.        Judgment: Judgment normal.     Results for orders placed or performed in visit on 10/25/18  Comprehensive metabolic panel  Result Value Ref Range   Glucose 101 (H) 65 - 99 mg/dL   BUN 17 8 - 27 mg/dL   Creatinine, Ser 0.92 0.76 - 1.27 mg/dL   GFR calc non Af Amer 89 >59 mL/min/1.73   GFR calc Af Amer 103 >59 mL/min/1.73   BUN/Creatinine Ratio 18 10 - 24   Sodium 143 134 - 144 mmol/L   Potassium 4.4 3.5 - 5.2 mmol/L   Chloride 106 96 - 106 mmol/L   CO2 21 20 - 29 mmol/L   Calcium 9.6 8.6 - 10.2 mg/dL   Total Protein 7.0 6.0 - 8.5 g/dL   Albumin 4.7 3.8 - 4.8 g/dL   Globulin, Total 2.3 1.5 - 4.5 g/dL   Albumin/Globulin Ratio 2.0 1.2 - 2.2   Bilirubin Total 0.4 0.0 - 1.2 mg/dL   Alkaline Phosphatase 61 39 - 117 IU/L   AST 49 (H) 0 - 40 IU/L   ALT 64 (H) 0 - 44 IU/L  Lipid Panel w/o Chol/HDL Ratio  Result Value Ref Range   Cholesterol, Total 187 100 - 199 mg/dL   Triglycerides 108 0 - 149 mg/dL   HDL 41 >39 mg/dL   VLDL Cholesterol Cal 22 5 - 40 mg/dL   LDL Calculated 124 (H) 0 - 99 mg/dL  CBC with Differential/Platelet  Result Value Ref Range   WBC 5.7 3.4 - 10.8 x10E3/uL   RBC 5.23 4.14 - 5.80 x10E6/uL   Hemoglobin 15.8 13.0 - 17.7 g/dL   Hematocrit 47.8 37.5 - 51.0 %   MCV 91 79 - 97 fL   MCH 30.2 26.6 - 33.0 pg   MCHC 33.1 31.5 - 35.7 g/dL   RDW 13.6 11.6 - 15.4 %   Platelets 196 150 - 450 x10E3/uL   Neutrophils 46 Not Estab. %   Lymphs 37 Not Estab. %   Monocytes 12 Not Estab. %   Eos 4 Not Estab. %   Basos 1 Not Estab. %   Neutrophils Absolute 2.7 1.4 - 7.0 x10E3/uL   Lymphocytes Absolute 2.1 0.7 - 3.1 x10E3/uL   Monocytes Absolute 0.7 0.1 - 0.9 x10E3/uL   EOS (ABSOLUTE) 0.2 0.0 - 0.4 x10E3/uL   Basophils Absolute 0.0  0.0 - 0.2 x10E3/uL  Immature Granulocytes 0 Not Estab. %   Immature Grans (Abs) 0.0 0.0 - 0.1 x10E3/uL      Assessment & Plan:   Problem List Items Addressed This Visit    None    Visit Diagnoses    Acute anxiety    -  Primary   Doing well with the hydroxyzine. Hanging in there. Doing much better. Call with any concerns.        Follow up plan: Return in about 5 months (around 04/26/2019) for 6 month follow up.   . This visit was completed via FaceTime due to the restrictions of the COVID-19 pandemic. All issues as above were discussed and addressed. Physical exam was done as above through visual confirmation on FaceTime. If it was felt that the patient should be evaluated in the office, they were directed there. The patient verbally consented to this visit. . Location of the patient: home . Location of the provider: home . Those involved with this call:  . Provider: Park Liter, DO . CMA: Yvonna Alanis, [place] . Front Desk/Registration: Linard Millers  . Time spent on call: 15 minutes with patient face to face via video conference. More than 50% of this time was spent in counseling and coordination of care. 23 minutes total spent in review of patient's record and preparation of their chart.

## 2018-12-01 ENCOUNTER — Encounter: Payer: Self-pay | Admitting: Family Medicine

## 2018-12-01 MED ORDER — LORATADINE 10 MG PO TABS: 10.0000 mg | ORAL_TABLET | Freq: Every day | ORAL | 11 refills | Status: DC

## 2018-12-13 ENCOUNTER — Other Ambulatory Visit: Payer: Self-pay | Admitting: Family Medicine

## 2018-12-13 NOTE — Telephone Encounter (Signed)
Please advise 

## 2019-01-08 DIAGNOSIS — R339 Retention of urine, unspecified: Secondary | ICD-10-CM | POA: Diagnosis not present

## 2019-01-08 DIAGNOSIS — N401 Enlarged prostate with lower urinary tract symptoms: Secondary | ICD-10-CM | POA: Diagnosis not present

## 2019-01-08 DIAGNOSIS — N319 Neuromuscular dysfunction of bladder, unspecified: Secondary | ICD-10-CM | POA: Diagnosis not present

## 2019-01-08 DIAGNOSIS — N35919 Unspecified urethral stricture, male, unspecified site: Secondary | ICD-10-CM | POA: Diagnosis not present

## 2019-01-17 ENCOUNTER — Encounter: Payer: Self-pay | Admitting: Family Medicine

## 2019-01-22 NOTE — Telephone Encounter (Signed)
Called pt no answer °

## 2019-01-24 ENCOUNTER — Encounter: Payer: Self-pay | Admitting: Family Medicine

## 2019-01-25 ENCOUNTER — Ambulatory Visit: Payer: PPO | Admitting: Family Medicine

## 2019-02-01 ENCOUNTER — Encounter: Payer: Self-pay | Admitting: Family Medicine

## 2019-02-02 ENCOUNTER — Ambulatory Visit (INDEPENDENT_AMBULATORY_CARE_PROVIDER_SITE_OTHER): Payer: PPO | Admitting: Nurse Practitioner

## 2019-02-02 ENCOUNTER — Other Ambulatory Visit: Payer: Self-pay

## 2019-02-02 ENCOUNTER — Encounter: Payer: Self-pay | Admitting: Nurse Practitioner

## 2019-02-02 DIAGNOSIS — J069 Acute upper respiratory infection, unspecified: Secondary | ICD-10-CM

## 2019-02-02 DIAGNOSIS — L237 Allergic contact dermatitis due to plants, except food: Secondary | ICD-10-CM

## 2019-02-02 MED ORDER — BETAMETHASONE DIPROPIONATE AUG 0.05 % EX LOTN: TOPICAL_LOTION | Freq: Two times a day (BID) | CUTANEOUS | 0 refills | Status: DC

## 2019-02-02 MED ORDER — PREDNISONE 10 MG PO TABS: ORAL_TABLET | ORAL | 0 refills | Status: DC

## 2019-02-02 NOTE — Patient Instructions (Signed)
Poison Ivy Dermatitis  Poison ivy dermatitis is redness and soreness (inflammation) of the skin. It is caused by a chemical that is found on the leaves of the poison ivy plant. You may also have itching, a rash, and blisters. Symptoms often clear up in 1-2 weeks. You may get this condition by touching a poison ivy plant. You can also get it by touching something that has the chemical on it. This may include animals or objects that have come in contact with the plant. Follow these instructions at home: General instructions  Take or apply over-the-counter and prescription medicines only as told by your doctor.  If you touch poison ivy, wash your skin with soap and cold water right away.  Use hydrocortisone creams or calamine lotion as needed to help with itching.  Take oatmeal baths as needed. Use colloidal oatmeal. You can get this at a pharmacy or grocery store. Follow the instructions on the package.  Do not scratch or rub your skin.  While you have the rash, wash your clothes right after you wear them. Prevention   Know what poison ivy looks like so you can avoid it. This plant has three leaves with flowering branches on a single stem. The leaves are glossy. They have uneven edges that come to a point at the front.  If you have touched poison ivy, wash with soap and water right away. Be sure to wash under your fingernails.  When hiking or camping, wear long pants, a long-sleeved shirt, tall socks, and hiking boots. You can also use a lotion on your skin that helps to prevent contact with the chemical on the plant.  If you think that your clothes or outdoor gear came in contact with poison ivy, rinse them off with a garden hose before you bring them inside your house. Contact a doctor if:  You have open sores in the rash area.  You have more redness, swelling, or pain in the affected area.  You have redness that spreads beyond the rash area.  You have fluid, blood, or pus coming  from the affected area.  You have a fever.  You have a rash over a large area of your body.  You have a rash on your eyes, mouth, or genitals.  Your rash does not get better after a few days. Get help right away if:  Your face swells or your eyes swell shut.  You have trouble breathing.  You have trouble swallowing. This information is not intended to replace advice given to you by your health care provider. Make sure you discuss any questions you have with your health care provider. Document Released: 09/04/2010 Document Revised: 04/26/2018 Document Reviewed: 01/08/2015 Elsevier Interactive Patient Education  2019 [employer].

## 2019-02-02 NOTE — Assessment & Plan Note (Signed)
Acute, may benefit from taper of Prednisone prescribed for rash.  Recommend continued simple treatment.  Continue Singulair.   - Increased rest - Increasing Fluids - Acetaminophen / ibuprofen as needed for fever/pain.  - Salt water gargling, chloraseptic spray and throat lozenges - Mucinex.  - Saline sinus flushes or a neti pot.  - Humidifying the air.

## 2019-02-02 NOTE — Assessment & Plan Note (Signed)
Acute, not improving with home treatment.  Scripts for Prednisone and lotion sent.  Recommend good washing of clothes and bed sheets.  Can continue to use Calamine as needed.  Return for worsening or continued symptoms.

## 2019-02-02 NOTE — Progress Notes (Signed)
BP (!) 153/82   Pulse 79   Temp 97.7 F (36.5 C)   Wt 254 lb (115.2 kg)   BMI 39.78 kg/m    Subjective:    Patient ID: Derrick Ball., male    DOB: 1956/02/16, 63 y.o.   MRN: [id number]  HPI: Derrick Beckner. is a 63 y.o. male  Chief Complaint  Patient presents with  . URI  . Rash    comes up when he sweats, and itches, had poision ivey on chest but was able to get rid of that, unsure if this is poision ivey or not    . This visit was completed via WebEx due to the restrictions of the COVID-19 pandemic. All issues as above were discussed and addressed. Physical exam was done as above through visual confirmation on WebEx. If it was felt that the patient should be evaluated in the office, they were directed there. The patient verbally consented to this visit. . Location of the patient: home . Location of the provider: home . Those involved with this call:  . Provider: Marnee Guarneri, DNP . CMA: Tiffany Reel, CMA . Front Desk/Registration: Jill Side  . Time spent on call: 15 minutes with patient face to face via video conference. More than 50% of this time was spent in counseling and coordination of care. 10 minutes total spent in review of patient's record and preparation of their chart. I verified patient identity using two factors (patient name and date of birth). Patient consents verbally to being seen via telemedicine visit today.   UPPER RESPIRATORY TRACT INFECTION Reports he has had a bit of a headache on and off and has been tired with some sinus congestion.  Started at the beginning of this week.   Worst symptom: sinus headache Fever: no Cough: no Shortness of breath: no Wheezing: no Chest pain: yes, with cough Chest tightness: no Chest congestion: no Nasal congestion: yes Runny nose: yes Post nasal drip: yes Sneezing: no Sore throat: no Swollen glands: no Sinus pressure: yes Headache: no Face pain: no Toothache: no Ear pain: none Ear  pressure: none Eyes red/itching:no Eye drainage/crusting: no  Vomiting: no Rash: yes Fatigue: yes Sick contacts: no Strep contacts: no  Context: fluctuating Recurrent sinusitis: no Relief with OTC cold/cough medications: no  Treatments attempted: Singulair  RASH Has poison ivy he was "putting stuff on" to his left upper chest and right arm.  Thought he had it under control, but then it starting flaring up again.  Worse symptoms when he is hot or sweating.  Tried to treat on own Calamine.  Reports having similar many times before and he thought he could "fix it" on his own. Duration:  weeks  Location: trunk  Itching: yes Burning: no Redness: yes Oozing: no Scaling: no Blisters: no Painful: no Fevers: no Change in detergents/soaps/personal care products: no Recent illness: no Recent travel:no History of same: yes Context: fluctuating Alleviating factors: lotion/moisturizer Treatments attempted:lotion/moisturizer Shortness of breath: no  Throat/tongue swelling: no Myalgias/arthralgias: no  Relevant past medical, surgical, family and social history reviewed and updated as indicated. Interim medical history since our last visit reviewed. Allergies and medications reviewed and updated.  Review of Systems  Constitutional: Negative for activity change, chills, diaphoresis, fatigue and fever.  HENT: Positive for congestion and sinus pressure. Negative for ear discharge, ear pain, nosebleeds, postnasal drip, rhinorrhea, sinus pain, sneezing and voice change.   Respiratory: Negative for cough, chest tightness, shortness of breath and wheezing.  Cardiovascular: Negative for chest pain, palpitations and leg swelling.  Gastrointestinal: Negative for abdominal distention, abdominal pain, constipation, diarrhea, nausea and vomiting.  Endocrine: Negative for cold intolerance, heat intolerance, polydipsia, polyphagia and polyuria.  Musculoskeletal: Negative.   Skin: Positive for rash.   Neurological: Negative for dizziness, syncope, weakness, light-headedness, numbness and headaches.  Psychiatric/Behavioral: Negative.     Per HPI unless specifically indicated above     Objective:    BP (!) 153/82   Pulse 79   Temp 97.7 F (36.5 C)   Wt 254 lb (115.2 kg)   BMI 39.78 kg/m   Wt Readings from Last 3 Encounters:  02/02/19 254 lb (115.2 kg)  11/09/18 275 lb (124.7 kg)  10/25/18 278 lb 6 oz (126.3 kg)    Physical Exam Vitals signs and nursing note reviewed.  Constitutional:      General: He is awake. He is not in acute distress.    Appearance: He is well-developed. He is not ill-appearing.  HENT:     Head: Normocephalic.     Right Ear: Hearing normal. No drainage.     Left Ear: Hearing normal. No drainage.  Eyes:     General: Lids are normal.        Right eye: No discharge.        Left eye: No discharge.     Conjunctiva/sclera: Conjunctivae normal.  Neck:     Musculoskeletal: Normal range of motion.  Cardiovascular:     Comments: Unable to auscultate due to virtual exam only Pulmonary:     Effort: Pulmonary effort is normal. No accessory muscle usage or respiratory distress.     Comments: Unable to auscultate due to virtual exam only.  Talkative without SOB. Skin:    Findings: Rash present.     Comments: Linear patches of flat erythema noted to upper left chest, lower abdomen, and right arm.  Areas with mild scaling noted via video visit. No vesicles or blisters noted.  Neurological:     Mental Status: He is alert and oriented to person, place, and time.  Psychiatric:        Mood and Affect: Mood normal.        Behavior: Behavior normal. Behavior is cooperative.        Thought Content: Thought content normal.        Judgment: Judgment normal.     Results for orders placed or performed in visit on 10/25/18  Comprehensive metabolic panel  Result Value Ref Range   Glucose 101 (H) 65 - 99 mg/dL   BUN 17 8 - 27 mg/dL   Creatinine, Ser 0.92 0.76 -  1.27 mg/dL   GFR calc non Af Amer 89 >59 mL/min/1.73   GFR calc Af Amer 103 >59 mL/min/1.73   BUN/Creatinine Ratio 18 10 - 24   Sodium 143 134 - 144 mmol/L   Potassium 4.4 3.5 - 5.2 mmol/L   Chloride 106 96 - 106 mmol/L   CO2 21 20 - 29 mmol/L   Calcium 9.6 8.6 - 10.2 mg/dL   Total Protein 7.0 6.0 - 8.5 g/dL   Albumin 4.7 3.8 - 4.8 g/dL   Globulin, Total 2.3 1.5 - 4.5 g/dL   Albumin/Globulin Ratio 2.0 1.2 - 2.2   Bilirubin Total 0.4 0.0 - 1.2 mg/dL   Alkaline Phosphatase 61 39 - 117 IU/L   AST 49 (H) 0 - 40 IU/L   ALT 64 (H) 0 - 44 IU/L  Lipid Panel w/o Chol/HDL Ratio  Result Value  Ref Range   Cholesterol, Total 187 100 - 199 mg/dL   Triglycerides 108 0 - 149 mg/dL   HDL 41 >39 mg/dL   VLDL Cholesterol Cal 22 5 - 40 mg/dL   LDL Calculated 124 (H) 0 - 99 mg/dL  CBC with Differential/Platelet  Result Value Ref Range   WBC 5.7 3.4 - 10.8 x10E3/uL   RBC 5.23 4.14 - 5.80 x10E6/uL   Hemoglobin 15.8 13.0 - 17.7 g/dL   Hematocrit 47.8 37.5 - 51.0 %   MCV 91 79 - 97 fL   MCH 30.2 26.6 - 33.0 pg   MCHC 33.1 31.5 - 35.7 g/dL   RDW 13.6 11.6 - 15.4 %   Platelets 196 150 - 450 x10E3/uL   Neutrophils 46 Not Estab. %   Lymphs 37 Not Estab. %   Monocytes 12 Not Estab. %   Eos 4 Not Estab. %   Basos 1 Not Estab. %   Neutrophils Absolute 2.7 1.4 - 7.0 x10E3/uL   Lymphocytes Absolute 2.1 0.7 - 3.1 x10E3/uL   Monocytes Absolute 0.7 0.1 - 0.9 x10E3/uL   EOS (ABSOLUTE) 0.2 0.0 - 0.4 x10E3/uL   Basophils Absolute 0.0 0.0 - 0.2 x10E3/uL   Immature Granulocytes 0 Not Estab. %   Immature Grans (Abs) 0.0 0.0 - 0.1 x10E3/uL      Assessment & Plan:   Problem List Items Addressed This Visit      Respiratory   URI (upper respiratory infection)    Acute, may benefit from taper of Prednisone prescribed for rash.  Recommend continued simple treatment.  Continue Singulair.   - Increased rest - Increasing Fluids - Acetaminophen / ibuprofen as needed for fever/pain.  - Salt water gargling,  chloraseptic spray and throat lozenges - Mucinex.  - Saline sinus flushes or a neti pot.  - Humidifying the air.         Musculoskeletal and Integument   Poison ivy dermatitis    Acute, not improving with home treatment.  Scripts for Prednisone and lotion sent.  Recommend good washing of clothes and bed sheets.  Can continue to use Calamine as needed.  Return for worsening or continued symptoms.         I discussed the assessment and treatment plan with the patient. The patient was provided an opportunity to ask questions and all were answered. The patient agreed with the plan and demonstrated an understanding of the instructions.   The patient was advised to call back or seek an in-person evaluation if the symptoms worsen or if the condition fails to improve as anticipated.   I provided 15 minutes of time during this encounter.  Follow up plan: Return if symptoms worsen or fail to improve.

## 2019-02-18 ENCOUNTER — Encounter: Payer: Self-pay | Admitting: Family Medicine

## 2019-02-19 ENCOUNTER — Other Ambulatory Visit: Payer: Self-pay

## 2019-02-19 ENCOUNTER — Encounter: Payer: Self-pay | Admitting: Family Medicine

## 2019-02-19 ENCOUNTER — Encounter: Payer: Self-pay | Admitting: Nurse Practitioner

## 2019-02-19 ENCOUNTER — Ambulatory Visit (INDEPENDENT_AMBULATORY_CARE_PROVIDER_SITE_OTHER): Payer: PPO | Admitting: Nurse Practitioner

## 2019-02-19 DIAGNOSIS — L237 Allergic contact dermatitis due to plants, except food: Secondary | ICD-10-CM

## 2019-02-19 DIAGNOSIS — B029 Zoster without complications: Secondary | ICD-10-CM | POA: Diagnosis not present

## 2019-02-19 NOTE — Patient Instructions (Signed)
Acyclovir ==== 800 mg 5 times daily for 7 days  Shingles  Shingles is an infection. It gives you a painful skin rash and blisters that have fluid in them. Shingles is caused by the same germ (virus) that causes chickenpox. Shingles only happens in people who:  Have had chickenpox.  Have been given a shot of medicine (vaccine) to protect against chickenpox. Shingles is rare in this group. The first symptoms of shingles may be itching, tingling, or pain in an area on your skin. A rash will show on your skin a few days or weeks later. The rash is likely to be on one side of your body. The rash usually has a shape like a belt or a band. Over time, the rash turns into fluid-filled blisters. The blisters will break open, change into scabs, and dry up. Medicines may:  Help with pain and itching.  Help you get better sooner.  Help to prevent long-term problems. Follow these instructions at home: Medicines  Take over-the-counter and prescription medicines only as told by your doctor.  Put on an anti-itch cream or numbing cream where you have a rash, blisters, or scabs. Do this as told by your doctor. Helping with itching and discomfort   Put cold, wet cloths (cold compresses) on the area of the rash or blisters as told by your doctor.  Cool baths can help you feel better. Try adding baking soda or dry oatmeal to the water to lessen itching. Do not bathe in hot water. Blister and rash care  Keep your rash covered with a loose bandage (dressing).  Wear loose clothing that does not rub on your rash.  Keep your rash and blisters clean. To do this, wash the area with mild soap and cool water as told by your doctor.  Check your rash every day for signs of infection. Check for: ? More redness, swelling, or pain. ? Fluid or blood. ? Warmth. ? Pus or a bad smell.  Do not scratch your rash. Do not pick at your blisters. To help you to not scratch: ? Keep your fingernails clean and cut short.  ? Wear gloves or mittens when you sleep, if scratching is a problem. General instructions  Rest as told by your doctor.  Keep all follow-up visits as told by your doctor. This is important.  Wash your hands often with soap and water. If soap and water are not available, use hand sanitizer. Doing this lowers your chance of getting a skin infection caused by germs (bacteria).  Your infection can cause chickenpox in people who have never had chickenpox or never got a shot of chickenpox vaccine. If you have blisters that did not change into scabs yet, try not to touch other people or be around other people, especially: ? Babies. ? Pregnant women. ? Children who have areas of red, itchy, or rough skin (eczema). ? Very old people who have transplants. ? People who have a long-term (chronic) sickness, like cancer or AIDS. Contact a doctor if:  Your pain does not get better with medicine.  Your pain does not get better after the rash heals.  You have any signs of infection in the rash area. These signs include: ? More redness, swelling, or pain around the rash. ? Fluid or blood coming from the rash. ? The rash area feeling warm to the touch. ? Pus or a bad smell coming from the rash. Get help right away if:  The rash is on your face or  nose.  You have pain in your face or pain by your eye.  You lose feeling on one side of your face.  You have trouble seeing.  You have ear pain, or you have ringing in your ear.  You have a loss of taste.  Your condition gets worse. Summary  Shingles gives you a painful skin rash and blisters that have fluid in them.  Shingles is an infection. It is caused by the same germ (virus) that causes chickenpox.  Keep your rash covered with a loose bandage (dressing). Wear loose clothing that does not rub on your rash.  If you have blisters that did not change into scabs yet, try not to touch other people or be around people. This information is not  intended to replace advice given to you by your health care provider. Make sure you discuss any questions you have with your health care provider. Document Released: 01/19/2008 Document Revised: 11/24/2018 Document Reviewed: 04/06/2017 Elsevier Patient Education  2020 [employer].

## 2019-02-19 NOTE — Assessment & Plan Note (Signed)
Areas of rash to extremities, abdomen, and occiput appear like shingles in presentation.  Have recommended he increase his Acyclovir to 800 MG five times a day for 7 days per shingles treatment and then return to 400 MG BID per previous dosing.  Return to office for no improvement or worsening symptoms.

## 2019-02-19 NOTE — Assessment & Plan Note (Signed)
Continue Prednisone taper and Kenalog cream as ordered.  Return for worsening or continued issues.

## 2019-02-19 NOTE — Progress Notes (Addendum)
BP (!) 156/81   Pulse 82   Temp 98.3 F (36.8 C) (Oral)   SpO2 96%    Subjective:    Patient ID: Derrick Ball., male    DOB: [birth year], 63 y.o.   MRN: [id number]  HPI: Derrick Lotz. is a 63 y.o. male  Chief Complaint  Patient presents with  . Rash    Pt has not yet completed prednisode RX. States that it look like it could be 2 different possible rashs   RASH Seen via virtual visit for poison ivy dermatitis 02/02/19.  Was provided script for Prednisone and lotion.  Has not completed Prednisone as of yet. States he did not start this right away because he knew he had more yard work to do.  Has not had shingles vaccine.  States he has new areas on back of head and under right arm, that he reports look different then the poison ivy rash.  He reports the new areas are more burning/tingling, the poison ivy is more pruritic.  Currently takes Zovirax 400 MG BID for genital herpes. Duration:  weeks  Location: generalized  Itching: yes Burning: yes Redness: no Oozing: no Scaling: yes Blisters: no Painful: no Fevers: no Change in detergents/soaps/personal care products: no Recent illness: no Recent travel:no History of same: no Context: stable Alleviating factors: prednisone and cream Treatments attempted:prednisone and Kenalog ointment Shortness of breath: no  Throat/tongue swelling: no Myalgias/arthralgias: no  Relevant past medical, surgical, family and social history reviewed and updated as indicated. Interim medical history since our last visit reviewed. Allergies and medications reviewed and updated.  Review of Systems  Constitutional: Negative for activity change, diaphoresis, fatigue and fever.  Respiratory: Negative for cough, chest tightness, shortness of breath and wheezing.   Cardiovascular: Negative for chest pain, palpitations and leg swelling.  Gastrointestinal: Negative for abdominal distention, abdominal pain, constipation, diarrhea, nausea and  vomiting.  Skin: Positive for rash.  Neurological: Negative for dizziness, syncope, weakness, light-headedness, numbness and headaches.  Psychiatric/Behavioral: Negative.     Per HPI unless specifically indicated above     Objective:    BP (!) 156/81   Pulse 82   Temp 98.3 F (36.8 C) (Oral)   SpO2 96%   Wt Readings from Last 3 Encounters:  02/02/19 254 lb (115.2 kg)  11/09/18 275 lb (124.7 kg)  10/25/18 278 lb 6 oz (126.3 kg)    Physical Exam Vitals signs and nursing note reviewed.  Constitutional:      General: He is awake. He is not in acute distress.    Appearance: He is well-developed. He is obese. He is not ill-appearing.  HENT:     Head: Normocephalic and atraumatic.     Right Ear: Hearing normal. No drainage.     Left Ear: Hearing normal. No drainage.     Mouth/Throat:     Pharynx: Uvula midline.  Eyes:     General: Lids are normal.        Right eye: No discharge.        Left eye: No discharge.     Conjunctiva/sclera: Conjunctivae normal.     Pupils: Pupils are equal, round, and reactive to light.  Neck:     Musculoskeletal: Normal range of motion and neck supple.     Thyroid: No thyromegaly.     Vascular: No carotid bruit or JVD.     Trachea: Trachea normal.  Cardiovascular:     Rate and Rhythm: Normal rate and regular rhythm.  Heart sounds: Normal heart sounds, S1 normal and S2 normal. No murmur. No gallop.   Pulmonary:     Effort: Pulmonary effort is normal. No accessory muscle usage or respiratory distress.     Breath sounds: Normal breath sounds.  Abdominal:     General: Bowel sounds are normal.     Palpations: Abdomen is soft. There is no hepatomegaly or splenomegaly.  Musculoskeletal: Normal range of motion.     Right lower leg: No edema.     Left lower leg: No edema.  Skin:    General: Skin is warm and dry.     Capillary Refill: Capillary refill takes less than 2 seconds.     Findings: Rash present.     Comments: Linear patches of linear  erythema noted to upper right inner arm.  Areas with mild scaling noted.  To lower left leg, under aspect of right arm, lower abdomen, and posterior occiput noted small, raised areas of erythema in cluster.  No vesicles lower left leg, abdomen, or occiput, but small vesicles noted to area under right arm.  He reports these areas as burning/tingling + linear area as pruritic.  No tenderness, warmth, or edema noted.  Neurological:     General: No focal deficit present.     Mental Status: He is alert and oriented to person, place, and time.     Deep Tendon Reflexes: Reflexes are normal and symmetric.  Psychiatric:        Mood and Affect: Mood normal.        Behavior: Behavior normal. Behavior is cooperative.        Thought Content: Thought content normal.        Judgment: Judgment normal.     Results for orders placed or performed in visit on 10/25/18  Comprehensive metabolic panel  Result Value Ref Range   Glucose 101 (H) 65 - 99 mg/dL   BUN 17 8 - 27 mg/dL   Creatinine, Ser 0.92 0.76 - 1.27 mg/dL   GFR calc non Af Amer 89 >59 mL/min/1.73   GFR calc Af Amer 103 >59 mL/min/1.73   BUN/Creatinine Ratio 18 10 - 24   Sodium 143 134 - 144 mmol/L   Potassium 4.4 3.5 - 5.2 mmol/L   Chloride 106 96 - 106 mmol/L   CO2 21 20 - 29 mmol/L   Calcium 9.6 8.6 - 10.2 mg/dL   Total Protein 7.0 6.0 - 8.5 g/dL   Albumin 4.7 3.8 - 4.8 g/dL   Globulin, Total 2.3 1.5 - 4.5 g/dL   Albumin/Globulin Ratio 2.0 1.2 - 2.2   Bilirubin Total 0.4 0.0 - 1.2 mg/dL   Alkaline Phosphatase 61 39 - 117 IU/L   AST 49 (H) 0 - 40 IU/L   ALT 64 (H) 0 - 44 IU/L  Lipid Panel w/o Chol/HDL Ratio  Result Value Ref Range   Cholesterol, Total 187 100 - 199 mg/dL   Triglycerides 108 0 - 149 mg/dL   HDL 41 >39 mg/dL   VLDL Cholesterol Cal 22 5 - 40 mg/dL   LDL Calculated 124 (H) 0 - 99 mg/dL  CBC with Differential/Platelet  Result Value Ref Range   WBC 5.7 3.4 - 10.8 x10E3/uL   RBC 5.23 4.14 - 5.80 x10E6/uL   Hemoglobin  15.8 13.0 - 17.7 g/dL   Hematocrit 47.8 37.5 - 51.0 %   MCV 91 79 - 97 fL   MCH 30.2 26.6 - 33.0 pg   MCHC 33.1 31.5 - 35.7 g/dL  RDW 13.6 11.6 - 15.4 %   Platelets 196 150 - 450 x10E3/uL   Neutrophils 46 Not Estab. %   Lymphs 37 Not Estab. %   Monocytes 12 Not Estab. %   Eos 4 Not Estab. %   Basos 1 Not Estab. %   Neutrophils Absolute 2.7 1.4 - 7.0 x10E3/uL   Lymphocytes Absolute 2.1 0.7 - 3.1 x10E3/uL   Monocytes Absolute 0.7 0.1 - 0.9 x10E3/uL   EOS (ABSOLUTE) 0.2 0.0 - 0.4 x10E3/uL   Basophils Absolute 0.0 0.0 - 0.2 x10E3/uL   Immature Granulocytes 0 Not Estab. %   Immature Grans (Abs) 0.0 0.0 - 0.1 x10E3/uL      Assessment & Plan:   Problem List Items Addressed This Visit      Musculoskeletal and Integument   Poison ivy dermatitis    Continue Prednisone taper and Kenalog cream as ordered.  Return for worsening or continued issues.          Other   Shingles    Areas of rash to extremities, abdomen, and occiput appear like shingles in presentation.  Have recommended he increase his Acyclovir to 800 MG five times a day for 7 days per shingles treatment and then return to 400 MG BID per previous dosing.  Return to office for no improvement or worsening symptoms.          Follow up plan: Return if symptoms worsen or fail to improve.

## 2019-02-21 DIAGNOSIS — R339 Retention of urine, unspecified: Secondary | ICD-10-CM | POA: Diagnosis not present

## 2019-02-22 ENCOUNTER — Other Ambulatory Visit: Payer: Self-pay | Admitting: Nurse Practitioner

## 2019-02-22 NOTE — Telephone Encounter (Signed)
Please advise 

## 2019-03-03 ENCOUNTER — Encounter: Payer: Self-pay | Admitting: Family Medicine

## 2019-03-05 ENCOUNTER — Encounter: Payer: Self-pay | Admitting: Family Medicine

## 2019-03-05 MED ORDER — FLUTICASONE PROPIONATE 50 MCG/ACT NA SUSP: 1.0000 | Freq: Two times a day (BID) | NASAL | 12 refills | Status: DC

## 2019-03-05 NOTE — Telephone Encounter (Signed)
Pt scheduled  

## 2019-03-06 ENCOUNTER — Other Ambulatory Visit: Payer: Self-pay

## 2019-03-06 ENCOUNTER — Encounter: Payer: Self-pay | Admitting: Family Medicine

## 2019-03-06 ENCOUNTER — Ambulatory Visit (INDEPENDENT_AMBULATORY_CARE_PROVIDER_SITE_OTHER): Payer: PPO | Admitting: Family Medicine

## 2019-03-06 VITALS — BP 116/74 | HR 89 | Temp 98.3°F | Ht 67.0 in | Wt 253.0 lb

## 2019-03-06 DIAGNOSIS — L259 Unspecified contact dermatitis, unspecified cause: Secondary | ICD-10-CM

## 2019-03-06 MED ORDER — TRIAMCINOLONE ACETONIDE 0.5 % EX OINT: 1.0000 "application " | TOPICAL_OINTMENT | Freq: Two times a day (BID) | CUTANEOUS | 3 refills | Status: DC

## 2019-03-06 MED ORDER — TRIAMCINOLONE ACETONIDE 40 MG/ML IJ SUSP
40.0000 mg | Freq: Once | INTRAMUSCULAR | Status: AC
  Administered 2019-03-06: 40 mg via INTRAMUSCULAR

## 2019-03-06 NOTE — Progress Notes (Signed)
BP 116/74   Pulse 89   Temp 98.3 F (36.8 C) (Oral)   Ht 5\' 7"  (1.702 m)   Wt 253 lb (114.8 kg)   SpO2 94%   BMI 39.63 kg/m    Subjective:    Patient ID: Derrick Sandoval., male    DOB: 1955/08/28, 63 y.o.   MRN: [id number]  HPI: Raman Featherston. is a 63 y.o. male  Chief Complaint  Patient presents with  . Rash    on abdomen, arms and back since yesterday. states it's red and itchy    RASH Duration:  yesterday  Location: R side of his abdomen  Itching: yes Burning: yes Redness: yes Oozing: no Scaling: no Blisters: no Painful: no Fevers: no Change in detergents/soaps/personal care products: no Recent illness: no Recent travel:no History of same: no Context: worse Alleviating factors: nothing Treatments attempted:nothing Shortness of breath: no  Throat/tongue swelling: no Myalgias/arthralgias: no  Relevant past medical, surgical, family and social history reviewed and updated as indicated. Interim medical history since our last visit reviewed. Allergies and medications reviewed and updated.  Review of Systems  Constitutional: Negative.   Respiratory: Negative.   Cardiovascular: Negative.   Skin: Positive for rash. Negative for color change, pallor and wound.  Psychiatric/Behavioral: Negative.     Per HPI unless specifically indicated above     Objective:    BP 116/74   Pulse 89   Temp 98.3 F (36.8 C) (Oral)   Ht 5\' 7"  (1.702 m)   Wt 253 lb (114.8 kg)   SpO2 94%   BMI 39.63 kg/m   Wt Readings from Last 3 Encounters:  03/06/19 253 lb (114.8 kg)  02/02/19 254 lb (115.2 kg)  11/09/18 275 lb (124.7 kg)    Physical Exam Vitals signs and nursing note reviewed.  Constitutional:      General: He is not in acute distress.    Appearance: Normal appearance. He is not ill-appearing, toxic-appearing or diaphoretic.  HENT:     Head: Normocephalic and atraumatic.     Right Ear: External ear normal.     Left Ear: External ear normal.     Nose:  Nose normal.     Mouth/Throat:     Mouth: Mucous membranes are moist.     Pharynx: Oropharynx is clear.  Eyes:     General: No scleral icterus.       Right eye: No discharge.        Left eye: No discharge.     Extraocular Movements: Extraocular movements intact.     Conjunctiva/sclera: Conjunctivae normal.     Pupils: Pupils are equal, round, and reactive to light.  Neck:     Musculoskeletal: Normal range of motion and neck supple.  Cardiovascular:     Rate and Rhythm: Normal rate and regular rhythm.     Pulses: Normal pulses.     Heart sounds: Normal heart sounds. No murmur. No friction rub. No gallop.   Pulmonary:     Effort: Pulmonary effort is normal. No respiratory distress.     Breath sounds: Normal breath sounds. No stridor. No wheezing, rhonchi or rales.  Chest:     Chest wall: No tenderness.  Musculoskeletal: Normal range of motion.  Skin:    General: Skin is warm and dry.     Capillary Refill: Capillary refill takes less than 2 seconds.     Coloration: Skin is not jaundiced or pale.     Findings: Rash (maculopapular rash on  both sides of belly, under pannus) present. No bruising, erythema or lesion.  Neurological:     General: No focal deficit present.     Mental Status: He is alert and oriented to person, place, and time. Mental status is at baseline.  Psychiatric:        Mood and Affect: Mood normal.        Behavior: Behavior normal.        Thought Content: Thought content normal.        Judgment: Judgment normal.     Results for orders placed or performed in visit on 10/25/18  Comprehensive metabolic panel  Result Value Ref Range   Glucose 101 (H) 65 - 99 mg/dL   BUN 17 8 - 27 mg/dL   Creatinine, Ser 0.92 0.76 - 1.27 mg/dL   GFR calc non Af Amer 89 >59 mL/min/1.73   GFR calc Af Amer 103 >59 mL/min/1.73   BUN/Creatinine Ratio 18 10 - 24   Sodium 143 134 - 144 mmol/L   Potassium 4.4 3.5 - 5.2 mmol/L   Chloride 106 96 - 106 mmol/L   CO2 21 20 - 29 mmol/L    Calcium 9.6 8.6 - 10.2 mg/dL   Total Protein 7.0 6.0 - 8.5 g/dL   Albumin 4.7 3.8 - 4.8 g/dL   Globulin, Total 2.3 1.5 - 4.5 g/dL   Albumin/Globulin Ratio 2.0 1.2 - 2.2   Bilirubin Total 0.4 0.0 - 1.2 mg/dL   Alkaline Phosphatase 61 39 - 117 IU/L   AST 49 (H) 0 - 40 IU/L   ALT 64 (H) 0 - 44 IU/L  Lipid Panel w/o Chol/HDL Ratio  Result Value Ref Range   Cholesterol, Total 187 100 - 199 mg/dL   Triglycerides 108 0 - 149 mg/dL   HDL 41 >39 mg/dL   VLDL Cholesterol Cal 22 5 - 40 mg/dL   LDL Calculated 124 (H) 0 - 99 mg/dL  CBC with Differential/Platelet  Result Value Ref Range   WBC 5.7 3.4 - 10.8 x10E3/uL   RBC 5.23 4.14 - 5.80 x10E6/uL   Hemoglobin 15.8 13.0 - 17.7 g/dL   Hematocrit 47.8 37.5 - 51.0 %   MCV 91 79 - 97 fL   MCH 30.2 26.6 - 33.0 pg   MCHC 33.1 31.5 - 35.7 g/dL   RDW 13.6 11.6 - 15.4 %   Platelets 196 150 - 450 x10E3/uL   Neutrophils 46 Not Estab. %   Lymphs 37 Not Estab. %   Monocytes 12 Not Estab. %   Eos 4 Not Estab. %   Basos 1 Not Estab. %   Neutrophils Absolute 2.7 1.4 - 7.0 x10E3/uL   Lymphocytes Absolute 2.1 0.7 - 3.1 x10E3/uL   Monocytes Absolute 0.7 0.1 - 0.9 x10E3/uL   EOS (ABSOLUTE) 0.2 0.0 - 0.4 x10E3/uL   Basophils Absolute 0.0 0.0 - 0.2 x10E3/uL   Immature Granulocytes 0 Not Estab. %   Immature Grans (Abs) 0.0 0.0 - 0.1 x10E3/uL      Assessment & Plan:   Problem List Items Addressed This Visit    None    Visit Diagnoses    Contact dermatitis, unspecified contact dermatitis type, unspecified trigger    -  Primary   Will treat with triamcinalone ointment and kenalog shot. Call if not getting better or getting worse.    Relevant Medications   triamcinolone acetonide (KENALOG-40) injection 40 mg (Completed)       Follow up plan: Return for September Physical.

## 2019-03-09 ENCOUNTER — Telehealth: Payer: Self-pay | Admitting: Family Medicine
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
## 2019-03-12 MED ORDER — PREDNISONE 50 MG PO TABS: 50.0000 mg | ORAL_TABLET | Freq: Every day | ORAL | 0 refills | Status: DC

## 2019-03-19 ENCOUNTER — Ambulatory Visit
Admission: EM | Admit: 2019-03-19 | Discharge: 2019-03-19 | Disposition: A | Discharge status: Home / Self Care | Payer: PPO | Attending: Internal Medicine | Admitting: Internal Medicine

## 2019-03-19 ENCOUNTER — Other Ambulatory Visit: Payer: Self-pay

## 2019-03-19 ENCOUNTER — Encounter: Payer: Self-pay | Admitting: Emergency Medicine

## 2019-03-19 DIAGNOSIS — L237 Allergic contact dermatitis due to plants, except food: Secondary | ICD-10-CM

## 2019-03-19 MED ORDER — METHYLPREDNISOLONE SODIUM SUCC 40 MG IJ SOLR
40.0000 mg | Freq: Once | INTRAMUSCULAR | Status: AC
  Administered 2019-03-19: 18:00:00 40 mg via INTRAMUSCULAR

## 2019-03-19 NOTE — ED Provider Notes (Signed)
[facility]    CSN: [id number] Arrival date & time: 03/19/19  1600      History   Chief Complaint Chief Complaint  Patient presents with  . Rash    APPT    HPI Derrick Lutes. is a 63 y.o. male. who presents with rash on his forearms  x 2 days when he was trying to get his dog out from the woods. Does not know if he was around poison IV. Rash itches and burns. He mowed his yard today and the rash is more itching and feels worse and now is getting on her L abd. Had contact dermatitis 2 weeks ago and was on prednisone.     Past Medical History:  Diagnosis Date  . Allergy   . Cough    nonproductive no fever saw at dr 05-26-18  . GERD (gastroesophageal reflux disease)   . Hyperlipidemia   . Hypertension   . Meningitis due to viruses 1968    Patient Active Problem List   Diagnosis Date Noted  . Shingles 02/19/2019  . Poison ivy dermatitis 02/02/2019  . URI (upper respiratory infection) 02/02/2019  . Gastric mass   . Genital herpes 01/23/2016  . Skin lesion of left arm 08/16/2015  . Nail abnormality 08/04/2015  . Short-term memory loss 08/04/2015  . Dysphagia 05/12/2015  . Vitamin D deficiency 05/12/2015  . Blood in the stool 05/12/2015  . GERD (gastroesophageal reflux disease) 01/29/2015  . OA (osteoarthritis) of knee 01/29/2015  . Fatty liver 01/28/2015  . Left ventricular hypertrophy 01/28/2015  . Morbid obesity ([place]) 01/28/2015  . Rhinitis, allergic 01/28/2015  . Elevated transaminase level 01/28/2015  . Hyperlipidemia 01/28/2015  . Benign hypertension 01/28/2015  . Benign prostatic hyperplasia with lower urinary tract symptoms 01/28/2015    Past Surgical History:  Procedure Laterality Date  . COLONOSCOPY WITH PROPOFOL N/A 05/12/2018   Procedure: COLONOSCOPY WITH PROPOFOL;  Surgeon: Jonathon Bellows, MD;  Location: [facility] ENDOSCOPY;  Service: Gastroenterology;  Laterality: N/A;  . ESOPHAGOGASTRODUODENOSCOPY (EGD) WITH PROPOFOL N/A 05/12/2018   Procedure: ESOPHAGOGASTRODUODENOSCOPY (EGD) WITH PROPOFOL;  Surgeon: Jonathon Bellows, MD;  Location: [facility] ENDOSCOPY;  Service: Gastroenterology;  Laterality: N/A;  . ESOPHAGOGASTRODUODENOSCOPY (EGD) WITH PROPOFOL N/A 06/01/2018   Procedure: ESOPHAGOGASTRODUODENOSCOPY (EGD) WITH PROPOFOL;  Surgeon: Milus Banister, MD;  Location: [facility] ENDOSCOPY;  Service: Endoscopy;  Laterality: N/A;  . EUS N/A 06/01/2018   Procedure: UPPER ENDOSCOPIC ULTRASOUND (EUS) RADIAL;  Surgeon: Milus Banister, MD;  Location: [facility] ENDOSCOPY;  Service: Endoscopy;  Laterality: N/A;  . JOINT REPLACEMENT Bilateral   . NASAL SINUS SURGERY    . TOTAL KNEE ARTHROPLASTY         Home Medications    Prior to Admission medications   Medication Sig Start Date End Date Taking? Authorizing Provider  acyclovir (ZOVIRAX) 400 MG tablet Take 1 tablet (400 mg total) by mouth 2 (two) times daily. 10/25/18  Yes Johnson, Megan P, DO  albuterol (PROVENTIL HFA;VENTOLIN HFA) 108 (90 Base) MCG/ACT inhaler Inhale 1-2 puffs into the lungs every 6 (six) hours as needed for wheezing or shortness of breath.   Yes [provider]  Ascorbic Acid (VITAMIN C) 1000 MG tablet Take 1,000 mg by mouth at bedtime.    Yes [provider]  aspirin EC 81 MG tablet Take 81 mg by mouth daily.   Yes [provider]  cholecalciferol (VITAMIN D) 1000 UNITS tablet Take 1,000 Units by mouth daily.    Yes [provider]  famotidine (  PEPCID) 20 MG tablet Take 20 mg by mouth 2 (two) times daily.   Yes [provider]  finasteride (PROSCAR) 5 MG tablet Take 1 tablet (5 mg total) by mouth daily. 10/25/18  Yes Johnson, Megan P, DO  fluticasone (FLONASE) 50 MCG/ACT nasal spray Place 1 spray into both nostrils 2 (two) times daily. 03/05/19  Yes Johnson, Megan P, DO  hydrOXYzine (ATARAX/VISTARIL) 50 MG tablet Take 1-2 tablets (50-100 mg total) by mouth 3 (three) times daily as needed. 11/15/18  Yes Johnson, Megan P, DO  lisinopril  (PRINIVIL,ZESTRIL) 20 MG tablet Take 1 tablet (20 mg total) by mouth daily. 10/25/18  Yes Johnson, Megan P, DO  loratadine (CLARITIN) 10 MG tablet Take 1 tablet (10 mg total) by mouth daily. 12/01/18  Yes Johnson, Megan P, DO  montelukast (SINGULAIR) 10 MG tablet Take 1 tablet (10 mg total) by mouth at bedtime. 10/25/18  Yes Johnson, Megan P, DO  Multiple Vitamins-Minerals (MULTIVITAMIN WITH MINERALS) tablet Take 1 tablet by mouth daily.   Yes [provider]  omeprazole (PRILOSEC) 40 MG capsule Take 1 capsule (40 mg total) by mouth daily. 10/25/18  Yes Johnson, Megan P, DO  tamsulosin (FLOMAX) 0.4 MG CAPS capsule Take 1 capsule (0.4 mg total) by mouth 2 (two) times daily. 10/25/18  Yes Johnson, Megan P, DO  amoxicillin (AMOXIL) 500 MG capsule  03/05/19   [provider]  betamethasone, augmented, (DIPROLENE) 0.05 % lotion Apply topically 2 (two) times daily. Apply to areas of rash 02/02/19   Cannady, Henrine Screws T, NP  nystatin cream (MYCOSTATIN) APPLY CREAM TOPICALLY TWICE DAILY Patient taking differently: Apply 1 application topically daily as needed (for skin irritation/rash.).  05/08/18   Park Liter P, DO  predniSONE (DELTASONE) 50 MG tablet Take 1 tablet (50 mg total) by mouth daily with breakfast. 03/12/19   Park Liter P, DO  triamcinolone ointment (KENALOG) 0.5 % Apply 1 application topically 2 (two) times daily. 03/06/19   Valerie Roys, DO    Family History Family History  Problem Relation Age of Onset  . Breast cancer Mother   . Cancer Father        unsure, passed away before it was confirmed  . Pancreatic cancer Daughter   . Diabetes Neg Hx   . Heart disease Neg Hx   . Hypertension Neg Hx   . Stroke Neg Hx   . COPD Neg Hx   . Colon cancer Neg Hx     Social History Social History   Tobacco Use  . Smoking status: Never Smoker  . Smokeless tobacco: Never Used  Substance Use Topics  . Alcohol use: Not Currently    Alcohol/week: 0.0 standard drinks     Comment: occasionally, twice a year   . Drug use: No     Allergies   Patient has no known allergies.   Review of Systems Review of Systems  Musculoskeletal: Negative for arthralgias.  Skin: Positive for rash. Negative for wound.       Has itching skin  Neurological: Negative for numbness.     Physical Exam Triage Vital Signs ED Triage Vitals  Enc Vitals Group     BP 03/19/19 1637 (!) 146/92     Pulse Rate 03/19/19 1637 79     Resp 03/19/19 1637 18     Temp 03/19/19 1637 98.1 F (36.7 C)     Temp Source 03/19/19 1637 Oral     SpO2 03/19/19 1637 97 %     Weight 03/19/19  1633 245 lb (111.1 kg)     Height 03/19/19 1633 5\' 7"  (1.702 m)     Head Circumference --      Peak Flow --      Pain Score 03/19/19 1633 0     Pain Loc --      Pain Edu? --      Excl. in [place]? --    No data found.  Updated Vital Signs BP (!) 146/92 (BP Location: Left Arm)   Pulse 79   Temp 98.1 F (36.7 C) (Oral)   Resp 18   Ht 5\' 7"  (1.702 m)   Wt 245 lb (111.1 kg)   SpO2 97%   BMI 38.37 kg/m   Visual Acuity Right Eye Distance:   Left Eye Distance:   Bilateral Distance:    Right Eye Near:   Left Eye Near:    Bilateral Near:     Physical Exam Vitals signs and nursing note reviewed.  Constitutional:      General: He is not in acute distress.    Appearance: Normal appearance. He is not ill-appearing, toxic-appearing or diaphoretic.  HENT:     Head: Normocephalic.     Right Ear: External ear normal.     Left Ear: External ear normal.     Nose: Nose normal.  Eyes:     General: No scleral icterus.    Conjunctiva/sclera: Conjunctivae normal.  Neck:     Musculoskeletal: Neck supple.  Pulmonary:     Effort: Pulmonary effort is normal.  Musculoskeletal: Normal range of motion.  Skin:    General: Skin is warm.     Findings: Rash present.     Comments: Has eyrthematous maculopapular rash on both forearm which does not look typical of poison IV or [place]. No oozing. Few spots present on  L abdomen. None of face.   Neurological:     Mental Status: He is alert and oriented to person, place, and time.     Gait: Gait normal.  Psychiatric:        Mood and Affect: Mood normal.        Behavior: Behavior normal.      UC Treatments / Results  Labs (all labs ordered are listed, but only abnormal results are displayed) Labs Reviewed - No data to display  EKG   Radiology No results found.  Procedures none  Medications Ordered in UC Medications - No data to display  Initial Impression / Assessment and Plan / UC Course  I have reviewed the triage vital signs and the nursing notes. Advised to wash his skin with TECNU now and any future exposures to plants.  He was given Kenalog 40 mg IM. Explained he cant have steroids but every 3 weeks, so needs to be more carefull with plant exposure and use the TECNU wash.   Final Clinical Impressions(s) / UC Diagnoses   Final diagnoses:  None   Discharge Instructions   None    ED Prescriptions    None     Controlled Substance Prescriptions [place] Controlled Substance Registry consulted?    Shelby Mattocks, [place] 03/19/19 1849

## 2019-03-19 NOTE — ED Triage Notes (Signed)
Pt c/o rash on bilateral forearms and stomach. He was getting his dog out of some brush. Started 2 days ago.

## 2019-03-19 NOTE — Discharge Instructions (Signed)
Get a cleanser called TECNU and wash the oils on the rash. You may apply hydrocortisone 1% on the skin for itching or benadryl cream.

## 2019-04-03 DIAGNOSIS — N35919 Unspecified urethral stricture, male, unspecified site: Secondary | ICD-10-CM | POA: Diagnosis not present

## 2019-04-03 DIAGNOSIS — N401 Enlarged prostate with lower urinary tract symptoms: Secondary | ICD-10-CM | POA: Diagnosis not present

## 2019-04-03 DIAGNOSIS — N319 Neuromuscular dysfunction of bladder, unspecified: Secondary | ICD-10-CM | POA: Diagnosis not present

## 2019-04-03 DIAGNOSIS — R339 Retention of urine, unspecified: Secondary | ICD-10-CM | POA: Diagnosis not present

## 2019-04-19 ENCOUNTER — Telehealth: Payer: Self-pay

## 2019-04-19 NOTE — Telephone Encounter (Signed)
Called to schedule medicare annual wellness visit with NHA- Tiffany Hill,LPN at [facility]. Please schedule anytime after 04/22/2019. Patient can have on phone or in office this year.  Any questions please have patient call Tiffany Hill,LPN at [phone].

## 2019-05-01 ENCOUNTER — Encounter: Payer: Self-pay | Admitting: Family Medicine

## 2019-05-02 ENCOUNTER — Encounter: Payer: Self-pay | Admitting: Family Medicine

## 2019-05-24 ENCOUNTER — Encounter: Payer: Self-pay | Admitting: Family Medicine

## 2019-05-25 ENCOUNTER — Encounter: Payer: Self-pay | Admitting: Family Medicine

## 2019-05-25 ENCOUNTER — Ambulatory Visit (INDEPENDENT_AMBULATORY_CARE_PROVIDER_SITE_OTHER): Payer: PPO | Admitting: Family Medicine

## 2019-05-25 ENCOUNTER — Other Ambulatory Visit: Payer: Self-pay

## 2019-05-25 VITALS — BP 157/111 | HR 69 | Temp 98.4°F

## 2019-05-25 DIAGNOSIS — J069 Acute upper respiratory infection, unspecified: Secondary | ICD-10-CM | POA: Diagnosis not present

## 2019-05-25 MED ORDER — CYCLOBENZAPRINE HCL 5 MG PO TABS: 5.0000 mg | ORAL_TABLET | Freq: Every evening | ORAL | 0 refills | Status: DC | PRN

## 2019-05-25 MED ORDER — AZITHROMYCIN 250 MG PO TABS: ORAL_TABLET | ORAL | 0 refills | Status: DC

## 2019-05-25 NOTE — Progress Notes (Signed)
BP (!) 157/111   Pulse 69   Temp 98.4 F (36.9 C) (Oral)   SpO2 97%    Subjective:    Patient ID: Derrick Ball., male    DOB: Jun 08, 1956, 63 y.o.   MRN: [id number]  HPI: Derrick Sizemore. is a 62 y.o. male  Chief Complaint  Patient presents with  . Pain    pt states he has been having pain in his shoulders and neck for the past 2 weeks. States he has also been congested as well   Chest congestion and tightness, headache, nasal congestion, and shoulder soreness/neck soreness for the past 2 weeks. Has been taking some sudafed with minimal relief, tylenol, aleve, aspirin. Does sinus rinses BID as well as flonase, singulair and claritin regimen for his known allergic rhinitis. Denies fevers, chills, CP, SOB, sick contacts, N/V/D.   Relevant past medical, surgical, family and social history reviewed and updated as indicated. Interim medical history since our last visit reviewed. Allergies and medications reviewed and updated.  Review of Systems  Per HPI unless specifically indicated above     Objective:    BP (!) 157/111   Pulse 69   Temp 98.4 F (36.9 C) (Oral)   SpO2 97%   Wt Readings from Last 3 Encounters:  03/19/19 245 lb (111.1 kg)  03/06/19 253 lb (114.8 kg)  02/02/19 254 lb (115.2 kg)    Physical Exam Vitals signs and nursing note reviewed.  Constitutional:      Appearance: Normal appearance.  HENT:     Head: Atraumatic.     Right Ear: Tympanic membrane normal.     Left Ear: Tympanic membrane normal.     Nose: Congestion present.     Mouth/Throat:     Mouth: Mucous membranes are moist.     Pharynx: Posterior oropharyngeal erythema present.  Eyes:     Extraocular Movements: Extraocular movements intact.     Conjunctiva/sclera: Conjunctivae normal.  Neck:     Musculoskeletal: Normal range of motion and neck supple.  Cardiovascular:     Rate and Rhythm: Normal rate and regular rhythm.  Pulmonary:     Effort: Pulmonary effort is normal. No  respiratory distress.     Breath sounds: Normal breath sounds. No wheezing.  Abdominal:     General: Bowel sounds are normal.     Palpations: Abdomen is soft.     Tenderness: There is no abdominal tenderness. There is no guarding.  Musculoskeletal: Normal range of motion.        General: Tenderness (mild ttp b/l trapezius muscles) present.  Skin:    General: Skin is warm and dry.  Neurological:     General: No focal deficit present.     Mental Status: He is oriented to person, place, and time.  Psychiatric:        Mood and Affect: Mood normal.        Thought Content: Thought content normal.        Judgment: Judgment normal.     Results for orders placed or performed in visit on 10/25/18  Comprehensive metabolic panel  Result Value Ref Range   Glucose 101 (H) 65 - 99 mg/dL   BUN 17 8 - 27 mg/dL   Creatinine, Ser 0.92 0.76 - 1.27 mg/dL   GFR calc non Af Amer 89 >59 mL/min/1.73   GFR calc Af Amer 103 >59 mL/min/1.73   BUN/Creatinine Ratio 18 10 - 24   Sodium 143 134 - 144 mmol/L  Potassium 4.4 3.5 - 5.2 mmol/L   Chloride 106 96 - 106 mmol/L   CO2 21 20 - 29 mmol/L   Calcium 9.6 8.6 - 10.2 mg/dL   Total Protein 7.0 6.0 - 8.5 g/dL   Albumin 4.7 3.8 - 4.8 g/dL   Globulin, Total 2.3 1.5 - 4.5 g/dL   Albumin/Globulin Ratio 2.0 1.2 - 2.2   Bilirubin Total 0.4 0.0 - 1.2 mg/dL   Alkaline Phosphatase 61 39 - 117 IU/L   AST 49 (H) 0 - 40 IU/L   ALT 64 (H) 0 - 44 IU/L  Lipid Panel w/o Chol/HDL Ratio  Result Value Ref Range   Cholesterol, Total 187 100 - 199 mg/dL   Triglycerides 108 0 - 149 mg/dL   HDL 41 >39 mg/dL   VLDL Cholesterol Cal 22 5 - 40 mg/dL   LDL Calculated 124 (H) 0 - 99 mg/dL  CBC with Differential/Platelet  Result Value Ref Range   WBC 5.7 3.4 - 10.8 x10E3/uL   RBC 5.23 4.14 - 5.80 x10E6/uL   Hemoglobin 15.8 13.0 - 17.7 g/dL   Hematocrit 47.8 37.5 - 51.0 %   MCV 91 79 - 97 fL   MCH 30.2 26.6 - 33.0 pg   MCHC 33.1 31.5 - 35.7 g/dL   RDW 13.6 11.6 - 15.4 %    Platelets 196 150 - 450 x10E3/uL   Neutrophils 46 Not Estab. %   Lymphs 37 Not Estab. %   Monocytes 12 Not Estab. %   Eos 4 Not Estab. %   Basos 1 Not Estab. %   Neutrophils Absolute 2.7 1.4 - 7.0 x10E3/uL   Lymphocytes Absolute 2.1 0.7 - 3.1 x10E3/uL   Monocytes Absolute 0.7 0.1 - 0.9 x10E3/uL   EOS (ABSOLUTE) 0.2 0.0 - 0.4 x10E3/uL   Basophils Absolute 0.0 0.0 - 0.2 x10E3/uL   Immature Granulocytes 0 Not Estab. %   Immature Grans (Abs) 0.0 0.0 - 0.1 x10E3/uL      Assessment & Plan:   Problem List Items Addressed This Visit      Respiratory   URI (upper respiratory infection) - Primary   Relevant Medications   nystatin cream (MYCOSTATIN)   azithromycin (ZITHROMAX) 250 MG tablet       Follow up plan: Return if symptoms worsen or fail to improve.

## 2019-05-28 ENCOUNTER — Telehealth: Payer: Self-pay

## 2019-05-28 NOTE — Telephone Encounter (Signed)
Called patient, went straight to VM. Will attempt again at later time.

## 2019-05-28 NOTE — Telephone Encounter (Signed)
-----   Message from Redgie Grayer sent at 05/25/2019 10:54 AM EDT ----- Regarding: AWV Appt Please call this patient to schedule his AWV.  He states he was working with someone but has not received a call back.  Thanks,   Call phone#:  [phone]

## 2019-06-03 ENCOUNTER — Other Ambulatory Visit: Payer: Self-pay | Admitting: Family Medicine

## 2019-06-05 ENCOUNTER — Ambulatory Visit: Payer: PPO

## 2019-06-06 ENCOUNTER — Ambulatory Visit (INDEPENDENT_AMBULATORY_CARE_PROVIDER_SITE_OTHER): Payer: PPO

## 2019-06-06 VITALS — BP 98/71 | HR 66 | Temp 96.9°F | Ht 67.0 in | Wt 256.0 lb

## 2019-06-06 DIAGNOSIS — Z0000 Encounter for general adult medical examination without abnormal findings: Secondary | ICD-10-CM | POA: Diagnosis not present

## 2019-06-06 NOTE — Progress Notes (Signed)
Subjective:   Derrick Sandoval. is a 63 y.o. male who presents for Medicare Annual/Subsequent preventive examination.  This visit is being conducted via phone call  - after an attmept to do on video chat - due to the COVID-19 pandemic. This patient has given me verbal consent via phone to conduct this visit, patient states they are participating from their home address. Some vital signs may be absent or patient reported.   Patient identification: identified by name, DOB, and current address.    Review of Systems:   Cardiac Risk Factors include: advanced age (>32men, >25 women);hypertension;male gender;dyslipidemia     Objective:    Vitals: BP 98/71 Comment: pt reported  Pulse 66 Comment: pt reported  Temp (!) 96.9 F (36.1 C) (Temporal) Comment: pt reported  Ht 5\' 7"  (1.702 m)   Wt 256 lb (116.1 kg) Comment: pt reported  BMI 40.10 kg/m   Body mass index is 40.1 kg/m.  Advanced Directives 06/06/2019 06/01/2018 05/29/2018 05/12/2018 04/21/2018 10/08/2017 02/22/2017  Does Patient Have a Medical Advance Directive? No Yes No No No No No  Would patient like information on creating a medical advance directive? - - No - Patient declined - Yes (MAU/Ambulatory/Procedural Areas - Information given) No - Patient declined No - Patient declined    Tobacco Social History   Tobacco Use  Smoking Status Never Smoker  Smokeless Tobacco Never Used     Counseling given: Not Answered   Clinical Intake:  Pre-visit preparation completed: Yes  Pain : No/denies pain     Nutritional Status: BMI > 30  Obese Nutritional Risks: None Diabetes: No  How often do you need to have someone help you when you read instructions, pamphlets, or other written materials from your doctor or pharmacy?: 1 - Never  Interpreter Needed?: No  Information entered by ::  ,LPN  Past Medical History:  Diagnosis Date  . Allergy   . Arthritis   . Cough    nonproductive no fever saw at dr 05-26-18   . GERD (gastroesophageal reflux disease)   . Hyperlipidemia   . Hypertension   . Meningitis due to viruses 1968   Past Surgical History:  Procedure Laterality Date  . COLONOSCOPY WITH PROPOFOL N/A 05/12/2018   Procedure: COLONOSCOPY WITH PROPOFOL;  Surgeon: Jonathon Bellows, MD;  Location[facility] ENDOSCOPY;  Service: Gastroenterology;  Laterality: N/A;  . ESOPHAGOGASTRODUODENOSCOPY (EGD) WITH PROPOFOL N/A 05/12/2018   Procedure: ESOPHAGOGASTRODUODENOSCOPY (EGD) WITH PROPOFOL;  Surgeon: Jonathon Bellows, MD;  Location: [facility] ENDOSCOPY;  Service: Gastroenterology;  Laterality: N/A;  . ESOPHAGOGASTRODUODENOSCOPY (EGD) WITH PROPOFOL N/A 06/01/2018   Procedure: ESOPHAGOGASTRODUODENOSCOPY (EGD) WITH PROPOFOL;  Surgeon: Milus Banister, MD;  Location: [facility] ENDOSCOPY;  Service: Endoscopy;  Laterality: N/A;  . EUS N/A 06/01/2018   Procedure: UPPER ENDOSCOPIC ULTRASOUND (EUS) RADIAL;  Surgeon: Milus Banister, MD;  Location: [facility] ENDOSCOPY;  Service: Endoscopy;  Laterality: N/A;  . JOINT REPLACEMENT Bilateral   . NASAL SINUS SURGERY    . TOTAL KNEE ARTHROPLASTY     Family History  Problem Relation Age of Onset  . Breast cancer Mother   . Cancer Father        unsure, passed away before it was confirmed  . Pancreatic cancer Daughter   . Diabetes Neg Hx   . Heart disease Neg Hx   . Hypertension Neg Hx   . Stroke Neg Hx   . COPD Neg Hx   . Colon cancer Neg Hx    Social History  Socioeconomic History  . Marital status: Married    Spouse name: Not on file  . Number of children: Not on file  . Years of education: 10th grade, GED  . Highest education level: GED or equivalent  Occupational History  . Occupation: disabled   Social Needs  . Financial resource strain: Not hard at all  . Food insecurity    Worry: Never true    Inability: Never true  . Transportation needs    Medical: No    Non-medical: No  Tobacco Use  . Smoking status: Never Smoker  . Smokeless tobacco: Never Used  Substance and  Sexual Activity  . Alcohol use: Not Currently    Alcohol/week: 0.0 standard drinks    Comment: occasionally, twice a year   . Drug use: No  . Sexual activity: Not Currently  Lifestyle  . Physical activity    Days per week: 0 days    Minutes per session: 0 min  . Stress: Not at all  Relationships  . Social connections    Talks on phone: More than three times a week    Gets together: More than three times a week    Attends religious service: Never    Active member of club or organization: No    Attends meetings of clubs or organizations: Never    Relationship status: Married  Other Topics Concern  . Not on file  Social History Narrative   [employer] 3x a week     Outpatient Encounter Medications as of 06/06/2019  Medication Sig  . acyclovir (ZOVIRAX) 400 MG tablet Take 1 tablet by mouth twice daily  . albuterol (PROVENTIL HFA;VENTOLIN HFA) 108 (90 Base) MCG/ACT inhaler Inhale 1-2 puffs into the lungs every 6 (six) hours as needed for wheezing or shortness of breath.  . Ascorbic Acid (VITAMIN C) 1000 MG tablet Take 1,000 mg by mouth at bedtime.   Marland Kitchen aspirin EC 81 MG tablet Take 81 mg by mouth daily.  Marland Kitchen azithromycin (ZITHROMAX) 250 MG tablet Take 2 tabs day one, then 1 tab daily until complete  . cholecalciferol (VITAMIN D) 1000 UNITS tablet Take 1,000 Units by mouth daily.   . cyclobenzaprine (FLEXERIL) 5 MG tablet Take 1 tablet (5 mg total) by mouth at bedtime as needed for muscle spasms.  . famotidine (PEPCID) 20 MG tablet Take 20 mg by mouth 2 (two) times daily.  . finasteride (PROSCAR) 5 MG tablet Take 1 tablet (5 mg total) by mouth daily.  . fluticasone (FLONASE) 50 MCG/ACT nasal spray Place 1 spray into both nostrils 2 (two) times daily.  . hydrOXYzine (ATARAX/VISTARIL) 50 MG tablet Take 1-2 tablets (50-100 mg total) by mouth 3 (three) times daily as needed.  Marland Kitchen lisinopril (PRINIVIL,ZESTRIL) 20 MG tablet Take 1 tablet (20 mg total) by mouth daily.  Marland Kitchen loratadine (CLARITIN) 10 MG  tablet Take 1 tablet (10 mg total) by mouth daily.  . montelukast (SINGULAIR) 10 MG tablet Take 1 tablet (10 mg total) by mouth at bedtime.  . Multiple Vitamins-Minerals (MULTIVITAMIN WITH MINERALS) tablet Take 1 tablet by mouth daily.  Marland Kitchen nystatin cream (MYCOSTATIN) Apply 1 application topically 2 (two) times daily.  Marland Kitchen omeprazole (PRILOSEC) 40 MG capsule Take 1 capsule (40 mg total) by mouth daily.  . tamsulosin (FLOMAX) 0.4 MG CAPS capsule Take 1 capsule (0.4 mg total) by mouth 2 (two) times daily.  Marland Kitchen triamcinolone ointment (KENALOG) 0.1 % Apply 1 application topically 2 (two) times daily.   No facility-administered encounter medications on  file as of 06/06/2019.     Activities of Daily Living In your present state of health, do you have any difficulty performing the following activities: 06/06/2019 10/25/2018  Hearing? N N  Comment no hearing aids -  Vision? N N  Comment glasses,going to to go [facility] -  Difficulty concentrating or making decisions? N Y  Walking or climbing stairs? N N  Dressing or bathing? N N  Doing errands, shopping? N N  Preparing Food and eating ? N -  Using the Toilet? N -  In the past six months, have you accidently leaked urine? N -  Do you have problems with loss of bowel control? N -  Managing your Medications? N -  Managing your Finances? N -  Housekeeping or managing your Housekeeping? N -  Some recent data might be hidden    Patient Care Team: Valerie Roys, DO as PCP - General (Family Medicine) Delgaizo, Lavon Paganini, MD as Referring Physician (Orthopedic Surgery) Viprakasit, Marlowe Shores, MD as Referring Physician (Urology)   Assessment:   This is a routine wellness examination for Maddax.  Exercise Activities and Dietary recommendations Current Exercise Habits: The patient does not participate in regular exercise at present(stays active at home), Exercise limited by: None identified  Goals    . DIET - INCREASE WATER INTAKE      Recommend drinking at least 6-8 glasses of water a day        Fall Risk: Fall Risk  06/06/2019 02/19/2019 10/25/2018 04/21/2018 02/14/2018  Falls in the past year? 0 0 0 No No  Number falls in past yr: 0 0 0 - -  Injury with Fall? 0 - 0 - -  Comment - - - - -  Follow up - Falls evaluation completed - - -    FALL RISK PREVENTION PERTAINING TO THE HOME:  Any stairs in or around the home? No  If so, are there any without handrails? No   Home free of loose throw rugs in walkways, pet beds, electrical cords, etc? Yes  Adequate lighting in your home to reduce risk of falls? Yes   ASSISTIVE DEVICES UTILIZED TO PREVENT FALLS:  Life alert? No  Use of a cane, walker or w/c? No  Grab bars in the bathroom? No  Shower chair or bench in shower? No  Elevated toilet seat or a handicapped toilet? No   TIMED UP AND GO:  Unable to perform   Depression Screen PHQ 2/9 Scores 06/06/2019 11/24/2018 10/25/2018 04/21/2018  PHQ - 2 Score 0 0 0 0  PHQ- 9 Score - 1 9 -    Cognitive Function     6CIT Screen 04/21/2018 02/22/2017  What Year? 0 points 0 points  What month? 0 points 0 points  What time? 0 points 0 points  Count back from 20 0 points 0 points  Months in reverse 0 points 0 points  Repeat phrase 0 points 0 points  Total Score 0 0    Immunization History  Administered Date(s) Administered  . Influenza,inj,Quad PF,6+ Mos 05/12/2015, 04/29/2016, 05/31/2017, 04/21/2018, 05/04/2019  . Influenza-Unspecified 05/16/2014, 07/28/2015, 05/04/2019  . Td 01/23/2016  . Tdap 01/05/2012, 10/08/2017  . Zoster Recombinat (Shingrix) 05/04/2019    Qualifies for Shingles Vaccine? Shingrix, first dose completed   Tdap: up to date   Flu Vaccine: up to date  Pneumococcal Vaccine: up to date   Screening Tests Health Maintenance  Topic Date Due  . TETANUS/TDAP  10/09/2027  . COLONOSCOPY  05/12/2028  . INFLUENZA VACCINE  Completed  . HIV Screening  Completed  . Hepatitis C Screening  Addressed    Cancer Screenings:  Colorectal Screening: Completed 04/2018.  Repeat every 10 years  Lung Cancer Screening: (Low Dose CT Chest recommended if Age 59-80 years, 30 pack-year currently smoking OR have quit w/in 15years.) does not qualify.    Additional Screening:  Hepatitis C Screening: does qualify; Completed 06/11/2016  Vision Screening: Recommended annual ophthalmology exams for early detection of glaucoma and other disorders of the eye. Is the patient up to date with their annual eye exam?  no   Dental Screening: Recommended annual dental exams for proper oral hygiene  Community Resource Referral:  CRR required this visit?  No        Plan:  I have personally reviewed and addressed the Medicare Annual Wellness questionnaire and have noted the following in the patient's chart:  A. Medical and social history B. Use of alcohol, tobacco or illicit drugs  C. Current medications and supplements D. Functional ability and status E.  Nutritional status F.  Physical activity G. Advance directives H. List of other physicians I.  Hospitalizations, surgeries, and ER visits in previous 12 months J.  [place] such as hearing and vision if needed, cognitive and depression L. Referrals and appointments   In addition, I have reviewed and discussed with patient certain preventive protocols, quality metrics, and best practice recommendations. A written personalized care plan for preventive services as well as general preventive health recommendations were provided to patient.   Signed,   Bevelyn Ngo, LPN  [id number] Nurse Health Advisor   Nurse Notes: patient needs new BP cuff.

## 2019-06-06 NOTE — Patient Instructions (Signed)
Mr. Derrick Sandoval , Thank you for taking time to come for your Medicare Wellness Visit. I appreciate your ongoing commitment to your health goals. Please review the following plan we discussed and let me know if I can assist you in the future.   Screening recommendations/referrals: Colonoscopy: up to date Recommended yearly ophthalmology/optometry visit for glaucoma screening and checkup Recommended yearly dental visit for hygiene and checkup  Vaccinations: Influenza vaccine: up to date Pneumococcal vaccine: not indicated  Tdap vaccine: up to date  Shingles vaccine: up to date    Advanced directives: please pick up a copy of this information next time you are in the office   Conditions/risks identified: will get prescription for new blood pressure cuff.   Next appointment: follow up in one year for your annual wellness visit   Preventive Care 40-64 Years, Male Preventive care refers to lifestyle choices and visits with your health care provider that can promote health and wellness. What does preventive care include?  A yearly physical exam. This is also called an annual well check.  Dental exams once or twice a year.  Routine eye exams. Ask your health care provider how often you should have your eyes checked.  Personal lifestyle choices, including:  Daily care of your teeth and gums.  Regular physical activity.  Eating a healthy diet.  Avoiding tobacco and drug use.  Limiting alcohol use.  Practicing safe sex.  Taking low-dose aspirin every day starting at age 47. What happens during an annual well check? The services and screenings done by your health care provider during your annual well check will depend on your age, overall health, lifestyle risk factors, and family history of disease. Counseling  Your health care provider may ask you questions about your:  Alcohol use.  Tobacco use.  Drug use.  Emotional well-being.  Home and relationship well-being.  Sexual  activity.  Eating habits.  Work and work Statistician. Screening  You may have the following tests or measurements:  Height, weight, and BMI.  Blood pressure.  Lipid and cholesterol levels. These may be checked every 5 years, or more frequently if you are over 5 years old.  Skin check.  Lung cancer screening. You may have this screening every year starting at age 74 if you have a 30-pack-year history of smoking and currently smoke or have quit within the past 15 years.  Fecal occult blood test (FOBT) of the stool. You may have this test every year starting at age 50.  Flexible sigmoidoscopy or colonoscopy. You may have a sigmoidoscopy every 5 years or a colonoscopy every 10 years starting at age 22.  Prostate cancer screening. Recommendations will vary depending on your family history and other risks.  Hepatitis C blood test.  Hepatitis B blood test.  Sexually transmitted disease (STD) testing.  Diabetes screening. This is done by checking your blood sugar (glucose) after you have not eaten for a while (fasting). You may have this done every 1-3 years. Discuss your test results, treatment options, and if necessary, the need for more tests with your health care provider. Vaccines  Your health care provider may recommend certain vaccines, such as:  Influenza vaccine. This is recommended every year.  Tetanus, diphtheria, and acellular pertussis (Tdap, Td) vaccine. You may need a Td booster every 10 years.  Zoster vaccine. You may need this after age 67.  Pneumococcal 13-valent conjugate (PCV13) vaccine. You may need this if you have certain conditions and have not been vaccinated.  Pneumococcal polysaccharide (  PPSV23) vaccine. You may need one or two doses if you smoke cigarettes or if you have certain conditions. Talk to your health care provider about which screenings and vaccines you need and how often you need them. This information is not intended to replace advice  given to you by your health care provider. Make sure you discuss any questions you have with your health care provider. Document Released: 08/29/2015 Document Revised: 04/21/2016 Document Reviewed: 06/03/2015 Elsevier Interactive Patient Education  2017 [place] Prevention in the Home Falls can cause injuries. They can happen to people of all ages. There are many things you can do to make your home safe and to help prevent falls. What can I do on the outside of my home?  Regularly fix the edges of walkways and driveways and fix any cracks.  Remove anything that might make you trip as you walk through a door, such as a raised step or threshold.  Trim any bushes or trees on the path to your home.  Use bright outdoor lighting.  Clear any walking paths of anything that might make someone trip, such as rocks or tools.  Regularly check to see if handrails are loose or broken. Make sure that both sides of any steps have handrails.  Any raised decks and porches should have guardrails on the edges.  Have any leaves, snow, or ice cleared regularly.  Use sand or salt on walking paths during winter.  Clean up any spills in your garage right away. This includes oil or grease spills. What can I do in the bathroom?  Use night lights.  Install grab bars by the toilet and in the tub and shower. Do not use towel bars as grab bars.  Use non-skid mats or decals in the tub or shower.  If you need to sit down in the shower, use a plastic, non-slip stool.  Keep the floor dry. Clean up any water that spills on the floor as soon as it happens.  Remove soap buildup in the tub or shower regularly.  Attach bath mats securely with double-sided non-slip rug tape.  Do not have throw rugs and other things on the floor that can make you trip. What can I do in the bedroom?  Use night lights.  Make sure that you have a light by your bed that is easy to reach.  Do not use any sheets or  blankets that are too big for your bed. They should not hang down onto the floor.  Have a firm chair that has side arms. You can use this for support while you get dressed.  Do not have throw rugs and other things on the floor that can make you trip. What can I do in the kitchen?  Clean up any spills right away.  Avoid walking on wet floors.  Keep items that you use a lot in easy-to-reach places.  If you need to reach something above you, use a strong step stool that has a grab bar.  Keep electrical cords out of the way.  Do not use floor polish or wax that makes floors slippery. If you must use wax, use non-skid floor wax.  Do not have throw rugs and other things on the floor that can make you trip. What can I do with my stairs?  Do not leave any items on the stairs.  Make sure that there are handrails on both sides of the stairs and use them. Fix handrails that are broken  or loose. Make sure that handrails are as long as the stairways.  Check any carpeting to make sure that it is firmly attached to the stairs. Fix any carpet that is loose or worn.  Avoid having throw rugs at the top or bottom of the stairs. If you do have throw rugs, attach them to the floor with carpet tape.  Make sure that you have a light switch at the top of the stairs and the bottom of the stairs. If you do not have them, ask someone to add them for you. What else can I do to help prevent falls?  Wear shoes that:  Do not have high heels.  Have rubber bottoms.  Are comfortable and fit you well.  Are closed at the toe. Do not wear sandals.  If you use a stepladder:  Make sure that it is fully opened. Do not climb a closed stepladder.  Make sure that both sides of the stepladder are locked into place.  Ask someone to hold it for you, if possible.  Clearly mark and make sure that you can see:  Any grab bars or handrails.  First and last steps.  Where the edge of each step is.  Use tools  that help you move around (mobility aids) if they are needed. These include:  Canes.  Walkers.  Scooters.  Crutches.  Turn on the lights when you go into a dark area. Replace any light bulbs as soon as they burn out.  Set up your furniture so you have a clear path. Avoid moving your furniture around.  If any of your floors are uneven, fix them.  If there are any pets around you, be aware of where they are.  Review your medicines with your doctor. Some medicines can make you feel dizzy. This can increase your chance of falling. Ask your doctor what other things that you can do to help prevent falls. This information is not intended to replace advice given to you by your health care provider. Make sure you discuss any questions you have with your health care provider. Document Released: 05/29/2009 Document Revised: 01/08/2016 Document Reviewed: 09/06/2014 Elsevier Interactive Patient Education  2017 [employer].

## 2019-06-07 ENCOUNTER — Encounter: Payer: Self-pay | Admitting: Family Medicine

## 2019-06-08 ENCOUNTER — Encounter: Payer: Self-pay | Admitting: Family Medicine

## 2019-06-08 NOTE — Telephone Encounter (Signed)
Tiffany, do you know anything about this patients blood pressure cuff, something about you were going to be able to get it covered through insurance?

## 2019-06-19 ENCOUNTER — Other Ambulatory Visit: Payer: Self-pay

## 2019-06-19 ENCOUNTER — Encounter: Payer: Self-pay | Admitting: Family Medicine

## 2019-06-19 ENCOUNTER — Ambulatory Visit (INDEPENDENT_AMBULATORY_CARE_PROVIDER_SITE_OTHER): Payer: PPO | Admitting: Family Medicine

## 2019-06-19 DIAGNOSIS — I10 Essential (primary) hypertension: Secondary | ICD-10-CM | POA: Diagnosis not present

## 2019-06-19 DIAGNOSIS — R0981 Nasal congestion: Secondary | ICD-10-CM | POA: Diagnosis not present

## 2019-06-19 DIAGNOSIS — R338 Other retention of urine: Secondary | ICD-10-CM | POA: Diagnosis not present

## 2019-06-19 DIAGNOSIS — N401 Enlarged prostate with lower urinary tract symptoms: Secondary | ICD-10-CM | POA: Diagnosis not present

## 2019-06-19 DIAGNOSIS — E782 Mixed hyperlipidemia: Secondary | ICD-10-CM

## 2019-06-19 MED ORDER — LISINOPRIL 20 MG PO TABS: 20.0000 mg | ORAL_TABLET | Freq: Every day | ORAL | 1 refills | Status: DC

## 2019-06-19 MED ORDER — FINASTERIDE 5 MG PO TABS: 5.0000 mg | ORAL_TABLET | Freq: Every day | ORAL | 1 refills | Status: DC

## 2019-06-19 MED ORDER — TAMSULOSIN HCL 0.4 MG PO CAPS: 0.4000 mg | ORAL_CAPSULE | Freq: Two times a day (BID) | ORAL | 1 refills | Status: DC

## 2019-06-19 MED ORDER — PREDNISONE 10 MG PO TABS: ORAL_TABLET | ORAL | 0 refills | Status: DC

## 2019-06-19 MED ORDER — OMEPRAZOLE 40 MG PO CPDR: 40.0000 mg | DELAYED_RELEASE_CAPSULE | Freq: Every day | ORAL | 1 refills | Status: DC

## 2019-06-19 NOTE — Assessment & Plan Note (Signed)
Under good control on current regimen. Continue current regimen. Continue to monitor. Call with any concerns. Refills given. Will get in for labs shortly.

## 2019-06-19 NOTE — Progress Notes (Signed)
There were no vitals taken for this visit.   Subjective:    Patient ID: Derrick Ball., male    DOB: 11-13-1955, 63 y.o.   MRN: [id number]  HPI: Derrick Sposito. is a 63 y.o. male  Chief Complaint  Patient presents with  . Follow-up  . Ear Pain    right, fluid in ear  . Sinus Problem   UPPER RESPIRATORY TRACT INFECTION- has not seen Dr. Richardson Landry recently Duration: 3-4 days Worst symptom: fluid in his R ear Fever: no Cough: yes Shortness of breath: no Wheezing: no Chest pain: no Chest tightness: no Chest congestion: yes Nasal congestion: yes Runny nose: no Post nasal drip: yes Sneezing: yes Sore throat: no Swollen glands: no Sinus pressure: no Headache: yes Face pain: no Toothache: no Ear pain: no  Ear pressure: yes "right Eyes red/itching:no Eye drainage/crusting: no  Vomiting: no Rash: no Fatigue: yes Sick contacts: no Strep contacts: no  Context: stable Recurrent sinusitis: yes Relief with OTC cold/cough medications: no  Treatments attempted: cold/sinus, mucinex and anti-histamine   HYPERTENSION / HYPERLIPIDEMIA Satisfied with current treatment? yes Duration of hypertension: chronic BP monitoring frequency: not checking BP medication side effects: no Past BP meds: lisinopril Duration of hyperlipidemia: chronic Cholesterol medication side effects: not on anything Cholesterol supplements: none Past cholesterol medications: none Medication compliance: excellent compliance Aspirin: no Recent stressors: yes Recurrent headaches: no Visual changes: no Palpitations: no Dyspnea: no Chest pain: no Lower extremity edema: no Dizzy/lightheaded: no  BPH BPH status: controlled Satisfied with current treatment?: yes Medication side effects: no Medication compliance: excellent compliance Duration: chronic Nocturia: 1-2x per night Urinary frequency:no Incomplete voiding: no Urgency: no Weak urinary stream: no Straining to start stream: no  Dysuria: no Onset: gradual Severity: mild  Relevant past medical, surgical, family and social history reviewed and updated as indicated. Interim medical history since our last visit reviewed. Allergies and medications reviewed and updated.  Review of Systems  Constitutional: Positive for fatigue. Negative for activity change, appetite change, chills, diaphoresis, fever and unexpected weight change.  HENT: Positive for congestion, postnasal drip, rhinorrhea and sinus pressure. Negative for dental problem, drooling, ear discharge, ear pain, facial swelling, hearing loss, mouth sores, nosebleeds, sinus pain, sneezing, sore throat, tinnitus, trouble swallowing and voice change.   Eyes: Negative.   Respiratory: Negative.   Cardiovascular: Negative.   Gastrointestinal: Negative.   Psychiatric/Behavioral: Negative.     Per HPI unless specifically indicated above     Objective:    There were no vitals taken for this visit.  Wt Readings from Last 3 Encounters:  06/06/19 256 lb (116.1 kg)  03/19/19 245 lb (111.1 kg)  03/06/19 253 lb (114.8 kg)    Physical Exam Vitals signs and nursing note reviewed.  Constitutional:      General: He is not in acute distress.    Appearance: Normal appearance. He is not ill-appearing, toxic-appearing or diaphoretic.  HENT:     Head: Normocephalic and atraumatic.     Right Ear: External ear normal.     Left Ear: External ear normal.     Nose: Nose normal.     Mouth/Throat:     Mouth: Mucous membranes are moist.     Pharynx: Oropharynx is clear.  Eyes:     General: No scleral icterus.       Right eye: No discharge.        Left eye: No discharge.     Conjunctiva/sclera: Conjunctivae normal.  Pupils: Pupils are equal, round, and reactive to light.  Neck:     Musculoskeletal: Normal range of motion.  Pulmonary:     Effort: Pulmonary effort is normal. No respiratory distress.     Comments: Speaking in full sentences Musculoskeletal: Normal range  of motion.  Skin:    Coloration: Skin is not jaundiced or pale.     Findings: No bruising, erythema, lesion or rash.  Neurological:     Mental Status: He is alert and oriented to person, place, and time. Mental status is at baseline.  Psychiatric:        Mood and Affect: Mood normal.        Behavior: Behavior normal.        Thought Content: Thought content normal.        Judgment: Judgment normal.     Results for orders placed or performed in visit on 10/25/18  Comprehensive metabolic panel  Result Value Ref Range   Glucose 101 (H) 65 - 99 mg/dL   BUN 17 8 - 27 mg/dL   Creatinine, Ser 0.92 0.76 - 1.27 mg/dL   GFR calc non Af Amer 89 >59 mL/min/1.73   GFR calc Af Amer 103 >59 mL/min/1.73   BUN/Creatinine Ratio 18 10 - 24   Sodium 143 134 - 144 mmol/L   Potassium 4.4 3.5 - 5.2 mmol/L   Chloride 106 96 - 106 mmol/L   CO2 21 20 - 29 mmol/L   Calcium 9.6 8.6 - 10.2 mg/dL   Total Protein 7.0 6.0 - 8.5 g/dL   Albumin 4.7 3.8 - 4.8 g/dL   Globulin, Total 2.3 1.5 - 4.5 g/dL   Albumin/Globulin Ratio 2.0 1.2 - 2.2   Bilirubin Total 0.4 0.0 - 1.2 mg/dL   Alkaline Phosphatase 61 39 - 117 IU/L   AST 49 (H) 0 - 40 IU/L   ALT 64 (H) 0 - 44 IU/L  Lipid Panel w/o Chol/HDL Ratio  Result Value Ref Range   Cholesterol, Total 187 100 - 199 mg/dL   Triglycerides 108 0 - 149 mg/dL   HDL 41 >39 mg/dL   VLDL Cholesterol Cal 22 5 - 40 mg/dL   LDL Calculated 124 (H) 0 - 99 mg/dL  CBC with Differential/Platelet  Result Value Ref Range   WBC 5.7 3.4 - 10.8 x10E3/uL   RBC 5.23 4.14 - 5.80 x10E6/uL   Hemoglobin 15.8 13.0 - 17.7 g/dL   Hematocrit 47.8 37.5 - 51.0 %   MCV 91 79 - 97 fL   MCH 30.2 26.6 - 33.0 pg   MCHC 33.1 31.5 - 35.7 g/dL   RDW 13.6 11.6 - 15.4 %   Platelets 196 150 - 450 x10E3/uL   Neutrophils 46 Not Estab. %   Lymphs 37 Not Estab. %   Monocytes 12 Not Estab. %   Eos 4 Not Estab. %   Basos 1 Not Estab. %   Neutrophils Absolute 2.7 1.4 - 7.0 x10E3/uL   Lymphocytes Absolute  2.1 0.7 - 3.1 x10E3/uL   Monocytes Absolute 0.7 0.1 - 0.9 x10E3/uL   EOS (ABSOLUTE) 0.2 0.0 - 0.4 x10E3/uL   Basophils Absolute 0.0 0.0 - 0.2 x10E3/uL   Immature Granulocytes 0 Not Estab. %   Immature Grans (Abs) 0.0 0.0 - 0.1 x10E3/uL      Assessment & Plan:   Problem List Items Addressed This Visit      Cardiovascular and Mediastinum   Benign hypertension    Unable to check BP today. Will get him  refills and get him in for physical and BP check and labs shortly. Call with any concerns.       Relevant Medications   lisinopril (ZESTRIL) 20 MG tablet     Genitourinary   Benign prostatic hyperplasia with lower urinary tract symptoms    Under good control on current regimen. Continue current regimen. Continue to monitor. Call with any concerns. Refills given. Will get in for labs shortly.      Relevant Medications   tamsulosin (FLOMAX) 0.4 MG CAPS capsule   finasteride (PROSCAR) 5 MG tablet     Other   Morbid obesity ([place])    Continue to work on diet and exercise with goal of losing 1-2lbs per week. Call with any concerns.       Hyperlipidemia    Under good control on current regimen. Continue current regimen. Continue to monitor. Call with any concerns. Refills given. Will get in for labs shortly.       Relevant Medications   lisinopril (ZESTRIL) 20 MG tablet    Other Visit Diagnoses    Chronic nasal congestion    -  Primary   Will treat with prednisone. Call if not improving and we will add antibiotic. Continue to monitor. Call with any concerns.        Follow up plan: Return ASAP Physical and labs.   . This visit was completed via FaceTime due to the restrictions of the COVID-19 pandemic. All issues as above were discussed and addressed. Physical exam was done as above through visual confirmation on FaceTime. If it was felt that the patient should be evaluated in the office, they were directed there. The patient verbally consented to this visit. . Location of the  patient: home . Location of the provider: home . Those involved with this call:  . Provider: Park Liter, DO . CMA: Tiffany Reel, CMA . Front Desk/Registration: Don Perking  . Time spent on call: 25 minutes with patient face to face via video conference. More than 50% of this time was spent in counseling and coordination of care. 40 minutes total spent in review of patient's record and preparation of their chart.

## 2019-06-19 NOTE — Assessment & Plan Note (Signed)
Unable to check BP today. Will get him refills and get him in for physical and BP check and labs shortly. Call with any concerns.

## 2019-06-19 NOTE — Assessment & Plan Note (Signed)
Continue to work on diet and exercise with goal of losing 1-2lbs per week. Call with any concerns.  

## 2019-06-21 ENCOUNTER — Encounter: Payer: Self-pay | Admitting: Family Medicine

## 2019-06-25 ENCOUNTER — Encounter: Payer: Self-pay | Admitting: Family Medicine

## 2019-06-26 MED ORDER — AMOXICILLIN-POT CLAVULANATE 875-125 MG PO TABS: 1.0000 | ORAL_TABLET | Freq: Two times a day (BID) | ORAL | 0 refills | Status: DC

## 2019-06-27 ENCOUNTER — Other Ambulatory Visit: Payer: Self-pay | Admitting: Family Medicine

## 2019-06-27 DIAGNOSIS — N401 Enlarged prostate with lower urinary tract symptoms: Secondary | ICD-10-CM | POA: Diagnosis not present

## 2019-06-27 DIAGNOSIS — R339 Retention of urine, unspecified: Secondary | ICD-10-CM | POA: Diagnosis not present

## 2019-06-27 DIAGNOSIS — N319 Neuromuscular dysfunction of bladder, unspecified: Secondary | ICD-10-CM | POA: Diagnosis not present

## 2019-06-27 DIAGNOSIS — N35919 Unspecified urethral stricture, male, unspecified site: Secondary | ICD-10-CM | POA: Diagnosis not present

## 2019-06-27 NOTE — Telephone Encounter (Signed)
Routing to provider  

## 2019-06-27 NOTE — Telephone Encounter (Signed)
Requested medication (s) are due for refill today: yes  Requested medication (s) are on the active medication list: yes  Last refill:  05/25/2019  Future visit scheduled: yes  Notes to clinic:  Refill cannot be delegated   Requested Prescriptions  Pending Prescriptions Disp Refills   cyclobenzaprine (FLEXERIL) 5 MG tablet [Pharmacy Med Name: Cyclobenzaprine HCl 5 MG Oral Tablet] 30 tablet 0    Sig: TAKE 1 TABLET BY MOUTH AT BEDTIME AS NEEDED FOR  MUSCLE  SPASMS.     Not Delegated - Analgesics:  Muscle Relaxants Failed - 06/27/2019 11:13 AM      Failed - This refill cannot be delegated      Passed - Valid encounter within last 6 months    Recent Outpatient Visits          1 week ago Chronic nasal congestion   [place], Megan P, DO   1 month ago Upper respiratory tract infection, unspecified type   [facility], [place], [place]   3 months ago Contact dermatitis, unspecified contact dermatitis type, unspecified trigger   [employer], [place], DO   4 months ago [place], Carrizo T, NP   4 months ago [place], Slater T, NP      Future Appointments            In 3 weeks Johnson, Barb Merino, DO [employer], [facility]

## 2019-07-23 ENCOUNTER — Other Ambulatory Visit: Payer: Self-pay

## 2019-07-23 ENCOUNTER — Ambulatory Visit
Admission: RE | Admit: 2019-07-23 | Discharge: 2019-07-23 | Disposition: A | Discharge status: Home / Self Care | Payer: PPO | Source: Ambulatory Visit | Attending: Family Medicine | Admitting: Family Medicine

## 2019-07-23 ENCOUNTER — Encounter: Payer: Self-pay | Admitting: Family Medicine

## 2019-07-23 ENCOUNTER — Ambulatory Visit (INDEPENDENT_AMBULATORY_CARE_PROVIDER_SITE_OTHER): Payer: PPO | Admitting: Family Medicine

## 2019-07-23 VITALS — BP 119/73 | HR 82 | Temp 98.2°F

## 2019-07-23 DIAGNOSIS — M25511 Pain in right shoulder: Secondary | ICD-10-CM

## 2019-07-23 DIAGNOSIS — M542 Cervicalgia: Secondary | ICD-10-CM

## 2019-07-23 DIAGNOSIS — K760 Fatty (change of) liver, not elsewhere classified: Secondary | ICD-10-CM | POA: Diagnosis not present

## 2019-07-23 DIAGNOSIS — N401 Enlarged prostate with lower urinary tract symptoms: Secondary | ICD-10-CM | POA: Diagnosis not present

## 2019-07-23 DIAGNOSIS — M25512 Pain in left shoulder: Secondary | ICD-10-CM | POA: Diagnosis not present

## 2019-07-23 DIAGNOSIS — E559 Vitamin D deficiency, unspecified: Secondary | ICD-10-CM

## 2019-07-23 DIAGNOSIS — E782 Mixed hyperlipidemia: Secondary | ICD-10-CM

## 2019-07-23 DIAGNOSIS — R338 Other retention of urine: Secondary | ICD-10-CM

## 2019-07-23 DIAGNOSIS — Z0000 Encounter for general adult medical examination without abnormal findings: Secondary | ICD-10-CM | POA: Diagnosis not present

## 2019-07-23 DIAGNOSIS — I10 Essential (primary) hypertension: Secondary | ICD-10-CM

## 2019-07-23 DIAGNOSIS — M47812 Spondylosis without myelopathy or radiculopathy, cervical region: Secondary | ICD-10-CM | POA: Diagnosis not present

## 2019-07-23 DIAGNOSIS — M19011 Primary osteoarthritis, right shoulder: Secondary | ICD-10-CM | POA: Diagnosis not present

## 2019-07-23 DIAGNOSIS — M19012 Primary osteoarthritis, left shoulder: Secondary | ICD-10-CM | POA: Diagnosis not present

## 2019-07-23 LAB — UA/M W/RFLX CULTURE, ROUTINE
Bilirubin, UA: NEGATIVE
Glucose, UA: NEGATIVE
Ketones, UA: NEGATIVE
Leukocytes,UA: NEGATIVE
Nitrite, UA: NEGATIVE
Protein,UA: NEGATIVE
RBC, UA: NEGATIVE
Specific Gravity, UA: 1.015 (ref 1.005–1.030)
Urobilinogen, Ur: 0.2 mg/dL (ref 0.2–1.0)
pH, UA: 7 (ref 5.0–7.5)

## 2019-07-23 LAB — MICROALBUMIN, URINE WAIVED
Creatinine, Urine Waived: 50 mg/dL (ref 10–300)
Microalb, Ur Waived: 10 mg/L (ref 0–19)
Microalb/Creat Ratio: <30 mg/g (ref <30)

## 2019-07-23 MED ORDER — FAMOTIDINE 20 MG PO TABS: 20.0000 mg | ORAL_TABLET | Freq: Two times a day (BID) | ORAL | 1 refills | Status: DC

## 2019-07-23 MED ORDER — ACYCLOVIR 400 MG PO TABS: 400.0000 mg | ORAL_TABLET | Freq: Two times a day (BID) | ORAL | 1 refills | Status: DC

## 2019-07-23 MED ORDER — HYDROXYZINE HCL 50 MG PO TABS: 50.0000 mg | ORAL_TABLET | Freq: Three times a day (TID) | ORAL | 3 refills | Status: DC | PRN

## 2019-07-23 MED ORDER — MONTELUKAST SODIUM 10 MG PO TABS: 10.0000 mg | ORAL_TABLET | Freq: Every day | ORAL | 3 refills | Status: DC

## 2019-07-23 NOTE — Progress Notes (Signed)
BP 119/73   Pulse 82   Temp 98.2 F (36.8 C)   SpO2 94%    Subjective:    Patient ID: Derrick Sandoval., male    DOB: April 06, 1956, 63 y.o.   MRN: [id number]  HPI: Derrick Sandoval. is a 63 y.o. male presenting on 07/23/2019 for comprehensive medical examination. Current medical complaints include:  HYPERTENSION / HYPERLIPIDEMIA Satisfied with current treatment? yes Duration of hypertension: chronic BP monitoring frequency: not checking BP medication side effects: no Past BP meds: lisinopril Duration of hyperlipidemia: chronic Cholesterol medication side effects: not on anything Cholesterol supplements: none Past cholesterol medications: none Medication compliance: excellent compliance Aspirin: yes Recent stressors: no Recurrent headaches: no Visual changes: no Palpitations: no Dyspnea: no Chest pain: no Lower extremity edema: no Dizzy/lightheaded: no  BPH BPH status: stable Satisfied with current treatment?: yes Medication side effects: no Medication compliance: excellent compliance Duration: chronic Nocturia: no Urinary frequency:yes Incomplete voiding: yes Urgency: no Weak urinary stream: no Straining to start stream: no Dysuria: no Onset: gradual Severity: moderate  Interim Problems from his last visit: no  Depression Screen done today and results listed below:  Depression screen [facility] 2/9 06/06/2019 11/24/2018 10/25/2018 04/21/2018 06/06/2017  Decreased Interest 0 0 0 0 0  Down, Depressed, Hopeless 0 0 0 0 0  PHQ - 2 Score 0 0 0 0 0  Altered sleeping - 0 3 - -  Tired, decreased energy - 0 2 - -  Change in appetite - 0 2 - -  Feeling bad or failure about yourself  - 0 0 - -  Trouble concentrating - 1 2 - -  Moving slowly or fidgety/restless - 0 0 - -  Suicidal thoughts - 0 0 - -  PHQ-9 Score - 1 9 - -  Difficult doing work/chores - Not difficult at all Somewhat difficult - -     Past Medical History:  Past Medical History:  Diagnosis Date  .  Allergy   . Arthritis   . Cough    nonproductive no fever saw at dr 05-26-18  . GERD (gastroesophageal reflux disease)   . Hyperlipidemia   . Hypertension   . Meningitis due to viruses 1968    Surgical History:  Past Surgical History:  Procedure Laterality Date  . COLONOSCOPY WITH PROPOFOL N/A 05/12/2018   Procedure: COLONOSCOPY WITH PROPOFOL;  Surgeon: Jonathon Bellows, MD;  Location: [facility] ENDOSCOPY;  Service: Gastroenterology;  Laterality: N/A;  . ESOPHAGOGASTRODUODENOSCOPY (EGD) WITH PROPOFOL N/A 05/12/2018   Procedure: ESOPHAGOGASTRODUODENOSCOPY (EGD) WITH PROPOFOL;  Surgeon: Jonathon Bellows, MD;  Location: [facility] ENDOSCOPY;  Service: Gastroenterology;  Laterality: N/A;  . ESOPHAGOGASTRODUODENOSCOPY (EGD) WITH PROPOFOL N/A 06/01/2018   Procedure: ESOPHAGOGASTRODUODENOSCOPY (EGD) WITH PROPOFOL;  Surgeon: Milus Banister, MD;  Location: [facility] ENDOSCOPY;  Service: Endoscopy;  Laterality: N/A;  . EUS N/A 06/01/2018   Procedure: UPPER ENDOSCOPIC ULTRASOUND (EUS) RADIAL;  Surgeon: Milus Banister, MD;  Location: [facility] ENDOSCOPY;  Service: Endoscopy;  Laterality: N/A;  . JOINT REPLACEMENT Bilateral   . NASAL SINUS SURGERY    . TOTAL KNEE ARTHROPLASTY      Medications:  Current Outpatient Medications on File Prior to Visit  Medication Sig  . albuterol (PROVENTIL HFA;VENTOLIN HFA) 108 (90 Base) MCG/ACT inhaler Inhale 1-2 puffs into the lungs every 6 (six) hours as needed for wheezing or shortness of breath.  . Ascorbic Acid (VITAMIN C) 1000 MG tablet Take 1,000 mg by mouth at bedtime.   Marland Kitchen aspirin EC 81 MG tablet  Take 81 mg by mouth daily.  . cholecalciferol (VITAMIN D) 1000 UNITS tablet Take 1,000 Units by mouth daily.   . cyclobenzaprine (FLEXERIL) 5 MG tablet TAKE 1 TABLET BY MOUTH AT BEDTIME AS NEEDED FOR  MUSCLE  SPASMS.  . finasteride (PROSCAR) 5 MG tablet Take 1 tablet (5 mg total) by mouth daily.  . fluticasone (FLONASE) 50 MCG/ACT nasal spray Place 1 spray into both nostrils 2 (two) times daily.   Marland Kitchen lisinopril (ZESTRIL) 20 MG tablet Take 1 tablet (20 mg total) by mouth daily.  Marland Kitchen loratadine (CLARITIN) 10 MG tablet Take 1 tablet (10 mg total) by mouth daily.  . Multiple Vitamins-Minerals (MULTIVITAMIN WITH MINERALS) tablet Take 1 tablet by mouth daily.  Marland Kitchen nystatin cream (MYCOSTATIN) Apply 1 application topically 2 (two) times daily.  Marland Kitchen omeprazole (PRILOSEC) 40 MG capsule Take 1 capsule (40 mg total) by mouth daily.  . tamsulosin (FLOMAX) 0.4 MG CAPS capsule Take 1 capsule (0.4 mg total) by mouth 2 (two) times daily.  Marland Kitchen triamcinolone ointment (KENALOG) 0.1 % Apply 1 application topically 2 (two) times daily.   No current facility-administered medications on file prior to visit.     Allergies:  No Known Allergies  Social History:  Social History   Socioeconomic History  . Marital status: Married    Spouse name: Not on file  . Number of children: Not on file  . Years of education: 10th grade, GED  . Highest education level: GED or equivalent  Occupational History  . Occupation: disabled   Social Needs  . Financial resource strain: Not hard at all  . Food insecurity    Worry: Never true    Inability: Never true  . Transportation needs    Medical: No    Non-medical: No  Tobacco Use  . Smoking status: Never Smoker  . Smokeless tobacco: Never Used  Substance and Sexual Activity  . Alcohol use: Not Currently    Alcohol/week: 0.0 standard drinks    Comment: occasionally, twice a year   . Drug use: No  . Sexual activity: Not Currently  Lifestyle  . Physical activity    Days per week: 0 days    Minutes per session: 0 min  . Stress: Not at all  Relationships  . Social connections    Talks on phone: More than three times a week    Gets together: More than three times a week    Attends religious service: Never    Active member of club or organization: No    Attends meetings of clubs or organizations: Never    Relationship status: Married  . Intimate partner violence     Fear of current or ex partner: No    Emotionally abused: No    Physically abused: No    Forced sexual activity: No  Other Topics Concern  . Not on file  Social History Narrative   [employer] 3x a week    Social History   Tobacco Use  Smoking Status Never Smoker  Smokeless Tobacco Never Used   Social History   Substance and Sexual Activity  Alcohol Use Not Currently  . Alcohol/week: 0.0 standard drinks   Comment: occasionally, twice a year     Family History:  Family History  Problem Relation Age of Onset  . Breast cancer Mother   . Cancer Father        unsure, passed away before it was confirmed  . Pancreatic cancer Daughter   . Diabetes Neg Hx   .  Heart disease Neg Hx   . Hypertension Neg Hx   . Stroke Neg Hx   . COPD Neg Hx   . Colon cancer Neg Hx     Past medical history, surgical history, medications, allergies, family history and social history reviewed with patient today and changes made to appropriate areas of the chart.   Review of Systems  Constitutional: Negative.   HENT: Positive for congestion. Negative for ear discharge, ear pain, hearing loss, nosebleeds, sinus pain, sore throat and tinnitus.   Eyes: Negative.   Respiratory: Positive for cough. Negative for hemoptysis, sputum production, shortness of breath, wheezing and stridor.   Cardiovascular: Negative.   Gastrointestinal: Positive for nausea. Negative for abdominal pain, blood in stool, constipation, diarrhea, heartburn, melena and vomiting.  Genitourinary: Negative.   Musculoskeletal: Positive for myalgias and neck pain. Negative for back pain, falls and joint pain.  Skin: Negative.   Psychiatric/Behavioral: Negative.        Angry sometimes    All other ROS negative except what is listed above and in the HPI.      Objective:    BP 119/73   Pulse 82   Temp 98.2 F (36.8 C)   SpO2 94%   Wt Readings from Last 3 Encounters:  06/06/19 256 lb (116.1 kg)  03/19/19 245 lb (111.1 kg)   03/06/19 253 lb (114.8 kg)    Physical Exam Vitals signs and nursing note reviewed.  Constitutional:      General: He is not in acute distress.    Appearance: Normal appearance. He is obese. He is not ill-appearing, toxic-appearing or diaphoretic.  HENT:     Head: Normocephalic and atraumatic.     Right Ear: Tympanic membrane, ear canal and external ear normal. There is no impacted cerumen.     Left Ear: Tympanic membrane, ear canal and external ear normal. There is no impacted cerumen.     Nose: Nose normal. No congestion or rhinorrhea.     Mouth/Throat:     Mouth: Mucous membranes are moist.     Pharynx: Oropharynx is clear. No oropharyngeal exudate or posterior oropharyngeal erythema.  Eyes:     General: No scleral icterus.       Right eye: No discharge.        Left eye: No discharge.     Extraocular Movements: Extraocular movements intact.     Conjunctiva/sclera: Conjunctivae normal.     Pupils: Pupils are equal, round, and reactive to light.  Neck:     Musculoskeletal: Normal range of motion and neck supple. No neck rigidity or muscular tenderness.     Vascular: No carotid bruit.  Cardiovascular:     Rate and Rhythm: Normal rate and regular rhythm.     Pulses: Normal pulses.     Heart sounds: No murmur. No friction rub. No gallop.   Pulmonary:     Effort: Pulmonary effort is normal. No respiratory distress.     Breath sounds: Normal breath sounds. No stridor. No wheezing, rhonchi or rales.  Chest:     Chest wall: No tenderness.  Abdominal:     General: Abdomen is flat. Bowel sounds are normal. There is no distension.     Palpations: Abdomen is soft. There is no mass.     Tenderness: There is no abdominal tenderness. There is no right CVA tenderness, left CVA tenderness, guarding or rebound.     Hernia: No hernia is present.  Genitourinary:    Comments: Genital exam deferred with shared  decision making Musculoskeletal:        General: No swelling, tenderness, deformity  or signs of injury.     Right lower leg: No edema.     Left lower leg: No edema.  Lymphadenopathy:     Cervical: No cervical adenopathy.  Skin:    General: Skin is warm and dry.     Capillary Refill: Capillary refill takes less than 2 seconds.     Coloration: Skin is not jaundiced or pale.     Findings: No bruising, erythema, lesion or rash.  Neurological:     General: No focal deficit present.     Mental Status: He is alert and oriented to person, place, and time.     Cranial Nerves: No cranial nerve deficit.     Sensory: No sensory deficit.     Motor: No weakness.     Coordination: Coordination normal.     Gait: Gait normal.     Deep Tendon Reflexes: Reflexes normal.  Psychiatric:        Mood and Affect: Mood normal.        Behavior: Behavior normal.        Thought Content: Thought content normal.        Judgment: Judgment normal.     Results for orders placed or performed in visit on 07/23/19  Microalbumin, Urine Waived  Result Value Ref Range   Microalb, Ur Waived 10 0 - 19 mg/L   Creatinine, Urine Waived 50 10 - 300 mg/dL   Microalb/Creat Ratio <30 <30 mg/g  UA/M w/rflx Culture, Routine   Specimen: Urine   URINE  Result Value Ref Range   Specific Gravity, UA 1.015 1.005 - 1.030   pH, UA 7.0 5.0 - 7.5   Color, UA Yellow Yellow   Appearance Ur Clear Clear   Leukocytes,UA Negative Negative   Protein,UA Negative Negative/Trace   Glucose, UA Negative Negative   Ketones, UA Negative Negative   RBC, UA Negative Negative   Bilirubin, UA Negative Negative   Urobilinogen, Ur 0.2 0.2 - 1.0 mg/dL   Nitrite, UA Negative Negative      Assessment & Plan:   Problem List Items Addressed This Visit      Cardiovascular and Mediastinum   Benign hypertension    Under good control on current regimen. Continue current regimen. Continue to monitor. Call with any concerns. Refills given. Labs drawn today.        Relevant Orders   CBC with Differential OUT   Comp Met (CMET)    Microalbumin, Urine Waived (Completed)   TSH   UA/M w/rflx Culture, Routine (Completed)     Digestive   Fatty liver    Checking labs today. Await results. Call with any concerns.         Genitourinary   Benign prostatic hyperplasia with lower urinary tract symptoms    Under good control on current regimen. Continue current regimen. Continue to monitor. Call with any concerns. Refills given. Labs drawn today.       Relevant Orders   CBC with Differential OUT   PSA   UA/M w/rflx Culture, Routine (Completed)     Other   Hyperlipidemia    Under good control on current regimen. Continue current regimen. Continue to monitor. Call with any concerns. Refills given. Labs drawn today.       Relevant Orders   CBC with Differential OUT   Comp Met (CMET)   Lipid Panel w/o Chol/HDL Ratio OUT   Vitamin D  deficiency    Checking labs today. Await results. Call with any concerns.       Relevant Orders   CBC with Differential OUT   Vit D  25 hydroxy (rtn osteoporosis monitoring)    Other Visit Diagnoses    Routine general medical examination at a health care facility    -  Primary   Vaccines up to date. Screening labs checked today. Colonoscopy up to date. Continue diet and exercise. Call with any concerns.    Neck pain       Will check x-ray. Await results. Treat as needed.    Relevant Orders   DG Cervical Spine Complete   Acute pain of both shoulders       Will check x-ray. Await results. Treat as needed.    Relevant Orders   DG Shoulder Left   DG Shoulder Right   DG Cervical Spine Complete       Discussed aspirin prophylaxis for myocardial infarction prevention and decision was made to continue ASA  LABORATORY TESTING:  Health maintenance labs ordered today as discussed above.   The natural history of prostate cancer and ongoing controversy regarding screening and potential treatment outcomes of prostate cancer has been discussed with the patient. The meaning of a false  positive PSA and a false negative PSA has been discussed. He indicates understanding of the limitations of this screening test and wishes  to proceed with screening PSA testing.   IMMUNIZATIONS:   - Tdap: Tetanus vaccination status reviewed: last tetanus booster within 10 years. - Influenza: Up to date - Pneumovax: N/A - Zostavax vaccine: Up to date  SCREENING: - Colonoscopy: Up to date  Discussed with patient purpose of the colonoscopy is to detect colon cancer at curable precancerous or early stages   PATIENT COUNSELING:    Sexuality: Discussed sexually transmitted diseases, partner selection, use of condoms, avoidance of unintended pregnancy  and contraceptive alternatives.   Advised to avoid cigarette smoking.  I discussed with the patient that most people either abstain from alcohol or drink within safe limits (<=14/week and <=4 drinks/occasion for males, <=7/weeks and <= 3 drinks/occasion for females) and that the risk for alcohol disorders and other health effects rises proportionally with the number of drinks per week and how often a drinker exceeds daily limits.  Discussed cessation/primary prevention of drug use and availability of treatment for abuse.   Diet: Encouraged to adjust caloric intake to maintain  or achieve ideal body weight, to reduce intake of dietary saturated fat and total fat, to limit sodium intake by avoiding high sodium foods and not adding table salt, and to maintain adequate dietary potassium and calcium preferably from fresh fruits, vegetables, and low-fat dairy products.    stressed the importance of regular exercise  Injury prevention: Discussed safety belts, safety helmets, smoke detector, smoking near bedding or upholstery.   Dental health: Discussed importance of regular tooth brushing, flossing, and dental visits.   Follow up plan: NEXT PREVENTATIVE PHYSICAL DUE IN 1 YEAR. Return in about 6 months (around 01/21/2020).

## 2019-07-23 NOTE — Assessment & Plan Note (Signed)
Checking labs today. Await results. Call with any concerns.  

## 2019-07-23 NOTE — Assessment & Plan Note (Signed)
Under good control on current regimen. Continue current regimen. Continue to monitor. Call with any concerns. Refills given. Labs drawn today.   

## 2019-07-23 NOTE — Patient Instructions (Signed)

## 2019-07-24 ENCOUNTER — Encounter: Payer: Self-pay | Admitting: Family Medicine

## 2019-07-24 ENCOUNTER — Other Ambulatory Visit: Payer: Self-pay | Admitting: Family Medicine

## 2019-07-24 DIAGNOSIS — M47812 Spondylosis without myelopathy or radiculopathy, cervical region: Secondary | ICD-10-CM

## 2019-07-24 LAB — VITAMIN D 25 HYDROXY (VIT D DEFICIENCY, FRACTURES): Vit D, 25-Hydroxy: 27.9 ng/mL — ABNORMAL LOW (ref 30.0–100.0)

## 2019-07-24 LAB — COMPREHENSIVE METABOLIC PANEL
ALT: 57 IU/L — ABNORMAL HIGH (ref 0–44)
AST: 40 IU/L (ref 0–40)
Albumin/Globulin Ratio: 1.6 (ref 1.2–2.2)
Albumin: 4.4 g/dL (ref 3.8–4.8)
Alkaline Phosphatase: 64 IU/L (ref 39–117)
BUN/Creatinine Ratio: 15 (ref 10–24)
BUN: 15 mg/dL (ref 8–27)
Bilirubin Total: 0.3 mg/dL (ref 0.0–1.2)
CO2: 21 mmol/L (ref 20–29)
Calcium: 9.4 mg/dL (ref 8.6–10.2)
Chloride: 104 mmol/L (ref 96–106)
Creatinine, Ser: 1.01 mg/dL (ref 0.76–1.27)
GFR calc Af Amer: 92 mL/min/{1.73_m2} (ref >59)
GFR calc non Af Amer: 80 mL/min/{1.73_m2} (ref >59)
Globulin, Total: 2.7 g/dL (ref 1.5–4.5)
Glucose: 87 mg/dL (ref 65–99)
Potassium: 4.4 mmol/L (ref 3.5–5.2)
Sodium: 139 mmol/L (ref 134–144)
Total Protein: 7.1 g/dL (ref 6.0–8.5)

## 2019-07-24 LAB — LIPID PANEL W/O CHOL/HDL RATIO
Cholesterol, Total: 182 mg/dL (ref 100–199)
HDL: 41 mg/dL (ref >39)
LDL Chol Calc (NIH): 111 mg/dL — ABNORMAL HIGH (ref 0–99)
Triglycerides: 169 mg/dL — ABNORMAL HIGH (ref 0–149)
VLDL Cholesterol Cal: 30 mg/dL (ref 5–40)

## 2019-07-24 LAB — CBC WITH DIFFERENTIAL/PLATELET
Basophils Absolute: 0 10*3/uL (ref 0.0–0.2)
Basos: 1 %
EOS (ABSOLUTE): 0.3 10*3/uL (ref 0.0–0.4)
Eos: 4 %
Hematocrit: 47.5 % (ref 37.5–51.0)
Hemoglobin: 16.1 g/dL (ref 13.0–17.7)
Immature Grans (Abs): 0 10*3/uL (ref 0.0–0.1)
Immature Granulocytes: 0 %
Lymphocytes Absolute: 2.7 10*3/uL (ref 0.7–3.1)
Lymphs: 42 %
MCH: 31.3 pg (ref 26.6–33.0)
MCHC: 33.9 g/dL (ref 31.5–35.7)
MCV: 92 fL (ref 79–97)
Monocytes Absolute: 0.8 10*3/uL (ref 0.1–0.9)
Monocytes: 13 %
Neutrophils Absolute: 2.5 10*3/uL (ref 1.4–7.0)
Neutrophils: 40 %
Platelets: 228 10*3/uL (ref 150–450)
RBC: 5.14 x10E6/uL (ref 4.14–5.80)
RDW: 13 % (ref 11.6–15.4)
WBC: 6.3 10*3/uL (ref 3.4–10.8)

## 2019-07-24 LAB — TSH: TSH: 2.94 u[IU]/mL (ref 0.450–4.500)

## 2019-07-24 LAB — PSA: Prostate Specific Ag, Serum: 0.1 ng/mL (ref 0.0–4.0)

## 2019-07-27 ENCOUNTER — Encounter: Payer: Self-pay | Admitting: Family Medicine

## 2019-08-03 ENCOUNTER — Encounter: Payer: Self-pay | Admitting: Family Medicine

## 2019-08-03 ENCOUNTER — Other Ambulatory Visit: Payer: Self-pay | Admitting: Family Medicine

## 2019-08-03 DIAGNOSIS — M47812 Spondylosis without myelopathy or radiculopathy, cervical region: Secondary | ICD-10-CM

## 2019-08-03 DIAGNOSIS — M542 Cervicalgia: Secondary | ICD-10-CM

## 2019-08-03 DIAGNOSIS — M25511 Pain in right shoulder: Secondary | ICD-10-CM

## 2019-08-05 ENCOUNTER — Encounter: Payer: Self-pay | Admitting: Family Medicine

## 2019-08-05 ENCOUNTER — Other Ambulatory Visit: Payer: Self-pay | Admitting: Family Medicine

## 2019-08-06 MED ORDER — ALBUTEROL SULFATE HFA 108 (90 BASE) MCG/ACT IN AERS: 1.0000 | INHALATION_SPRAY | Freq: Four times a day (QID) | RESPIRATORY_TRACT | 3 refills | Status: DC | PRN

## 2019-08-06 MED ORDER — CYCLOBENZAPRINE HCL 5 MG PO TABS: ORAL_TABLET | ORAL | 3 refills | Status: DC

## 2019-08-13 ENCOUNTER — Telehealth: Payer: Self-pay | Admitting: Family Medicine

## 2019-08-13 NOTE — Telephone Encounter (Signed)
Spoke to [facility] ortho, and they stated they can see him for his shoulder pain, but would need a separate referral to [facility] spine center for his neck and back pain

## 2019-08-14 NOTE — Telephone Encounter (Signed)
Please redirect referral- he has cervical DJD

## 2019-09-05 DIAGNOSIS — M7581 Other shoulder lesions, right shoulder: Secondary | ICD-10-CM | POA: Diagnosis not present

## 2019-09-05 DIAGNOSIS — M7582 Other shoulder lesions, left shoulder: Secondary | ICD-10-CM | POA: Diagnosis not present

## 2019-09-17 ENCOUNTER — Encounter: Payer: Self-pay | Admitting: Family Medicine

## 2019-09-18 ENCOUNTER — Encounter: Payer: Self-pay | Admitting: Nurse Practitioner

## 2019-09-18 ENCOUNTER — Encounter: Payer: Self-pay | Admitting: Family Medicine

## 2019-09-18 ENCOUNTER — Ambulatory Visit (INDEPENDENT_AMBULATORY_CARE_PROVIDER_SITE_OTHER): Payer: PPO | Admitting: Nurse Practitioner

## 2019-09-18 ENCOUNTER — Other Ambulatory Visit: Payer: Self-pay

## 2019-09-18 VITALS — BP 128/88 | HR 87 | Temp 98.1°F | Ht 67.0 in | Wt 259.8 lb

## 2019-09-18 DIAGNOSIS — G44209 Tension-type headache, unspecified, not intractable: Secondary | ICD-10-CM | POA: Diagnosis not present

## 2019-09-18 MED ORDER — KETOROLAC TROMETHAMINE 60 MG/2ML IM SOLN
30.0000 mg | Freq: Once | INTRAMUSCULAR | Status: AC
  Administered 2019-09-18: 30 mg via INTRAMUSCULAR

## 2019-09-18 MED ORDER — SUMATRIPTAN SUCCINATE 25 MG PO TABS: 25.0000 mg | ORAL_TABLET | Freq: Once | ORAL | 0 refills | Status: DC

## 2019-09-18 NOTE — Progress Notes (Signed)
BP 128/88 (BP Location: Left Arm, Cuff Size: Normal)   Pulse 87   Temp 98.1 F (36.7 C) (Oral)   Ht 5\' 7"  (1.702 m)   Wt 259 lb 12.8 oz (117.8 kg)   SpO2 97%   BMI 40.69 kg/m    Subjective:    Patient ID: Derrick Sandoval., male    DOB: 1956-02-27, 64 y.o.   MRN: [id number]  HPI: Derrick Sandoval. is a 64 y.o. male  Chief Complaint  Patient presents with  . Headache    Onoging appx 2 weeks  . Ear Pain    Ongoing 2 days  . Anxiety    Patient states he doesn't have anxiety, has "cabin fever". Also become easily annoyed.   HEADACHE  Duration: weeks, on and off Onset: sudden Severity: severe Quality: sharp and shooting Frequency: intermittent Location: temple bilaterally Headache duration: hours Radiation: no Time of day headache occurs:  Comes in morning and lasts all day Alleviating factors: ibuprofen, rest Aggravating factors: nothing Headache status at time of visit: current headache Treatments attempted: Treatments attempted: ibuprofen   Aura: no Nausea:  no Vomiting: no Photophobia:  no Phonophobia:  no Effect on social functioning:  no Confusion:  no Gait disturbance/ataxia:  no Behavioral changes:  yes Fevers:  no  Dizziness: no  Patient states since the headaches started, he has been more agitated.  For example, he reports getting upset at his dogs over things that he would not normally get upset over.  He feels that the agitation is due to the headaches.    EAR PAIN Duration: days Involved ear(s): bilateral Severity:  severe  Quality:  aching Fever: no  Otorrhea: no Upper respiratory infection symptoms: yes Pruritus: no Hearing loss: no Water immersion no Using Q-tips: no Recurrent otitis media: no Status: worse Treatments attempted: none  No Known Allergies  Outpatient Encounter Medications as of 09/18/2019  Medication Sig  . acyclovir (ZOVIRAX) 400 MG tablet Take 1 tablet (400 mg total) by mouth 2 (two) times daily.  Marland Kitchen albuterol  (VENTOLIN HFA) 108 (90 Base) MCG/ACT inhaler Inhale 1-2 puffs into the lungs every 6 (six) hours as needed for wheezing or shortness of breath.  . Ascorbic Acid (VITAMIN C) 1000 MG tablet Take 1,000 mg by mouth at bedtime.   Marland Kitchen aspirin EC 81 MG tablet Take 81 mg by mouth daily.  . cholecalciferol (VITAMIN D) 1000 UNITS tablet Take 1,000 Units by mouth daily.   . cyclobenzaprine (FLEXERIL) 5 MG tablet TAKE 1 TABLET BY MOUTH AT BEDTIME AS NEEDED FOR  MUSCLE  SPASMS.  . famotidine (PEPCID) 20 MG tablet Take 1 tablet (20 mg total) by mouth 2 (two) times daily.  . fluticasone (FLONASE) 50 MCG/ACT nasal spray Place 1 spray into both nostrils 2 (two) times daily.  . hydrOXYzine (ATARAX/VISTARIL) 50 MG tablet Take 1-2 tablets (50-100 mg total) by mouth 3 (three) times daily as needed.  Marland Kitchen lisinopril (ZESTRIL) 20 MG tablet Take 1 tablet (20 mg total) by mouth daily.  Marland Kitchen loratadine (CLARITIN) 10 MG tablet Take 1 tablet (10 mg total) by mouth daily.  . montelukast (SINGULAIR) 10 MG tablet Take 1 tablet (10 mg total) by mouth at bedtime.  . Multiple Vitamins-Minerals (MULTIVITAMIN WITH MINERALS) tablet Take 1 tablet by mouth daily.  Marland Kitchen nystatin cream (MYCOSTATIN) Apply 1 application topically 2 (two) times daily.  Marland Kitchen omeprazole (PRILOSEC) 40 MG capsule Take 1 capsule (40 mg total) by mouth daily.  . tamsulosin (FLOMAX) 0.4  MG CAPS capsule Take 1 capsule (0.4 mg total) by mouth 2 (two) times daily.  Marland Kitchen triamcinolone ointment (KENALOG) 0.1 % Apply 1 application topically 2 (two) times daily.  . SUMAtriptan (IMITREX) 25 MG tablet Take 1 tablet (25 mg total) by mouth once for 1 dose. May repeat in 2 hours if headache persists or recurs.  . [DISCONTINUED] finasteride (PROSCAR) 5 MG tablet Take 1 tablet (5 mg total) by mouth daily. (Patient not taking: Reported on 09/18/2019)  . [EXPIRED] ketorolac (TORADOL) injection 30 mg    No facility-administered encounter medications on file as of 09/18/2019.   Patient Active  Problem List   Diagnosis Date Noted  . Tension headache 09/18/2019  . Shingles 02/19/2019  . Gastric mass   . Genital herpes 01/23/2016  . Skin lesion of left arm 08/16/2015  . Nail abnormality 08/04/2015  . Short-term memory loss 08/04/2015  . Dysphagia 05/12/2015  . Vitamin D deficiency 05/12/2015  . Blood in the stool 05/12/2015  . GERD (gastroesophageal reflux disease) 01/29/2015  . OA (osteoarthritis) of knee 01/29/2015  . Fatty liver 01/28/2015  . Left ventricular hypertrophy 01/28/2015  . Morbid obesity ([place]) 01/28/2015  . Rhinitis, allergic 01/28/2015  . Elevated transaminase level 01/28/2015  . Hyperlipidemia 01/28/2015  . Benign hypertension 01/28/2015  . Benign prostatic hyperplasia with lower urinary tract symptoms 01/28/2015   Past Medical History:  Diagnosis Date  . Allergy   . Arthritis   . Cough    nonproductive no fever saw at dr 05-26-18  . GERD (gastroesophageal reflux disease)   . Hyperlipidemia   . Hypertension   . Meningitis due to viruses 1968   Depression screen [facility] 2/9 09/18/2019 06/06/2019 11/24/2018 10/25/2018 04/21/2018  Decreased Interest 3 0 0 0 0  Down, Depressed, Hopeless 1 0 0 0 0  PHQ - 2 Score 4 0 0 0 0  Altered sleeping 3 - 0 3 -  Tired, decreased energy 3 - 0 2 -  Change in appetite 3 - 0 2 -  Feeling bad or failure about yourself  0 - 0 0 -  Trouble concentrating 0 - 1 2 -  Moving slowly or fidgety/restless 0 - 0 0 -  Suicidal thoughts 0 - 0 0 -  PHQ-9 Score 13 - 1 9 -  Difficult doing work/chores - - Not difficult at all Somewhat difficult -   GAD 7 : Generalized Anxiety Score 09/18/2019 11/24/2018  Nervous, Anxious, on Edge 2 0  Control/stop worrying 0 0  Worry too much - different things 2 0  Trouble relaxing 2 0  Restless 2 0  Easily annoyed or irritable 3 1  Afraid - awful might happen 0 0  Total GAD 7 Score 11 1  Anxiety Difficulty Somewhat difficult Not difficult at all   Relevant past medical, surgical, family and social  history reviewed and updated as indicated. Interim medical history since our last visit reviewed.  Review of Systems  Constitutional: Negative.  Negative for activity change, diaphoresis, fatigue and fever.  HENT: Positive for congestion and ear pain. Negative for hearing loss, postnasal drip, rhinorrhea, sinus pressure, sinus pain and sneezing.   Eyes: Negative.  Negative for photophobia and visual disturbance.  Musculoskeletal: Negative.  Negative for gait problem.  Neurological: Positive for headaches. Negative for dizziness, speech difficulty, weakness, light-headedness and numbness.  Psychiatric/Behavioral: Positive for agitation. Negative for confusion and sleep disturbance. The patient is not nervous/anxious.     Per HPI unless specifically indicated above  Objective:    BP 128/88 (BP Location: Left Arm, Cuff Size: Normal)   Pulse 87   Temp 98.1 F (36.7 C) (Oral)   Ht 5\' 7"  (1.702 m)   Wt 259 lb 12.8 oz (117.8 kg)   SpO2 97%   BMI 40.69 kg/m   Wt Readings from Last 3 Encounters:  09/18/19 259 lb 12.8 oz (117.8 kg)  06/06/19 256 lb (116.1 kg)  03/19/19 245 lb (111.1 kg)    Physical Exam Vitals and nursing note reviewed.  Constitutional:      General: He is not in acute distress.    Appearance: He is well-developed and normal weight. He is not ill-appearing or toxic-appearing.  HENT:     Head: Normocephalic and atraumatic.     Mouth/Throat:     Mouth: Mucous membranes are moist.     Pharynx: Oropharynx is clear.  Eyes:     General: No scleral icterus.    Extraocular Movements: Extraocular movements intact.     Pupils: Pupils are equal, round, and reactive to light.  Cardiovascular:     Heart sounds: Normal heart sounds. No murmur.  Pulmonary:     Effort: Pulmonary effort is normal. No respiratory distress.     Breath sounds: Normal breath sounds.  Musculoskeletal:        General: Normal range of motion.     Cervical back: Normal range of motion and neck  supple. No rigidity.  Lymphadenopathy:     Cervical: No cervical adenopathy.  Neurological:     General: No focal deficit present.     Mental Status: He is alert and oriented to person, place, and time.     Motor: No weakness.     Gait: Gait normal.  Psychiatric:        Mood and Affect: Mood normal. Mood is not anxious or depressed.        Speech: Speech normal.        Behavior: Behavior normal. Behavior is not agitated.        Assessment & Plan:   Problem List Items Addressed This Visit      Other   Tension headache - Primary    Acute, ongoing.  No red flags on examination but appears to be causing some upper respiratory symptoms like nasal congestion and ear pain.  Patient attributes agitation and high scores on PHQ-9 and GAD-7 to headache, declines treatment for stress/anxiety/depression today.  Will give Toradol 30mg  IM in office today and start on Imitrex 25mg .  Explained that Imitrex should be taken at first in the evening and if 1 tablet does not work after 2 hours, 1 more can be taken, but maximum of 2 tablets per 24 hours.  Advised to follow up with [place] in a few days to ensure headache symptoms and mood are improving.  Also advised to get COVID-19 given acute symptoms and advised to isolate until results come back.  Patient verbalized understanding and will schedule testing.      Relevant Medications   SUMAtriptan (IMITREX) 25 MG tablet   Other Relevant Orders   Novel Coronavirus, NAA ([facility])       Follow up plan: Return in about 4 days (around 09/22/2019).

## 2019-09-18 NOTE — Assessment & Plan Note (Signed)
Acute, ongoing.  No red flags on examination but appears to be causing some upper respiratory symptoms like nasal congestion and ear pain.  Patient attributes agitation and high scores on PHQ-9 and GAD-7 to headache, declines treatment for stress/anxiety/depression today.  Will give Toradol 30mg  IM in office today and start on Imitrex 25mg .  Explained that Imitrex should be taken at first in the evening and if 1 tablet does not work after 2 hours, 1 more can be taken, but maximum of 2 tablets per 24 hours.  Advised to follow up with [place] in a few days to ensure headache symptoms and mood are improving.  Also advised to get COVID-19 given acute symptoms and advised to isolate until results come back.  Patient verbalized understanding and will schedule testing.

## 2019-09-18 NOTE — Patient Instructions (Addendum)
[email or web address]  Click "make an appointment" Tension Headache, Adult A tension headache is a feeling of pain, pressure, or aching in the head that is often felt over the front and sides of the head. The pain can be dull, or it can feel tight (constricting). There are two types of tension headache:  Episodic tension headache. This is when the headaches happen fewer than 15 days a month.  Chronic tension headache. This is when the headaches happen more than 15 days a month during a 27-month period. A tension headache can last from 30 minutes to several days. It is the most common kind of headache. Tension headaches are not normally associated with nausea or vomiting, and they do not get worse with physical activity. What are the causes? The exact cause of this condition is not known. Tension headaches are often triggered by stress, anxiety, or depression. Other triggers include:  Alcohol.  Too much caffeine or caffeine withdrawal.  Respiratory infections, such as colds, flu, or sinus infections.  Dental problems or teeth clenching.  Tiredness (fatigue).  Holding your head and neck in the same position for a long period of time, such as while using a computer.  Smoking.  Arthritis of the neck. What are the signs or symptoms? Symptoms of this condition include:  A feeling of pressure or tightness around the head.  Dull, aching head pain.  Pain over the front and sides of the head.  Tenderness in the muscles of the head, neck, and shoulders. How is this diagnosed? This condition may be diagnosed based on your symptoms, your medical history, and a physical exam. If your symptoms are severe or unusual, you may have imaging tests, such as a CT scan or an MRI of your head. Your vision may also be checked. How is this treated? This condition may be treated with lifestyle changes and with medicines that help relieve symptoms. Follow these instructions at home: Managing  pain  Take over-the-counter and prescription medicines only as told by your health care provider.  When you have a headache, lie down in a dark, quiet room.  If directed, apply ice to the head and neck: ? Put ice in a plastic bag. ? Place a towel between your skin and the bag. ? Leave the ice on for 20 minutes, 2-3 times a day.  If directed, apply heat to the back of your neck as often as told by your health care provider. Use the heat source that your health care provider recommends, such as a moist heat pack or a heating pad. ? Place a towel between your skin and the heat source. ? Leave the heat on for 20-30 minutes. ? Remove the heat if your skin turns bright red. This is especially important if you are unable to feel pain, heat, or cold. You may have a greater risk of getting burned. Eating and drinking  Eat meals on a regular schedule.  Limit alcohol intake to no more than 1 drink a day for nonpregnant women and 2 drinks a day for men. One drink equals 12 oz of beer, 5 oz of wine, or 1 oz of hard liquor.  Drink enough fluid to keep your urine pale yellow.  Decrease your caffeine intake, or stop using caffeine. Lifestyle  Get 7-9 hours of sleep each night, or get the amount of sleep recommended by your health care provider.  At bedtime, remove all electronic devices from your room. Electronic devices include computers, phones, and tablets.  Find ways  to manage your stress. Some things that can help relieve stress include: ? Exercise. ? Deep breathing exercises. ? Yoga. ? Listening to music. ? Positive mental imagery.  Try to sit up straight and avoid tensing your muscles.  Do not use any products that contain nicotine or tobacco, such as cigarettes and e-cigarettes. If you need help quitting, ask your health care provider. General instructions   Keep all follow-up visits as told by your health care provider. This is important.  Avoid any headache triggers. Keep a  headache journal to help find out what may trigger your headaches. For example, write down: ? What you eat and drink. ? How much sleep you get. ? Any change to your diet or medicines. Contact a health care provider if:  Your headache does not get better.  Your headache comes back.  You are sensitive to sounds, light, or smells because of a headache.  You have nausea or you vomit.  Your stomach hurts. Get help right away if:  You suddenly develop a very severe headache along with any of the following: ? A stiff neck. ? Nausea and vomiting. ? Confusion. ? Weakness. ? Double vision or loss of vision. ? Shortness of breath. ? Rash. ? Unusual sleepiness. ? Fever. ? Trouble speaking. ? Pain in your eyes or ears. ? Trouble walking or balancing. ? Feeling faint or passing out. Summary  A tension headache is a feeling of pain, pressure, or aching in the head that is often felt over the front and sides of the head.  A tension headache can last from 30 minutes to several days. It is the most common kind of headache.  This condition may be diagnosed based on your symptoms, your medical history, and a physical exam.  This condition may be treated with lifestyle changes and with medicines that help relieve symptoms. This information is not intended to replace advice given to you by your health care provider. Make sure you discuss any questions you have with your health care provider. Document Revised: 05/30/2019 Document Reviewed: 11/12/2016 Elsevier Patient Education  [place].

## 2019-09-19 ENCOUNTER — Telehealth: Payer: Self-pay | Admitting: Family Medicine

## 2019-09-19 NOTE — Chronic Care Management (AMB) (Signed)
  Chronic Care Management   Note  09/19/2019 Name: Amedee Cerrone. MRN: [id number] DOB: 06-05-1956  Kevin Fenton Yolanda Dockendorf. is a 64 y.o. year old male who is a primary care patient of Valerie Roys, DO. I reached out to [employer]. by phone today in response to a referral sent by Mr. Sherley Bounds Jr.'s health plan.     Mr. Mcanany was given information about Chronic Care Management services today including:  1. CCM service includes personalized support from designated clinical staff supervised by his physician, including individualized plan of care and coordination with other care providers 2. 24/7 contact phone numbers for assistance for urgent and routine care needs. 3. Service will only be billed when office clinical staff spend 20 minutes or more in a month to coordinate care. 4. Only one practitioner may furnish and bill the service in a calendar month. 5. The patient may stop CCM services at any time (effective at the end of the month) by phone call to the office staff. 6. The patient will be responsible for cost sharing (co-pay) of up to 20% of the service fee (after annual deductible is met).  Patient agreed to services and verbal consent obtained.   Follow up plan: Telephone appointment with care management team member scheduled for:10/31/2019  Noreene Larsson, [place], [place], [place] [address] Direct Dial: [phone] [email or web address] Website: [email or web address]

## 2019-09-20 ENCOUNTER — Encounter: Payer: Self-pay | Admitting: Family Medicine

## 2019-09-20 DIAGNOSIS — N319 Neuromuscular dysfunction of bladder, unspecified: Secondary | ICD-10-CM | POA: Diagnosis not present

## 2019-09-20 DIAGNOSIS — R339 Retention of urine, unspecified: Secondary | ICD-10-CM | POA: Diagnosis not present

## 2019-09-20 DIAGNOSIS — N35919 Unspecified urethral stricture, male, unspecified site: Secondary | ICD-10-CM | POA: Diagnosis not present

## 2019-09-20 DIAGNOSIS — N401 Enlarged prostate with lower urinary tract symptoms: Secondary | ICD-10-CM | POA: Diagnosis not present

## 2019-09-20 NOTE — Telephone Encounter (Signed)
Since this is a new problem, he will need an appointment

## 2019-09-24 ENCOUNTER — Ambulatory Visit: Payer: PPO | Admitting: Nurse Practitioner

## 2019-09-25 ENCOUNTER — Encounter: Payer: Self-pay | Admitting: Family Medicine

## 2019-09-26 ENCOUNTER — Encounter: Payer: Self-pay | Admitting: Family Medicine

## 2019-09-27 ENCOUNTER — Encounter: Payer: Self-pay | Admitting: Family Medicine

## 2019-09-27 ENCOUNTER — Ambulatory Visit: Payer: PPO | Attending: Internal Medicine

## 2019-09-27 DIAGNOSIS — Z20822 Contact with and (suspected) exposure to covid-19: Secondary | ICD-10-CM | POA: Diagnosis not present

## 2019-09-28 ENCOUNTER — Encounter: Payer: Self-pay | Admitting: Family Medicine

## 2019-09-28 ENCOUNTER — Telehealth: Payer: Self-pay | Admitting: Family Medicine

## 2019-09-28 LAB — NOVEL CORONAVIRUS, NAA: SARS-CoV-2, NAA: NOT DETECTED

## 2019-09-28 NOTE — Telephone Encounter (Signed)
Negative COVID results given. Patient results "NOT Detected." Caller expressed understanding. ° °

## 2019-10-01 ENCOUNTER — Encounter: Payer: Self-pay | Admitting: Family Medicine

## 2019-10-01 ENCOUNTER — Ambulatory Visit (INDEPENDENT_AMBULATORY_CARE_PROVIDER_SITE_OTHER): Payer: PPO | Admitting: Family Medicine

## 2019-10-01 ENCOUNTER — Telehealth: Payer: PPO | Admitting: Family Medicine

## 2019-10-01 ENCOUNTER — Other Ambulatory Visit: Payer: Self-pay

## 2019-10-01 VITALS — Wt 255.0 lb

## 2019-10-01 DIAGNOSIS — R300 Dysuria: Secondary | ICD-10-CM | POA: Diagnosis not present

## 2019-10-01 DIAGNOSIS — R6883 Chills (without fever): Secondary | ICD-10-CM

## 2019-10-01 MED ORDER — TADALAFIL 5 MG PO TABS: 5.0000 mg | ORAL_TABLET | Freq: Every day | ORAL | 2 refills | Status: DC | PRN

## 2019-10-01 MED ORDER — SULFAMETHOXAZOLE-TRIMETHOPRIM 800-160 MG PO TABS: 1.0000 | ORAL_TABLET | Freq: Two times a day (BID) | ORAL | 0 refills | Status: DC

## 2019-10-01 NOTE — Progress Notes (Signed)
Wt 255 lb (115.7 kg)   BMI 39.94 kg/m    Subjective:    Patient ID: Derrick Ball., male    DOB: [birth year], 64 y.o.   MRN: [id number]  HPI: Derrick Schnur. is a 64 y.o. male  Chief Complaint  Patient presents with  . Cough    dry. negative covid test, per patient  . Chills    sweating during night x the last 2 nights  . Urinary Tract Infection    pt states burns and hurts when he's doing his cath and bad smell in his private area. symptoms started last Friday    . This visit was completed via MyChart due to the restrictions of the COVID-19 pandemic. All issues as above were discussed and addressed. Physical exam was done as above through visual confirmation on MyChart. If it was felt that the patient should be evaluated in the office, they were directed there. The patient verbally consented to this visit. . Location of the patient: home . Location of the provider: work . Those involved with this call:  . Provider: Merrie Roof, PA-C . CMA: Lesle Chris, [place] . Front Desk/Registration: Jill Side  . Time spent on call: 15 minutes with patient face to face via video conference. More than 50% of this time was spent in counseling and coordination of care. 5 minutes total spent in review of patient's record and preparation of their chart. I verified patient identity using two factors (patient name and date of birth). Patient consents verbally to being seen via telemedicine visit today.   2-3 nights of sweats, chills, low grade fevers and pain with catheterizations. Was tested for COVID late last week and was negative. Denies abdominal pain, N/V/D, penile discharge or rashes. Taking OTC pain and fever reducers with mild temporary relief. Has a Dealer but has not yet seen them for this issue.   Relevant past medical, surgical, family and social history reviewed and updated as indicated. Interim medical history since our last visit reviewed. Allergies and medications  reviewed and updated.  Review of Systems  Per HPI unless specifically indicated above     Objective:    Wt 255 lb (115.7 kg)   BMI 39.94 kg/m   Wt Readings from Last 3 Encounters:  10/05/19 252 lb (114.3 kg)  10/03/19 252 lb (114.3 kg)  10/01/19 255 lb (115.7 kg)    Physical Exam Vitals and nursing note reviewed.  Constitutional:      General: He is not in acute distress.    Appearance: Normal appearance.  HENT:     Head: Atraumatic.     Right Ear: External ear normal.     Left Ear: External ear normal.     Nose: Nose normal. No congestion.     Mouth/Throat:     Mouth: Mucous membranes are moist.     Pharynx: Oropharynx is clear.  Eyes:     Extraocular Movements: Extraocular movements intact.     Conjunctiva/sclera: Conjunctivae normal.  Pulmonary:     Effort: Pulmonary effort is normal. No respiratory distress.  Abdominal:     Comments: Unable to perform abdominal exam due to virtual nature of visit  Musculoskeletal:        General: Normal range of motion.     Cervical back: Normal range of motion.  Skin:    General: Skin is dry.     Findings: No erythema or rash.  Neurological:     Mental Status: He is oriented  to person, place, and time.  Psychiatric:        Mood and Affect: Mood normal.        Thought Content: Thought content normal.        Judgment: Judgment normal.    Results for orders placed or performed in visit on 09/27/19  Novel Coronavirus, NAA ([facility])   Specimen: Nasopharyngeal(NP) swabs in vial transport medium   NASOPHARYNGE  TESTING  Result Value Ref Range   SARS-CoV-2, NAA Not Detected Not Detected      Assessment & Plan:   Problem List Items Addressed This Visit    None    Visit Diagnoses    Dysuria    -  Primary   Chills          Will start bactrim, push fluids and monitor closely for improvement. Unable to obtain U/A due to COVID protocols given his generalized sxs. Return precautions reviewed at length, and recommended close  follow up with Urologist  Follow up plan: Return if symptoms worsen or fail to improve.

## 2019-10-02 ENCOUNTER — Encounter: Payer: Self-pay | Admitting: Family Medicine

## 2019-10-03 ENCOUNTER — Ambulatory Visit (INDEPENDENT_AMBULATORY_CARE_PROVIDER_SITE_OTHER): Payer: PPO | Admitting: Family Medicine

## 2019-10-03 ENCOUNTER — Other Ambulatory Visit: Payer: Self-pay

## 2019-10-03 ENCOUNTER — Encounter: Payer: Self-pay | Admitting: Family Medicine

## 2019-10-03 VITALS — BP 114/79 | HR 98 | Temp 99.0°F | Ht 67.0 in | Wt 252.0 lb

## 2019-10-03 DIAGNOSIS — R339 Retention of urine, unspecified: Secondary | ICD-10-CM | POA: Diagnosis not present

## 2019-10-03 DIAGNOSIS — N5089 Other specified disorders of the male genital organs: Secondary | ICD-10-CM | POA: Diagnosis not present

## 2019-10-03 MED ORDER — DOXYCYCLINE HYCLATE 100 MG PO TABS: 100.0000 mg | ORAL_TABLET | Freq: Two times a day (BID) | ORAL | 0 refills | Status: DC

## 2019-10-03 NOTE — Progress Notes (Signed)
BP 114/79   Pulse 98   Temp 99 F (37.2 C) (Oral)   Ht 5\' 7"  (1.702 m)   Wt 252 lb (114.3 kg)   SpO2 96%   BMI 39.47 kg/m    Subjective:    Patient ID: Derrick Sandoval., male    DOB: 14-Mar-1956, 64 y.o.   MRN: [id number]  HPI: Divonte Fitzmorris. is a 64 y.o. male  Chief Complaint  Patient presents with  . Testicle Pain    right testicle swelling since yesterday morning. pt states he stopped taking the bactrim due to pain   Patient presenting today with going on a week of anorexia, lower abdominal pain, urinary hesitancy, pain when catheterizing, and fevers around 100.3-101 degrees. Started bactrim yesterday but then started getting swelling in right testicle so stopped it in case this was a side effect of the medication. Does have a Urologist he's followed by but has not been able to schedule there for this.   Relevant past medical, surgical, family and social history reviewed and updated as indicated. Interim medical history since our last visit reviewed. Allergies and medications reviewed and updated.  Review of Systems  Per HPI unless specifically indicated above     Objective:    BP 114/79   Pulse 98   Temp 99 F (37.2 C) (Oral)   Ht 5\' 7"  (1.702 m)   Wt 252 lb (114.3 kg)   SpO2 96%   BMI 39.47 kg/m   Wt Readings from Last 3 Encounters:  10/03/19 252 lb (114.3 kg)  10/01/19 255 lb (115.7 kg)  09/18/19 259 lb 12.8 oz (117.8 kg)    Physical Exam Vitals and nursing note reviewed.  Constitutional:      Appearance: Normal appearance.  HENT:     Head: Atraumatic.  Eyes:     Extraocular Movements: Extraocular movements intact.     Conjunctiva/sclera: Conjunctivae normal.  Cardiovascular:     Rate and Rhythm: Normal rate and regular rhythm.  Pulmonary:     Effort: Pulmonary effort is normal.     Breath sounds: Normal breath sounds.  Abdominal:     General: Bowel sounds are normal. There is no distension.     Palpations: Abdomen is soft.   Tenderness: There is abdominal tenderness (mild suprapubic ttp).  Genitourinary:    Penis: Normal.      Comments: Left testicle nontender to palpation, scrotal sac appears benign. Right scrotum erythematous, edematous and firm and tender to palpation Musculoskeletal:        General: Normal range of motion.     Cervical back: Normal range of motion and neck supple.  Skin:    General: Skin is warm and dry.  Neurological:     General: No focal deficit present.     Mental Status: He is oriented to person, place, and time.  Psychiatric:        Mood and Affect: Mood normal.        Thought Content: Thought content normal.        Judgment: Judgment normal.     Results for orders placed or performed in visit on 09/27/19  Novel Coronavirus, NAA ([facility])   Specimen: Nasopharyngeal(NP) swabs in vial transport medium   NASOPHARYNGE  TESTING  Result Value Ref Range   SARS-CoV-2, NAA Not Detected Not Detected      Assessment & Plan:   Problem List Items Addressed This Visit    None    Visit Diagnoses  Urinary retention    -  Primary   U/A showing UTI, start doxycycline, monitor closely for benefit. ER precautions given. STAT Urology referral placed for follow up   Relevant Orders   UA/M w/rflx Culture, Routine   Ambulatory referral to Urology   Testicular swelling, right       Appears infectious, but pt nervous to restart bactrim as this coincided with onset of edema. Will start doxycycline and placed STAT Urology referral      25 minutes spent today in direct care and counseling with patient.   Follow up plan: Return for as scheduled.

## 2019-10-04 ENCOUNTER — Ambulatory Visit: Payer: PPO | Admitting: Family Medicine

## 2019-10-05 ENCOUNTER — Encounter: Payer: Self-pay | Admitting: Urology

## 2019-10-05 ENCOUNTER — Ambulatory Visit: Payer: PPO | Admitting: Urology

## 2019-10-05 ENCOUNTER — Other Ambulatory Visit: Payer: Self-pay

## 2019-10-05 VITALS — BP 121/74 | HR 67 | Ht 68.0 in | Wt 252.0 lb

## 2019-10-05 DIAGNOSIS — R338 Other retention of urine: Secondary | ICD-10-CM | POA: Diagnosis not present

## 2019-10-05 DIAGNOSIS — N401 Enlarged prostate with lower urinary tract symptoms: Secondary | ICD-10-CM

## 2019-10-05 DIAGNOSIS — N453 Epididymo-orchitis: Secondary | ICD-10-CM

## 2019-10-05 NOTE — Progress Notes (Signed)
10/05/2019 10:54 AM   Derrick Sandoval. 02-19-1956 [id number]  Referring provider: Valerie Roys, DO [place],  [place] [address]  Chief Complaint  Patient presents with  . Groin Swelling    new patient    HPI: 64 year old male with a personal history of urinary retention who presents today with scrotal pain.  He was last seen in our office in 2019 by Baruch Gouty but previously followed by Dr. Jacqlyn Larsen.  Per surgery of TURP and urethral stricture disease.  He is on maximal medical management, Flomax and finasteride.  He underwent cystoscopy in 2019 indicating no urethral stricture disease with a large TURP defect.  He was advised to self cath for incomplete bladder emptying but refused.  He also was recommended to have urodynamics but not pursue this.  Since then, he is transferred his care to [facility] urology where he is now managed with self cath 3 times a day in addition to voiding.  He was seen by his primary care physician starting Monday for fevers to 101 degrees.  On Tuesday, he had an ED visit complaining of pain with self cath he was started on Bactrim.  This was done via virtual visit and no urine was obtained.  He returned to his primary care the following day after he developed right testicular pain and swelling which he initially attributed to the Bactrim itself. Urine was grossly positive with urine culture pending.  He was started on doxycycline.    He reports that the testicular pain and swelling has improved slightly.  He no longer has nausea or vomiting.  His last fever was yesterday, 100.3.  Overall he is now able to tolerate p.o.  He feels like he is going in the right direction.  He has been self cathing 3 times a day.  Most recent PSA 12/20 0.1.   PMH: Past Medical History:  Diagnosis Date  . Allergy   . Arthritis   . Cough    nonproductive no fever saw at dr 05-26-18  . GERD (gastroesophageal reflux disease)   . Hyperlipidemia   .  Hypertension   . Meningitis due to viruses 1968    Surgical History: Past Surgical History:  Procedure Laterality Date  . COLONOSCOPY WITH PROPOFOL N/A 05/12/2018   Procedure: COLONOSCOPY WITH PROPOFOL;  Surgeon: Jonathon Bellows, MD;  Location: [facility] ENDOSCOPY;  Service: Gastroenterology;  Laterality: N/A;  . ESOPHAGOGASTRODUODENOSCOPY (EGD) WITH PROPOFOL N/A 05/12/2018   Procedure: ESOPHAGOGASTRODUODENOSCOPY (EGD) WITH PROPOFOL;  Surgeon: Jonathon Bellows, MD;  Location: [facility] ENDOSCOPY;  Service: Gastroenterology;  Laterality: N/A;  . ESOPHAGOGASTRODUODENOSCOPY (EGD) WITH PROPOFOL N/A 06/01/2018   Procedure: ESOPHAGOGASTRODUODENOSCOPY (EGD) WITH PROPOFOL;  Surgeon: Milus Banister, MD;  Location: [facility] ENDOSCOPY;  Service: Endoscopy;  Laterality: N/A;  . EUS N/A 06/01/2018   Procedure: UPPER ENDOSCOPIC ULTRASOUND (EUS) RADIAL;  Surgeon: Milus Banister, MD;  Location: [facility] ENDOSCOPY;  Service: Endoscopy;  Laterality: N/A;  . JOINT REPLACEMENT Bilateral   . NASAL SINUS SURGERY    . TOTAL KNEE ARTHROPLASTY      Home Medications:  Allergies as of 10/05/2019   No Known Allergies     Medication List       Accurate as of October 05, 2019 10:54 AM. If you have any questions, ask your nurse or doctor.        STOP taking these medications   cyclobenzaprine 5 MG tablet Commonly known as: FLEXERIL Stopped by: Hollice Espy, MD   sulfamethoxazole-trimethoprim 800-160  MG tablet Commonly known as: BACTRIM DS Stopped by: Hollice Espy, MD   SUMAtriptan 25 MG tablet Commonly known as: Imitrex Stopped by: Hollice Espy, MD   tamsulosin 0.4 MG Caps capsule Commonly known as: FLOMAX Stopped by: Hollice Espy, MD     TAKE these medications   acyclovir 400 MG tablet Commonly known as: ZOVIRAX Take 1 tablet (400 mg total) by mouth 2 (two) times daily.   albuterol 108 (90 Base) MCG/ACT inhaler Commonly known as: VENTOLIN HFA Inhale 1-2 puffs into the lungs every 6 (six) hours as needed for  wheezing or shortness of breath.   aspirin EC 81 MG tablet Take 81 mg by mouth daily.   cholecalciferol 1000 units tablet Commonly known as: VITAMIN D Take 1,000 Units by mouth daily.   doxycycline 100 MG tablet Commonly known as: VIBRA-TABS Take 1 tablet (100 mg total) by mouth 2 (two) times daily.   famotidine 20 MG tablet Commonly known as: PEPCID Take 1 tablet (20 mg total) by mouth 2 (two) times daily.   fluticasone 50 MCG/ACT nasal spray Commonly known as: FLONASE Place 1 spray into both nostrils 2 (two) times daily.   hydrOXYzine 50 MG tablet Commonly known as: ATARAX/VISTARIL Take 1-2 tablets (50-100 mg total) by mouth 3 (three) times daily as needed.   lisinopril 20 MG tablet Commonly known as: ZESTRIL Take 1 tablet (20 mg total) by mouth daily.   loratadine 10 MG tablet Commonly known as: CLARITIN Take 1 tablet (10 mg total) by mouth daily.   montelukast 10 MG tablet Commonly known as: SINGULAIR Take 1 tablet (10 mg total) by mouth at bedtime.   multivitamin with minerals tablet Take 1 tablet by mouth daily.   nystatin cream Commonly known as: MYCOSTATIN Apply 1 application topically 2 (two) times daily.   omeprazole 40 MG capsule Commonly known as: PRILOSEC Take 1 capsule (40 mg total) by mouth daily.   tadalafil 5 MG tablet Commonly known as: Cialis Take 1 tablet (5 mg total) by mouth daily as needed for erectile dysfunction.   triamcinolone ointment 0.1 % Commonly known as: KENALOG Apply 1 application topically 2 (two) times daily.   vitamin C 1000 MG tablet Take 1,000 mg by mouth at bedtime.       Allergies: No Known Allergies  Family History: Family History  Problem Relation Age of Onset  . Breast cancer Mother   . Cancer Father        unsure, passed away before it was confirmed  . Pancreatic cancer Daughter   . Diabetes Neg Hx   . Heart disease Neg Hx   . Hypertension Neg Hx   . Stroke Neg Hx   . COPD Neg Hx   . Colon cancer  Neg Hx     Social History:  reports that he has never smoked. He has never used smokeless tobacco. He reports previous alcohol use. He reports that he does not use drugs.   Physical Exam: BP 121/74   Pulse 67   Ht 5\' 8"  (1.727 m)   Wt 252 lb (114.3 kg)   BMI 38.32 kg/m   Constitutional:  Alert and oriented, No acute distress. HEENT: [place] AT, moist mucus membranes.  Trachea midline, no masses. Cardiovascular: No clubbing, cyanosis, or edema. Respiratory: Normal respiratory effort, no increased work of breathing. GI: Abdomen is soft, nontender, nondistended, no abdominal masses, obese GU: Normal phallus with orthotopic meatus.  Right testicle baseball sized, enlarged, firm and mildly tender.  No significant scrotal  edema, fluctuance, or skin changes appreciated. Skin: No rashes, bruises or suspicious lesions. Neurologic: Grossly intact, no focal deficits, moving all 4 extremities. Psychiatric: Normal mood and affect.   Assessment & Plan:    1. Orchitis and epididymitis Right epididymoorchitis, likely catheter related  UA grossly positive, culture pending  Given these improving on doxycycline, would continue this medication.  We discussed that epididymoorchitis often takes 48 to 72 hours of antibiotics to really see improvement.  He is counseled on supportive care including tight underwear and NSAIDs to help with inflammation.  If his symptoms worsen over the weekend, I recommended changing back to Bactrim, otherwise stay the course with doxycycline.  Antibiotics to be adjusted based on culture and sensitivity data.  2. Benign prostatic hyperplasia with urinary retention Managed by [facility] urology on self cath 3 times a day  We discussed that it would be best if he is managed by a single urologist rather than bouncing between practices.  Were happy to accommodate him here if that is his preference in the future but otherwise I recommended he return back to [facility] urology for further  care/evaluation.   Hollice Espy, MD  [facility] [address] [place], [place] [address] [phone]

## 2019-10-07 LAB — UA/M W/RFLX CULTURE, ROUTINE
Bilirubin, UA: NEGATIVE
Glucose, UA: NEGATIVE
Nitrite, UA: POSITIVE — AB
RBC, UA: NEGATIVE
Specific Gravity, UA: >1.03 — ABNORMAL HIGH (ref 1.005–1.030)
Urobilinogen, Ur: 1 mg/dL (ref 0.2–1.0)
pH, UA: 5.5 (ref 5.0–7.5)

## 2019-10-07 LAB — MICROSCOPIC EXAMINATION
RBC, Urine: NONE SEEN /hpf (ref 0–2)
WBC, UA: >30 /hpf — AB (ref 0–5)

## 2019-10-07 LAB — URINE CULTURE, REFLEX

## 2019-10-08 ENCOUNTER — Encounter: Payer: Self-pay | Admitting: Urology

## 2019-10-09 ENCOUNTER — Encounter: Payer: Self-pay | Admitting: Urology

## 2019-10-09 ENCOUNTER — Encounter: Payer: Self-pay | Admitting: Family Medicine

## 2019-10-09 NOTE — Telephone Encounter (Signed)
Was seen by you for this issue.

## 2019-10-10 NOTE — Telephone Encounter (Signed)
Called patient to discuss symptoms-has been improving, still having some swelling. He has completed Doxycycline. Advised to continue monitor swelling and pain. Call if symptoms worsen.

## 2019-10-11 ENCOUNTER — Encounter: Payer: Self-pay | Admitting: Family Medicine

## 2019-10-12 ENCOUNTER — Other Ambulatory Visit: Payer: Self-pay | Admitting: Gastroenterology

## 2019-10-12 ENCOUNTER — Encounter: Payer: Self-pay | Admitting: Family Medicine

## 2019-10-15 ENCOUNTER — Encounter: Payer: Self-pay | Admitting: Urology

## 2019-10-17 ENCOUNTER — Encounter: Payer: Self-pay | Admitting: Family Medicine

## 2019-10-17 ENCOUNTER — Telehealth: Payer: Self-pay | Admitting: *Deleted

## 2019-10-17 ENCOUNTER — Encounter: Payer: Self-pay | Admitting: Urology

## 2019-10-17 NOTE — Telephone Encounter (Signed)
See my chart encounter.

## 2019-10-17 NOTE — Telephone Encounter (Signed)
Spoke with patient, offered an appointment to be re-evaluated, no improvement in swelling.  Appointment scheduled for follow up in one year and eval in our office on 10/19/19

## 2019-10-19 ENCOUNTER — Ambulatory Visit (INDEPENDENT_AMBULATORY_CARE_PROVIDER_SITE_OTHER): Payer: PPO | Admitting: Physician Assistant

## 2019-10-19 ENCOUNTER — Encounter: Payer: Self-pay | Admitting: Physician Assistant

## 2019-10-19 ENCOUNTER — Other Ambulatory Visit: Payer: Self-pay

## 2019-10-19 VITALS — BP 147/91 | HR 72 | Ht 72.0 in | Wt 252.0 lb

## 2019-10-19 DIAGNOSIS — N453 Epididymo-orchitis: Secondary | ICD-10-CM | POA: Diagnosis not present

## 2019-10-19 DIAGNOSIS — R339 Retention of urine, unspecified: Secondary | ICD-10-CM | POA: Diagnosis not present

## 2019-10-19 LAB — URINALYSIS, COMPLETE
Bilirubin, UA: NEGATIVE
Glucose, UA: NEGATIVE
Ketones, UA: NEGATIVE
Leukocytes,UA: NEGATIVE
Nitrite, UA: NEGATIVE
Protein,UA: NEGATIVE
RBC, UA: NEGATIVE
Specific Gravity, UA: 1.02 (ref 1.005–1.030)
Urobilinogen, Ur: 0.2 mg/dL (ref 0.2–1.0)
pH, UA: 5.5 (ref 5.0–7.5)

## 2019-10-19 LAB — MICROSCOPIC EXAMINATION
Bacteria, UA: NONE SEEN
RBC, Urine: NONE SEEN /hpf (ref 0–2)

## 2019-10-19 NOTE — Patient Instructions (Signed)
Continue wearing compression underwear, applying ice as needed (with a towel or sheet between the ice and your skin), and elevate your scrotum with a rolled up washcloth or towel when you're seated. The swelling will continue to resolve.

## 2019-10-19 NOTE — Progress Notes (Signed)
10/19/2019 10:31 AM   Derrick Sandoval. May 31, 1956 [id number]  CC: Persistent scrotal edema  HPI: Derrick Sandoval. is a 64 y.o. male with PMH urinary retention, BPH s/p TURP on Flomax and finasteride, and remote history of urethral stricture disease with resolution on cystoscopy in 2019.  He CICs 3 times daily in addition to voiding.  He was last seen in clinic by Dr. Erlene Quan on 10/05/2019 with reports of recent fever and right testicular pain and swelling.  He was diagnosed with right epididymoorchitis and counseled to continue previously prescribed doxycycline due to his reports of symptomatic relief.  Today, he returns to clinic for reevaluation.  He states that his right testicular pain and difficulty urinating have resolved since completing his course of doxycycline last week.  He notes some persistent, however improved, right hemiscrotal edema and states that he wanted to get rechecked.  He is still CICing 3 times daily as well as voiding.  Patient was previously being managed by both our clinic and [facility].  Dr. Erlene Quan encouraged him to continue care with one team only.  He elected to remain under our care, however today he reports interest in pursuing an unknown treatment for his incomplete emptying, possibly some form of bladder pacemaker, described to him by [name] at the time that he underwent urodynamics with her on 06/05/2018.  Patient states that this treatment was anticipated to become available around summer 2020.  In-office UA and microscopy today pan-negative.  PMH: Past Medical History:  Diagnosis Date  . Allergy   . Arthritis   . Cough    nonproductive no fever saw at dr 05-26-18  . GERD (gastroesophageal reflux disease)   . Hyperlipidemia   . Hypertension   . Meningitis due to viruses 1968    Surgical History: Past Surgical History:  Procedure Laterality Date  . COLONOSCOPY WITH PROPOFOL N/A 05/12/2018   Procedure: COLONOSCOPY WITH  PROPOFOL;  Surgeon: Jonathon Bellows, MD;  Location: [facility] ENDOSCOPY;  Service: Gastroenterology;  Laterality: N/A;  . ESOPHAGOGASTRODUODENOSCOPY (EGD) WITH PROPOFOL N/A 05/12/2018   Procedure: ESOPHAGOGASTRODUODENOSCOPY (EGD) WITH PROPOFOL;  Surgeon: Jonathon Bellows, MD;  Location: [facility] ENDOSCOPY;  Service: Gastroenterology;  Laterality: N/A;  . ESOPHAGOGASTRODUODENOSCOPY (EGD) WITH PROPOFOL N/A 06/01/2018   Procedure: ESOPHAGOGASTRODUODENOSCOPY (EGD) WITH PROPOFOL;  Surgeon: Milus Banister, MD;  Location: [facility] ENDOSCOPY;  Service: Endoscopy;  Laterality: N/A;  . EUS N/A 06/01/2018   Procedure: UPPER ENDOSCOPIC ULTRASOUND (EUS) RADIAL;  Surgeon: Milus Banister, MD;  Location: [facility] ENDOSCOPY;  Service: Endoscopy;  Laterality: N/A;  . JOINT REPLACEMENT Bilateral   . NASAL SINUS SURGERY    . TOTAL KNEE ARTHROPLASTY      Home Medications:  Allergies as of 10/19/2019   No Known Allergies     Medication List       Accurate as of October 19, 2019 10:31 AM. If you have any questions, ask your nurse or doctor.        acyclovir 400 MG tablet Commonly known as: ZOVIRAX Take 1 tablet (400 mg total) by mouth 2 (two) times daily.   albuterol 108 (90 Base) MCG/ACT inhaler Commonly known as: VENTOLIN HFA Inhale 1-2 puffs into the lungs every 6 (six) hours as needed for wheezing or shortness of breath.   aspirin EC 81 MG tablet Take 81 mg by mouth daily.   cholecalciferol 1000 units tablet Commonly known as: VITAMIN D Take 1,000 Units by mouth daily.   doxycycline 100 MG tablet Commonly known as:  VIBRA-TABS Take 1 tablet (100 mg total) by mouth 2 (two) times daily.   famotidine 20 MG tablet Commonly known as: PEPCID Take 1 tablet (20 mg total) by mouth 2 (two) times daily.   fluticasone 50 MCG/ACT nasal spray Commonly known as: FLONASE Place 1 spray into both nostrils 2 (two) times daily.   hydrOXYzine 50 MG tablet Commonly known as: ATARAX/VISTARIL Take 1-2 tablets (50-100 mg total) by mouth 3  (three) times daily as needed.   lisinopril 20 MG tablet Commonly known as: ZESTRIL Take 1 tablet (20 mg total) by mouth daily.   loratadine 10 MG tablet Commonly known as: CLARITIN Take 1 tablet (10 mg total) by mouth daily.   montelukast 10 MG tablet Commonly known as: SINGULAIR Take 1 tablet (10 mg total) by mouth at bedtime.   multivitamin with minerals tablet Take 1 tablet by mouth daily.   nystatin cream Commonly known as: MYCOSTATIN Apply 1 application topically 2 (two) times daily.   omeprazole 40 MG capsule Commonly known as: PRILOSEC Take 1 capsule (40 mg total) by mouth daily.   tadalafil 5 MG tablet Commonly known as: Cialis Take 1 tablet (5 mg total) by mouth daily as needed for erectile dysfunction.   triamcinolone ointment 0.1 % Commonly known as: KENALOG Apply 1 application topically 2 (two) times daily.   vitamin C 1000 MG tablet Take 1,000 mg by mouth at bedtime.       Allergies:  No Known Allergies  Family History: Family History  Problem Relation Age of Onset  . Breast cancer Mother   . Cancer Father        unsure, passed away before it was confirmed  . Pancreatic cancer Daughter   . Diabetes Neg Hx   . Heart disease Neg Hx   . Hypertension Neg Hx   . Stroke Neg Hx   . COPD Neg Hx   . Colon cancer Neg Hx     Social History:   reports that he has never smoked. He has never used smokeless tobacco. He reports previous alcohol use. He reports that he does not use drugs.  Physical Exam: BP (!) 147/91   Pulse 72   Ht 6' (1.829 m)   Wt 252 lb (114.3 kg)   BMI 34.18 kg/m   Constitutional:  Alert and oriented, no acute distress, nontoxic appearing HEENT: [place], AT Cardiovascular: No clubbing, cyanosis, or edema Respiratory: Normal respiratory effort, no increased work of breathing GU: Bilateral descended testicles.  Vasa intact.  Mild, diffuse fullness of the right hemiscrotum and testicle without fluctuance, erythema, tenderness, or  warmth. Skin: No rashes, bruises or suspicious lesions Neurologic: Grossly intact, no focal deficits, moving all 4 extremities Psychiatric: Normal mood and affect  Laboratory Data: Results for orders placed or performed in visit on 10/19/19  Microscopic Examination   URINE  Result Value Ref Range   WBC, UA 0-5 0 - 5 /hpf   RBC None seen 0 - 2 /hpf   Epithelial Cells (non renal) 0-10 0 - 10 /hpf   Bacteria, UA None seen None seen/Few  Urinalysis, Complete  Result Value Ref Range   Specific Gravity, UA 1.020 1.005 - 1.030   pH, UA 5.5 5.0 - 7.5   Color, UA Yellow Yellow   Appearance Ur Clear Clear   Leukocytes,UA Negative Negative   Protein,UA Negative Negative/Trace   Glucose, UA Negative Negative   Ketones, UA Negative Negative   RBC, UA Negative Negative   Bilirubin, UA Negative Negative  Urobilinogen, Ur 0.2 0.2 - 1.0 mg/dL   Nitrite, UA Negative Negative   Microscopic Examination See below:    Assessment & Plan:   1. Orchitis and epididymitis Resolved with some persistent right hemiscrotal edema, UA reassuring.  Counseled patient that this would continue to resolve on its own in the coming weeks.  Counseled him to continue wearing compressive underwear, use cryotherapy as needed, and elevate the scrotum to promote drainage.  He expressed understanding. - Urinalysis, Complete  2. Incomplete bladder emptying I explained to the patient today that I do not know what treatment he is referring to that he would like to pursue.  Upon chart review, there is not a specific mention of this in his urodynamics report.  I explained to the patient that we are not currently offering new treatment modalities for incomplete bladder emptying at our practice.  I advised him that if he is interested in pursuing this treatment, I encouraged him to select [facility] as his urology team as they are the ones who first mentioned it.  He is somewhat resistant to this, stating that it takes too long for  him to get in to see his providers at [facility].  I reiterated that he may be able to gain access to treatments at [facility] that we do not provide at this practice.  Return if symptoms worsen or fail to improve.  Debroah Loop, PA-C  [facility] [address] [place], [place] [address] [phone]

## 2019-10-21 ENCOUNTER — Other Ambulatory Visit: Payer: Self-pay | Admitting: Family Medicine

## 2019-10-21 MED ORDER — LUBIPROSTONE 8 MCG PO CAPS: 8.0000 ug | ORAL_CAPSULE | Freq: Two times a day (BID) | ORAL | 0 refills | Status: DC

## 2019-10-23 ENCOUNTER — Encounter: Payer: Self-pay | Admitting: Family Medicine

## 2019-10-23 MED ORDER — TADALAFIL 5 MG PO TABS: 5.0000 mg | ORAL_TABLET | Freq: Every day | ORAL | 0 refills | Status: DC | PRN

## 2019-10-26 ENCOUNTER — Encounter: Payer: Self-pay | Admitting: Family Medicine

## 2019-10-30 ENCOUNTER — Encounter: Payer: Self-pay | Admitting: Family Medicine

## 2019-10-30 ENCOUNTER — Encounter: Payer: Self-pay | Admitting: Urology

## 2019-10-30 DIAGNOSIS — R399 Unspecified symptoms and signs involving the genitourinary system: Secondary | ICD-10-CM | POA: Diagnosis not present

## 2019-10-30 DIAGNOSIS — N401 Enlarged prostate with lower urinary tract symptoms: Secondary | ICD-10-CM | POA: Diagnosis not present

## 2019-10-31 ENCOUNTER — Ambulatory Visit: Payer: Self-pay | Admitting: General Practice

## 2019-10-31 ENCOUNTER — Telehealth: Payer: PPO

## 2019-10-31 NOTE — Chronic Care Management (AMB) (Signed)
  Chronic Care Management   Outreach Note  10/31/2019 Name: Derrick Sandoval. MRN: [id number] DOB: [birth year]  Referred by: Valerie Roys, DO Reason for referral : Chronic Care Management (Initial outreach: HTN/HLD- 1st attempt)   An unsuccessful telephone outreach was attempted today. The patient was referred to the case management team for assistance with care management and care coordination. The patient answered but then hung up. The patient sent a text message saying he could not talk right now. He agreed to be contacted at another time.   Follow Up Plan: The care management team will reach out to the patient again over the next 30 to 60 days.   Noreene Larsson RN, MSN, [facility] Mobile: [phone]

## 2019-11-04 ENCOUNTER — Encounter: Payer: Self-pay | Admitting: Family Medicine

## 2019-11-05 ENCOUNTER — Encounter: Payer: Self-pay | Admitting: Family Medicine

## 2019-11-07 ENCOUNTER — Encounter: Payer: Self-pay | Admitting: Family Medicine

## 2019-11-09 ENCOUNTER — Ambulatory Visit: Payer: PPO | Attending: Internal Medicine

## 2019-11-09 DIAGNOSIS — Z23 Encounter for immunization: Secondary | ICD-10-CM

## 2019-11-09 NOTE — Progress Notes (Signed)
   [id number] Vaccination Clinic  Name:  Derrick Sandoval.    MRN: [id number] DOB: Jan 26, 1956  11/09/2019  [name] was observed post Covid-19 immunization for 15 minutes without incident. He was provided with Vaccine Information Sheet and instruction to access the V-Safe system.   [name] was instructed to call 911 with any severe reactions post vaccine: Marland Kitchen Difficulty breathing  . Swelling of face and throat  . A fast heartbeat  . A bad rash all over body  . Dizziness and weakness   Immunizations Administered    Name Date Dose VIS Date Route   Pfizer COVID-19 Vaccine 11/09/2019 12:50 PM 0.3 mL 07/27/2019 Intramuscular   Manufacturer: [place]   Lot: [id number]   [place]: [id number]

## 2019-11-30 ENCOUNTER — Ambulatory Visit: Payer: PPO | Attending: Internal Medicine

## 2019-11-30 DIAGNOSIS — Z23 Encounter for immunization: Secondary | ICD-10-CM

## 2019-11-30 NOTE — Progress Notes (Signed)
   [id number] Vaccination Clinic  Name:  Derrick Sandoval.    MRN: [id number] DOB: [birth year]  11/30/2019  Derrick Sandoval was observed post Covid-19 immunization for 15 minutes without incident. He was provided with Vaccine Information Sheet and instruction to access the V-Safe system.   Derrick Sandoval was instructed to call 911 with any severe reactions post vaccine: Marland Kitchen Difficulty breathing  . Swelling of face and throat  . A fast heartbeat  . A bad rash all over body  . Dizziness and weakness   Immunizations Administered    Name Date Dose VIS Date Route   Pfizer COVID-19 Vaccine 11/30/2019 12:17 PM 0.3 mL 07/27/2019 Intramuscular   Manufacturer: [place]   Lot: [id number]   [place]: [id number]

## 2019-12-10 ENCOUNTER — Other Ambulatory Visit: Payer: Self-pay | Admitting: Family Medicine

## 2019-12-14 ENCOUNTER — Telehealth: Payer: PPO

## 2019-12-14 ENCOUNTER — Ambulatory Visit: Payer: Self-pay | Admitting: General Practice

## 2019-12-14 DIAGNOSIS — R339 Retention of urine, unspecified: Secondary | ICD-10-CM | POA: Diagnosis not present

## 2019-12-14 DIAGNOSIS — N401 Enlarged prostate with lower urinary tract symptoms: Secondary | ICD-10-CM | POA: Diagnosis not present

## 2019-12-14 DIAGNOSIS — N35919 Unspecified urethral stricture, male, unspecified site: Secondary | ICD-10-CM | POA: Diagnosis not present

## 2019-12-14 DIAGNOSIS — N319 Neuromuscular dysfunction of bladder, unspecified: Secondary | ICD-10-CM | POA: Diagnosis not present

## 2019-12-14 NOTE — Chronic Care Management (AMB) (Signed)
  Chronic Care Management   Outreach Note  12/14/2019 Name: Derrick Sandoval. MRN: [id number] DOB: 08-29-1955  Referred by: Valerie Roys, DO Reason for referral : Chronic Care Management (Initial outreach: 2nd attempt- the patient is not interest )   A second unsuccessful telephone outreach was attempted today. The patient was referred to the case management team for assistance with care management and care coordination. The patient answered the phone and stated he is not interested in talking to the [facility] after the RNCM identified self.  Closing referral.   Follow Up Plan: The care management team is available to follow up with the patient after provider conversation with the patient regarding recommendation for care management engagement and subsequent re-referral to the care management team.   Noreene Larsson RN, MSN, [facility] Mobile: [phone]

## 2019-12-28 ENCOUNTER — Encounter: Payer: Self-pay | Admitting: Family Medicine

## 2019-12-28 ENCOUNTER — Other Ambulatory Visit: Payer: Self-pay | Admitting: Family Medicine

## 2020-01-14 ENCOUNTER — Encounter: Payer: Self-pay | Admitting: Family Medicine

## 2020-01-21 ENCOUNTER — Encounter: Payer: Self-pay | Admitting: Family Medicine

## 2020-01-22 ENCOUNTER — Encounter: Payer: Self-pay | Admitting: Family Medicine

## 2020-01-22 ENCOUNTER — Telehealth (INDEPENDENT_AMBULATORY_CARE_PROVIDER_SITE_OTHER): Payer: PPO | Admitting: Family Medicine

## 2020-01-22 VITALS — BP 140/101 | HR 66 | Temp 98.6°F

## 2020-01-22 DIAGNOSIS — J069 Acute upper respiratory infection, unspecified: Secondary | ICD-10-CM | POA: Diagnosis not present

## 2020-01-22 MED ORDER — PREDNISONE 50 MG PO TABS: 50.0000 mg | ORAL_TABLET | Freq: Every day | ORAL | 0 refills | Status: DC

## 2020-01-22 MED ORDER — AZITHROMYCIN 250 MG PO TABS: ORAL_TABLET | ORAL | 0 refills | Status: DC

## 2020-01-22 NOTE — Progress Notes (Signed)
BP (!) 140/101   Pulse 66   Temp 98.6 F (37 C)    Subjective:    Patient ID: Derrick Ball., male    DOB: Jul 22, 1956, 64 y.o.   MRN: [id number]  HPI: Derrick Umholtz. is a 64 y.o. male  Chief Complaint  Patient presents with  . Cough    X 4 days, ears clogged and hurting. Started being hoarse about 4 weeks ago   UPPER RESPIRATORY TRACT INFECTION Duration: 4 weeks, worse about 5 days  Worst symptom: hoarse and coughing, very fatigued Fever: no Cough: yes Shortness of breath: yes Wheezing: yes Chest pain: yes, with cough Chest tightness: yes Chest congestion: yes Nasal congestion: no Runny nose: no Post nasal drip: no Sneezing: yes Sore throat: yes Swollen glands: no Sinus pressure: no Headache: no Face pain: no Toothache: no Ear pain: yes bilateral Ear pressure: yes bilateral Eyes red/itching:yes Eye drainage/crusting: no  Vomiting: no Rash: no Fatigue: yes Sick contacts: no Strep contacts: no  Context: stable Recurrent sinusitis: yes Relief with OTC cold/cough medications: no  Treatments attempted: anti-histamine and pseudoephedrine   Relevant past medical, surgical, family and social history reviewed and updated as indicated. Interim medical history since our last visit reviewed. Allergies and medications reviewed and updated.  Review of Systems  Constitutional: Positive for fatigue. Negative for activity change, appetite change, chills, diaphoresis, fever and unexpected weight change.  HENT: Positive for congestion, ear pain and sore throat. Negative for dental problem, drooling, ear discharge, facial swelling, hearing loss, mouth sores, nosebleeds, postnasal drip, rhinorrhea, sinus pressure, sinus pain, sneezing, tinnitus, trouble swallowing and voice change.   Eyes: Negative.   Respiratory: Positive for cough and chest tightness. Negative for apnea, choking, shortness of breath, wheezing and stridor.   Cardiovascular: Negative.     Gastrointestinal: Negative.   Psychiatric/Behavioral: Negative.     Per HPI unless specifically indicated above     Objective:    BP (!) 140/101   Pulse 66   Temp 98.6 F (37 C)   Wt Readings from Last 3 Encounters:  10/19/19 252 lb (114.3 kg)  10/05/19 252 lb (114.3 kg)  10/03/19 252 lb (114.3 kg)    Physical Exam Vitals and nursing note reviewed.  Constitutional:      General: He is not in acute distress.    Appearance: Normal appearance. He is not ill-appearing, toxic-appearing or diaphoretic.  HENT:     Head: Normocephalic and atraumatic.     Right Ear: External ear normal.     Left Ear: External ear normal.     Nose: Nose normal.     Mouth/Throat:     Mouth: Mucous membranes are moist.     Pharynx: Oropharynx is clear.  Eyes:     General: No scleral icterus.       Right eye: No discharge.        Left eye: No discharge.     Conjunctiva/sclera: Conjunctivae normal.     Pupils: Pupils are equal, round, and reactive to light.  Pulmonary:     Effort: Pulmonary effort is normal. No respiratory distress.     Comments: Speaking in full sentences Musculoskeletal:        General: Normal range of motion.     Cervical back: Normal range of motion.  Skin:    Coloration: Skin is not jaundiced or pale.     Findings: No bruising, erythema, lesion or rash.  Neurological:     Mental Status: He is  alert and oriented to person, place, and time. Mental status is at baseline.  Psychiatric:        Mood and Affect: Mood normal.        Behavior: Behavior normal.        Thought Content: Thought content normal.        Judgment: Judgment normal.     Results for orders placed or performed in visit on 10/19/19  Microscopic Examination   URINE  Result Value Ref Range   WBC, UA 0-5 0 - 5 /hpf   RBC None seen 0 - 2 /hpf   Epithelial Cells (non renal) 0-10 0 - 10 /hpf   Bacteria, UA None seen None seen/Few  Urinalysis, Complete  Result Value Ref Range   Specific Gravity, UA  1.020 1.005 - 1.030   pH, UA 5.5 5.0 - 7.5   Color, UA Yellow Yellow   Appearance Ur Clear Clear   Leukocytes,UA Negative Negative   Protein,UA Negative Negative/Trace   Glucose, UA Negative Negative   Ketones, UA Negative Negative   RBC, UA Negative Negative   Bilirubin, UA Negative Negative   Urobilinogen, Ur 0.2 0.2 - 1.0 mg/dL   Nitrite, UA Negative Negative   Microscopic Examination See below:       Assessment & Plan:   Problem List Items Addressed This Visit    None    Visit Diagnoses    Upper respiratory tract infection, unspecified type    -  Primary   Will treat with burst of prednisone and azithromycin. Call if not getting better or getting worst. If not better by end of the week- get COVID swabbed.    Relevant Medications   azithromycin (ZITHROMAX) 250 MG tablet       Follow up plan: Return if symptoms worsen or fail to improve.    . This visit was completed via MyChart due to the restrictions of the COVID-19 pandemic. All issues as above were discussed and addressed. Physical exam was done as above through visual confirmation on MyChart. If it was felt that the patient should be evaluated in the office, they were directed there. The patient verbally consented to this visit. . Location of the patient: home . Location of the provider: work . Those involved with this call:  . Provider: Park Liter, DO . CMA: Tiffany Reel, CMA . Front Desk/Registration: Don Perking  . Time spent on call: 15 minutes with patient face to face via video conference. More than 50% of this time was spent in counseling and coordination of care. 23 minutes total spent in review of patient's record and preparation of their chart.

## 2020-01-22 NOTE — Patient Instructions (Signed)
To schedule a COVID test, please  text "COVID" to [phone], OR you can log on to [email or web address] to easily make an on-line appointment. If you do not have access to a smart phone or PC, you can call [phone] to get assistance.  

## 2020-02-05 ENCOUNTER — Encounter: Payer: Self-pay | Admitting: Family Medicine

## 2020-02-05 MED ORDER — FLUTICASONE PROPIONATE 50 MCG/ACT NA SUSP: 1.0000 | Freq: Two times a day (BID) | NASAL | 12 refills | Status: DC

## 2020-02-21 ENCOUNTER — Encounter: Payer: Self-pay | Admitting: Family Medicine

## 2020-02-22 ENCOUNTER — Other Ambulatory Visit: Payer: Self-pay | Admitting: Family Medicine

## 2020-02-22 MED ORDER — TADALAFIL 5 MG PO TABS: 5.0000 mg | ORAL_TABLET | Freq: Every day | ORAL | 0 refills | Status: DC | PRN

## 2020-02-28 DIAGNOSIS — Z6841 Body Mass Index (BMI) 40.0 and over, adult: Secondary | ICD-10-CM | POA: Diagnosis not present

## 2020-02-28 DIAGNOSIS — R339 Retention of urine, unspecified: Secondary | ICD-10-CM | POA: Diagnosis not present

## 2020-02-29 ENCOUNTER — Other Ambulatory Visit: Payer: Self-pay | Admitting: Family Medicine

## 2020-03-06 ENCOUNTER — Encounter: Payer: Self-pay | Admitting: Family Medicine

## 2020-03-07 ENCOUNTER — Encounter: Payer: Self-pay | Admitting: Family Medicine

## 2020-03-07 ENCOUNTER — Telehealth (INDEPENDENT_AMBULATORY_CARE_PROVIDER_SITE_OTHER): Payer: PPO | Admitting: Family Medicine

## 2020-03-07 VITALS — BP 145/95 | HR 67 | Temp 97.6°F

## 2020-03-07 DIAGNOSIS — J301 Allergic rhinitis due to pollen: Secondary | ICD-10-CM | POA: Diagnosis not present

## 2020-03-07 MED ORDER — PREDNISONE 10 MG PO TABS: ORAL_TABLET | ORAL | 0 refills | Status: DC

## 2020-03-07 NOTE — Progress Notes (Signed)
BP (!) 145/95   Pulse 67   Temp 97.6 F (36.4 C) (Oral)    Subjective:    Patient ID: Derrick Ball., male    DOB: [birth year], 64 y.o.   MRN: [id number]  HPI: Derrick Causby. is a 64 y.o. male  Chief Complaint  Patient presents with  . URI    pt states he has been having a headache, congestion, runny nose, fatigue and ear pain for the last few days. States he has tried OTC allergy meds with no relief   UPPER RESPIRATORY TRACT INFECTION- had to mow the lawn 2x and has his allergies acting up for about a week Worst symptom: congestion and headache Fever: no Cough: yes Shortness of breath: no Wheezing: no Chest pain: no Chest tightness: no Chest congestion: no Nasal congestion: yes Runny nose: no Post nasal drip: yes Sneezing: yes Sore throat: no Swollen glands: no Sinus pressure: yes Headache: yes Face pain: yes Toothache: no Ear pain: yes "right Ear pressure: yes "right Eyes red/itching: yes Eye drainage/crusting: no  Vomiting: no Rash: no Fatigue: yes Sick contacts: no Strep contacts: no  Context: stable Recurrent sinusitis: yes Relief with OTC cold/cough medications: no  Treatments attempted: mucinex, anti-histamine and pseudoephedrine    Relevant past medical, surgical, family and social history reviewed and updated as indicated. Interim medical history since our last visit reviewed. Allergies and medications reviewed and updated.  Review of Systems  Constitutional: Positive for fatigue. Negative for activity change, appetite change, chills, diaphoresis, fever and unexpected weight change.  HENT: Positive for congestion, ear pain, postnasal drip, rhinorrhea, sinus pressure, sinus pain, sneezing and sore throat. Negative for dental problem, drooling, ear discharge, facial swelling, hearing loss, mouth sores, nosebleeds, tinnitus, trouble swallowing and voice change.   Eyes: Negative.   Respiratory: Negative.   Cardiovascular: Negative.     Gastrointestinal: Negative.   Psychiatric/Behavioral: Negative.     Per HPI unless specifically indicated above     Objective:    BP (!) 145/95   Pulse 67   Temp 97.6 F (36.4 C) (Oral)   Wt Readings from Last 3 Encounters:  10/19/19 252 lb (114.3 kg)  10/05/19 252 lb (114.3 kg)  10/03/19 252 lb (114.3 kg)    Physical Exam Vitals and nursing note reviewed.  Constitutional:      General: He is not in acute distress.    Appearance: Normal appearance. He is not ill-appearing, toxic-appearing or diaphoretic.  HENT:     Head: Normocephalic and atraumatic.     Right Ear: External ear normal.     Left Ear: External ear normal.     Nose: Nose normal.     Mouth/Throat:     Mouth: Mucous membranes are moist.     Pharynx: Oropharynx is clear.  Eyes:     General: No scleral icterus.       Right eye: No discharge.        Left eye: No discharge.     Conjunctiva/sclera: Conjunctivae normal.     Pupils: Pupils are equal, round, and reactive to light.  Pulmonary:     Effort: Pulmonary effort is normal. No respiratory distress.     Comments: Speaking in full sentences Musculoskeletal:        General: Normal range of motion.     Cervical back: Normal range of motion.  Skin:    Coloration: Skin is not jaundiced or pale.     Findings: No bruising, erythema, lesion or rash.  Neurological:     Mental Status: He is alert and oriented to person, place, and time. Mental status is at baseline.  Psychiatric:        Mood and Affect: Mood normal.        Behavior: Behavior normal.        Thought Content: Thought content normal.        Judgment: Judgment normal.     Results for orders placed or performed in visit on 10/19/19  Microscopic Examination   URINE  Result Value Ref Range   WBC, UA 0-5 0 - 5 /hpf   RBC None seen 0 - 2 /hpf   Epithelial Cells (non renal) 0-10 0 - 10 /hpf   Bacteria, UA None seen None seen/Few  Urinalysis, Complete  Result Value Ref Range   Specific  Gravity, UA 1.020 1.005 - 1.030   pH, UA 5.5 5.0 - 7.5   Color, UA Yellow Yellow   Appearance Ur Clear Clear   Leukocytes,UA Negative Negative   Protein,UA Negative Negative/Trace   Glucose, UA Negative Negative   Ketones, UA Negative Negative   RBC, UA Negative Negative   Bilirubin, UA Negative Negative   Urobilinogen, Ur 0.2 0.2 - 1.0 mg/dL   Nitrite, UA Negative Negative   Microscopic Examination See below:       Assessment & Plan:   Problem List Items Addressed This Visit      Respiratory   Rhinitis, allergic - Primary    Will treat with predinisone burst. If not better by middle of next week, consider abx. Continue antihistamines and singulair and nasal steroids. Continue to monitor.           Follow up plan: Return if symptoms worsen or fail to improve.   . This visit was completed via MyChart due to the restrictions of the COVID-19 pandemic. All issues as above were discussed and addressed. Physical exam was done as above through visual confirmation on MyChart. If it was felt that the patient should be evaluated in the office, they were directed there. The patient verbally consented to this visit. . Location of the patient: home . Location of the provider: work . Those involved with this call:  . Provider: Park Liter, DO . CMA: Lauretta Grill, RMA . Front Desk/Registration: Don Perking  . Time spent on call: 15 minutes with patient face to face via video conference. More than 50% of this time was spent in counseling and coordination of care. 23 minutes total spent in review of patient's record and preparation of their chart.

## 2020-03-07 NOTE — Assessment & Plan Note (Signed)
Will treat with predinisone burst. If not better by middle of next week, consider abx. Continue antihistamines and singulair and nasal steroids. Continue to monitor.

## 2020-03-10 DIAGNOSIS — N319 Neuromuscular dysfunction of bladder, unspecified: Secondary | ICD-10-CM | POA: Diagnosis not present

## 2020-03-10 DIAGNOSIS — N35919 Unspecified urethral stricture, male, unspecified site: Secondary | ICD-10-CM | POA: Diagnosis not present

## 2020-03-10 DIAGNOSIS — R339 Retention of urine, unspecified: Secondary | ICD-10-CM | POA: Diagnosis not present

## 2020-03-10 DIAGNOSIS — N401 Enlarged prostate with lower urinary tract symptoms: Secondary | ICD-10-CM | POA: Diagnosis not present

## 2020-03-11 ENCOUNTER — Encounter: Payer: Self-pay | Admitting: Family Medicine

## 2020-03-12 ENCOUNTER — Encounter: Payer: Self-pay | Admitting: Family Medicine

## 2020-03-13 ENCOUNTER — Other Ambulatory Visit: Payer: Self-pay | Admitting: Family Medicine

## 2020-03-16 MED ORDER — DOXYCYCLINE HYCLATE 100 MG PO TABS: 100.0000 mg | ORAL_TABLET | Freq: Two times a day (BID) | ORAL | 0 refills | Status: DC

## 2020-03-20 ENCOUNTER — Other Ambulatory Visit: Payer: Self-pay | Admitting: Family Medicine

## 2020-04-07 ENCOUNTER — Encounter: Payer: Self-pay | Admitting: Family Medicine

## 2020-04-11 ENCOUNTER — Ambulatory Visit (INDEPENDENT_AMBULATORY_CARE_PROVIDER_SITE_OTHER): Payer: PPO | Admitting: Family Medicine

## 2020-04-11 ENCOUNTER — Other Ambulatory Visit: Payer: Self-pay

## 2020-04-11 ENCOUNTER — Encounter: Payer: Self-pay | Admitting: Family Medicine

## 2020-04-11 VITALS — BP 143/88 | HR 84 | Temp 97.7°F | Wt 264.4 lb

## 2020-04-11 DIAGNOSIS — R1011 Right upper quadrant pain: Secondary | ICD-10-CM

## 2020-04-11 DIAGNOSIS — R079 Chest pain, unspecified: Secondary | ICD-10-CM

## 2020-04-11 MED ORDER — NYSTATIN 100000 UNIT/GM EX CREA: 1.0000 "application " | TOPICAL_CREAM | Freq: Two times a day (BID) | CUTANEOUS | 3 refills | Status: DC

## 2020-04-11 MED ORDER — CYCLOBENZAPRINE HCL 10 MG PO TABS: 10.0000 mg | ORAL_TABLET | Freq: Three times a day (TID) | ORAL | 0 refills | Status: DC | PRN

## 2020-04-11 MED ORDER — NAPROXEN 500 MG PO TABS: 500.0000 mg | ORAL_TABLET | Freq: Two times a day (BID) | ORAL | 1 refills | Status: DC

## 2020-04-11 NOTE — Progress Notes (Signed)
BP (!) 143/88 (BP Location: Left Arm, Patient Position: Sitting, Cuff Size: Normal)   Pulse 84   Temp 97.7 F (36.5 C) (Oral)   Wt 264 lb 6.4 oz (119.9 kg)   SpO2 97%   BMI 35.86 kg/m    Subjective:    Patient ID: Derrick Ball., male    DOB: August 30, 1955, 64 y.o.   MRN: [id number]  HPI: Derrick Deckman. is a 64 y.o. male  Chief Complaint  Patient presents with  . Rash    nystatin refill  . Pain    under rib on right side   Has been having some pain from his back around his ribs on the R side. Started about 3 weeks ago  Sleeping and aleve makes it better, mowing made it wose. Moving his arm seems to make it worse.  Has been feeling tired and SOB. Has been also feeling tired and feels worse. Increase indigestion, still has his gall bladder. No nausea, no vomiting, no diarrhea. No other concerns or complaints at this time.   Relevant past medical, surgical, family and social history reviewed and updated as indicated. Interim medical history since our last visit reviewed. Allergies and medications reviewed and updated.  Review of Systems  Constitutional: Negative.   Respiratory: Negative.   Cardiovascular: Negative.   Gastrointestinal: Positive for abdominal pain. Negative for abdominal distention, anal bleeding, blood in stool, constipation, diarrhea, nausea, rectal pain and vomiting.  Musculoskeletal: Positive for back pain and myalgias. Negative for arthralgias, gait problem, joint swelling, neck pain and neck stiffness.  Skin: Negative.   Neurological: Negative.   Psychiatric/Behavioral: Negative.     Per HPI unless specifically indicated above     Objective:    BP (!) 143/88 (BP Location: Left Arm, Patient Position: Sitting, Cuff Size: Normal)   Pulse 84   Temp 97.7 F (36.5 C) (Oral)   Wt 264 lb 6.4 oz (119.9 kg)   SpO2 97%   BMI 35.86 kg/m   Wt Readings from Last 3 Encounters:  04/11/20 264 lb 6.4 oz (119.9 kg)  10/19/19 252 lb (114.3 kg)  10/05/19  252 lb (114.3 kg)    Physical Exam Vitals and nursing note reviewed.  Constitutional:      General: He is not in acute distress.    Appearance: Normal appearance. He is not ill-appearing, toxic-appearing or diaphoretic.  HENT:     Head: Normocephalic and atraumatic.     Right Ear: External ear normal.     Left Ear: External ear normal.     Nose: Nose normal.     Mouth/Throat:     Mouth: Mucous membranes are moist.     Pharynx: Oropharynx is clear.  Eyes:     General: No scleral icterus.       Right eye: No discharge.        Left eye: No discharge.     Extraocular Movements: Extraocular movements intact.     Conjunctiva/sclera: Conjunctivae normal.     Pupils: Pupils are equal, round, and reactive to light.  Cardiovascular:     Rate and Rhythm: Normal rate and regular rhythm.     Pulses: Normal pulses.     Heart sounds: Normal heart sounds. No murmur heard.  No friction rub. No gallop.   Pulmonary:     Effort: Pulmonary effort is normal. No respiratory distress.     Breath sounds: Normal breath sounds. No stridor. No wheezing, rhonchi or rales.  Chest:  Chest wall: No tenderness.  Abdominal:     General: Abdomen is flat. Bowel sounds are normal. There is no distension.     Palpations: Abdomen is soft. There is no mass.     Tenderness: There is abdominal tenderness (RUQ). There is no right CVA tenderness, left CVA tenderness, guarding or rebound.     Hernia: No hernia is present.  Musculoskeletal:        General: Normal range of motion.     Cervical back: Normal range of motion and neck supple.  Skin:    General: Skin is warm and dry.     Capillary Refill: Capillary refill takes less than 2 seconds.     Coloration: Skin is not jaundiced or pale.     Findings: No bruising, erythema, lesion or rash.  Neurological:     General: No focal deficit present.     Mental Status: He is alert and oriented to person, place, and time. Mental status is at baseline.  Psychiatric:          Mood and Affect: Mood normal.        Behavior: Behavior normal.        Thought Content: Thought content normal.        Judgment: Judgment normal.     Results for orders placed or performed in visit on 10/19/19  Microscopic Examination   URINE  Result Value Ref Range   WBC, UA 0-5 0 - 5 /hpf   RBC None seen 0 - 2 /hpf   Epithelial Cells (non renal) 0-10 0 - 10 /hpf   Bacteria, UA None seen None seen/Few  Urinalysis, Complete  Result Value Ref Range   Specific Gravity, UA 1.020 1.005 - 1.030   pH, UA 5.5 5.0 - 7.5   Color, UA Yellow Yellow   Appearance Ur Clear Clear   Leukocytes,UA Negative Negative   Protein,UA Negative Negative/Trace   Glucose, UA Negative Negative   Ketones, UA Negative Negative   RBC, UA Negative Negative   Bilirubin, UA Negative Negative   Urobilinogen, Ur 0.2 0.2 - 1.0 mg/dL   Nitrite, UA Negative Negative   Microscopic Examination See below:       Assessment & Plan:   Problem List Items Addressed This Visit    None    Visit Diagnoses    RUQ pain    -  Primary   Likely musculoskeletal. Will start naproxen and flexeril and get RUQ [place]. Follow up in about 2 weeks. EKG normal today. Call with any concerns.    Relevant Orders   US Abdomen Limited RUQ   Chest pain, unspecified type       EKG normal today by my interpretation. Continue to monitor. Call with any concerns.    Relevant Orders   EKG 12-Lead (Completed)       Follow up plan: Return in about 2 weeks (around 04/25/2020).

## 2020-04-18 ENCOUNTER — Telehealth: Payer: Self-pay | Admitting: Family Medicine

## 2020-04-18 ENCOUNTER — Ambulatory Visit
Admission: RE | Admit: 2020-04-18 | Discharge: 2020-04-18 | Disposition: A | Discharge status: Home / Self Care | Payer: PPO | Source: Ambulatory Visit | Attending: Family Medicine | Admitting: Family Medicine

## 2020-04-18 ENCOUNTER — Other Ambulatory Visit: Payer: Self-pay

## 2020-04-18 DIAGNOSIS — R1011 Right upper quadrant pain: Secondary | ICD-10-CM | POA: Diagnosis not present

## 2020-04-18 DIAGNOSIS — K760 Fatty (change of) liver, not elsewhere classified: Secondary | ICD-10-CM | POA: Diagnosis not present

## 2020-04-18 NOTE — Telephone Encounter (Signed)
Called patient to go over the [place] results. No more pain. Will hod on HIDA as he's feeling well. If pain returns will get him scheduled for HIDA due to gall bladder sludge.

## 2020-04-22 NOTE — Progress Notes (Signed)
Interpreted by me. NSR at 65bpm, No ST segment changes.

## 2020-04-25 ENCOUNTER — Telehealth (INDEPENDENT_AMBULATORY_CARE_PROVIDER_SITE_OTHER): Payer: PPO | Admitting: Family Medicine

## 2020-04-25 ENCOUNTER — Encounter: Payer: Self-pay | Admitting: Family Medicine

## 2020-04-25 VITALS — BP 130/84 | HR 70

## 2020-04-25 DIAGNOSIS — R1011 Right upper quadrant pain: Secondary | ICD-10-CM | POA: Diagnosis not present

## 2020-04-25 NOTE — Progress Notes (Signed)
BP 130/84   Pulse 70    Subjective:    Patient ID: Derrick Ball., male    DOB: 03/10/1956, 64 y.o.   MRN: [id number]  HPI: Derrick Watling. is a 64 y.o. male  Chief Complaint  Patient presents with  . RUQ pain   Pan is pretty much resolved. Derrick Sandoval has been using his naproxen and his flexeril and Derrick Sandoval's feeling more like himself. No nausea. No vomiting. Derrick Sandoval is having some allergies with the change in the season. Derrick Sandoval is otherwise feeling well with no other concerns or complaints at this time.   Relevant past medical, surgical, family and social history reviewed and updated as indicated. Interim medical history since our last visit reviewed. Allergies and medications reviewed and updated.  Review of Systems  Constitutional: Negative.   HENT: Negative.   Respiratory: Negative.   Cardiovascular: Negative.   Gastrointestinal: Negative.   Psychiatric/Behavioral: Negative.     Per HPI unless specifically indicated above     Objective:    BP 130/84   Pulse 70   Wt Readings from Last 3 Encounters:  04/11/20 264 lb 6.4 oz (119.9 kg)  10/19/19 252 lb (114.3 kg)  10/05/19 252 lb (114.3 kg)    Physical Exam Vitals and nursing note reviewed.  Constitutional:      General: Derrick Sandoval is not in acute distress.    Appearance: Normal appearance. Derrick Sandoval is not ill-appearing, toxic-appearing or diaphoretic.  HENT:     Head: Normocephalic and atraumatic.     Right Ear: External ear normal.     Left Ear: External ear normal.     Nose: Nose normal.     Mouth/Throat:     Mouth: Mucous membranes are moist.     Pharynx: Oropharynx is clear.  Eyes:     General: No scleral icterus.       Right eye: No discharge.        Left eye: No discharge.     Conjunctiva/sclera: Conjunctivae normal.     Pupils: Pupils are equal, round, and reactive to light.  Pulmonary:     Effort: Pulmonary effort is normal. No respiratory distress.     Comments: Speaking in full sentences Musculoskeletal:         General: Normal range of motion.     Cervical back: Normal range of motion.  Skin:    Coloration: Skin is not jaundiced or pale.     Findings: No bruising, erythema, lesion or rash.  Neurological:     Mental Status: Derrick Sandoval is alert and oriented to person, place, and time. Mental status is at baseline.  Psychiatric:        Mood and Affect: Mood normal.        Behavior: Behavior normal.        Thought Content: Thought content normal.        Judgment: Judgment normal.     Results for orders placed or performed in visit on 10/19/19  Microscopic Examination   URINE  Result Value Ref Range   WBC, UA 0-5 0 - 5 /hpf   RBC None seen 0 - 2 /hpf   Epithelial Cells (non renal) 0-10 0 - 10 /hpf   Bacteria, UA None seen None seen/Few  Urinalysis, Complete  Result Value Ref Range   Specific Gravity, UA 1.020 1.005 - 1.030   pH, UA 5.5 5.0 - 7.5   Color, UA Yellow Yellow   Appearance Ur Clear Clear   Leukocytes,UA Negative  Negative   Protein,UA Negative Negative/Trace   Glucose, UA Negative Negative   Ketones, UA Negative Negative   RBC, UA Negative Negative   Bilirubin, UA Negative Negative   Urobilinogen, Ur 0.2 0.2 - 1.0 mg/dL   Nitrite, UA Negative Negative   Microscopic Examination See below:       Assessment & Plan:   Problem List Items Addressed This Visit    None    Visit Diagnoses    RUQ pain    -  Primary   Resolved. Continue PRN naproxen and flexeril. Call with any concerns.        Follow up plan: Return if symptoms worsen or fail to improve.    . This visit was completed via MyChart due to the restrictions of the COVID-19 pandemic. All issues as above were discussed and addressed. Physical exam was done as above through visual confirmation on MyChart. If it was felt that the patient should be evaluated in the office, they were directed there. The patient verbally consented to this visit. . Location of the patient: home . Location of the provider: home . Those  involved with this call:  . Provider: Park Liter, DO . CMA: Yvonna Alanis, [place] . Front Desk/Registration: Don Perking  . Time spent on call: 10 minutes with patient face to face via video conference. More than 50% of this time was spent in counseling and coordination of care. 15 minutes total spent in review of patient's record and preparation of their chart.

## 2020-05-14 ENCOUNTER — Encounter: Payer: Self-pay | Admitting: Family Medicine

## 2020-05-14 ENCOUNTER — Other Ambulatory Visit: Payer: PPO

## 2020-05-14 DIAGNOSIS — Z20822 Contact with and (suspected) exposure to covid-19: Secondary | ICD-10-CM

## 2020-05-15 LAB — SARS-COV-2, NAA 2 DAY TAT

## 2020-05-15 LAB — NOVEL CORONAVIRUS, NAA: SARS-CoV-2, NAA: NOT DETECTED

## 2020-05-16 ENCOUNTER — Telehealth (INDEPENDENT_AMBULATORY_CARE_PROVIDER_SITE_OTHER): Payer: PPO | Admitting: Nurse Practitioner

## 2020-05-16 ENCOUNTER — Encounter: Payer: Self-pay | Admitting: Family Medicine

## 2020-05-16 ENCOUNTER — Encounter: Payer: Self-pay | Admitting: Nurse Practitioner

## 2020-05-16 VITALS — BP 130/92 | HR 65 | Temp 97.0°F | Ht 68.0 in | Wt 265.0 lb

## 2020-05-16 DIAGNOSIS — J0100 Acute maxillary sinusitis, unspecified: Secondary | ICD-10-CM

## 2020-05-16 MED ORDER — AMOXICILLIN 875 MG PO TABS: 875.0000 mg | ORAL_TABLET | Freq: Two times a day (BID) | ORAL | 0 refills | Status: AC

## 2020-05-16 MED ORDER — GUAIFENESIN ER 600 MG PO TB12: 600.0000 mg | ORAL_TABLET | Freq: Two times a day (BID) | ORAL | 0 refills | Status: DC | PRN

## 2020-05-16 NOTE — Patient Instructions (Signed)

## 2020-05-16 NOTE — Progress Notes (Signed)
MyChart Video Visit    Virtual Visit via Video Note   This visit type was conducted due to national recommendations for restrictions regarding the COVID-19 Pandemic (e.g. social distancing) in an effort to limit this patient's exposure and mitigate transmission in our community. This patient is at least at moderate risk for complications without adequate follow up. This format is felt to be most appropriate for this patient at this time. Physical exam was limited by quality of the video and audio technology used for the visit.   Patient location: home Provider location: home  I discussed the limitations of evaluation and management by telemedicine and the availability of in person appointments. The patient expressed understanding and agreed to proceed.  Patient: Derrick Sandoval.   DOB: 1956-03-30   64 y.o. Male  MRN: [id number] Visit Date: 05/16/2020  Today's healthcare provider: Eulogio Bear, NP   Chief Complaint  Patient presents with  . URI   Subjective    HPI  Upper respiratory symptoms He complains of achiness, congestion, facial pain, headache described as pressure, nasal congestion, nausea without vomiting, no  fever, purulent nasal discharge, sinus pressure and sore throat.with no fever, chills, night sweats or weight loss. Onset of symptoms was a few weeks ago and worsening.He is drinking plenty of fluids.  Past history is significant for URI. Patient is non-smoker. Patient reports taking Zyrtec-D, reports no symptom control. Patient reports having to [place] inhaler more often but reports no symptom improvement. Patient had a COVID test done and it is negative.   Reports he is breathing pretty good bu this chest is a little tight.  The congestion is worst in his nose and sinuses. ---------------------------------------------------------------------------------------------------  Patient Active Problem List   Diagnosis Date Noted  . Tension headache 09/18/2019  .  Shingles 02/19/2019  . Gastric mass   . Genital herpes 01/23/2016  . Skin lesion of left arm 08/16/2015  . Nail abnormality 08/04/2015  . Short-term memory loss 08/04/2015  . Dysphagia 05/12/2015  . Vitamin D deficiency 05/12/2015  . Blood in the stool 05/12/2015  . GERD (gastroesophageal reflux disease) 01/29/2015  . OA (osteoarthritis) of knee 01/29/2015  . Fatty liver 01/28/2015  . Left ventricular hypertrophy 01/28/2015  . Morbid obesity ([place]) 01/28/2015  . Rhinitis, allergic 01/28/2015  . Elevated transaminase level 01/28/2015  . Hyperlipidemia 01/28/2015  . Benign hypertension 01/28/2015  . Benign prostatic hyperplasia with lower urinary tract symptoms 01/28/2015   Social History   Tobacco Use  . Smoking status: Never Smoker  . Smokeless tobacco: Never Used  Vaping Use  . Vaping Use: Never used  Substance Use Topics  . Alcohol use: Not Currently    Alcohol/week: 0.0 standard drinks    Comment: occasionally, twice a year   . Drug use: No   No Known Allergies    Medications: Outpatient Medications Prior to Visit  Medication Sig  . acyclovir (ZOVIRAX) 400 MG tablet Take 1 tablet by mouth twice daily  . albuterol (VENTOLIN HFA) 108 (90 Base) MCG/ACT inhaler Inhale 1-2 puffs into the lungs every 6 (six) hours as needed for wheezing or shortness of breath.  . AMITIZA 8 MCG capsule TAKE 1 CAPSULE BY MOUTH TWICE DAILY WITH MEALS  . Ascorbic Acid (VITAMIN C) 1000 MG tablet Take 1,000 mg by mouth at bedtime.   Marland Kitchen aspirin EC 81 MG tablet Take 81 mg by mouth daily.  . cholecalciferol (VITAMIN D) 1000 UNITS tablet Take 1,000 Units by mouth daily.   Marland Kitchen  cyclobenzaprine (FLEXERIL) 10 MG tablet Take 1 tablet (10 mg total) by mouth 3 (three) times daily as needed for muscle spasms.  . famotidine (PEPCID) 20 MG tablet Take 1 tablet by mouth twice daily  . fluticasone (FLONASE) 50 MCG/ACT nasal spray Place 1 spray into both nostrils 2 (two) times daily.  . hydrOXYzine  (ATARAX/VISTARIL) 50 MG tablet Take 1-2 tablets (50-100 mg total) by mouth 3 (three) times daily as needed.  Marland Kitchen lisinopril (ZESTRIL) 20 MG tablet Take 1 tablet by mouth once daily  . loratadine (CLARITIN) 10 MG tablet Take 1 tablet (10 mg total) by mouth daily.  . montelukast (SINGULAIR) 10 MG tablet TAKE 1 TABLET BY MOUTH AT BEDTIME  . Multiple Vitamins-Minerals (MULTIVITAMIN WITH MINERALS) tablet Take 1 tablet by mouth daily.  Marland Kitchen nystatin cream (MYCOSTATIN) Apply 1 application topically 2 (two) times daily.  Marland Kitchen omeprazole (PRILOSEC) 40 MG capsule Take 1 capsule by mouth once daily  . tadalafil (CIALIS) 5 MG tablet Take 1 tablet (5 mg total) by mouth daily as needed for erectile dysfunction.  . triamcinolone ointment (KENALOG) 0.1 % Apply 1 application topically 2 (two) times daily.  . naproxen (NAPROSYN) 500 MG tablet Take 1 tablet (500 mg total) by mouth 2 (two) times daily with a meal. (Patient not taking: Reported on 05/16/2020)   No facility-administered medications prior to visit.    Review of Systems  Constitutional: Positive for appetite change. Negative for chills and fever.  HENT: Positive for congestion, ear pain, rhinorrhea, sinus pressure, sinus pain, sore throat and voice change. Negative for trouble swallowing.   Respiratory: Positive for chest tightness. Negative for cough, shortness of breath and wheezing.   Cardiovascular: Negative for chest pain and palpitations.  Gastrointestinal: Positive for nausea. Negative for abdominal distention and vomiting.  Skin: Negative.  Negative for rash.  Neurological: Positive for headaches. Negative for dizziness and light-headedness.    Objective    BP (!) 130/92 (BP Location: Left Arm, Patient Position: Sitting, Cuff Size: Normal)   Pulse 65   Temp (!) 97 F (36.1 C)   Ht 5\' 8"  (1.727 m)   Wt 265 lb (120.2 kg)   BMI 40.29 kg/m  BP Readings from Last 3 Encounters:  05/16/20 (!) 130/92  04/25/20 130/84  04/11/20 (!) 143/88   Wt  Readings from Last 3 Encounters:  05/16/20 265 lb (120.2 kg)  04/11/20 264 lb 6.4 oz (119.9 kg)  10/19/19 252 lb (114.3 kg)   Physical Exam Vitals and nursing note reviewed.  Constitutional:      General: He is not in acute distress.    Appearance: He is obese. He is ill-appearing.  HENT:     Head: Normocephalic and atraumatic.     Right Ear: External ear normal.     Left Ear: External ear normal.     Nose: Congestion and rhinorrhea present.     Mouth/Throat:     Mouth: Mucous membranes are moist.     Pharynx: Oropharynx is clear.  Eyes:     General: No scleral icterus.       Right eye: No discharge.        Left eye: No discharge.     Extraocular Movements: Extraocular movements intact.  Cardiovascular:     Comments: Unable to assess heart sounds via virtual visit Pulmonary:     Effort: Pulmonary effort is normal. No respiratory distress.     Comments: Unable to assess lung sounds via virtual visit Abdominal:     Comments:  Unable to assess bowel sounds via virtual visit  Skin:    Coloration: Skin is not jaundiced or pale.     Findings: No erythema.  Neurological:     Mental Status: He is alert and oriented to person, place, and time.  Psychiatric:        Mood and Affect: Mood normal.        Behavior: Behavior normal.        Thought Content: Thought content normal.        Judgment: Judgment normal.        Assessment & Plan    1. Acute non-recurrent maxillary sinusitis Acute, ongoing x 2 weeks.  Likely bacterial at this point.  Will start amoxicillin-  instructed to complete full course of antibiotic.  Continue supportive care with guaifenesin, hydration, rest, and eating easy-to-digest foods.  With any chest pain or shortness of breath, seek emergent care. - guaiFENesin (MUCINEX) 600 MG 12 hr tablet; Take 1 tablet (600 mg total) by mouth 2 (two) times daily as needed for cough or to loosen phlegm.  Dispense: 30 tablet; Refill: 0 - amoxicillin (AMOXIL) 875 MG tablet;  Take 1 tablet (875 mg total) by mouth every 12 (twelve) hours for 7 days.  Dispense: 14 tablet; Refill: 0   Return if symptoms worsen or fail to improve.     I discussed the assessment and treatment plan with the patient. The patient was provided an opportunity to ask questions and all were answered. The patient agreed with the plan and demonstrated an understanding of the instructions.   The patient was advised to call back or seek an in-person evaluation if the symptoms worsen or if the condition fails to improve as anticipated.  I provided 15 minutes of non-face-to-face time during this encounter.  I have reviewed this encounter including the documentation in this note. I am certifying that I agree with the content of this note as primary care provider.   Eulogio Bear, NP [facility] [phone] (phone) [phone] (fax)  [place]

## 2020-05-20 ENCOUNTER — Encounter: Payer: Self-pay | Admitting: Nurse Practitioner

## 2020-05-20 MED ORDER — PREDNISONE 10 MG PO TABS: ORAL_TABLET | ORAL | 0 refills | Status: AC

## 2020-06-03 DIAGNOSIS — N401 Enlarged prostate with lower urinary tract symptoms: Secondary | ICD-10-CM | POA: Diagnosis not present

## 2020-06-03 DIAGNOSIS — N319 Neuromuscular dysfunction of bladder, unspecified: Secondary | ICD-10-CM | POA: Diagnosis not present

## 2020-06-03 DIAGNOSIS — N35919 Unspecified urethral stricture, male, unspecified site: Secondary | ICD-10-CM | POA: Diagnosis not present

## 2020-06-03 DIAGNOSIS — R339 Retention of urine, unspecified: Secondary | ICD-10-CM | POA: Diagnosis not present

## 2020-06-16 ENCOUNTER — Ambulatory Visit (INDEPENDENT_AMBULATORY_CARE_PROVIDER_SITE_OTHER): Payer: PPO

## 2020-06-16 VITALS — Ht 67.0 in | Wt 260.0 lb

## 2020-06-16 DIAGNOSIS — I10 Essential (primary) hypertension: Secondary | ICD-10-CM | POA: Diagnosis not present

## 2020-06-16 DIAGNOSIS — Z0000 Encounter for general adult medical examination without abnormal findings: Secondary | ICD-10-CM

## 2020-06-16 NOTE — Progress Notes (Signed)
I connected with Derrick Sandoval. today by telephone and verified that I am speaking with the correct person using two identifiers. Location patient: home Location provider: work Persons participating in the virtual visit: Derrick Sandoval., Glenna Durand LPN.   I discussed the limitations, risks, security and privacy concerns of performing an evaluation and management service by telephone and the availability of in person appointments. I also discussed with the patient that there may be a patient responsible charge related to this service. The patient expressed understanding and verbally consented to this telephonic visit.    Interactive audio and video telecommunications were attempted between this provider and patient, however failed, due to patient having technical difficulties OR patient did not have access to video capability.  We continued and completed visit with audio only.     Vital signs may be patient reported or missing.  Subjective:   Derrick Sandoval. is a 65 y.o. male who presents for Medicare Annual/Subsequent preventive examination.  Review of Systems     Cardiac Risk Factors include: advanced age (>74men, >31 women);obesity (BMI >30kg/m2);male gender;sedentary lifestyle     Objective:    Today's Vitals   06/16/20 0941 06/16/20 0942  Weight: 260 lb (117.9 kg)   Height: 5\' 7"  (1.702 m)   PainSc:  7    Body mass index is 40.72 kg/m.  Advanced Directives 06/16/2020 06/06/2019 06/01/2018 05/29/2018 05/12/2018 04/21/2018 10/08/2017  Does Patient Have a Medical Advance Directive? No No Yes No No No No  Would patient like information on creating a medical advance directive? - - - No - Patient declined - Yes (MAU/Ambulatory/Procedural Areas - Information given) No - Patient declined    Current Medications (verified) Outpatient Encounter Medications as of 06/16/2020  Medication Sig  . acyclovir (ZOVIRAX) 400 MG tablet Take 1 tablet by mouth twice daily  . albuterol  (VENTOLIN HFA) 108 (90 Base) MCG/ACT inhaler Inhale 1-2 puffs into the lungs every 6 (six) hours as needed for wheezing or shortness of breath.  . AMITIZA 8 MCG capsule TAKE 1 CAPSULE BY MOUTH TWICE DAILY WITH MEALS  . Ascorbic Acid (VITAMIN C) 1000 MG tablet Take 1,000 mg by mouth at bedtime.   Marland Kitchen aspirin EC 81 MG tablet Take 81 mg by mouth daily.  . cholecalciferol (VITAMIN D) 1000 UNITS tablet Take 1,000 Units by mouth daily.   . cyclobenzaprine (FLEXERIL) 10 MG tablet Take 1 tablet (10 mg total) by mouth 3 (three) times daily as needed for muscle spasms.  . famotidine (PEPCID) 20 MG tablet Take 1 tablet by mouth twice daily  . fluticasone (FLONASE) 50 MCG/ACT nasal spray Place 1 spray into both nostrils 2 (two) times daily.  Marland Kitchen guaiFENesin (MUCINEX) 600 MG 12 hr tablet Take 1 tablet (600 mg total) by mouth 2 (two) times daily as needed for cough or to loosen phlegm.  . hydrOXYzine (ATARAX/VISTARIL) 50 MG tablet Take 1-2 tablets (50-100 mg total) by mouth 3 (three) times daily as needed.  Marland Kitchen lisinopril (ZESTRIL) 20 MG tablet Take 1 tablet by mouth once daily  . loratadine (CLARITIN) 10 MG tablet Take 1 tablet (10 mg total) by mouth daily.  . montelukast (SINGULAIR) 10 MG tablet TAKE 1 TABLET BY MOUTH AT BEDTIME  . Multiple Vitamins-Minerals (MULTIVITAMIN WITH MINERALS) tablet Take 1 tablet by mouth daily.  . naproxen (NAPROSYN) 500 MG tablet Take 1 tablet (500 mg total) by mouth 2 (two) times daily with a meal.  . nystatin cream (MYCOSTATIN) Apply 1 application topically  2 (two) times daily.  Marland Kitchen omeprazole (PRILOSEC) 40 MG capsule Take 1 capsule by mouth once daily  . tadalafil (CIALIS) 5 MG tablet Take 1 tablet (5 mg total) by mouth daily as needed for erectile dysfunction.  . triamcinolone ointment (KENALOG) 0.1 % Apply 1 application topically 2 (two) times daily.   No facility-administered encounter medications on file as of 06/16/2020.    Allergies (verified) Patient has no known allergies.    History: Past Medical History:  Diagnosis Date  . Allergy   . Arthritis   . Cough    nonproductive no fever saw at dr 05-26-18  . GERD (gastroesophageal reflux disease)   . Hyperlipidemia   . Hypertension   . Meningitis due to viruses 1968   Past Surgical History:  Procedure Laterality Date  . COLONOSCOPY WITH PROPOFOL N/A 05/12/2018   Procedure: COLONOSCOPY WITH PROPOFOL;  Surgeon: Jonathon Bellows, MD;  [facility] ENDOSCOPY;  Service: Gastroenterology;  Laterality: N/A;  . ESOPHAGOGASTRODUODENOSCOPY (EGD) WITH PROPOFOL N/A 05/12/2018   Procedure: ESOPHAGOGASTRODUODENOSCOPY (EGD) WITH PROPOFOL;  Surgeon: Jonathon Bellows, MD;  Location: [facility] ENDOSCOPY;  Service: Gastroenterology;  Laterality: N/A;  . ESOPHAGOGASTRODUODENOSCOPY (EGD) WITH PROPOFOL N/A 06/01/2018   Procedure: ESOPHAGOGASTRODUODENOSCOPY (EGD) WITH PROPOFOL;  Surgeon: Milus Banister, MD;  Location: [facility] ENDOSCOPY;  Service: Endoscopy;  Laterality: N/A;  . EUS N/A 06/01/2018   Procedure: UPPER ENDOSCOPIC ULTRASOUND (EUS) RADIAL;  Surgeon: Milus Banister, MD;  Location: [facility] ENDOSCOPY;  Service: Endoscopy;  Laterality: N/A;  . JOINT REPLACEMENT Bilateral   . NASAL SINUS SURGERY    . TOTAL KNEE ARTHROPLASTY     Family History  Problem Relation Age of Onset  . Breast cancer Mother   . Cancer Father        unsure, passed away before it was confirmed  . Pancreatic cancer Daughter   . Diabetes Neg Hx   . Heart disease Neg Hx   . Hypertension Neg Hx   . Stroke Neg Hx   . COPD Neg Hx   . Colon cancer Neg Hx    Social History   Socioeconomic History  . Marital status: Married    Spouse name: Not on file  . Number of children: Not on file  . Years of education: 10th grade, GED  . Highest education level: GED or equivalent  Occupational History  . Occupation: disabled   Tobacco Use  . Smoking status: Never Smoker  . Smokeless tobacco: Never Used  Vaping Use  . Vaping Use: Never used  Substance and Sexual Activity    . Alcohol use: Not Currently    Alcohol/week: 0.0 standard drinks    Comment: occasionally, twice a year   . Drug use: No  . Sexual activity: Not Currently  Other Topics Concern  . Not on file  Social History Narrative   [employer] 3x a week    Social Determinants of Health   Financial Resource Strain: Medium Risk  . Difficulty of Paying Living Expenses: Somewhat hard  Food Insecurity:   . Worried About Charity fundraiser in the Last Year: Not on file  . Ran Out of Food in the Last Year: Not on file  Transportation Needs: No Transportation Needs  . Lack of Transportation (Medical): No  . Lack of Transportation (Non-Medical): No  Physical Activity: Inactive  . Days of Exercise per Week: 0 days  . Minutes of Exercise per Session: 0 min  Stress:   . Feeling of Stress : Not on file  Social Connections:   . Frequency of Communication with Friends and Family: Not on file  . Frequency of Social Gatherings with Friends and Family: Not on file  . Attends Religious Services: Not on file  . Active Member of Clubs or Organizations: Not on file  . Attends Archivist Meetings: Not on file  . Marital Status: Not on file    Tobacco Counseling Counseling given: Not Answered   Clinical Intake:  Pre-visit preparation completed: Yes  Pain : 0-10 Pain Score: 7  Pain Type: Chronic pain Pain Location: Other (Comment) (sinsuses) Pain Descriptors / Indicators: Other (Comment) (congestion) Pain Onset: More than a month ago Pain Frequency: Constant Pain Relieving Factors: Allegra D  Pain Relieving Factors: Allegra D  Nutritional Status: BMI > 30  Obese Nutritional Risks: Nausea/ vomitting/ diarrhea (some diarrhea on occasion) Diabetes: No  How often do you need to have someone help you when you read instructions, pamphlets, or other written materials from your doctor or pharmacy?: 1 - Never What is the last grade level you completed in school?: GED  Diabetic?  no  Interpreter Needed?: No  Information entered by :: NAllen LPN   Activities of Daily Living In your present state of health, do you have any difficulty performing the following activities: 06/16/2020  Hearing? N  Vision? N  Difficulty concentrating or making decisions? Y  Comment slight memory  Walking or climbing stairs? N  Dressing or bathing? N  Doing errands, shopping? N  Preparing Food and eating ? N  Using the Toilet? N  In the past six months, have you accidently leaked urine? Y  Comment self catherizations  Do you have problems with loss of bowel control? Y  Managing your Medications? N  Managing your Finances? N  Housekeeping or managing your Housekeeping? N  Some recent data might be hidden    Patient Care Team: Valerie Roys, DO as PCP - General (Family Medicine) Delgaizo, Lavon Paganini, MD as Referring Physician (Orthopedic Surgery) Viprakasit, Marlowe Shores, MD as Referring Physician (Urology)  Indicate any recent Medical Services you may have received from other than [facility] providers in the past year (date may be approximate).     Assessment:   This is a routine wellness examination for Notnamed.  Hearing/Vision screen  Hearing Screening   125Hz  250Hz  500Hz  1000Hz  2000Hz  3000Hz  4000Hz  6000Hz  8000Hz   Right ear:           Left ear:           Vision Screening Comments: No regular eye exams  Dietary issues and exercise activities discussed: Current Exercise Habits: The patient does not participate in regular exercise at present  Goals    . DIET - INCREASE WATER INTAKE     Recommend drinking at least 6-8 glasses of water a day     . Patient Stated     06/16/2020, get back to the gym      Depression Screen PHQ 2/9 Scores 06/16/2020 09/18/2019 06/06/2019 11/24/2018 10/25/2018 04/21/2018 06/06/2017  PHQ - 2 Score 3 4 0 0 0 0 0  PHQ- 9 Score 14 13 - 1 9 - -    Fall Risk Fall Risk  06/16/2020 06/06/2019 02/19/2019 10/25/2018 04/21/2018  Falls in the past year? 0 0 0 0 No   Number falls in past yr: - 0 0 0 -  Injury with Fall? - 0 - 0 -  Comment - - - - -  Risk for fall due to : Medication  side effect - - - -  Follow up Falls evaluation completed;Education provided;Falls prevention discussed - Falls evaluation completed - -    Any stairs in or around the home? No  If so, are there any without handrails? n/a Home free of loose throw rugs in walkways, pet beds, electrical cords, etc? Yes  Adequate lighting in your home to reduce risk of falls? Yes   ASSISTIVE DEVICES UTILIZED TO PREVENT FALLS:  Life alert? No  Use of a cane, walker or w/c? No  Grab bars in the bathroom? No  Shower chair or bench in shower? No  Elevated toilet seat or a handicapped toilet? No   TIMED UP AND GO:  Was the test performed? No .    Cognitive Function:     6CIT Screen 06/16/2020 04/21/2018 02/22/2017  What Year? 0 points 0 points 0 points  What month? 0 points 0 points 0 points  What time? 0 points 0 points 0 points  Count back from 20 0 points 0 points 0 points  Months in reverse 0 points 0 points 0 points  Repeat phrase 0 points 0 points 0 points  Total Score 0 0 0    Immunizations Immunization History  Administered Date(s) Administered  . Influenza,inj,Quad PF,6+ Mos 05/12/2015, 04/29/2016, 05/31/2017, 04/21/2018, 05/04/2019  . Influenza-Unspecified 07/28/2015, 05/04/2019, 05/20/2020  . Moderna SARS-COVID-2 Vaccination 03/29/2020, 04/26/2020  . PFIZER SARS-COV-2 Vaccination 11/09/2019, 11/30/2019  . Td 01/23/2016  . Tdap 01/05/2012, 10/08/2017  . Zoster Recombinat (Shingrix) 05/04/2019, 07/13/2019    TDAP status: Up to date Flu Vaccine status: Up to date Pneumococcal vaccine status: Up to date Covid-19 vaccine status: Completed vaccines  Qualifies for Shingles Vaccine? Yes   Zostavax completed No   Shingrix Completed?: Yes  Screening Tests Health Maintenance  Topic Date Due  . TETANUS/TDAP  10/09/2027  . COLONOSCOPY  05/12/2028  . INFLUENZA VACCINE   Completed  . COVID-19 Vaccine  Completed  . Hepatitis C Screening  Completed  . HIV Screening  Completed    Health Maintenance  There are no preventive care reminders to display for this patient.  Colorectal cancer screening: Completed 05/12/2018. Repeat every 5 years  Lung Cancer Screening: (Low Dose CT Chest recommended if Age 57-80 years, 30 pack-year currently smoking OR have quit w/in 15years.) does not qualify.   Lung Cancer Screening Referral: no  Additional Screening:  Hepatitis C Screening: does qualify; Completed 06/11/2016  Vision Screening: Recommended annual ophthalmology exams for early detection of glaucoma and other disorders of the eye. Is the patient up to date with their annual eye exam?  No  Who is the provider or what is the name of the office in which the patient attends annual eye exams? none If pt is not established with a provider, would they like to be referred to a provider to establish care? No .   Dental Screening: Recommended annual dental exams for proper oral hygiene  Community Resource Referral / Chronic Care Management: CRR required this visit?  No   CCM required this visit?  Yes      Plan:     I have personally reviewed and noted the following in the patient's chart:   . Medical and social history . Use of alcohol, tobacco or illicit drugs  . Current medications and supplements . Functional ability and status . Nutritional status . Physical activity . Advanced directives . List of other physicians . Hospitalizations, surgeries, and ER visits in previous 12 months . Vitals . Screenings to  include cognitive, depression, and falls . Referrals and appointments  In addition, I have reviewed and discussed with patient certain preventive protocols, quality metrics, and best practice recommendations. A written personalized care plan for preventive services as well as general preventive health recommendations were provided to patient.      Kellie Simmering, LPN   24/03/2499   Nurse Notes: Patient's depression screen was a 7. States no previous diagnosis of depression. Says he would be interested in taking medication if it does not cost too much.

## 2020-06-16 NOTE — Patient Instructions (Signed)
Derrick Sandoval , Thank you for taking time to come for your Medicare Wellness Visit. I appreciate your ongoing commitment to your health goals. Please review the following plan we discussed and let me know if I can assist you in the future.   Screening recommendations/referrals: Colonoscopy: completed 05/12/2018, due 05/13/2023 Recommended yearly ophthalmology/optometry visit for glaucoma screening and checkup Recommended yearly dental visit for hygiene and checkup  Vaccinations: Influenza vaccine: completed 05/20/2020 Pneumococcal vaccine: n/a Tdap vaccine: completed 10/08/2017, due 10/09/2027 Shingles vaccine: completed   Covid-19: 04/26/2020, 03/29/2020(moderna), 11/30/2019, 11/09/2019(pfizer)  Advanced directives: Advance directive discussed with you today.    Conditions/risks identified: high depression screen  Next appointment: Follow up in one year for your annual wellness visit   Preventive Care 40-64 Years, Male Preventive care refers to lifestyle choices and visits with your health care provider that can promote health and wellness. What does preventive care include?  A yearly physical exam. This is also called an annual well check.  Dental exams once or twice a year.  Routine eye exams. Ask your health care provider how often you should have your eyes checked.  Personal lifestyle choices, including:  Daily care of your teeth and gums.  Regular physical activity.  Eating a healthy diet.  Avoiding tobacco and drug use.  Limiting alcohol use.  Practicing safe sex.  Taking low-dose aspirin every day starting at age 65. What happens during an annual well check? The services and screenings done by your health care provider during your annual well check will depend on your age, overall health, lifestyle risk factors, and family history of disease. Counseling  Your health care provider may ask you questions about your:  Alcohol use.  Tobacco use.  Drug use.  Emotional  well-being.  Home and relationship well-being.  Sexual activity.  Eating habits.  Work and work Statistician. Screening  You may have the following tests or measurements:  Height, weight, and BMI.  Blood pressure.  Lipid and cholesterol levels. These may be checked every 5 years, or more frequently if you are over 54 years old.  Skin check.  Lung cancer screening. You may have this screening every year starting at age 64 if you have a 30-pack-year history of smoking and currently smoke or have quit within the past 15 years.  Fecal occult blood test (FOBT) of the stool. You may have this test every year starting at age 108.  Flexible sigmoidoscopy or colonoscopy. You may have a sigmoidoscopy every 5 years or a colonoscopy every 10 years starting at age 50.  Prostate cancer screening. Recommendations will vary depending on your family history and other risks.  Hepatitis C blood test.  Hepatitis B blood test.  Sexually transmitted disease (STD) testing.  Diabetes screening. This is done by checking your blood sugar (glucose) after you have not eaten for a while (fasting). You may have this done every 1-3 years. Discuss your test results, treatment options, and if necessary, the need for more tests with your health care provider. Vaccines  Your health care provider may recommend certain vaccines, such as:  Influenza vaccine. This is recommended every year.  Tetanus, diphtheria, and acellular pertussis (Tdap, Td) vaccine. You may need a Td booster every 10 years.  Zoster vaccine. You may need this after age 33.  Pneumococcal 13-valent conjugate (PCV13) vaccine. You may need this if you have certain conditions and have not been vaccinated.  Pneumococcal polysaccharide (PPSV23) vaccine. You may need one or two doses if you smoke cigarettes  or if you have certain conditions. Talk to your health care provider about which screenings and vaccines you need and how often you need  them. This information is not intended to replace advice given to you by your health care provider. Make sure you discuss any questions you have with your health care provider. Document Released: 08/29/2015 Document Revised: 04/21/2016 Document Reviewed: 06/03/2015 Elsevier Interactive Patient Education  2017 [employer] Prevention in the Home Falls can cause injuries. They can happen to people of all ages. There are many things you can do to make your home safe and to help prevent falls. What can I do on the outside of my home?  Regularly fix the edges of walkways and driveways and fix any cracks.  Remove anything that might make you trip as you walk through a door, such as a raised step or threshold.  Trim any bushes or trees on the path to your home.  Use bright outdoor lighting.  Clear any walking paths of anything that might make someone trip, such as rocks or tools.  Regularly check to see if handrails are loose or broken. Make sure that both sides of any steps have handrails.  Any raised decks and porches should have guardrails on the edges.  Have any leaves, snow, or ice cleared regularly.  Use sand or salt on walking paths during winter.  Clean up any spills in your garage right away. This includes oil or grease spills. What can I do in the bathroom?  Use night lights.  Install grab bars by the toilet and in the tub and shower. Do not use towel bars as grab bars.  Use non-skid mats or decals in the tub or shower.  If you need to sit down in the shower, use a plastic, non-slip stool.  Keep the floor dry. Clean up any water that spills on the floor as soon as it happens.  Remove soap buildup in the tub or shower regularly.  Attach bath mats securely with double-sided non-slip rug tape.  Do not have throw rugs and other things on the floor that can make you trip. What can I do in the bedroom?  Use night lights.  Make sure that you have a light by your  bed that is easy to reach.  Do not use any sheets or blankets that are too big for your bed. They should not hang down onto the floor.  Have a firm chair that has side arms. You can use this for support while you get dressed.  Do not have throw rugs and other things on the floor that can make you trip. What can I do in the kitchen?  Clean up any spills right away.  Avoid walking on wet floors.  Keep items that you use a lot in easy-to-reach places.  If you need to reach something above you, use a strong step stool that has a grab bar.  Keep electrical cords out of the way.  Do not use floor polish or wax that makes floors slippery. If you must use wax, use non-skid floor wax.  Do not have throw rugs and other things on the floor that can make you trip. What can I do with my stairs?  Do not leave any items on the stairs.  Make sure that there are handrails on both sides of the stairs and use them. Fix handrails that are broken or loose. Make sure that handrails are as long as the stairways.  Check any carpeting to make sure that it is firmly attached to the stairs. Fix any carpet that is loose or worn.  Avoid having throw rugs at the top or bottom of the stairs. If you do have throw rugs, attach them to the floor with carpet tape.  Make sure that you have a light switch at the top of the stairs and the bottom of the stairs. If you do not have them, ask someone to add them for you. What else can I do to help prevent falls?  Wear shoes that:  Do not have high heels.  Have rubber bottoms.  Are comfortable and fit you well.  Are closed at the toe. Do not wear sandals.  If you use a stepladder:  Make sure that it is fully opened. Do not climb a closed stepladder.  Make sure that both sides of the stepladder are locked into place.  Ask someone to hold it for you, if possible.  Clearly mark and make sure that you can see:  Any grab bars or handrails.  First and last  steps.  Where the edge of each step is.  Use tools that help you move around (mobility aids) if they are needed. These include:  Canes.  Walkers.  Scooters.  Crutches.  Turn on the lights when you go into a dark area. Replace any light bulbs as soon as they burn out.  Set up your furniture so you have a clear path. Avoid moving your furniture around.  If any of your floors are uneven, fix them.  If there are any pets around you, be aware of where they are.  Review your medicines with your doctor. Some medicines can make you feel dizzy. This can increase your chance of falling. Ask your doctor what other things that you can do to help prevent falls. This information is not intended to replace advice given to you by your health care provider. Make sure you discuss any questions you have with your health care provider. Document Released: 05/29/2009 Document Revised: 01/08/2016 Document Reviewed: 09/06/2014 Elsevier Interactive Patient Education  2017 [employer].

## 2020-06-17 ENCOUNTER — Telehealth: Payer: Self-pay

## 2020-06-17 NOTE — Chronic Care Management (AMB) (Signed)
Chronic Care Management   Note  06/17/2020 Name: Derrick Sandoval. MRN: [id number] DOB: Jan 10, 1956  Derrick Sandoval. is a 64 y.o. year old male who is a primary care patient of Dorcas Carrow, DO. I reached out to [employer]. by phone today in response to a referral sent by Mr. Terence Lux Jr.'s patient's AWV (annual wellness visit) nurse, Derrick Sandoval .      Mr. Silmon was given information about Chronic Care Management services today including:  1. CCM service includes personalized support from designated clinical staff supervised by his physician, including individualized plan of care and coordination with other care providers 2. 24/7 contact phone numbers for assistance for urgent and routine care needs. 3. Service will only be billed when office clinical staff spend 20 minutes or more in a month to coordinate care. 4. Only one practitioner may furnish and bill the service in a calendar month. 5. The patient may stop CCM services at any time (effective at the end of the month) by phone call to the office staff. 6. The patient will be responsible for cost sharing (co-pay) of up to 20% of the service fee (after annual deductible is met).  Patient agreed to services and verbal consent obtained.   Follow up plan: Telephone appointment with care management team member scheduled for:07/07/2020  Penne Lash, RMA Care Guide, Embedded Care Coordination [place]  [place], [place] [address] Direct Dial: [phone] [email or web address]: [email or web address]

## 2020-06-27 ENCOUNTER — Encounter: Payer: Self-pay | Admitting: Family Medicine

## 2020-06-30 ENCOUNTER — Other Ambulatory Visit: Payer: Self-pay

## 2020-06-30 ENCOUNTER — Encounter: Payer: Self-pay | Admitting: Nurse Practitioner

## 2020-06-30 ENCOUNTER — Encounter: Payer: Self-pay | Admitting: Family Medicine

## 2020-06-30 MED ORDER — LUBIPROSTONE 8 MCG PO CAPS: 8.0000 ug | ORAL_CAPSULE | Freq: Two times a day (BID) | ORAL | 0 refills | Status: DC

## 2020-06-30 NOTE — Telephone Encounter (Signed)
Medication refill for Amitiza 8 mcg last ov 05/16/20, upcoming ov 07/01/20 .

## 2020-07-01 ENCOUNTER — Telehealth (INDEPENDENT_AMBULATORY_CARE_PROVIDER_SITE_OTHER): Payer: PPO | Admitting: Nurse Practitioner

## 2020-07-01 ENCOUNTER — Other Ambulatory Visit: Payer: Self-pay

## 2020-07-01 ENCOUNTER — Encounter: Payer: Self-pay | Admitting: Nurse Practitioner

## 2020-07-01 VITALS — BP 132/88 | HR 64 | Temp 96.9°F

## 2020-07-01 DIAGNOSIS — J0140 Acute pansinusitis, unspecified: Secondary | ICD-10-CM | POA: Insufficient documentation

## 2020-07-01 MED ORDER — AMOXICILLIN 875 MG PO TABS: 875.0000 mg | ORAL_TABLET | Freq: Two times a day (BID) | ORAL | 0 refills | Status: AC

## 2020-07-01 NOTE — Progress Notes (Signed)
BP 132/88    Pulse 64    Temp (!) 96.9 F (36.1 C) (Oral)    Subjective:    Patient ID: Derrick Sandoval., male    DOB: [birth year], 64 y.o.   MRN: [id number]  HPI: Derrick Sandoval. is a 64 y.o. male presenting with upper respiratory symptoms  Chief Complaint  Patient presents with   URI    pt states he has been having a cough, congestion, and sinus pressure since last week. States he went and had a COVID test last Thursday and got a negative result.    UPPER RESPIRATORY TRACT INFECTION Onset: ~9 days ago; COVID test was negative last week Worst symptom: chest congestion and nose running Fever: no Cough: yes - dry cough Shortness of breath: no Wheezing: no Chest pain: no Chest tightness: no Chest congestion: yes Nasal congestion: yes Runny nose: yes Post nasal drip: yes Sneezing: no Sore throat: no Swollen glands: no Sinus pressure: yes Headache: yes Face pain: yes Toothache: no Ear pain: no  Ear pressure: no  Eyes red/itching:no Eye drainage/crusting: no  Nausea:no Vomiting: no Diarrhea: yes; watery, non-bloody,  Change in appetite: yes Loss of taste/smell: no Rash: no Fatigue: yes Sick contacts: no Strep contacts: no  Context: fluctuating Recurrent sinusitis: no Treatments attempted: allegra and mucinex,  Relief with OTC medications: at first, not working anymore  No Known Allergies  Outpatient Encounter Medications as of 07/01/2020  Medication Sig   acyclovir (ZOVIRAX) 400 MG tablet Take 1 tablet by mouth twice daily   albuterol (VENTOLIN HFA) 108 (90 Base) MCG/ACT inhaler Inhale 1-2 puffs into the lungs every 6 (six) hours as needed for wheezing or shortness of breath.   amoxicillin (AMOXIL) 875 MG tablet Take 1 tablet (875 mg total) by mouth every 12 (twelve) hours for 5 days.   Ascorbic Acid (VITAMIN C) 1000 MG tablet Take 1,000 mg by mouth at bedtime.    aspirin EC 81 MG tablet Take 81 mg by mouth daily.   cholecalciferol (VITAMIN D)  1000 UNITS tablet Take 1,000 Units by mouth daily.    cyclobenzaprine (FLEXERIL) 10 MG tablet Take 1 tablet (10 mg total) by mouth 3 (three) times daily as needed for muscle spasms.   famotidine (PEPCID) 20 MG tablet Take 1 tablet by mouth twice daily   fluticasone (FLONASE) 50 MCG/ACT nasal spray Place 1 spray into both nostrils 2 (two) times daily.   guaiFENesin (MUCINEX) 600 MG 12 hr tablet Take 1 tablet (600 mg total) by mouth 2 (two) times daily as needed for cough or to loosen phlegm.   hydrOXYzine (ATARAX/VISTARIL) 50 MG tablet Take 1-2 tablets (50-100 mg total) by mouth 3 (three) times daily as needed.   lisinopril (ZESTRIL) 20 MG tablet Take 1 tablet by mouth once daily   loratadine (CLARITIN) 10 MG tablet Take 1 tablet (10 mg total) by mouth daily.   lubiprostone (AMITIZA) 8 MCG capsule Take 1 capsule (8 mcg total) by mouth 2 (two) times daily with a meal.   montelukast (SINGULAIR) 10 MG tablet TAKE 1 TABLET BY MOUTH AT BEDTIME   Multiple Vitamins-Minerals (MULTIVITAMIN WITH MINERALS) tablet Take 1 tablet by mouth daily.   naproxen (NAPROSYN) 500 MG tablet Take 1 tablet (500 mg total) by mouth 2 (two) times daily with a meal.   nystatin cream (MYCOSTATIN) Apply 1 application topically 2 (two) times daily.   omeprazole (PRILOSEC) 40 MG capsule Take 1 capsule by mouth once daily   tadalafil (CIALIS)  5 MG tablet Take 1 tablet (5 mg total) by mouth daily as needed for erectile dysfunction.   triamcinolone ointment (KENALOG) 0.1 % Apply 1 application topically 2 (two) times daily.   No facility-administered encounter medications on file as of 07/01/2020.   Patient Active Problem List   Diagnosis Date Noted   Acute non-recurrent pansinusitis 07/01/2020   Tension headache 09/18/2019   Shingles 02/19/2019   Gastric mass    Genital herpes 01/23/2016   Skin lesion of left arm 08/16/2015   Nail abnormality 08/04/2015   Short-term memory loss 08/04/2015   Dysphagia  05/12/2015   Vitamin D deficiency 05/12/2015   Blood in the stool 05/12/2015   GERD (gastroesophageal reflux disease) 01/29/2015   OA (osteoarthritis) of knee 01/29/2015   Fatty liver 01/28/2015   Left ventricular hypertrophy 01/28/2015   Morbid obesity ([place]) 01/28/2015   Rhinitis, allergic 01/28/2015   Elevated transaminase level 01/28/2015   Hyperlipidemia 01/28/2015   Benign hypertension 01/28/2015   Benign prostatic hyperplasia with lower urinary tract symptoms 01/28/2015   Past Medical History:  Diagnosis Date   Allergy    Arthritis    Cough    nonproductive no fever saw at dr 05-26-18   GERD (gastroesophageal reflux disease)    Hyperlipidemia    Hypertension    Meningitis due to viruses 1968   Relevant past medical, surgical, family and social history reviewed and updated as indicated. Interim medical history since our last visit reviewed.  Review of Systems  Constitutional: Positive for activity change, appetite change and fatigue. Negative for diaphoresis and fever.  HENT: Positive for congestion, postnasal drip, rhinorrhea, sinus pressure and sinus pain. Negative for ear discharge, ear pain, hearing loss, mouth sores, sneezing, sore throat and trouble swallowing.   Eyes: Negative.  Negative for pain, discharge, redness and itching.  Respiratory: Positive for cough. Negative for chest tightness, shortness of breath and wheezing.   Cardiovascular: Negative for chest pain.  Gastrointestinal: Positive for diarrhea. Negative for blood in stool, constipation, nausea and vomiting.  Skin: Negative.  Negative for rash.  Neurological: Positive for headaches. Negative for facial asymmetry and weakness.  Psychiatric/Behavioral: Negative.     Per HPI unless specifically indicated above     Objective:    BP 132/88    Pulse 64    Temp (!) 96.9 F (36.1 C) (Oral)   Wt Readings from Last 3 Encounters:  06/16/20 260 lb (117.9 kg)  05/16/20 265 lb (120.2 kg)    04/11/20 264 lb 6.4 oz (119.9 kg)    Physical Exam Vitals and nursing note reviewed.  Constitutional:      General: He is not in acute distress.    Appearance: Normal appearance. He is not toxic-appearing.  HENT:     Head: Normocephalic and atraumatic.     Right Ear: External ear normal.     Left Ear: External ear normal.     Nose: Congestion present. No rhinorrhea.     Mouth/Throat:     Mouth: Mucous membranes are moist.     Pharynx: Oropharynx is clear.  Eyes:     General: No scleral icterus.    Extraocular Movements: Extraocular movements intact.  Cardiovascular:     Rate and Rhythm: Normal rate.     Comments: Unable to assess heart sounds via virtual visit. Pulmonary:     Effort: Pulmonary effort is normal. No respiratory distress.     Comments: Unable to assess lung sounds via virtual visit.  Patient talking in complete sentences.  No accessory muscle use. Abdominal:     Comments: Unable to assess bowel sounds via virtual visit.  Skin:    Coloration: Skin is not jaundiced or pale.     Findings: No erythema.  Neurological:     Mental Status: He is alert and oriented to person, place, and time.  Psychiatric:        Mood and Affect: Mood normal.        Behavior: Behavior normal.        Thought Content: Thought content normal.        Judgment: Judgment normal.     Results for orders placed or performed in visit on 05/14/20  Novel Coronavirus, NAA ([facility])   Specimen: Nasopharyngeal(NP) swabs in vial transport medium   Nasopharynge  Screenin  Result Value Ref Range   SARS-CoV-2, NAA Not Detected Not Detected  SARS-COV-2, NAA 2 DAY TAT   Nasopharynge  Screenin  Result Value Ref Range   SARS-CoV-2, NAA 2 DAY TAT Performed       Assessment & Plan:   Problem List Items Addressed This Visit      Respiratory   Acute non-recurrent pansinusitis - Primary    Acute, ongoing.  Given history of some improvement, then worsening symptoms, will initiate antibiotic  therapy.  To start amoxicillin 875 mg twice daily for 5 days.  Continue supportive care. Increase fluids. Encouraged acetaminophen as needed for fever/pain. Encouraged salt water gargling, chloraseptic spray and throat lozenges. Encouraged OTC Mucinex. Encouraged saline sinus flushes. Encouraged humidified air. Return to clinic if symptoms not improving by end of week.      Relevant Medications   amoxicillin (AMOXIL) 875 MG tablet       Follow up plan: Return if symptoms worsen or fail to improve.  Due to the catastrophic nature of the COVID-19 pandemic, this visit was completed via audio and visual contact via [employer] due to the restrictions of the COVID-19 pandemic. All issues as above were discussed and addressed. Physical exam was done as above through visual confirmation on [employer]. If it was felt that the patient should be evaluated in the office, they were directed there. The patient verbally consented to this visit."}  Location of the patient: home  Location of the provider: work  Those involved with this call:   Provider: Carnella Gu[place], DNP  CMA: Yvonna Alanis, [place] [facility]/Registration: Jill Side   Time spent on call: 15 minutes with patient face to face via video conference. More than 50% of this time was spent in counseling and coordination of care. 20 minutes total spent in review of patient's record and preparation of their chart.  I verified patient identity using two factors (patient name and date of birth). Patient consents verbally to being seen via telemedicine visit today.

## 2020-07-01 NOTE — Patient Instructions (Signed)
Sinusitis, Adult Sinusitis is inflammation of your sinuses. Sinuses are hollow spaces in the bones around your face. Your sinuses are located:  Around your eyes.  In the middle of your forehead.  Behind your nose.  In your cheekbones. Mucus normally drains out of your sinuses. When your nasal tissues become inflamed or swollen, mucus can become trapped or blocked. This allows bacteria, viruses, and fungi to grow, which leads to infection. Most infections of the sinuses are caused by a virus. Sinusitis can develop quickly. It can last for up to 4 weeks (acute) or for more than 12 weeks (chronic). Sinusitis often develops after a cold. What are the causes? This condition is caused by anything that creates swelling in the sinuses or stops mucus from draining. This includes:  Allergies.  Asthma.  Infection from bacteria or viruses.  Deformities or blockages in your nose or sinuses.  Abnormal growths in the nose (nasal polyps).  Pollutants, such as chemicals or irritants in the air.  Infection from fungi (rare). What increases the risk? You are more likely to develop this condition if you:  Have a weak body defense system (immune system).  Do a lot of swimming or diving.  Overuse nasal sprays.  Smoke. What are the signs or symptoms? The main symptoms of this condition are pain and a feeling of pressure around the affected sinuses. Other symptoms include:  Stuffy nose or congestion.  Thick drainage from your nose.  Swelling and warmth over the affected sinuses.  Headache.  Upper toothache.  A cough that may get worse at night.  Extra mucus that collects in the throat or the back of the nose (postnasal drip).  Decreased sense of smell and taste.  Fatigue.  A fever.  Sore throat.  Bad breath. How is this diagnosed? This condition is diagnosed based on:  Your symptoms.  Your medical history.  A physical exam.  Tests to find out if your condition is  acute or chronic. This may include: ? Checking your nose for nasal polyps. ? Viewing your sinuses using a device that has a light (endoscope). ? Testing for allergies or bacteria. ? Imaging tests, such as an MRI or CT scan. In rare cases, a bone biopsy may be done to rule out more serious types of fungal sinus disease. How is this treated? Treatment for sinusitis depends on the cause and whether your condition is chronic or acute.  If caused by a virus, your symptoms should go away on their own within 10 days. You may be given medicines to relieve symptoms. They include: ? Medicines that shrink swollen nasal passages (topical intranasal decongestants). ? Medicines that treat allergies (antihistamines). ? A spray that eases inflammation of the nostrils (topical intranasal corticosteroids). ? Rinses that help get rid of thick mucus in your nose (nasal saline washes).  If caused by bacteria, your health care provider may recommend waiting to see if your symptoms improve. Most bacterial infections will get better without antibiotic medicine. You may be given antibiotics if you have: ? A severe infection. ? A weak immune system.  If caused by narrow nasal passages or nasal polyps, you may need to have surgery. Follow these instructions at home: Medicines  Take, use, or apply over-the-counter and prescription medicines only as told by your health care provider. These may include nasal sprays.  If you were prescribed an antibiotic medicine, take it as told by your health care provider. Do not stop taking the antibiotic even if you start   to feel better. Hydrate and humidify   Drink enough fluid to keep your urine pale yellow. Staying hydrated will help to thin your mucus.  Use a cool mist humidifier to keep the humidity level in your home above 50%.  Inhale steam for 10-15 minutes, 3-4 times a day, or as told by your health care provider. You can do this in the bathroom while a hot shower is  running.  Limit your exposure to cool or dry air. Rest  Rest as much as possible.  Sleep with your head raised (elevated).  Make sure you get enough sleep each night. General instructions   Apply a warm, moist washcloth to your face 3-4 times a day or as told by your health care provider. This will help with discomfort.  Wash your hands often with soap and water to reduce your exposure to germs. If soap and water are not available, use hand sanitizer.  Do not smoke. Avoid being around people who are smoking (secondhand smoke).  Keep all follow-up visits as told by your health care provider. This is important. Contact a health care provider if:  You have a fever.  Your symptoms get worse.  Your symptoms do not improve within 10 days. Get help right away if:  You have a severe headache.  You have persistent vomiting.  You have severe pain or swelling around your face or eyes.  You have vision problems.  You develop confusion.  Your neck is stiff.  You have trouble breathing. Summary  Sinusitis is soreness and inflammation of your sinuses. Sinuses are hollow spaces in the bones around your face.  This condition is caused by nasal tissues that become inflamed or swollen. The swelling traps or blocks the flow of mucus. This allows bacteria, viruses, and fungi to grow, which leads to infection.  If you were prescribed an antibiotic medicine, take it as told by your health care provider. Do not stop taking the antibiotic even if you start to feel better.  Keep all follow-up visits as told by your health care provider. This is important. This information is not intended to replace advice given to you by your health care provider. Make sure you discuss any questions you have with your health care provider. Document Revised: 01/02/2018 Document Reviewed: 01/02/2018 Elsevier Patient Education  2020 Elsevier Inc.  

## 2020-07-01 NOTE — Assessment & Plan Note (Signed)
Acute, ongoing.  Given history of some improvement, then worsening symptoms, will initiate antibiotic therapy.  To start amoxicillin 875 mg twice daily for 5 days.  Continue supportive care. Increase fluids. Encouraged acetaminophen as needed for fever/pain. Encouraged salt water gargling, chloraseptic spray and throat lozenges. Encouraged OTC Mucinex. Encouraged saline sinus flushes. Encouraged humidified air. Return to clinic if symptoms not improving by end of week.

## 2020-07-07 ENCOUNTER — Ambulatory Visit: Payer: PPO | Admitting: Pharmacist

## 2020-07-07 DIAGNOSIS — I10 Essential (primary) hypertension: Secondary | ICD-10-CM

## 2020-07-07 DIAGNOSIS — N401 Enlarged prostate with lower urinary tract symptoms: Secondary | ICD-10-CM

## 2020-07-07 DIAGNOSIS — R338 Other retention of urine: Secondary | ICD-10-CM

## 2020-07-07 NOTE — Progress Notes (Signed)
Chronic Care Management Pharmacy  Name: Derrick Sandoval.  MRN: [id number] DOB: 01-28-1956   Chief Complaint/ HPI  Derrick Ball.,  64 y.o. , male presents for their Initial CCM visit with the clinical pharmacist via telephone.  PCP : Derrick Sandoval Patient Care Team: Derrick Sandoval as PCP - General (Family Medicine) Delgaizo, Derrick Paganini, MD as Referring Physician (Orthopedic Surgery) Viprakasit, Marlowe Shores, MD as Referring Physician (Urology) Derrick Sandoval, [facility] (Pharmacist)  Their chronic conditions include: Hypertension, Hyperlipidemia, GERD, Osteoarthritis, BPH, Allergic Rhinitis and fatty liver disease   Office Visits: 05/16/20- Derrick Chapel, NP- recurrent sinusitis- Augmentin 875 mg bid x 5d, guaifenesin, BP 130/92 600 mg bid 04/11/20- [name]- RUQ pain- [place] ABD mild gallbladder sludge, fatty liver, EKG NSR 65 bpm, BP 143/88, naproxen and flexeril  Consult Visit: 02/28/20- [name], Urology- BP 14/85- non-obstructive urinary retention, TUIP 2015, considering sacral neuromodulation, tadalafil 5 mg qd, intermittent catheterizatiuon tid  No Known Allergies  Medications: Outpatient Encounter Medications as of 07/07/2020  Medication Sig  . acyclovir (ZOVIRAX) 400 MG tablet Take 1 tablet by mouth twice daily  . albuterol (VENTOLIN HFA) 108 (90 Base) MCG/ACT inhaler Inhale 1-2 puffs into the lungs every 6 (six) hours as needed for wheezing or shortness of breath.  . [EXPIRED] amoxicillin (AMOXIL) 875 MG tablet Take 1 tablet (875 mg total) by mouth every 12 (twelve) hours for 5 days.  . Ascorbic Acid (VITAMIN C) 1000 MG tablet Take 1,000 mg by mouth at bedtime.   Marland Kitchen aspirin EC 81 MG tablet Take 81 mg by mouth daily.  . cholecalciferol (VITAMIN D) 1000 UNITS tablet Take 1,000 Units by mouth daily.   . cyclobenzaprine (FLEXERIL) 10 MG tablet Take 1 tablet (10 mg total) by mouth 3 (three) times daily as needed for muscle spasms.  . famotidine (PEPCID) 20 MG  tablet Take 1 tablet by mouth twice daily  . fluticasone (FLONASE) 50 MCG/ACT nasal spray Place 1 spray into both nostrils 2 (two) times daily.  Marland Kitchen guaiFENesin (MUCINEX) 600 MG 12 hr tablet Take 1 tablet (600 mg total) by mouth 2 (two) times daily as needed for cough or to loosen phlegm.  . hydrOXYzine (ATARAX/VISTARIL) 50 MG tablet Take 1-2 tablets (50-100 mg total) by mouth 3 (three) times daily as needed.  Marland Kitchen lisinopril (ZESTRIL) 20 MG tablet Take 1 tablet by mouth once daily  . loratadine (CLARITIN) 10 MG tablet Take 1 tablet (10 mg total) by mouth daily.  Marland Kitchen lubiprostone (AMITIZA) 8 MCG capsule Take 1 capsule (8 mcg total) by mouth 2 (two) times daily with a meal.  . montelukast (SINGULAIR) 10 MG tablet TAKE 1 TABLET BY MOUTH AT BEDTIME  . Multiple Vitamins-Minerals (MULTIVITAMIN WITH MINERALS) tablet Take 1 tablet by mouth daily.  . naproxen (NAPROSYN) 500 MG tablet Take 1 tablet (500 mg total) by mouth 2 (two) times daily with a meal.  . nystatin cream (MYCOSTATIN) Apply 1 application topically 2 (two) times daily.  Marland Kitchen omeprazole (PRILOSEC) 40 MG capsule Take 1 capsule by mouth once daily  . tadalafil (CIALIS) 5 MG tablet Take 1 tablet (5 mg total) by mouth daily as needed for erectile dysfunction.  . triamcinolone ointment (KENALOG) 0.1 % Apply 1 application topically 2 (two) times daily.   No facility-administered encounter medications on file as of 07/07/2020.    Wt Readings from Last 3 Encounters:  06/16/20 260 lb (117.9 kg)  05/16/20 265 lb (120.2 kg)  04/11/20 264  lb 6.4 oz (119.9 kg)    Current Diagnosis/Assessment:    Goals Addressed   None     Hypertension   BP goal is:  <130/80  Office blood pressures are  BP Readings from Last 3 Encounters:  07/01/20 132/88  05/16/20 (!) 130/92  04/25/20 130/84   Patient checks BP at home infrequently Patient home BP readings are ranging: 130-140s/86- 100  Patient has failed these meds in the past: amlodipine Patient is  currently query controlled on the following medications:  . Lisinopril 20 mg qd   We discussed : Diet and exercise including limiting sodium, processed and fast food. Patient reports he previously took another agent for his BP but does not recall the name. He states he expericenced some low BP and this was discontinued. Chart review shows previous amlodipine therapy. Counseled patient on the importance of SMBP and proper techinique. Encouraged him to check 2-3 times weekly. He reports taking Allegra-D for help with sinus congestion.  Counseled him to avoid pseudoephedrine containing products as these can increase BP.  Plan  Continue current medications .     Allergic Rhinitis   Patient has failed these meds in past: NA Patient is currently query  controlled on the following medications:  Marland Kitchen Montelukast 10 mg qpm . Loratadine 10 mg qd . Fluticasone nasal spray twice daily  We discussed:  Counseled patient on avoiding duplicate antihistamines especially those with "D" or pseudoephedrine. He states he did not notice much relief with guaifenesin. He denies any missed doses.   Plan  Continue current medications  Osteoarthritis   Patient has failed these meds in past: NA Patient is currently query  controlled on the following medications:   Naproxen sodium 220 mg hs prn  Hydroxyzine 50-100 mg tid prn   We discussed:  Patient reports he takes naproxen ~ three times weekly for pain. He uses for hydroxyzine as needed when pain increases in his shoulder. Reviewed signs and symptoms of bleeding. Patient denies.   Plan  Continue current medications  GERD   Patient has failed these meds in past: NA Patient is currently controlled on the following medications:  . Omeprazole 40 mg qam . Famotidine 40 mg qpm  We discussed:  Dietary and lifestyle modifications. Patient reports good control on above regimen.  Plan  Continue current medications  Non-obstructive urinary retention    Patient has failed these meds in past: Tamsulosin Patient is currently query controlled on the following medications:  . Tadalafil  5 mg every day  We discussed:  Patient follows with urology. Next appointment in December. He self caths three times daily and is considering sacral neuromodulation inmplan device. Patient discontinued tamsulosin stating tadalfil worked better for symptoms. He reports this medication is expensive and would like to pursue PAP.  Plan  Continue current medications.  Constipation?   Patient has failed these meds in past: NA Patient is currently controlled on the following medications:  . Amitiza 79mcg bid   We discussed:  Patient denies a diagnosis of IBS constipation predominant. States Baker Pierini works well but he is concerned about cost. He would like to pursue PAP. Will initiate the process.  Plan  Continue current medications   Vaccines   Reviewed and discussed patient's vaccination history.    Immunization History  Administered Date(s) Administered  . Influenza,inj,Quad PF,6+ Mos 05/12/2015, 04/29/2016, 05/31/2017, 04/21/2018, 05/04/2019  . Influenza-Unspecified 07/28/2015, 05/04/2019, 05/20/2020  . Moderna SARS-COVID-2 Vaccination 03/29/2020, 04/26/2020  . PFIZER SARS-COV-2 Vaccination 11/09/2019, 11/30/2019  .  Td 01/23/2016  . Tdap 01/05/2012, 10/08/2017  . Zoster Recombinat (Shingrix) 05/04/2019, 07/13/2019    Plan  Patient received 2021 Flu vaccine at his pharmacy.    Medication Management   Patient's preferred pharmacy is:  [facility] [place] [place] [address] Phone: [phone] Fax: [phone]  [facility] [address] ([address]), [place] - [place] [address] Phone: [phone] Fax: [phone]  [facility]) - [place], [place] [address] Phone: [phone] Fax: [place], [place] - [place] [address] Phone: [phone] Fax: [phone]   Pt endorses 95 % compliance   Plan  Continue current medication management strategy. Will discuss     Follow up: 3  month phone visit  Junita Push. Kenton Kingfisher PharmD, [facility] [phone]

## 2020-07-07 NOTE — Patient Instructions (Addendum)
Visit Information  It was a pleasure speaking with you today! Thank you for letting me be a part of your care team. Please call with any questions or concerns.  Goals Addressed   None     Derrick Sandoval was given information about Chronic Care Management services today including:  1. CCM service includes personalized support from designated clinical staff supervised by his physician, including individualized plan of care and coordination with other care providers 2. 24/7 contact phone numbers for assistance for urgent and routine care needs. 3. Standard insurance, coinsurance, copays and deductibles apply for chronic care management only during months in which we provide at least 20 minutes of these services. Most insurances cover these services at 100%, however patients may be responsible for any copay, coinsurance and/or deductible if applicable. This service may help you avoid the need for more expensive face-to-face services. 4. Only one practitioner may furnish and bill the service in a calendar month. 5. The patient may stop CCM services at any time (effective at the end of the month) by phone call to the office staff.  Patient agreed to services and verbal consent obtained.   The patient verbalized understanding of instructions, educational materials, and care plan provided today and agreed to receive a mailed copy of patient instructions, educational materials, and care plan.  Telephone follow up appointment with pharmacy team member scheduled for: 3 months  Junita Push. Henefer PharmD, BCPS Clinical Pharmacist [phone]  Gastroesophageal Reflux Disease, Adult Gastroesophageal reflux (GER) happens when acid from the stomach flows up into the tube that connects the mouth and the stomach (esophagus). Normally, food travels down the esophagus and stays in the stomach to be digested. With GER, food and stomach acid sometimes move back up into the esophagus. You may have a disease called  gastroesophageal reflux disease (GERD) if the reflux:  Happens often.  Causes frequent or very bad symptoms.  Causes problems such as damage to the esophagus. When this happens, the esophagus becomes sore and swollen (inflamed). Over time, GERD can make small holes (ulcers) in the lining of the esophagus. What are the causes? This condition is caused by a problem with the muscle between the esophagus and the stomach. When this muscle is weak or not normal, it does not close properly to keep food and acid from coming back up from the stomach. The muscle can be weak because of:  Tobacco use.  Pregnancy.  Having a certain type of hernia (hiatal hernia).  Alcohol use.  Certain foods and drinks, such as coffee, chocolate, onions, and peppermint. What increases the risk? You are more likely to develop this condition if you:  Are overweight.  Have a disease that affects your connective tissue.  Use NSAID medicines. What are the signs or symptoms? Symptoms of this condition include:  Heartburn.  Difficult or painful swallowing.  The feeling of having a lump in the throat.  A bitter taste in the mouth.  Bad breath.  Having a lot of saliva.  Having an upset or bloated stomach.  Belching.  Chest pain. Different conditions can cause chest pain. Make sure you see your doctor if you have chest pain.  Shortness of breath or noisy breathing (wheezing).  Ongoing (chronic) cough or a cough at night.  Wearing away of the surface of teeth (tooth enamel).  Weight loss. How is this treated? Treatment will depend on how bad your symptoms are. Your doctor may suggest:  Changes to your diet.  Medicine.  Surgery.  Follow these instructions at home: Eating and drinking   Follow a diet as told by your doctor. You may need to avoid foods and drinks such as: ? Coffee and tea (with or without caffeine). ? Drinks that contain alcohol. ? Energy drinks and sports drinks. ? Bubbly  (carbonated) drinks or sodas. ? Chocolate and cocoa. ? Peppermint and mint flavorings. ? Garlic and onions. ? Horseradish. ? Spicy and acidic foods. These include peppers, chili powder, curry powder, vinegar, hot sauces, and BBQ sauce. ? Citrus fruit juices and citrus fruits, such as oranges, lemons, and limes. ? Tomato-based foods. These include red sauce, chili, salsa, and pizza with red sauce. ? Fried and fatty foods. These include donuts, french fries, potato chips, and high-fat dressings. ? High-fat meats. These include hot dogs, rib eye steak, sausage, ham, and bacon. ? High-fat dairy items, such as whole milk, butter, and cream cheese.  Eat small meals often. Avoid eating large meals.  Avoid drinking large amounts of liquid with your meals.  Avoid eating meals during the 2-3 hours before bedtime.  Avoid lying down right after you eat.  Do not exercise right after you eat. Lifestyle   Do not use any products that contain nicotine or tobacco. These include cigarettes, e-cigarettes, and chewing tobacco. If you need help quitting, ask your doctor.  Try to lower your stress. If you need help doing this, ask your doctor.  If you are overweight, lose an amount of weight that is healthy for you. Ask your doctor about a safe weight loss goal. General instructions  Pay attention to any changes in your symptoms.  Take over-the-counter and prescription medicines only as told by your doctor. Do not take aspirin, ibuprofen, or other NSAIDs unless your doctor says it is okay.  Wear loose clothes. Do not wear anything tight around your waist.  Raise (elevate) the head of your bed about 6 inches (15 cm).  Avoid bending over if this makes your symptoms worse.  Keep all follow-up visits as told by your doctor. This is important. Contact a doctor if:  You have new symptoms.  You lose weight and you do not know why.  You have trouble swallowing or it hurts to swallow.  You have  wheezing or a cough that keeps happening.  Your symptoms do not get better with treatment.  You have a hoarse voice. Get help right away if:  You have pain in your arms, neck, jaw, teeth, or back.  You feel sweaty, dizzy, or light-headed.  You have chest pain or shortness of breath.  You throw up (vomit) and your throw-up looks like blood or coffee grounds.  You pass out (faint).  Your poop (stool) is bloody or black.  You cannot swallow, drink, or eat. Summary  If a person has gastroesophageal reflux disease (GERD), food and stomach acid move back up into the esophagus and cause symptoms or problems such as damage to the esophagus.  Treatment will depend on how bad your symptoms are.  Follow a diet as told by your doctor.  Take all medicines only as told by your doctor. This information is not intended to replace advice given to you by your health care provider. Make sure you discuss any questions you have with your health care provider. Document Revised: 02/08/2018 Document Reviewed: 02/08/2018 Elsevier Patient Education  [place].

## 2020-07-11 ENCOUNTER — Encounter: Payer: Self-pay | Admitting: Family Medicine

## 2020-07-11 ENCOUNTER — Other Ambulatory Visit: Payer: Self-pay | Admitting: Family Medicine

## 2020-07-21 ENCOUNTER — Encounter: Payer: Self-pay | Admitting: Nurse Practitioner

## 2020-07-21 ENCOUNTER — Ambulatory Visit: Payer: Self-pay | Admitting: Nurse Practitioner

## 2020-07-21 NOTE — Telephone Encounter (Signed)
Patient message was in [facility] pool.   Please contact patient to address.

## 2020-08-03 ENCOUNTER — Other Ambulatory Visit: Payer: Self-pay | Admitting: Family Medicine

## 2020-08-03 NOTE — Telephone Encounter (Signed)
Requested Prescriptions  Pending Prescriptions Disp Refills  . lisinopril (ZESTRIL) 20 MG tablet [Pharmacy Med Name: Lisinopril 20 MG Oral Tablet] 90 tablet 0    Sig: Take 1 tablet by mouth once daily     Cardiovascular:  ACE Inhibitors Failed - 08/03/2020  5:51 PM      Failed - Cr in normal range and within 180 days    Creatinine  Date Value Ref Range Status  01/18/2012 1.22 0.60 - 1.30 mg/dL Final   Creatinine, Ser  Date Value Ref Range Status  07/23/2019 1.01 0.76 - 1.27 mg/dL Final         Failed - K in normal range and within 180 days    Potassium  Date Value Ref Range Status  07/23/2019 4.4 3.5 - 5.2 mmol/L Final  01/18/2012 3.7 3.5 - 5.1 mmol/L Final         Passed - Patient is not pregnant      Passed - Last BP in normal range    BP Readings from Last 1 Encounters:  07/01/20 132/88         Passed - Valid encounter within last 6 months    Recent Outpatient Visits          1 month ago Acute non-recurrent pansinusitis   [facility] Eulogio Bear, NP   2 months ago Acute non-recurrent maxillary sinusitis   [facility] Eulogio Bear, NP   3 months ago RUQ pain   [place], [place], DO   3 months ago RUQ pain   [place], [place], DO   4 months ago Seasonal allergic rhinitis due to pollen   [facility], Barb Merino, DO      Future Appointments            In 1 month Hollice Espy, MD [place]   In 10 months  [facility]           . tadalafil (CIALIS) 5 MG tablet [Pharmacy Med Name: Tadalafil 5 MG Oral Tablet] 90 tablet 0    Sig: TAKE 1 TABLET BY MOUTH ONCE DAILY AS NEEDED FOR ERECTILE DYSFUNCTION     Urology: Erectile Dysfunction Agents Passed - 08/03/2020  5:51 PM      Passed - Last BP in normal range    BP Readings from Last 1 Encounters:  07/01/20 132/88         Passed - Valid encounter within last 12  months    Recent Outpatient Visits          1 month ago Acute non-recurrent pansinusitis   [facility] Eulogio Bear, NP   2 months ago Acute non-recurrent maxillary sinusitis   [facility] Eulogio Bear, NP   3 months ago RUQ pain   [place], [place], DO   3 months ago RUQ pain   [place], Megan P, DO   4 months ago Seasonal allergic rhinitis due to pollen   [facility], Barb Merino, DO      Future Appointments            In 1 month Hollice Espy, MD [place]   In 10 months  [facility], [place]

## 2020-08-05 ENCOUNTER — Telehealth: Payer: Self-pay | Admitting: Pharmacist

## 2020-08-05 NOTE — Progress Notes (Signed)
Chronic Care Management Pharmacy Assistant   Name: Derrick Sandoval.  MRN: [id number] DOB: [birth year]  Reason for Encounter: Patient Assistance Application    PCP : Valerie Roys, DO  Allergies:  No Known Allergies  Medications: Outpatient Encounter Medications as of 08/05/2020  Medication Sig  . acyclovir (ZOVIRAX) 400 MG tablet Take 1 tablet by mouth twice daily  . albuterol (VENTOLIN HFA) 108 (90 Base) MCG/ACT inhaler Inhale 1-2 puffs into the lungs every 6 (six) hours as needed for wheezing or shortness of breath.  . Ascorbic Acid (VITAMIN C) 1000 MG tablet Take 1,000 mg by mouth at bedtime.   Marland Kitchen aspirin EC 81 MG tablet Take 81 mg by mouth daily.  . cholecalciferol (VITAMIN D) 1000 UNITS tablet Take 1,000 Units by mouth daily.   . cyclobenzaprine (FLEXERIL) 10 MG tablet Take 1 tablet (10 mg total) by mouth 3 (three) times daily as needed for muscle spasms.  . famotidine (PEPCID) 20 MG tablet Take 1 tablet by mouth twice daily  . fluticasone (FLONASE) 50 MCG/ACT nasal spray Place 1 spray into both nostrils 2 (two) times daily.  Marland Kitchen guaiFENesin (MUCINEX) 600 MG 12 hr tablet Take 1 tablet (600 mg total) by mouth 2 (two) times daily as needed for cough or to loosen phlegm.  . hydrOXYzine (ATARAX/VISTARIL) 50 MG tablet Take 1-2 tablets (50-100 mg total) by mouth 3 (three) times daily as needed.  Marland Kitchen lisinopril (ZESTRIL) 20 MG tablet Take 1 tablet by mouth once daily  . loratadine (CLARITIN) 10 MG tablet Take 1 tablet (10 mg total) by mouth daily.  Marland Kitchen lubiprostone (AMITIZA) 8 MCG capsule Take 1 capsule (8 mcg total) by mouth 2 (two) times daily with a meal.  . montelukast (SINGULAIR) 10 MG tablet TAKE 1 TABLET BY MOUTH AT BEDTIME  . Multiple Vitamins-Minerals (MULTIVITAMIN WITH MINERALS) tablet Take 1 tablet by mouth daily.  . naproxen (NAPROSYN) 500 MG tablet Take 1 tablet (500 mg total) by mouth 2 (two) times daily with a meal. (Patient not taking: Reported on 07/07/2020)  .  naproxen sodium (ALEVE) 220 MG tablet Take 220 mg by mouth daily as needed.  . nystatin cream (MYCOSTATIN) Apply 1 application topically 2 (two) times daily.  Marland Kitchen omeprazole (PRILOSEC) 40 MG capsule Take 1 capsule by mouth once daily  . tadalafil (CIALIS) 5 MG tablet TAKE 1 TABLET BY MOUTH ONCE DAILY AS NEEDED FOR ERECTILE DYSFUNCTION  . triamcinolone ointment (KENALOG) 0.1 % Apply 1 application topically 2 (two) times daily.   No facility-administered encounter medications on file as of 08/05/2020.    Current Diagnosis: Patient Active Problem List   Diagnosis Date Noted  . Acute non-recurrent pansinusitis 07/01/2020  . Tension headache 09/18/2019  . Shingles 02/19/2019  . Gastric mass   . Genital herpes 01/23/2016  . Skin lesion of left arm 08/16/2015  . Nail abnormality 08/04/2015  . Short-term memory loss 08/04/2015  . Dysphagia 05/12/2015  . Vitamin D deficiency 05/12/2015  . Blood in the stool 05/12/2015  . GERD (gastroesophageal reflux disease) 01/29/2015  . OA (osteoarthritis) of knee 01/29/2015  . Fatty liver 01/28/2015  . Left ventricular hypertrophy 01/28/2015  . Morbid obesity ([place]) 01/28/2015  . Rhinitis, allergic 01/28/2015  . Elevated transaminase level 01/28/2015  . Hyperlipidemia 01/28/2015  . Benign hypertension 01/28/2015  . Benign prostatic hyperplasia with lower urinary tract symptoms 01/28/2015    Goals Addressed   None    08-05-2020: Called the patient and explained his patient assistance applications  were needing his signature and proof of income. Explained to the patient that he could come by the PCP office front desk on 08-06-20 after 9 AM to provide his signature and proof of income. The patient verbalized understanding of information.   Cloretta Ned, LPN Clinical Pharmacist Assistant  [phone]    Follow-Up:  Patient Assistance Coordination

## 2020-08-11 ENCOUNTER — Other Ambulatory Visit: Payer: Self-pay | Admitting: Family Medicine

## 2020-08-14 ENCOUNTER — Other Ambulatory Visit: Payer: Self-pay | Admitting: Family Medicine

## 2020-08-19 ENCOUNTER — Other Ambulatory Visit: Payer: Self-pay

## 2020-08-19 ENCOUNTER — Ambulatory Visit (INDEPENDENT_AMBULATORY_CARE_PROVIDER_SITE_OTHER): Payer: PPO | Admitting: Physician Assistant

## 2020-08-19 ENCOUNTER — Encounter: Payer: Self-pay | Admitting: Physician Assistant

## 2020-08-19 VITALS — BP 167/93 | HR 76 | Ht 68.0 in | Wt 272.0 lb

## 2020-08-19 DIAGNOSIS — R300 Dysuria: Secondary | ICD-10-CM | POA: Diagnosis not present

## 2020-08-19 DIAGNOSIS — R8290 Unspecified abnormal findings in urine: Secondary | ICD-10-CM

## 2020-08-19 DIAGNOSIS — R3989 Other symptoms and signs involving the genitourinary system: Secondary | ICD-10-CM | POA: Diagnosis not present

## 2020-08-19 LAB — URINALYSIS, COMPLETE
Bilirubin, UA: NEGATIVE
Glucose, UA: NEGATIVE
Ketones, UA: NEGATIVE
Nitrite, UA: NEGATIVE
Specific Gravity, UA: 1.025 (ref 1.005–1.030)
Urobilinogen, Ur: 0.2 mg/dL (ref 0.2–1.0)
pH, UA: 5.5 (ref 5.0–7.5)

## 2020-08-19 LAB — MICROSCOPIC EXAMINATION
RBC, Urine: NONE SEEN /hpf (ref 0–2)
WBC, UA: >30 /hpf — ABNORMAL HIGH (ref 0–5)

## 2020-08-19 NOTE — Progress Notes (Signed)
08/19/2020 10:38 AM   Derrick Sandoval. [birth year] [id number]  CC: Chief Complaint  Derrick Sandoval presents with  . Dysuria   HPI: Derrick Sandoval. is a 65 y.o. male with PMH BPH s/p TURP on Flomax and finasteride, remote history of urethral stricture disease with resolution on cystoscopy in 2019, and urinary retention managed by [facility] 3 times daily who presents today for evaluation of possible UTI.    Derrick Sandoval has been seen both in our clinic and by [facility] urology and was previously interested in pursuing possible sacral neuromodulation with Dr. Raoul Pitch.  Derrick Sandoval reports Derrick Sandoval has been unable to schedule the procedure to date.   Today Derrick Sandoval reports an approximate 2-week history of cloudy urine with 1 episode of malodorous urine.  Derrick Sandoval states over the past month Derrick Sandoval has had chills, has been getting angry easily, and has had low appetite.  Derrick Sandoval denies dysuria, fever, vomiting, or genital edema or redness.  Most notably, Derrick Sandoval states Derrick Sandoval has new intermittent pain with catheterization associated with increased difficulty/resistance with passing the catheter into his bladder.   Derrick Sandoval was prescribed Augmentin by telehealth 2 days ago for management of a possible sinus infection but has not yet started this medication.  In-office UA today positive for trace intact blood, trace protein, and 2+ leukocyte esterase; urine microscopy with >30 WBCs/HPF.  PMH: Past Medical History:  Diagnosis Date  . Allergy   . Arthritis   . Cough    nonproductive no fever saw at dr 05-26-18  . GERD (gastroesophageal reflux disease)   . Hyperlipidemia   . Hypertension   . Meningitis due to viruses 1968    Surgical History: Past Surgical History:  Procedure Laterality Date  . COLONOSCOPY WITH PROPOFOL N/A 05/12/2018   Procedure: COLONOSCOPY WITH PROPOFOL;  Surgeon: Wyline Mood, MD;  Location: [facility] ENDOSCOPY;  Service: Gastroenterology;  Laterality: N/A;  . ESOPHAGOGASTRODUODENOSCOPY (EGD) WITH PROPOFOL N/A 05/12/2018   Procedure:  ESOPHAGOGASTRODUODENOSCOPY (EGD) WITH PROPOFOL;  Surgeon: Wyline Mood, MD;  Location[facility] ENDOSCOPY;  Service: Gastroenterology;  Laterality: N/A;  . ESOPHAGOGASTRODUODENOSCOPY (EGD) WITH PROPOFOL N/A 06/01/2018   Procedure: ESOPHAGOGASTRODUODENOSCOPY (EGD) WITH PROPOFOL;  Surgeon: Rachael Fee, MD;  Location: [facility] ENDOSCOPY;  Service: Endoscopy;  Laterality: N/A;  . EUS N/A 06/01/2018   Procedure: UPPER ENDOSCOPIC ULTRASOUND (EUS) RADIAL;  Surgeon: Rachael Fee, MD;  Location: [facility] ENDOSCOPY;  Service: Endoscopy;  Laterality: N/A;  . JOINT REPLACEMENT Bilateral   . NASAL SINUS SURGERY    . TOTAL KNEE ARTHROPLASTY      Home Medications:  Allergies as of 08/19/2020   No Known Allergies     Medication List       Accurate as of August 19, 2020 10:38 AM. If you have any questions, ask your nurse or doctor.        acyclovir 400 MG tablet Commonly known as: ZOVIRAX Take 1 tablet by mouth twice daily   albuterol 108 (90 Base) MCG/ACT inhaler Commonly known as: VENTOLIN HFA Inhale 1-2 puffs into the lungs every 6 (six) hours as needed for wheezing or shortness of breath.   aspirin EC 81 MG tablet Take 81 mg by mouth daily.   cholecalciferol 1000 units tablet Commonly known as: VITAMIN D Take 1,000 Units by mouth daily.   cyclobenzaprine 10 MG tablet Commonly known as: FLEXERIL Take 1 tablet (10 mg total) by mouth 3 (three) times daily as needed for muscle spasms.   famotidine 20 MG tablet Commonly known as: PEPCID Take 1  tablet by mouth twice daily   fluticasone 50 MCG/ACT nasal spray Commonly known as: FLONASE Place 1 spray into both nostrils 2 (two) times daily.   guaiFENesin 600 MG 12 hr tablet Commonly known as: Mucinex Take 1 tablet (600 mg total) by mouth 2 (two) times daily as needed for cough or to loosen phlegm.   hydrOXYzine 50 MG tablet Commonly known as: ATARAX/VISTARIL Take 1-2 tablets (50-100 mg total) by mouth 3 (three) times daily as needed.    lisinopril 20 MG tablet Commonly known as: ZESTRIL Take 1 tablet by mouth once daily   loratadine 10 MG tablet Commonly known as: CLARITIN Take 1 tablet (10 mg total) by mouth daily.   lubiprostone 8 MCG capsule Commonly known as: Amitiza Take 1 capsule (8 mcg total) by mouth 2 (two) times daily with a meal.   montelukast 10 MG tablet Commonly known as: SINGULAIR TAKE 1 TABLET BY MOUTH AT BEDTIME   multivitamin with minerals tablet Take 1 tablet by mouth daily.   naproxen 500 MG tablet Commonly known as: Naprosyn Take 1 tablet (500 mg total) by mouth 2 (two) times daily with a meal.   naproxen sodium 220 MG tablet Commonly known as: ALEVE Take 220 mg by mouth daily as needed.   nystatin cream Commonly known as: MYCOSTATIN Apply 1 application topically 2 (two) times daily.   omeprazole 40 MG capsule Commonly known as: PRILOSEC Take 1 capsule by mouth once daily   tadalafil 5 MG tablet Commonly known as: CIALIS TAKE 1 TABLET BY MOUTH ONCE DAILY AS NEEDED FOR ERECTILE DYSFUNCTION   triamcinolone ointment 0.1 % Commonly known as: KENALOG Apply 1 application topically 2 (two) times daily.   vitamin C 1000 MG tablet Take 1,000 mg by mouth at bedtime.       Allergies:  No Known Allergies  Family History: Family History  Problem Relation Age of Onset  . Breast cancer Mother   . Cancer Father        unsure, passed away before it was confirmed  . Pancreatic cancer Daughter   . Diabetes Neg Hx   . Heart disease Neg Hx   . Hypertension Neg Hx   . Stroke Neg Hx   . COPD Neg Hx   . Colon cancer Neg Hx     Social History:   reports that Derrick Sandoval has never smoked. Derrick Sandoval has never used smokeless tobacco. Derrick Sandoval reports previous alcohol use. Derrick Sandoval reports that Derrick Sandoval does not use drugs.  Physical Exam: BP (!) 167/93 (BP Location: Left Arm, Derrick Sandoval Position: Sitting, Cuff Size: Large)   Pulse 76   Ht 5\' 8"  (1.727 m)   Wt 272 lb (123.4 kg)   BMI 41.36 kg/m   Constitutional:   Alert and oriented, no acute distress, nontoxic appearing HEENT: [place], AT Cardiovascular: No clubbing, cyanosis, or edema Respiratory: Normal respiratory effort, no increased work of breathing Skin: No rashes, bruises or suspicious lesions Neurologic: Grossly intact, no focal deficits, moving all 4 extremities Psychiatric: Normal mood and affect  Laboratory Data: Results for orders placed or performed in visit on 08/19/20  Microscopic Examination   Urine  Result Value Ref Range   WBC, UA >30 (H) 0 - 5 /hpf   RBC None seen 0 - 2 /hpf   Epithelial Cells (non renal) 0-10 0 - 10 /hpf   Bacteria, UA Few (A) None seen/Few  Urinalysis, Complete  Result Value Ref Range   Specific Gravity, UA 1.025 1.005 - 1.030   pH,  UA 5.5 5.0 - 7.5   Color, UA Yellow Yellow   Appearance Ur Cloudy (A) Clear   Leukocytes,UA 2+ (A) Negative   Protein,UA Trace (A) Negative/Trace   Glucose, UA Negative Negative   Ketones, UA Negative Negative   RBC, UA Trace (A) Negative   Bilirubin, UA Negative Negative   Urobilinogen, Ur 0.2 0.2 - 1.0 mg/dL   Nitrite, UA Negative Negative   Microscopic Examination See below:    Assessment & Plan:   1. Cloudy urine UA today notable for pyuria, consistent with inflammation secondary to self-catheterization.  I reassured the Derrick Sandoval today that cloudy urine alone is not necessarily an indicator of infection and that there is no indication to treat based on this symptom alone.  Derrick Sandoval expressed understanding.  We will send his urine for culture and treat as indicated due to plans for cystoscopy as below. - Urinalysis, Complete - CULTURE, URINE COMPREHENSIVE  2. Urethral pain Coupled with increased difficulty/resistance with passing his CIC catheters, suspect possible urethral stricture recurrence and recommend cystoscopy for further evaluation.  Return in about 3 weeks (around 09/09/2020) for Cystoscopy with Dr. Erlene Quan.   I spent 36 minutes on the day of the encounter to  include pre-visit record review, face-to-face time with the Derrick Sandoval, and post-visit ordering of tests.   Debroah Loop, PA-C  [facility] [address], [place], [place] [address] [phone]

## 2020-08-19 NOTE — Patient Instructions (Signed)
I will send your urine for culture today and call you if we need to start an antibiotic. In the meantime, start the Augmentin you were prescribed yesterday.  I'm concerned your urethral stricture may have come back. I'd like you to return to clinic for a cystoscopy with Dr. Apolinar Junes later this month to check for this.  Please call or send a message to Dr. Raoul Pitch one more time to check the status on undergoing a procedure for your bladder.

## 2020-08-20 DIAGNOSIS — N35919 Unspecified urethral stricture, male, unspecified site: Secondary | ICD-10-CM | POA: Diagnosis not present

## 2020-08-20 DIAGNOSIS — N401 Enlarged prostate with lower urinary tract symptoms: Secondary | ICD-10-CM | POA: Diagnosis not present

## 2020-08-20 DIAGNOSIS — N319 Neuromuscular dysfunction of bladder, unspecified: Secondary | ICD-10-CM | POA: Diagnosis not present

## 2020-08-20 DIAGNOSIS — R339 Retention of urine, unspecified: Secondary | ICD-10-CM | POA: Diagnosis not present

## 2020-08-23 LAB — CULTURE, URINE COMPREHENSIVE

## 2020-08-29 ENCOUNTER — Telehealth: Payer: Self-pay

## 2020-08-29 NOTE — Telephone Encounter (Signed)
Received a cath refill order form from [facility] for patient's self catheters. Order form states intermittent catheter coude with or with out hydrophilic coating and sterile jelly packs and lubricant. Contacted patient to clarify and see which catheters he has been receiving and which he prefers. Patient states he was started on Coloplast by our office but then was asked by [facility] to try a different kind and was sent samples. He states these did not work well for him and he prefers the [employer]. He expressed this to [facility] and was then sent the coloplast at his next refill. He would like to continue with the Coloplast speedi cath coude with hydrophilic coating. This was changed and noted on the cath refill form, as well as stopping the sterile jelly and lubricant order. Patient does not need this with the hydrophilic catheters. Patient will call us back if there is an issue with [facility] in the future with filling his order incorrectly or attempting to change with out patient's request.

## 2020-09-09 ENCOUNTER — Other Ambulatory Visit: Payer: Self-pay | Admitting: Family Medicine

## 2020-09-09 MED ORDER — TADALAFIL 5 MG PO TABS: ORAL_TABLET | ORAL | 3 refills | Status: DC

## 2020-09-10 ENCOUNTER — Other Ambulatory Visit: Payer: Self-pay | Admitting: Urology

## 2020-09-11 ENCOUNTER — Other Ambulatory Visit: Payer: Self-pay | Admitting: Family Medicine

## 2020-09-11 NOTE — Telephone Encounter (Signed)
Please schedule a follow up appointment.

## 2020-09-11 NOTE — Telephone Encounter (Signed)
Requested medication (s) are due for refill today: yes  Requested medication (s) are on the active medication list: yes  Last refill:  04/11/20 #30  Future visit scheduled: yes  Notes to clinic:  Please review for refill. Refill not delegated per protocol    Requested Prescriptions  Pending Prescriptions Disp Refills   cyclobenzaprine (FLEXERIL) 10 MG tablet [Pharmacy Med Name: CYCLOBENZAPR 10MG    TAB] 30 tablet 0    Sig: Take 1 tablet by mouth three times daily as needed for muscle spasm      Not Delegated - Analgesics:  Muscle Relaxants Failed - 09/11/2020  3:36 PM      Failed - This refill cannot be delegated      Passed - Valid encounter within last 6 months    Recent Outpatient Visits           2 months ago Acute non-recurrent pansinusitis   [facility] Eulogio Bear, NP   3 months ago Acute non-recurrent maxillary sinusitis   [facility] Eulogio Bear, NP   4 months ago RUQ pain   [place], [place], DO   5 months ago RUQ pain   [place], [place], DO   6 months ago Seasonal allergic rhinitis due to pollen   [facility], Barb Merino, DO       Future Appointments             In 3 weeks Hollice Espy, MD [place]   In 9 months  [facility], [place]

## 2020-09-12 ENCOUNTER — Telehealth: Payer: Self-pay | Admitting: *Deleted

## 2020-09-12 NOTE — Telephone Encounter (Signed)
Pt dropped off a letter from help at hand for [place]. Missing items needed to proceed. Left in [place] to be reviewed.

## 2020-09-12 NOTE — Telephone Encounter (Signed)
Requested info previously faxed 09/03/20 and again today.

## 2020-09-12 NOTE — Telephone Encounter (Signed)
Unable to lvm to make apt °

## 2020-09-15 NOTE — Telephone Encounter (Signed)
Pt stated he did not want to schedule at this time. Pt sated he would call back to make this apt.

## 2020-09-15 NOTE — Telephone Encounter (Signed)
FYI Unable to done message due to unsigned orders.

## 2020-09-16 ENCOUNTER — Ambulatory Visit (INDEPENDENT_AMBULATORY_CARE_PROVIDER_SITE_OTHER): Payer: PPO | Admitting: Nurse Practitioner

## 2020-09-16 ENCOUNTER — Other Ambulatory Visit: Payer: Self-pay

## 2020-09-16 ENCOUNTER — Encounter: Payer: Self-pay | Admitting: Nurse Practitioner

## 2020-09-16 ENCOUNTER — Encounter: Payer: Self-pay | Admitting: Family Medicine

## 2020-09-16 VITALS — BP 134/85 | HR 71 | Temp 98.3°F | Wt 278.0 lb

## 2020-09-16 DIAGNOSIS — M47812 Spondylosis without myelopathy or radiculopathy, cervical region: Secondary | ICD-10-CM

## 2020-09-16 MED ORDER — CYCLOBENZAPRINE HCL 10 MG PO TABS
10.0000 mg | ORAL_TABLET | Freq: Three times a day (TID) | ORAL | 0 refills | Status: DC | PRN
Start: 2020-09-16 — End: 2020-10-07

## 2020-09-16 NOTE — Progress Notes (Signed)
Established Patient Office Visit  Subjective:  Patient ID: Derrick Statler., male    DOB: [birth year]  Age: 65 y.o. MRN: [id number]  CC:  Chief Complaint  Patient presents with  . Shoulder Pain    HPI Derrick Sandoval. presents for bilateral shoulder pain across neck. Has a history of this shoulder and neck pain in the past and has received steroid injections with relief. Has also had flexeril in the past which helped.   SHOULDER PAIN  Duration: days -- 2 days Involved shoulder: bilateral Mechanism of injury: unknown Location: posterior Onset:gradual Severity: 7/10  Quality:  "just hurts" and tightness Frequency: constant Radiation: no Aggravating factors: movement  Alleviating factors: ice, APAP, NSAIDs and rest Status: worse Treatments attempted: rest, ice, heat, APAP and ibuprofen  Relief with NSAIDs?:  mild --helps for a while, but then comes back Weakness: no Numbness: no Decreased grip strength: no Redness: no Swelling: no Bruising: no Fevers: no  Past Medical History:  Diagnosis Date  . Allergy   . Arthritis   . Cough    nonproductive no fever saw at dr 05-26-18  . GERD (gastroesophageal reflux disease)   . Hyperlipidemia   . Hypertension   . Meningitis due to viruses 1968    Past Surgical History:  Procedure Laterality Date  . COLONOSCOPY WITH PROPOFOL N/A 05/12/2018   Procedure: COLONOSCOPY WITH PROPOFOL;  Surgeon: Jonathon Bellows, MD;  Location: [facility] ENDOSCOPY;  Service: Gastroenterology;  Laterality: N/A;  . ESOPHAGOGASTRODUODENOSCOPY (EGD) WITH PROPOFOL N/A 05/12/2018   Procedure: ESOPHAGOGASTRODUODENOSCOPY (EGD) WITH PROPOFOL;  Surgeon: Jonathon Bellows, MD;  Location: [facility] ENDOSCOPY;  Service: Gastroenterology;  Laterality: N/A;  . ESOPHAGOGASTRODUODENOSCOPY (EGD) WITH PROPOFOL N/A 06/01/2018   Procedure: ESOPHAGOGASTRODUODENOSCOPY (EGD) WITH PROPOFOL;  Surgeon: Milus Banister, MD;  Location: [facility] ENDOSCOPY;  Service: Endoscopy;  Laterality: N/A;  .  EUS N/A 06/01/2018   Procedure: UPPER ENDOSCOPIC ULTRASOUND (EUS) RADIAL;  Surgeon: Milus Banister, MD;  Location: [facility] ENDOSCOPY;  Service: Endoscopy;  Laterality: N/A;  . JOINT REPLACEMENT Bilateral   . NASAL SINUS SURGERY    . TOTAL KNEE ARTHROPLASTY      Family History  Problem Relation Age of Onset  . Breast cancer Mother   . Cancer Father        unsure, passed away before it was confirmed  . Pancreatic cancer Daughter   . Diabetes Neg Hx   . Heart disease Neg Hx   . Hypertension Neg Hx   . Stroke Neg Hx   . COPD Neg Hx   . Colon cancer Neg Hx     Social History   Socioeconomic History  . Marital status: Married    Spouse name: Not on file  . Number of children: Not on file  . Years of education: 10th grade, GED  . Highest education level: GED or equivalent  Occupational History  . Occupation: disabled   Tobacco Use  . Smoking status: Never Smoker  . Smokeless tobacco: Never Used  Vaping Use  . Vaping Use: Never used  Substance and Sexual Activity  . Alcohol use: Not Currently    Alcohol/week: 0.0 standard drinks    Comment: occasionally, twice a year   . Drug use: No  . Sexual activity: Not Currently  Other Topics Concern  . Not on file  Social History Narrative   [employer] 3x a week    Social Determinants of Health   Financial Resource Strain: Medium Risk  . Difficulty of Paying Living  Expenses: Somewhat hard  Food Insecurity: Not on file  Transportation Needs: No Transportation Needs  . Lack of Transportation (Medical): No  . Lack of Transportation (Non-Medical): No  Physical Activity: Inactive  . Days of Exercise per Week: 0 days  . Minutes of Exercise per Session: 0 min  Stress: Not on file  Social Connections: Not on file  Intimate Partner Violence: Not on file    Outpatient Medications Prior to Visit  Medication Sig Dispense Refill  . acyclovir (ZOVIRAX) 400 MG tablet Take 1 tablet by mouth twice daily 180 tablet 0  . albuterol (VENTOLIN  HFA) 108 (90 Base) MCG/ACT inhaler Inhale 1-2 puffs into the lungs every 6 (six) hours as needed for wheezing or shortness of breath. 18 g 3  . Ascorbic Acid (VITAMIN C) 1000 MG tablet Take 1,000 mg by mouth at bedtime.     Marland Kitchen aspirin EC 81 MG tablet Take 81 mg by mouth daily.    . cholecalciferol (VITAMIN D) 1000 UNITS tablet Take 1,000 Units by mouth daily.     . famotidine (PEPCID) 20 MG tablet Take 1 tablet by mouth twice daily 180 tablet 0  . fluticasone (FLONASE) 50 MCG/ACT nasal spray Place 1 spray into both nostrils 2 (two) times daily. 48 g 12  . guaiFENesin (MUCINEX) 600 MG 12 hr tablet Take 1 tablet (600 mg total) by mouth 2 (two) times daily as needed for cough or to loosen phlegm. 30 tablet 0  . lisinopril (ZESTRIL) 20 MG tablet Take 1 tablet by mouth once daily 90 tablet 0  . loratadine (CLARITIN) 10 MG tablet Take 1 tablet (10 mg total) by mouth daily. 30 tablet 11  . lubiprostone (AMITIZA) 8 MCG capsule Take 1 capsule (8 mcg total) by mouth 2 (two) times daily with a meal. 180 capsule 0  . montelukast (SINGULAIR) 10 MG tablet TAKE 1 TABLET BY MOUTH AT BEDTIME 90 tablet 1  . Multiple Vitamins-Minerals (MULTIVITAMIN WITH MINERALS) tablet Take 1 tablet by mouth daily.    . naproxen (NAPROSYN) 500 MG tablet Take 1 tablet (500 mg total) by mouth 2 (two) times daily with a meal. 60 tablet 1  . naproxen sodium (ALEVE) 220 MG tablet Take 220 mg by mouth daily as needed.    . nystatin cream (MYCOSTATIN) Apply 1 application topically 2 (two) times daily. 30 g 3  . omeprazole (PRILOSEC) 40 MG capsule Take 1 capsule by mouth once daily 90 capsule 2  . tadalafil (CIALIS) 5 MG tablet TAKE 1 TABLET BY MOUTH ONCE DAILY AS NEEDED FOR BPH 90 tablet 3  . triamcinolone ointment (KENALOG) 0.1 % Apply 1 application topically 2 (two) times daily.    . hydrOXYzine (ATARAX/VISTARIL) 50 MG tablet Take 1-2 tablets (50-100 mg total) by mouth 3 (three) times daily as needed. (Patient not taking: Reported on  09/16/2020) 180 tablet 3  . cyclobenzaprine (FLEXERIL) 10 MG tablet Take 1 tablet (10 mg total) by mouth 3 (three) times daily as needed for muscle spasms. (Patient not taking: Reported on 09/16/2020) 30 tablet 0   No facility-administered medications prior to visit.    No Known Allergies  ROS Review of Systems  Constitutional: Positive for fatigue.  HENT: Negative.   Respiratory: Negative.   Cardiovascular: Negative.   Musculoskeletal: Positive for neck pain and neck stiffness.  Neurological: Negative for dizziness, light-headedness and numbness.  Psychiatric/Behavioral: Negative.      Objective:    Physical Exam Vitals and nursing note reviewed.  Constitutional:  Appearance: Normal appearance.  HENT:     Head: Normocephalic and atraumatic.  Cardiovascular:     Rate and Rhythm: Normal rate and regular rhythm.     Pulses: Normal pulses.     Heart sounds: Normal heart sounds.  Pulmonary:     Effort: Pulmonary effort is normal.     Breath sounds: Normal breath sounds.  Musculoskeletal:     Right shoulder: No swelling, deformity or tenderness (with palpation). Normal range of motion (tenderness with ROM).     Left shoulder: No swelling, deformity or tenderness (with palpation). Normal range of motion (tenderness with ROM).     Right upper arm: Normal.     Left upper arm: Normal.     Cervical back: Normal range of motion.     Comments: Full range of motion to neck and bilateral shoulders. Pain and tightness with ROM. Strength 5+ in bilateral arms. Sensation intact.   Skin:    General: Skin is warm and dry.  Neurological:     General: No focal deficit present.     Mental Status: He is alert and oriented to person, place, and time.    BP 134/85 (BP Location: Left Arm, Cuff Size: Normal)   Pulse 71   Temp 98.3 F (36.8 C) (Oral)   Wt 278 lb (126.1 kg)   SpO2 96%   BMI 42.27 kg/m  Wt Readings from Last 3 Encounters:  09/16/20 278 lb (126.1 kg)  08/19/20 272 lb (123.4  kg)  06/16/20 260 lb (117.9 kg)    Lab Results  Component Value Date   TSH 2.940 07/23/2019   Lab Results  Component Value Date   WBC 6.3 07/23/2019   HGB 16.1 07/23/2019   HCT 47.5 07/23/2019   MCV 92 07/23/2019   PLT 228 07/23/2019   Lab Results  Component Value Date   NA 139 07/23/2019   K 4.4 07/23/2019   CO2 21 07/23/2019   GLUCOSE 87 07/23/2019   BUN 15 07/23/2019   CREATININE 1.01 07/23/2019   BILITOT 0.3 07/23/2019   ALKPHOS 64 07/23/2019   AST 40 07/23/2019   ALT 57 (H) 07/23/2019   PROT 7.1 07/23/2019   ALBUMIN 4.4 07/23/2019   CALCIUM 9.4 07/23/2019   ANIONGAP 9 01/18/2012   Lab Results  Component Value Date   CHOL 182 07/23/2019   Lab Results  Component Value Date   HDL 41 07/23/2019   Lab Results  Component Value Date   LDLCALC 111 (H) 07/23/2019   Lab Results  Component Value Date   TRIG 169 (H) 07/23/2019   No results found for: CHOLHDL Lab Results  Component Value Date   HGBA1C 5.8 04/21/2018      Assessment & Plan:   Problem List Items Addressed This Visit      Musculoskeletal and Integument   Cervical spondylosis - Primary    X-rays from 07/23/19 reviewed and showed marked cervical spondylosis. No neurologic symptoms at this time. Continue alternating ibuprofen and tylenol as need for pain. Continue using heat/ice for comfort. Refill sent in for flexeril 10mg  TID PRN. Patient aware that this could make him sleepy and tired. Last year received Kenalog injections for bilateral shoulders with relief. He is not interested in any injections at this time. Pain and tightness more from neck and radiating to posterior shoulders. Will follow-up in 2 weeks and determine if referral needed at that time or sooner if worsening symptoms.        Relevant Medications  cyclobenzaprine (FLEXERIL) 10 MG tablet      Meds ordered this encounter  Medications  . cyclobenzaprine (FLEXERIL) 10 MG tablet    Sig: Take 1 tablet (10 mg total) by mouth 3  (three) times daily as needed for muscle spasms.    Dispense:  30 tablet    Refill:  0    Follow-up: Return in about 2 weeks (around 09/30/2020) for physical and follow-up neck/shoulder pain.    Charyl Dancer, NP

## 2020-09-16 NOTE — Assessment & Plan Note (Addendum)
X-rays from 07/23/19 reviewed and showed marked cervical spondylosis. No neurologic symptoms at this time. Continue alternating ibuprofen and tylenol as need for pain. Continue using heat/ice for comfort. Refill sent in for flexeril 10mg  TID PRN. Patient aware that this could make him sleepy and tired. Last year received Kenalog injections for bilateral shoulders with relief. He is not interested in any injections at this time. Pain and tightness more from neck and radiating to posterior shoulders. Will follow-up in 2 weeks and determine if referral needed at that time or sooner if worsening symptoms.

## 2020-09-16 NOTE — Patient Instructions (Addendum)
Continue ibuprofen, tylenol, heat/ice Will refill flexeril  Will follow-up in 2 weeks or sooner if symptoms worsen

## 2020-09-18 NOTE — Progress Notes (Addendum)
I saw and evaluated the above patient on 09/16/20.  The case was discussed on rounds with Vance Peper, DNP. I personally reviewed the HPI, PH, FH, SH, ROS and medications. I repeated pertinent portions of the examination and reviewed the relevant imaging and laboratory data. I agree with the findings, assessment and plan as documented.

## 2020-09-26 ENCOUNTER — Ambulatory Visit: Payer: Self-pay | Admitting: Urology

## 2020-10-03 ENCOUNTER — Other Ambulatory Visit
Admission: RE | Admit: 2020-10-03 | Discharge: 2020-10-03 | Disposition: A | Discharge status: Home / Self Care | Payer: PPO | Attending: Urology | Admitting: Urology

## 2020-10-03 ENCOUNTER — Other Ambulatory Visit: Payer: Self-pay

## 2020-10-03 ENCOUNTER — Ambulatory Visit (INDEPENDENT_AMBULATORY_CARE_PROVIDER_SITE_OTHER): Payer: PPO | Admitting: Urology

## 2020-10-03 ENCOUNTER — Encounter: Payer: Self-pay | Admitting: Urology

## 2020-10-03 VITALS — BP 156/91 | HR 80 | Ht 68.0 in | Wt 278.0 lb

## 2020-10-03 DIAGNOSIS — N5203 Combined arterial insufficiency and corporo-venous occlusive erectile dysfunction: Secondary | ICD-10-CM

## 2020-10-03 DIAGNOSIS — R339 Retention of urine, unspecified: Secondary | ICD-10-CM | POA: Diagnosis not present

## 2020-10-03 DIAGNOSIS — Z125 Encounter for screening for malignant neoplasm of prostate: Secondary | ICD-10-CM

## 2020-10-03 LAB — PSA: Prostatic Specific Antigen: 0.16 ng/mL (ref 0.00–4.00)

## 2020-10-03 NOTE — Patient Instructions (Signed)
Prostate Cancer Screening  Prostate cancer screening is a test that is done to check for the presence of prostate cancer in men. The prostate gland is a walnut-sized gland that is located below the bladder and in front of the rectum in males. The function of the prostate is to add fluid to semen during ejaculation. Prostate cancer is the second most common type of cancer in men. Who should have prostate cancer screening?  Screening recommendations vary based on age and other risk factors. Screening is recommended if:  You are older than age 55. If you are age 55-69, talk with your health care provider about your need for screening and how often screening should be done. Because most prostate cancers are slow growing and will not cause death, screening is generally reserved in this age group for men who have a 10-15-year life expectancy.  You are younger than age 55, and you have these risk factors: ? Being a black male or a male of African descent. ? Having a father, brother, or uncle who has been diagnosed with prostate cancer. The risk is higher if your family member's cancer occurred at an early age. Screening is not recommended if:  You are younger than age 40.  You are between the ages of 40 and 54 and you have no risk factors.  You are 70 years of age or older. At this age, the risks that screening can cause are greater than the benefits that it may provide. If you are at high risk for prostate cancer, your health care provider may recommend that you have screenings more often or that you start screening at a younger age. How is screening for prostate cancer done? The recommended prostate cancer screening test is a blood test called the prostate-specific antigen (PSA) test. PSA is a protein that is made in the prostate. As you age, your prostate naturally produces more PSA. Abnormally high PSA levels may be caused by:  Prostate cancer.  An enlarged prostate that is not caused by cancer  (benign prostatic hyperplasia, BPH). This condition is very common in older men.  A prostate gland infection (prostatitis). Depending on the PSA results, you may need more tests, such as:  A physical exam to check the size of your prostate gland.  Blood and imaging tests.  A procedure to remove tissue samples from your prostate gland for testing (biopsy). What are the benefits of prostate cancer screening?  Screening can help to identify cancer at an early stage, before symptoms start and when the cancer can be treated more easily.  There is a small chance that screening may lower your risk of dying from prostate cancer. The chance is small because prostate cancer is a slow-growing cancer, and most men with prostate cancer die from a different cause. What are the risks of prostate cancer screening? The main risk of prostate cancer screening is diagnosing and treating prostate cancer that would never have caused any symptoms or problems. This is called overdiagnosisand overtreatment. PSA screening cannot tell you if your PSA is high due to cancer or a different cause. A prostate biopsy is the only procedure to diagnose prostate cancer. Even the results of a biopsy may not tell you if your cancer needs to be treated. Slow-growing prostate cancer may not need any treatment other than monitoring, so diagnosing and treating it may cause unnecessary stress or other side effects. A prostate biopsy may also cause:  Infection or fever.  A false negative. This is   a result that shows that you do not have prostate cancer when you actually do have prostate cancer. Questions to ask your health care provider  When should I start prostate cancer screening?  What is my risk for prostate cancer?  How often do I need screening?  What type of screening tests do I need?  How do I get my test results?  What do my results mean?  Do I need treatment? Where to find more information  The American Cancer  Society: www.cancer.org  American Urological Association: www.auanet.org Contact a health care provider if:  You have difficulty urinating.  You have pain when you urinate or ejaculate.  You have blood in your urine or semen.  You have pain in your back or in the area of your prostate. Summary  Prostate cancer is a common type of cancer in men. The prostate gland is located below the bladder and in front of the rectum. This gland adds fluid to semen during ejaculation.  Prostate cancer screening may identify cancer at an early stage, when the cancer can be treated more easily.  The prostate-specific antigen (PSA) test is the recommended screening test for prostate cancer.  Discuss the risks and benefits of prostate cancer screening with your health care provider. If you are age 63 or older, the risks that screening can cause are greater than the benefits that it may provide. This information is not intended to replace advice given to you by your health care provider. Make sure you discuss any questions you have with your health care provider. Document Revised: 11/23/2019 Document Reviewed: 03/15/2019 Elsevier Patient Education  [place].

## 2020-10-03 NOTE — Progress Notes (Signed)
10/03/2020 10:19 AM   Derrick Sandoval. [birth year] [id number]  Referring provider: Valerie Roys, DO [place],  [place] [address]  Chief Complaint  Patient presents with  . Follow-up    HPI: 65 year old male returns today for his "annual" follow-up.  As requested by him.  Please see previous notes for details.  He has a personal history of a TURP and urethral stricture.  He is now being followed by [facility] urology where he is undergone urodynamics and is undergoing a cystoscopy in just a few weeks.  He is scheduled to have a InterStim device placed and will be following up with Dr. Westly Pam in the near future.  He caths 3 times a day.  We recently ordered his catheters which are 51 [place] which he uses 3 times a day.  He also voids spontaneously in between.  He has no other complaints today.  Is a little bit unclear why he is presenting today given that he has follow-up just next week with another urologist.  He reports that we are his "primary" urologist and they are his "specialty" urologist.  He is no longer on any BPH medications.  He is due for his PSA screening.  He does take daily Cialis both for erectile dysfunction as well as urinary symptoms.   PMH: Past Medical History:  Diagnosis Date  . Allergy   . Arthritis   . Cough    nonproductive no fever saw at dr 05-26-18  . GERD (gastroesophageal reflux disease)   . Hyperlipidemia   . Hypertension   . Meningitis due to viruses 1968    Surgical History: Past Surgical History:  Procedure Laterality Date  . COLONOSCOPY WITH PROPOFOL N/A 05/12/2018   Procedure: COLONOSCOPY WITH PROPOFOL;  Surgeon: Jonathon Bellows, MD;  Location: [facility] ENDOSCOPY;  Service: Gastroenterology;  Laterality: N/A;  . ESOPHAGOGASTRODUODENOSCOPY (EGD) WITH PROPOFOL N/A 05/12/2018   Procedure: ESOPHAGOGASTRODUODENOSCOPY (EGD) WITH PROPOFOL;  Surgeon: Jonathon Bellows, MD;  Location: [facility] ENDOSCOPY;  Service: Gastroenterology;  Laterality: N/A;   . ESOPHAGOGASTRODUODENOSCOPY (EGD) WITH PROPOFOL N/A 06/01/2018   Procedure: ESOPHAGOGASTRODUODENOSCOPY (EGD) WITH PROPOFOL;  Surgeon: Milus Banister, MD;  Location: [facility] ENDOSCOPY;  Service: Endoscopy;  Laterality: N/A;  . EUS N/A 06/01/2018   Procedure: UPPER ENDOSCOPIC ULTRASOUND (EUS) RADIAL;  Surgeon: Milus Banister, MD;  Location: [facility] ENDOSCOPY;  Service: Endoscopy;  Laterality: N/A;  . JOINT REPLACEMENT Bilateral   . NASAL SINUS SURGERY    . TOTAL KNEE ARTHROPLASTY      Home Medications:  Allergies as of 10/03/2020   No Known Allergies     Medication List       Accurate as of October 03, 2020 10:19 AM. If you have any questions, ask your nurse or doctor.        STOP taking these medications   hydrOXYzine 50 MG tablet Commonly known as: ATARAX/VISTARIL Stopped by: Hollice Espy, MD     TAKE these medications   acyclovir 400 MG tablet Commonly known as: ZOVIRAX Take 1 tablet by mouth twice daily   albuterol 108 (90 Base) MCG/ACT inhaler Commonly known as: VENTOLIN HFA Inhale 1-2 puffs into the lungs every 6 (six) hours as needed for wheezing or shortness of breath.   aspirin EC 81 MG tablet Take 81 mg by mouth daily.   cholecalciferol 1000 units tablet Commonly known as: VITAMIN D Take 1,000 Units by mouth daily.   cyclobenzaprine 10 MG tablet Commonly known as: FLEXERIL Take 1 tablet (10 mg  total) by mouth 3 (three) times daily as needed for muscle spasms.   famotidine 20 MG tablet Commonly known as: PEPCID Take 1 tablet by mouth twice daily   fluticasone 50 MCG/ACT nasal spray Commonly known as: FLONASE Place 1 spray into both nostrils 2 (two) times daily.   guaiFENesin 600 MG 12 hr tablet Commonly known as: Mucinex Take 1 tablet (600 mg total) by mouth 2 (two) times daily as needed for cough or to loosen phlegm.   lisinopril 20 MG tablet Commonly known as: ZESTRIL Take 1 tablet by mouth once daily   loratadine 10 MG tablet Commonly known as:  CLARITIN Take 1 tablet (10 mg total) by mouth daily.   lubiprostone 8 MCG capsule Commonly known as: Amitiza Take 1 capsule (8 mcg total) by mouth 2 (two) times daily with a meal.   montelukast 10 MG tablet Commonly known as: SINGULAIR TAKE 1 TABLET BY MOUTH AT BEDTIME   multivitamin with minerals tablet Take 1 tablet by mouth daily.   naproxen 500 MG tablet Commonly known as: Naprosyn Take 1 tablet (500 mg total) by mouth 2 (two) times daily with a meal.   naproxen sodium 220 MG tablet Commonly known as: ALEVE Take 220 mg by mouth daily as needed.   nystatin cream Commonly known as: MYCOSTATIN Apply 1 application topically 2 (two) times daily.   omeprazole 40 MG capsule Commonly known as: PRILOSEC Take 1 capsule by mouth once daily   tadalafil 5 MG tablet Commonly known as: CIALIS TAKE 1 TABLET BY MOUTH ONCE DAILY AS NEEDED FOR BPH   triamcinolone ointment 0.1 % Commonly known as: KENALOG Apply 1 application topically 2 (two) times daily.   vitamin C 1000 MG tablet Take 1,000 mg by mouth at bedtime.       Allergies: No Known Allergies  Family History: Family History  Problem Relation Age of Onset  . Breast cancer Mother   . Cancer Father        unsure, passed away before it was confirmed  . Pancreatic cancer Daughter   . Diabetes Neg Hx   . Heart disease Neg Hx   . Hypertension Neg Hx   . Stroke Neg Hx   . COPD Neg Hx   . Colon cancer Neg Hx     Social History:  reports that he has never smoked. He has never used smokeless tobacco. He reports previous alcohol use. He reports that he does not use drugs.   Physical Exam: BP (!) 156/91 (BP Location: Left Arm, Patient Position: Sitting, Cuff Size: Large)   Pulse 80   Ht 5\' 8"  (1.727 m)   Wt 278 lb (126.1 kg)   BMI 42.27 kg/m   Constitutional:  Alert and oriented, No acute distress. HEENT: [place] AT, moist mucus membranes.  Trachea midline, no masses. Cardiovascular: No clubbing, cyanosis, or  edema. Respiratory: Normal respiratory effort, no increased work of breathing. Skin: No rashes, bruises or suspicious lesions. Neurologic: Grossly intact, no focal deficits, moving all 4 extremities. Psychiatric: Normal mood and affect.  Laboratory Data: Lab Results  Component Value Date   WBC 6.3 07/23/2019   HGB 16.1 07/23/2019   HCT 47.5 07/23/2019   MCV 92 07/23/2019   PLT 228 07/23/2019    Lab Results  Component Value Date   CREATININE 1.01 07/23/2019    Lab Results  Component Value Date   HGBA1C 5.8 04/21/2018     Assessment & Plan:    1. 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 Westly Pam  As per previous discussions with the patient, it is likely in his best interest to follow with a single practice rather than multiple urologist there is some redundancy and also can lead to mismanagement  2. Screening PSA (prostate specific antigen) PSA has been low, due for PSA this year  We will defer rectal exam to next year - PSA; Future  3. Combined arterial insufficiency and corporo-venous occlusive erectile dysfunction Continue sildenafil 5 mg daily as needed  He does desire to continue this annually, will schedule the appropriate   Hollice Espy, MD  [facility] [address], [place], [place] [address] [phone]

## 2020-10-06 ENCOUNTER — Telehealth: Payer: Self-pay

## 2020-10-06 NOTE — Telephone Encounter (Signed)
-----   Message from Hollice Espy, MD sent at 10/06/2020  2:32 PM EST ----- PSA is stable and low

## 2020-10-06 NOTE — Telephone Encounter (Signed)
mychart notification sent 

## 2020-10-07 ENCOUNTER — Other Ambulatory Visit: Payer: Self-pay

## 2020-10-07 ENCOUNTER — Encounter: Payer: Self-pay | Admitting: Family Medicine

## 2020-10-07 ENCOUNTER — Ambulatory Visit (INDEPENDENT_AMBULATORY_CARE_PROVIDER_SITE_OTHER): Payer: PPO | Admitting: Family Medicine

## 2020-10-07 VITALS — BP 121/83 | HR 71 | Temp 98.9°F | Ht 67.25 in | Wt 274.0 lb

## 2020-10-07 DIAGNOSIS — N401 Enlarged prostate with lower urinary tract symptoms: Secondary | ICD-10-CM

## 2020-10-07 DIAGNOSIS — I10 Essential (primary) hypertension: Secondary | ICD-10-CM | POA: Diagnosis not present

## 2020-10-07 DIAGNOSIS — Z0000 Encounter for general adult medical examination without abnormal findings: Secondary | ICD-10-CM | POA: Diagnosis not present

## 2020-10-07 DIAGNOSIS — E782 Mixed hyperlipidemia: Secondary | ICD-10-CM

## 2020-10-07 DIAGNOSIS — N3000 Acute cystitis without hematuria: Secondary | ICD-10-CM

## 2020-10-07 DIAGNOSIS — R338 Other retention of urine: Secondary | ICD-10-CM

## 2020-10-07 DIAGNOSIS — E559 Vitamin D deficiency, unspecified: Secondary | ICD-10-CM

## 2020-10-07 LAB — MICROSCOPIC EXAMINATION
Cast Type: NONE SEEN
Casts: NONE SEEN /lpf
Crystal Type: NONE SEEN
Crystals: NONE SEEN
Mucus, UA: NONE SEEN
Renal Epithel, UA: NONE SEEN /hpf
Trichomonas, UA: NONE SEEN
WBC, UA: >30 /hpf — AB (ref 0–5)
Yeast, UA: NONE SEEN

## 2020-10-07 LAB — MICROALBUMIN, URINE WAIVED
Creatinine, Urine Waived: 100 mg/dL (ref 10–300)
Microalb, Ur Waived: 30 mg/L — ABNORMAL HIGH (ref 0–19)

## 2020-10-07 LAB — URINALYSIS, ROUTINE W REFLEX MICROSCOPIC
Bilirubin, UA: NEGATIVE
Glucose, UA: NEGATIVE
Ketones, UA: NEGATIVE
Nitrite, UA: POSITIVE — AB
Protein,UA: NEGATIVE
Specific Gravity, UA: 1.01 (ref 1.005–1.030)
Urobilinogen, Ur: 0.2 mg/dL (ref 0.2–1.0)
pH, UA: 5.5 (ref 5.0–7.5)

## 2020-10-07 MED ORDER — FAMOTIDINE 20 MG PO TABS: 20.0000 mg | ORAL_TABLET | Freq: Two times a day (BID) | ORAL | 1 refills | Status: DC

## 2020-10-07 MED ORDER — ALBUTEROL SULFATE HFA 108 (90 BASE) MCG/ACT IN AERS
1.0000 | INHALATION_SPRAY | Freq: Four times a day (QID) | RESPIRATORY_TRACT | 3 refills | Status: DC | PRN
Start: 2020-10-07 — End: 2021-02-20

## 2020-10-07 MED ORDER — CIPROFLOXACIN HCL 500 MG PO TABS: 500.0000 mg | ORAL_TABLET | Freq: Two times a day (BID) | ORAL | 0 refills | Status: DC

## 2020-10-07 MED ORDER — ACYCLOVIR 400 MG PO TABS: 400.0000 mg | ORAL_TABLET | Freq: Two times a day (BID) | ORAL | 1 refills | Status: DC

## 2020-10-07 MED ORDER — LUBIPROSTONE 8 MCG PO CAPS
8.0000 ug | ORAL_CAPSULE | Freq: Two times a day (BID) | ORAL | 1 refills | Status: DC
Start: 2020-10-07 — End: 2021-04-14

## 2020-10-07 MED ORDER — CYCLOBENZAPRINE HCL 10 MG PO TABS: 10.0000 mg | ORAL_TABLET | Freq: Three times a day (TID) | ORAL | 0 refills | Status: DC | PRN

## 2020-10-07 MED ORDER — MONTELUKAST SODIUM 10 MG PO TABS: 10.0000 mg | ORAL_TABLET | Freq: Every day | ORAL | 1 refills | Status: DC

## 2020-10-07 MED ORDER — LISINOPRIL 20 MG PO TABS: 20.0000 mg | ORAL_TABLET | Freq: Every day | ORAL | 1 refills | Status: DC

## 2020-10-07 NOTE — Progress Notes (Signed)
BP 121/83   Pulse 71   Temp 98.9 F (37.2 C)   Ht 5' 7.25" (1.708 m)   Wt 274 lb (124.3 kg)   SpO2 97%   BMI 42.60 kg/m    Subjective:    Patient ID: Derrick Sandoval., male    DOB: 03/16/1956, 65 y.o.   MRN: [id number]  HPI: Derrick Sandoval. is a 65 y.o. male presenting on 10/07/2020 for comprehensive medical examination. Current medical complaints include:  HYPERTENSION / HYPERLIPIDEMIA Satisfied with current treatment? yes Duration of hypertension: chronic BP monitoring frequency: not checking BP medication side effects: no Past BP meds: lisinopril Duration of hyperlipidemia: chronic Cholesterol medication side effects: no Cholesterol supplements: none Past cholesterol medications: none Medication compliance: excellent compliance Aspirin: no Recent stressors: no Recurrent headaches: no Visual changes: no Palpitations: no Dyspnea: no Chest pain: no Lower extremity edema: no Dizzy/lightheaded: no   URINARY SYMPTOMS Duration: a couple weeks Dysuria: burning Urinary frequency: yes Urgency: yes Small volume voids: yes Symptom severity: moderate Urinary incontinence: no Foul odor: yes Hematuria: no Abdominal pain: no Back pain: no Suprapubic pain/pressure: no Flank pain: no Fever:  no Vomiting: no Relief with cranberry juice: no Relief with pyridium: no Status: worse Previous urinary tract infection: yes Recurrent urinary tract infection: no Sexual activity: No sexually active History of sexually transmitted disease: no Penile discharge: no Treatments attempted: increasing fluids   He currently lives with: alone Interim Problems from his last visit: no  Depression Screen done today and results listed below:  Depression screen [facility] 2/9 10/07/2020 06/16/2020 09/18/2019 06/06/2019 11/24/2018  Decreased Interest 0 3 3 0 0  Down, Depressed, Hopeless 0 0 1 0 0  PHQ - 2 Score 0 3 4 0 0  Altered sleeping - 3 3 - 0  Tired, decreased energy - 3 3 - 0   Change in appetite - 1 3 - 0  Feeling bad or failure about yourself  - 1 0 - 0  Trouble concentrating - 3 0 - 1  Moving slowly or fidgety/restless - 0 0 - 0  Suicidal thoughts - 0 0 - 0  PHQ-9 Score - 14 13 - 1  Difficult doing work/chores - Somewhat difficult - - Not difficult at all    Past Medical History:  Past Medical History:  Diagnosis Date  . Allergy   . Arthritis   . Cough    nonproductive no fever saw at dr 05-26-18  . GERD (gastroesophageal reflux disease)   . Hyperlipidemia   . Hypertension   . Meningitis due to viruses 1968    Surgical History:  Past Surgical History:  Procedure Laterality Date  . COLONOSCOPY WITH PROPOFOL N/A 05/12/2018   Procedure: COLONOSCOPY WITH PROPOFOL;  Surgeon: Jonathon Bellows, MD;  Location: [facility] ENDOSCOPY;  Service: Gastroenterology;  Laterality: N/A;  . ESOPHAGOGASTRODUODENOSCOPY (EGD) WITH PROPOFOL N/A 05/12/2018   Procedure: ESOPHAGOGASTRODUODENOSCOPY (EGD) WITH PROPOFOL;  Surgeon: Jonathon Bellows, MD;  Location[facility] ENDOSCOPY;  Service: Gastroenterology;  Laterality: N/A;  . ESOPHAGOGASTRODUODENOSCOPY (EGD) WITH PROPOFOL N/A 06/01/2018   Procedure: ESOPHAGOGASTRODUODENOSCOPY (EGD) WITH PROPOFOL;  Surgeon: Milus Banister, MD;  Location: [facility] ENDOSCOPY;  Service: Endoscopy;  Laterality: N/A;  . EUS N/A 06/01/2018   Procedure: UPPER ENDOSCOPIC ULTRASOUND (EUS) RADIAL;  Surgeon: Milus Banister, MD;  Location: [facility] ENDOSCOPY;  Service: Endoscopy;  Laterality: N/A;  . JOINT REPLACEMENT Bilateral   . NASAL SINUS SURGERY    . TOTAL KNEE ARTHROPLASTY      Medications:  Current Outpatient Medications on File Prior to Visit  Medication Sig  . Ascorbic Acid (VITAMIN C) 1000 MG tablet Take 1,000 mg by mouth at bedtime.   Marland Kitchen aspirin EC 81 MG tablet Take 81 mg by mouth daily.  . cholecalciferol (VITAMIN D) 1000 UNITS tablet Take 1,000 Units by mouth daily.   . fluticasone (FLONASE) 50 MCG/ACT nasal spray Place 1 spray into both nostrils 2 (two) times  daily.  Marland Kitchen guaiFENesin (MUCINEX) 600 MG 12 hr tablet Take 1 tablet (600 mg total) by mouth 2 (two) times daily as needed for cough or to loosen phlegm.  . loratadine (CLARITIN) 10 MG tablet Take 1 tablet (10 mg total) by mouth daily.  . Multiple Vitamins-Minerals (MULTIVITAMIN WITH MINERALS) tablet Take 1 tablet by mouth daily.  . naproxen (NAPROSYN) 500 MG tablet Take 1 tablet (500 mg total) by mouth 2 (two) times daily with a meal.  . naproxen sodium (ALEVE) 220 MG tablet Take 220 mg by mouth daily as needed.  . nystatin cream (MYCOSTATIN) Apply 1 application topically 2 (two) times daily.  Marland Kitchen omeprazole (PRILOSEC) 40 MG capsule Take 1 capsule by mouth once daily  . tadalafil (CIALIS) 5 MG tablet TAKE 1 TABLET BY MOUTH ONCE DAILY AS NEEDED FOR BPH  . triamcinolone ointment (KENALOG) 0.1 % Apply 1 application topically 2 (two) times daily.   No current facility-administered medications on file prior to visit.    Allergies:  No Known Allergies  Social History:  Social History   Socioeconomic History  . Marital status: Married    Spouse name: Not on file  . Number of children: Not on file  . Years of education: 10th grade, GED  . Highest education level: GED or equivalent  Occupational History  . Occupation: disabled   Tobacco Use  . Smoking status: Never Smoker  . Smokeless tobacco: Never Used  Vaping Use  . Vaping Use: Never used  Substance and Sexual Activity  . Alcohol use: Not Currently    Alcohol/week: 0.0 standard drinks    Comment: occasionally, twice a year   . Drug use: No  . Sexual activity: Not Currently  Other Topics Concern  . Not on file  Social History Narrative   [employer] 3x a week    Social Determinants of Health   Financial Resource Strain: Medium Risk  . Difficulty of Paying Living Expenses: Somewhat hard  Food Insecurity: Not on file  Transportation Needs: No Transportation Needs  . Lack of Transportation (Medical): No  . Lack of Transportation  (Non-Medical): No  Physical Activity: Inactive  . Days of Exercise per Week: 0 days  . Minutes of Exercise per Session: 0 min  Stress: Not on file  Social Connections: Not on file  Intimate Partner Violence: Not on file   Social History   Tobacco Use  Smoking Status Never Smoker  Smokeless Tobacco Never Used   Social History   Substance and Sexual Activity  Alcohol Use Not Currently  . Alcohol/week: 0.0 standard drinks   Comment: occasionally, twice a year     Family History:  Family History  Problem Relation Age of Onset  . Breast cancer Mother   . Cancer Father        unsure, passed away before it was confirmed  . Pancreatic cancer Daughter   . Diabetes Neg Hx   . Heart disease Neg Hx   . Hypertension Neg Hx   . Stroke Neg Hx   . COPD Neg Hx   .  Colon cancer Neg Hx     Past medical history, surgical history, medications, allergies, family history and social history reviewed with patient today and changes made to appropriate areas of the chart.   Review of Systems  Constitutional: Positive for chills. Negative for diaphoresis, fever, malaise/fatigue and weight loss.  HENT: Negative.   Eyes: Negative.   Respiratory: Negative.   Cardiovascular: Negative.   Gastrointestinal: Positive for heartburn and nausea. Negative for abdominal pain, blood in stool, constipation, diarrhea, melena and vomiting.  Genitourinary: Positive for dysuria and urgency. Negative for flank pain, frequency and hematuria.  Musculoskeletal: Positive for joint pain. Negative for back pain, falls, myalgias and neck pain.  Skin: Negative.   Neurological: Negative.   Endo/Heme/Allergies: Positive for environmental allergies. Negative for polydipsia. Does not bruise/bleed easily.  Psychiatric/Behavioral: Negative.    All other ROS negative except what is listed above and in the HPI.      Objective:    BP 121/83   Pulse 71   Temp 98.9 F (37.2 C)   Ht 5' 7.25" (1.708 m)   Wt 274 lb (124.3  kg)   SpO2 97%   BMI 42.60 kg/m   Wt Readings from Last 3 Encounters:  10/07/20 274 lb (124.3 kg)  10/03/20 278 lb (126.1 kg)  09/16/20 278 lb (126.1 kg)    Physical Exam Vitals and nursing note reviewed.  Constitutional:      General: He is not in acute distress.    Appearance: Normal appearance. He is obese. He is not ill-appearing, toxic-appearing or diaphoretic.  HENT:     Head: Normocephalic and atraumatic.     Right Ear: Tympanic membrane, ear canal and external ear normal. There is no impacted cerumen.     Left Ear: Tympanic membrane, ear canal and external ear normal. There is no impacted cerumen.     Nose: Nose normal. No congestion or rhinorrhea.     Mouth/Throat:     Mouth: Mucous membranes are moist.     Pharynx: Oropharynx is clear. No oropharyngeal exudate or posterior oropharyngeal erythema.  Eyes:     General: No scleral icterus.       Right eye: No discharge.        Left eye: No discharge.     Extraocular Movements: Extraocular movements intact.     Conjunctiva/sclera: Conjunctivae normal.     Pupils: Pupils are equal, round, and reactive to light.  Neck:     Vascular: No carotid bruit.  Cardiovascular:     Rate and Rhythm: Normal rate and regular rhythm.     Pulses: Normal pulses.     Heart sounds: No murmur heard. No friction rub. No gallop.   Pulmonary:     Effort: Pulmonary effort is normal. No respiratory distress.     Breath sounds: Normal breath sounds. No stridor. No wheezing, rhonchi or rales.  Chest:     Chest wall: No tenderness.  Abdominal:     General: Abdomen is flat. Bowel sounds are normal. There is no distension.     Palpations: Abdomen is soft. There is no mass.     Tenderness: There is no abdominal tenderness. There is no right CVA tenderness, left CVA tenderness, guarding or rebound.     Hernia: No hernia is present.  Genitourinary:    Comments: Genital exam deferred with shared decision making, foley catheter in  place Musculoskeletal:        General: No swelling, tenderness, deformity or signs of injury.  Cervical back: Normal range of motion and neck supple. No rigidity. No muscular tenderness.     Right lower leg: No edema.     Left lower leg: No edema.  Lymphadenopathy:     Cervical: No cervical adenopathy.  Skin:    General: Skin is warm and dry.     Capillary Refill: Capillary refill takes less than 2 seconds.     Coloration: Skin is not jaundiced or pale.     Findings: No bruising, erythema, lesion or rash.  Neurological:     General: No focal deficit present.     Mental Status: He is alert and oriented to person, place, and time.     Cranial Nerves: No cranial nerve deficit.     Sensory: No sensory deficit.     Motor: No weakness.     Coordination: Coordination normal.     Gait: Gait normal.     Deep Tendon Reflexes: Reflexes normal.  Psychiatric:        Mood and Affect: Mood normal.        Behavior: Behavior normal.        Thought Content: Thought content normal.        Judgment: Judgment normal.     Results for orders placed or performed in visit on 10/07/20  Microscopic Examination   Urine  Result Value Ref Range   WBC, UA >30 (A) 0 - 5 /hpf   RBC 3-10 (A) 0 - 2 /hpf   Epithelial Cells (non renal) 0-10 0 - 10 /hpf   Renal Epithel, UA None seen None seen /hpf   Casts None seen None seen /lpf   Cast Type None seen N/A   Crystals None seen N/A   Crystal Type None seen N/A   Mucus, UA None seen Not Estab.   Bacteria, UA Many (A) None seen/Few   Yeast, UA None seen None seen   Trichomonas, UA None seen None seen  Comprehensive metabolic panel  Result Value Ref Range   Glucose 74 65 - 99 mg/dL   BUN 15 8 - 27 mg/dL   Creatinine, Ser 1.04 0.76 - 1.27 mg/dL   GFR calc non Af Amer 77 >59 mL/min/1.73   GFR calc Af Amer 89 >59 mL/min/1.73   BUN/Creatinine Ratio 14 10 - 24   Sodium 140 134 - 144 mmol/L   Potassium 4.4 3.5 - 5.2 mmol/L   Chloride 102 96 - 106 mmol/L    CO2 20 20 - 29 mmol/L   Calcium 9.5 8.6 - 10.2 mg/dL   Total Protein 7.0 6.0 - 8.5 g/dL   Albumin 4.7 3.8 - 4.8 g/dL   Globulin, Total 2.3 1.5 - 4.5 g/dL   Albumin/Globulin Ratio 2.0 1.2 - 2.2   Bilirubin Total 0.4 0.0 - 1.2 mg/dL   Alkaline Phosphatase 75 44 - 121 IU/L   AST 68 (H) 0 - 40 IU/L   ALT 83 (H) 0 - 44 IU/L  CBC with Differential/Platelet  Result Value Ref Range   WBC 7.6 3.4 - 10.8 x10E3/uL   RBC 5.56 4.14 - 5.80 x10E6/uL   Hemoglobin 16.9 13.0 - 17.7 g/dL   Hematocrit 49.0 37.5 - 51.0 %   MCV 88 79 - 97 fL   MCH 30.4 26.6 - 33.0 pg   MCHC 34.5 31.5 - 35.7 g/dL   RDW 13.3 11.6 - 15.4 %   Platelets 178 150 - 450 x10E3/uL   Neutrophils 47 Not Estab. %   Lymphs 38 Not Estab. %  Monocytes 11 Not Estab. %   Eos 3 Not Estab. %   Basos 1 Not Estab. %   Neutrophils Absolute 3.5 1.4 - 7.0 x10E3/uL   Lymphocytes Absolute 2.9 0.7 - 3.1 x10E3/uL   Monocytes Absolute 0.8 0.1 - 0.9 x10E3/uL   EOS (ABSOLUTE) 0.2 0.0 - 0.4 x10E3/uL   Basophils Absolute 0.1 0.0 - 0.2 x10E3/uL   Immature Granulocytes 0 Not Estab. %   Immature Grans (Abs) 0.0 0.0 - 0.1 x10E3/uL  Lipid Panel w/o Chol/HDL Ratio  Result Value Ref Range   Cholesterol, Total 160 100 - 199 mg/dL   Triglycerides 123 0 - 149 mg/dL   HDL 37 (L) >39 mg/dL   VLDL Cholesterol Cal 22 5 - 40 mg/dL   LDL Chol Calc (NIH) 101 (H) 0 - 99 mg/dL  PSA  Result Value Ref Range   Prostate Specific Ag, Serum 0.2 0.0 - 4.0 ng/mL  TSH  Result Value Ref Range   TSH 3.400 0.450 - 4.500 uIU/mL  Urinalysis, Routine w reflex microscopic  Result Value Ref Range   Specific Gravity, UA 1.010 1.005 - 1.030   pH, UA 5.5 5.0 - 7.5   Color, UA Yellow Yellow   Appearance Ur Cloudy (A) Clear   Leukocytes,UA 3+ (A) Negative   Protein,UA Negative Negative/Trace   Glucose, UA Negative Negative   Ketones, UA Negative Negative   RBC, UA 1+ (A) Negative   Bilirubin, UA Negative Negative   Urobilinogen, Ur 0.2 0.2 - 1.0 mg/dL   Nitrite, UA  Positive (A) Negative   Microscopic Examination See below:   VITAMIN D 25 Hydroxy (Vit-D Deficiency, Fractures)  Result Value Ref Range   Vit D, 25-Hydroxy 30.7 30.0 - 100.0 ng/mL  Microalbumin, Urine Waived  Result Value Ref Range   Microalb, Ur Waived 30 (H) 0 - 19 mg/L   Creatinine, Urine Waived 100 10 - 300 mg/dL   Microalb/Creat Ratio 30-300 (H) <30 mg/g      Assessment & Plan:   Problem List Items Addressed This Visit      Cardiovascular and Mediastinum   Benign hypertension    Under good control on current regimen. Continue current regimen. Continue to monitor. Call with any concerns. Refills given. Labs drawn today.        Relevant Medications   lisinopril (ZESTRIL) 20 MG tablet   Other Relevant Orders   Comprehensive metabolic panel (Completed)   CBC with Differential/Platelet (Completed)   TSH (Completed)   Urinalysis, Routine w reflex microscopic (Completed)   Microalbumin, Urine Waived (Completed)     Genitourinary   Benign prostatic hyperplasia with lower urinary tract symptoms    Under good control on current regimen. Continue current regimen. Continue to monitor. Call with any concerns. Refills given. Labs drawn today.        Relevant Orders   Comprehensive metabolic panel (Completed)   CBC with Differential/Platelet (Completed)   PSA (Completed)     Other   Morbid obesity ([place])    Encouraged diet and exercise. Goal of losing 1-2lbs per week. Call with any concerns.       Hyperlipidemia    Under good control on current regimen. Continue current regimen. Continue to monitor. Call with any concerns. Refills given. Labs drawn today.        Relevant Medications   lisinopril (ZESTRIL) 20 MG tablet   Other Relevant Orders   Comprehensive metabolic panel (Completed)   CBC with Differential/Platelet (Completed)   Lipid Panel w/o Chol/HDL Ratio (Completed)  Vitamin D deficiency    Rechecking labs today. Await results. Treat as needed.        Relevant Orders   Comprehensive metabolic panel (Completed)   CBC with Differential/Platelet (Completed)   VITAMIN D 25 Hydroxy (Vit-D Deficiency, Fractures) (Completed)    Other Visit Diagnoses    Routine general medical examination at a health care facility    -  Primary   Vaccines up to date. Screening labs checked today. Colonoscopy up to date. Cotninue diet and exercise. Call with any concerns.    Acute cystitis without hematuria       Will treat with cipro. Await culture. Call with any concerns.        Discussed aspirin prophylaxis for myocardial infarction prevention and decision was made to continue ASA  LABORATORY TESTING:  Health maintenance labs ordered today as discussed above.   The natural history of prostate cancer and ongoing controversy regarding screening and potential treatment outcomes of prostate cancer has been discussed with the patient. The meaning of a false positive PSA and a false negative PSA has been discussed. He indicates understanding of the limitations of this screening test and wishes to proceed with screening PSA testing.   IMMUNIZATIONS:   - Tdap: Tetanus vaccination status reviewed: last tetanus booster within 10 years. - Influenza: Up to date - Pneumovax: Refused - Prevnar: Not applicable - COVID: Up to date  SCREENING: - Colonoscopy: Up to date  Discussed with patient purpose of the colonoscopy is to detect colon cancer at curable precancerous or early stages   PATIENT COUNSELING:    Sexuality: Discussed sexually transmitted diseases, partner selection, use of condoms, avoidance of unintended pregnancy  and contraceptive alternatives.   Advised to avoid cigarette smoking.  I discussed with the patient that most people either abstain from alcohol or drink within safe limits (<=14/week and <=4 drinks/occasion for males, <=7/weeks and <= 3 drinks/occasion for females) and that the risk for alcohol disorders and other health effects rises  proportionally with the number of drinks per week and how often a drinker exceeds daily limits.  Discussed cessation/primary prevention of drug use and availability of treatment for abuse.   Diet: Encouraged to adjust caloric intake to maintain  or achieve ideal body weight, to reduce intake of dietary saturated fat and total fat, to limit sodium intake by avoiding high sodium foods and not adding table salt, and to maintain adequate dietary potassium and calcium preferably from fresh fruits, vegetables, and low-fat dairy products.    stressed the importance of regular exercise  Injury prevention: Discussed safety belts, safety helmets, smoke detector, smoking near bedding or upholstery.   Dental health: Discussed importance of regular tooth brushing, flossing, and dental visits.   Follow up plan: NEXT PREVENTATIVE PHYSICAL DUE IN 1 YEAR. Return in about 6 months (around 04/06/2021).

## 2020-10-07 NOTE — Patient Instructions (Signed)

## 2020-10-08 LAB — COMPREHENSIVE METABOLIC PANEL
ALT: 83 IU/L — ABNORMAL HIGH (ref 0–44)
AST: 68 IU/L — ABNORMAL HIGH (ref 0–40)
Albumin/Globulin Ratio: 2 (ref 1.2–2.2)
Albumin: 4.7 g/dL (ref 3.8–4.8)
Alkaline Phosphatase: 75 IU/L (ref 44–121)
BUN/Creatinine Ratio: 14 (ref 10–24)
BUN: 15 mg/dL (ref 8–27)
Bilirubin Total: 0.4 mg/dL (ref 0.0–1.2)
CO2: 20 mmol/L (ref 20–29)
Calcium: 9.5 mg/dL (ref 8.6–10.2)
Chloride: 102 mmol/L (ref 96–106)
Creatinine, Ser: 1.04 mg/dL (ref 0.76–1.27)
GFR calc Af Amer: 89 mL/min/{1.73_m2} (ref >59)
GFR calc non Af Amer: 77 mL/min/{1.73_m2} (ref >59)
Globulin, Total: 2.3 g/dL (ref 1.5–4.5)
Glucose: 74 mg/dL (ref 65–99)
Potassium: 4.4 mmol/L (ref 3.5–5.2)
Sodium: 140 mmol/L (ref 134–144)
Total Protein: 7 g/dL (ref 6.0–8.5)

## 2020-10-08 LAB — CBC WITH DIFFERENTIAL/PLATELET
Basophils Absolute: 0.1 10*3/uL (ref 0.0–0.2)
Basos: 1 %
EOS (ABSOLUTE): 0.2 10*3/uL (ref 0.0–0.4)
Eos: 3 %
Hematocrit: 49 % (ref 37.5–51.0)
Hemoglobin: 16.9 g/dL (ref 13.0–17.7)
Immature Grans (Abs): 0 10*3/uL (ref 0.0–0.1)
Immature Granulocytes: 0 %
Lymphocytes Absolute: 2.9 10*3/uL (ref 0.7–3.1)
Lymphs: 38 %
MCH: 30.4 pg (ref 26.6–33.0)
MCHC: 34.5 g/dL (ref 31.5–35.7)
MCV: 88 fL (ref 79–97)
Monocytes Absolute: 0.8 10*3/uL (ref 0.1–0.9)
Monocytes: 11 %
Neutrophils Absolute: 3.5 10*3/uL (ref 1.4–7.0)
Neutrophils: 47 %
Platelets: 178 10*3/uL (ref 150–450)
RBC: 5.56 x10E6/uL (ref 4.14–5.80)
RDW: 13.3 % (ref 11.6–15.4)
WBC: 7.6 10*3/uL (ref 3.4–10.8)

## 2020-10-08 LAB — TSH: TSH: 3.4 u[IU]/mL (ref 0.450–4.500)

## 2020-10-08 LAB — VITAMIN D 25 HYDROXY (VIT D DEFICIENCY, FRACTURES): Vit D, 25-Hydroxy: 30.7 ng/mL (ref 30.0–100.0)

## 2020-10-08 LAB — LIPID PANEL W/O CHOL/HDL RATIO
Cholesterol, Total: 160 mg/dL (ref 100–199)
HDL: 37 mg/dL — ABNORMAL LOW (ref >39)
LDL Chol Calc (NIH): 101 mg/dL — ABNORMAL HIGH (ref 0–99)
Triglycerides: 123 mg/dL (ref 0–149)
VLDL Cholesterol Cal: 22 mg/dL (ref 5–40)

## 2020-10-08 LAB — PSA: Prostate Specific Ag, Serum: 0.2 ng/mL (ref 0.0–4.0)

## 2020-10-11 NOTE — Assessment & Plan Note (Signed)
Rechecking labs today. Await results. Treat as needed.  °

## 2020-10-11 NOTE — Assessment & Plan Note (Signed)
Under good control on current regimen. Continue current regimen. Continue to monitor. Call with any concerns. Refills given. Labs drawn today.   

## 2020-10-11 NOTE — Assessment & Plan Note (Signed)
Encouraged diet and exercise. Goal of losing 1-2lbs per week. Call with any concerns.  

## 2020-10-14 ENCOUNTER — Telehealth: Payer: Self-pay | Admitting: Pharmacist

## 2020-10-15 ENCOUNTER — Ambulatory Visit (INDEPENDENT_AMBULATORY_CARE_PROVIDER_SITE_OTHER): Payer: PPO | Admitting: Pharmacist

## 2020-10-15 DIAGNOSIS — N401 Enlarged prostate with lower urinary tract symptoms: Secondary | ICD-10-CM

## 2020-10-15 DIAGNOSIS — I10 Essential (primary) hypertension: Secondary | ICD-10-CM | POA: Diagnosis not present

## 2020-10-15 DIAGNOSIS — R338 Other retention of urine: Secondary | ICD-10-CM

## 2020-10-15 NOTE — Progress Notes (Signed)
Open in error  Cloretta Ned, [place] Pharmacist Assistant  [phone]

## 2020-10-15 NOTE — Progress Notes (Signed)
 Chronic Care Management Pharmacy Note  10/23/2020 Name:  Derrick Lee Longie Jr. MRN:  [id number] DOB:  07/14/1956  Subjective: Derrick Lee Monnin Jr. is an 65 y.o. year old male who is a primary patient of Johnson, Megan P, DO.  The CCM team was consulted for assistance with disease management and care coordination needs.    Engaged with patient by telephone for follow up visit in response to provider referral for pharmacy case management and/or care coordination services.   Consent to Services:  The patient was given information about Chronic Care Management services, agreed to services, and gave verbal consent prior to initiation of services.  Please see initial visit note for detailed documentation.   Patient Care Team: Johnson, Megan P, DO as PCP - General (Family Medicine) Delgaizo, Daniel J, MD as Referring Physician (Orthopedic Surgery) Viprakasit, Davis P, MD as Referring Physician (Urology) ,  S, RPH (Pharmacist)  Recent office visits: 10/07/20-Dr. Johnson-blood work--UTI cipro 7 day  Recent consult visits: 10/03/20-Dr. Brandon, urology--caths 3x per day, upcoming cystoscopy and interstem placement  Hospital visits: None in previous 6 months  Objective:  Lab Results  Component Value Date   CREATININE 1.04 10/07/2020   BUN 15 10/07/2020   GFRNONAA 77 10/07/2020   GFRAA 89 10/07/2020   NA 140 10/07/2020   K 4.4 10/07/2020   CALCIUM 9.5 10/07/2020   CO2 20 10/07/2020    Lab Results  Component Value Date/Time   HGBA1C 5.8 04/21/2018 09:39 AM   HGBA1C 5.3 12/13/2016 09:56 AM   HGBA1C 5.7 01/16/2012 12:39 PM   MICROALBUR 30 (H) 10/07/2020 03:10 PM   MICROALBUR 10 07/23/2019 03:28 PM    Last diabetic Eye exam: No results found for: HMDIABEYEEXA  Last diabetic Foot exam: No results found for: HMDIABFOOTEX   Lab Results  Component Value Date   CHOL 160 10/07/2020   HDL 37 (L) 10/07/2020   LDLCALC 101 (H) 10/07/2020   TRIG 123 10/07/2020    Hepatic  Function Latest Ref Rng & Units 10/07/2020 07/23/2019 10/25/2018  Total Protein 6.0 - 8.5 g/dL 7.0 7.1 7.0  Albumin 3.8 - 4.8 g/dL 4.7 4.4 4.7  AST 0 - 40 IU/L 68(H) 40 49(H)  ALT 0 - 44 IU/L 83(H) 57(H) 64(H)  Alk Phosphatase 44 - 121 IU/L 75 64 61  Total Bilirubin 0.0 - 1.2 mg/dL 0.4 0.3 0.4    Lab Results  Component Value Date/Time   TSH 3.400 10/07/2020 03:15 PM   TSH 2.940 07/23/2019 03:45 PM   FREET4 0.99 08/04/2015 03:53 PM    CBC Latest Ref Rng & Units 10/07/2020 07/23/2019 10/25/2018  WBC 3.4 - 10.8 x10E3/uL 7.6 6.3 5.7  Hemoglobin 13.0 - 17.7 g/dL 16.9 16.1 15.8  Hematocrit 37.5 - 51.0 % 49.0 47.5 47.8  Platelets 150 - 450 x10E3/uL 178 228 196    Lab Results  Component Value Date/Time   VD25OH 30.7 10/07/2020 03:15 PM   VD25OH 27.9 (L) 07/23/2019 03:45 PM    Clinical ASCVD: No  The 10-year ASCVD risk score (Goff DC Jr., et al., 2013) is: 11.8%   Values used to calculate the score:     Age: 65 years     Sex: Male     Is Non-Hispanic African American: No     Diabetic: No     Tobacco smoker: No     Systolic Blood Pressure: 121 mmHg     Is BP treated: Yes     HDL Cholesterol: 37 mg/dL       Chronic Care Management Pharmacy Note  10/23/2020 Name:  Derrick Fick. MRN:  [id number] DOB:  [birth year]  Subjective: Derrick Mcaulay. is an 65 y.o. year old male who is a primary patient of Valerie Roys, DO.  The CCM team was consulted for assistance with disease management and care coordination needs.    Engaged with patient by telephone for follow up visit in response to provider referral for pharmacy case management and/or care coordination services.   Consent to Services:  The patient was given information about Chronic Care Management services, agreed to services, and gave verbal consent prior to initiation of services.  Please see initial visit note for detailed documentation.   Patient Care Team: Valerie Roys, DO as PCP - General (Family Medicine) Delgaizo, Lavon Paganini, MD as Referring Physician (Orthopedic Surgery) Viprakasit, Marlowe Shores, MD as Referring Physician (Urology) Vladimir Faster, [facility] (Pharmacist)  Recent office visits: 10/07/20-Dr. Fredia Beets work--UTI cipro 7 day  Recent consult visits: 10/03/20-Dr. Erlene Quan, urology--caths 3x per day, upcoming cystoscopy and interstem placement  Hospital visits: None in previous 6 months  Objective:  Lab Results  Component Value Date   CREATININE 1.04 10/07/2020   BUN 15 10/07/2020   GFRNONAA 77 10/07/2020   GFRAA 89 10/07/2020   NA 140 10/07/2020   K 4.4 10/07/2020   CALCIUM 9.5 10/07/2020   CO2 20 10/07/2020    Lab Results  Component Value Date/Time   HGBA1C 5.8 04/21/2018 09:39 AM   HGBA1C 5.3 12/13/2016 09:56 AM   HGBA1C 5.7 01/16/2012 12:39 PM   MICROALBUR 30 (H) 10/07/2020 03:10 PM   MICROALBUR 10 07/23/2019 03:28 PM    Last diabetic Eye exam: No results found for: HMDIABEYEEXA  Last diabetic Foot exam: No results found for: HMDIABFOOTEX   Lab Results  Component Value Date   CHOL 160 10/07/2020   HDL 37 (L) 10/07/2020   LDLCALC 101 (H) 10/07/2020   TRIG 123 10/07/2020    Hepatic  Function Latest Ref Rng & Units 10/07/2020 07/23/2019 10/25/2018  Total Protein 6.0 - 8.5 g/dL 7.0 7.1 7.0  Albumin 3.8 - 4.8 g/dL 4.7 4.4 4.7  AST 0 - 40 IU/L 68(H) 40 49(H)  ALT 0 - 44 IU/L 83(H) 57(H) 64(H)  Alk Phosphatase 44 - 121 IU/L 75 64 61  Total Bilirubin 0.0 - 1.2 mg/dL 0.4 0.3 0.4    Lab Results  Component Value Date/Time   TSH 3.400 10/07/2020 03:15 PM   TSH 2.940 07/23/2019 03:45 PM   FREET4 0.99 08/04/2015 03:53 PM    CBC Latest Ref Rng & Units 10/07/2020 07/23/2019 10/25/2018  WBC 3.4 - 10.8 x10E3/uL 7.6 6.3 5.7  Hemoglobin 13.0 - 17.7 g/dL 16.9 16.1 15.8  Hematocrit 37.5 - 51.0 % 49.0 47.5 47.8  Platelets 150 - 450 x10E3/uL 178 228 196    Lab Results  Component Value Date/Time   VD25OH 30.7 10/07/2020 03:15 PM   VD25OH 27.9 (L) 07/23/2019 03:45 PM    Clinical ASCVD: No  The 10-year ASCVD risk score Derrick Bussing DC Jr., et al., 2013) is: 11.8%   Values used to calculate the score:     Age: 43 years     Sex: Male     Is Non-Hispanic African American: No     Diabetic: No     Tobacco smoker: No     Systolic Blood Pressure: 151 mmHg     Is BP treated: Yes     HDL Cholesterol: 37 mg/dL   Chronic Care Management Pharmacy Note  10/23/2020 Name:  Derrick Lee Longie Jr. MRN:  [id number] DOB:  07/14/1956  Subjective: Derrick Lee Monnin Jr. is an 65 y.o. year old male who is a primary patient of Johnson, Megan P, DO.  The CCM team was consulted for assistance with disease management and care coordination needs.    Engaged with patient by telephone for follow up visit in response to provider referral for pharmacy case management and/or care coordination services.   Consent to Services:  The patient was given information about Chronic Care Management services, agreed to services, and gave verbal consent prior to initiation of services.  Please see initial visit note for detailed documentation.   Patient Care Team: Johnson, Megan P, DO as PCP - General (Family Medicine) Delgaizo, Daniel J, MD as Referring Physician (Orthopedic Surgery) Viprakasit, Davis P, MD as Referring Physician (Urology) ,  S, RPH (Pharmacist)  Recent office visits: 10/07/20-Dr. Johnson-blood work--UTI cipro 7 day  Recent consult visits: 10/03/20-Dr. Brandon, urology--caths 3x per day, upcoming cystoscopy and interstem placement  Hospital visits: None in previous 6 months  Objective:  Lab Results  Component Value Date   CREATININE 1.04 10/07/2020   BUN 15 10/07/2020   GFRNONAA 77 10/07/2020   GFRAA 89 10/07/2020   NA 140 10/07/2020   K 4.4 10/07/2020   CALCIUM 9.5 10/07/2020   CO2 20 10/07/2020    Lab Results  Component Value Date/Time   HGBA1C 5.8 04/21/2018 09:39 AM   HGBA1C 5.3 12/13/2016 09:56 AM   HGBA1C 5.7 01/16/2012 12:39 PM   MICROALBUR 30 (H) 10/07/2020 03:10 PM   MICROALBUR 10 07/23/2019 03:28 PM    Last diabetic Eye exam: No results found for: HMDIABEYEEXA  Last diabetic Foot exam: No results found for: HMDIABFOOTEX   Lab Results  Component Value Date   CHOL 160 10/07/2020   HDL 37 (L) 10/07/2020   LDLCALC 101 (H) 10/07/2020   TRIG 123 10/07/2020    Hepatic  Function Latest Ref Rng & Units 10/07/2020 07/23/2019 10/25/2018  Total Protein 6.0 - 8.5 g/dL 7.0 7.1 7.0  Albumin 3.8 - 4.8 g/dL 4.7 4.4 4.7  AST 0 - 40 IU/L 68(H) 40 49(H)  ALT 0 - 44 IU/L 83(H) 57(H) 64(H)  Alk Phosphatase 44 - 121 IU/L 75 64 61  Total Bilirubin 0.0 - 1.2 mg/dL 0.4 0.3 0.4    Lab Results  Component Value Date/Time   TSH 3.400 10/07/2020 03:15 PM   TSH 2.940 07/23/2019 03:45 PM   FREET4 0.99 08/04/2015 03:53 PM    CBC Latest Ref Rng & Units 10/07/2020 07/23/2019 10/25/2018  WBC 3.4 - 10.8 x10E3/uL 7.6 6.3 5.7  Hemoglobin 13.0 - 17.7 g/dL 16.9 16.1 15.8  Hematocrit 37.5 - 51.0 % 49.0 47.5 47.8  Platelets 150 - 450 x10E3/uL 178 228 196    Lab Results  Component Value Date/Time   VD25OH 30.7 10/07/2020 03:15 PM   VD25OH 27.9 (L) 07/23/2019 03:45 PM    Clinical ASCVD: No  The 10-year ASCVD risk score (Goff DC Jr., et al., 2013) is: 11.8%   Values used to calculate the score:     Age: 65 years     Sex: Male     Is Non-Hispanic African American: No     Diabetic: No     Tobacco smoker: No     Systolic Blood Pressure: 121 mmHg     Is BP treated: Yes     HDL Cholesterol: 37 mg/dL 

## 2020-10-23 NOTE — Patient Instructions (Addendum)
Visit Information  It was a pleasure speaking with you today. Thank you for letting me be part of your clinical team. Please call with any questions or concerns.   Goals Addressed            This Visit's Progress   . Track and Manage My Blood Pressure-Hypertension       Timeframe:  Long-Range Goal Priority:  Medium Start Date:                             Expected End Date:                       Follow Up Date 3 month follow up   - check blood pressure 3 times per week    Why is this important?    You won't feel high blood pressure, but it can still hurt your blood vessels.   High blood pressure can cause heart or kidney problems. It can also cause a stroke.   Making lifestyle changes like losing a little weight or eating less salt will help.   Checking your blood pressure at home and at different times of the day can help to control blood pressure.   If the doctor prescribes medicine remember to take it the way the doctor ordered.   Call the office if you cannot afford the medicine or if there are questions about it.     Notes:        The patient verbalized understanding of instructions, educational materials, and care plan provided today and declined offer to receive copy of patient instructions, educational materials, and care plan.   The pharmacy team will reach out to the patient again over the next 30-60 days.   Junita Push. Kenton Kingfisher PharmD, BCPS Clinical Pharmacist [phone]  Hypertension, Adult Hypertension is another name for high blood pressure. High blood pressure forces your heart to work harder to pump blood. This can cause problems over time. There are two numbers in a blood pressure reading. There is a top number (systolic) over a bottom number (diastolic). It is best to have a blood pressure that is below 120/80. Healthy choices can help lower your blood pressure, or you may need medicine to help lower it. What are the causes? The cause of this condition  is not known. Some conditions may be related to high blood pressure. What increases the risk?  Smoking.  Having type 2 diabetes mellitus, high cholesterol, or both.  Not getting enough exercise or physical activity.  Being overweight.  Having too much fat, sugar, calories, or salt (sodium) in your diet.  Drinking too much alcohol.  Having long-term (chronic) kidney disease.  Having a family history of high blood pressure.  Age. Risk increases with age.  Race. You may be at higher risk if you are African American.  Gender. Men are at higher risk than women before age 14. After age 79, women are at higher risk than men.  Having obstructive sleep apnea.  Stress. What are the signs or symptoms?  High blood pressure may not cause symptoms. Very high blood pressure (hypertensive crisis) may cause: ? Headache. ? Feelings of worry or nervousness (anxiety). ? Shortness of breath. ? Nosebleed. ? A feeling of being sick to your stomach (nausea). ? Throwing up (vomiting). ? Changes in how you see. ? Very bad chest pain. ? Seizures. How is this treated?  This condition is treated  by making healthy lifestyle changes, such as: ? Eating healthy foods. ? Exercising more. ? Drinking less alcohol.  Your health care provider may prescribe medicine if lifestyle changes are not enough to get your blood pressure under control, and if: ? Your top number is above 130. ? Your bottom number is above 80.  Your personal target blood pressure may vary. Follow these instructions at home: Eating and drinking  If told, follow the DASH eating plan. To follow this plan: ? Fill one half of your plate at each meal with fruits and vegetables. ? Fill one fourth of your plate at each meal with whole grains. Whole grains include whole-wheat pasta, brown rice, and whole-grain bread. ? Eat or drink low-fat dairy products, such as skim milk or low-fat yogurt. ? Fill one fourth of your plate at each  meal with low-fat (lean) proteins. Low-fat proteins include fish, chicken without skin, eggs, beans, and tofu. ? Avoid fatty meat, cured and processed meat, or chicken with skin. ? Avoid pre-made or processed food.  Eat less than 1,500 mg of salt each day.  Do not drink alcohol if: ? Your doctor tells you not to drink. ? You are pregnant, may be pregnant, or are planning to become pregnant.  If you drink alcohol: ? Limit how much you use to:  0-1 drink a day for women.  0-2 drinks a day for men. ? Be aware of how much alcohol is in your drink. In the [place] one drink equals one 12 oz bottle of beer (355 mL), one 5 oz glass of wine (148 mL), or one 1 oz glass of hard liquor (44 mL).   Lifestyle  Work with your doctor to stay at a healthy weight or to lose weight. Ask your doctor what the best weight is for you.  Get at least 30 minutes of exercise most days of the week. This may include walking, swimming, or biking.  Get at least 30 minutes of exercise that strengthens your muscles (resistance exercise) at least 3 days a week. This may include lifting weights or doing Pilates.  Do not use any products that contain nicotine or tobacco, such as cigarettes, e-cigarettes, and chewing tobacco. If you need help quitting, ask your doctor.  Check your blood pressure at home as told by your doctor.  Keep all follow-up visits as told by your doctor. This is important.   Medicines  Take over-the-counter and prescription medicines only as told by your doctor. Follow directions carefully.  Do not skip doses of blood pressure medicine. The medicine does not work as well if you skip doses. Skipping doses also puts you at risk for problems.  Ask your doctor about side effects or reactions to medicines that you should watch for. Contact a doctor if you:  Think you are having a reaction to the medicine you are taking.  Have headaches that keep coming back (recurring).  Feel dizzy.  Have  swelling in your ankles.  Have trouble with your vision. Get help right away if you:  Get a very bad headache.  Start to feel mixed up (confused).  Feel weak or numb.  Feel faint.  Have very bad pain in your: ? Chest. ? Belly (abdomen).  Throw up more than once.  Have trouble breathing. Summary  Hypertension is another name for high blood pressure.  High blood pressure forces your heart to work harder to pump blood.  For most people, a normal blood pressure is less than 120/80.  Making healthy choices can help lower blood pressure. If your blood pressure does not get lower with healthy choices, you may need to take medicine. This information is not intended to replace advice given to you by your health care provider. Make sure you discuss any questions you have with your health care provider. Document Revised: 04/12/2018 Document Reviewed: 04/12/2018 Elsevier Patient Education  2021 [employer].

## 2020-10-30 DIAGNOSIS — Z79899 Other long term (current) drug therapy: Secondary | ICD-10-CM | POA: Diagnosis not present

## 2020-10-30 DIAGNOSIS — I10 Essential (primary) hypertension: Secondary | ICD-10-CM | POA: Diagnosis not present

## 2020-10-30 DIAGNOSIS — N3000 Acute cystitis without hematuria: Secondary | ICD-10-CM | POA: Diagnosis not present

## 2020-10-30 DIAGNOSIS — Z7982 Long term (current) use of aspirin: Secondary | ICD-10-CM | POA: Diagnosis not present

## 2020-10-30 DIAGNOSIS — R339 Retention of urine, unspecified: Secondary | ICD-10-CM | POA: Diagnosis not present

## 2020-11-03 ENCOUNTER — Telehealth: Payer: Self-pay | Admitting: Family Medicine

## 2020-11-03 ENCOUNTER — Telehealth: Payer: Self-pay | Admitting: Pharmacist

## 2020-11-03 ENCOUNTER — Encounter: Payer: Self-pay | Admitting: Family Medicine

## 2020-11-03 MED ORDER — TADALAFIL 5 MG PO TABS: ORAL_TABLET | ORAL | 3 refills | Status: DC

## 2020-11-03 NOTE — Telephone Encounter (Signed)
  Chronic Care Management   Note  11/03/2020 Name: Derrick Sandoval. MRN: [id number] DOB: March 13, 1956  Spoke with patient regarding patient assistance applications. Per patient he received a letter from a patient assistance foundation requesting additional information several weeks ago. Patient called [employer] and verified they are requesting a new prescription for Cialis faxed [phone].  PCP has been notified. I verified with [employer] [facility] [employer], [place], phone and fax number for prescribing office. Application is complete and will be processed within 24 - 48 hours.    Follow up plan: Patient given my direct contact number and has been instructed to call with further issues. CPA will follow up with patient next month.  Junita Push. Kenton Kingfisher PharmD, [facility] [phone]

## 2020-11-03 NOTE — Telephone Encounter (Signed)
-----   Message from Vladimir Faster, [facility] sent at 11/03/2020 11:24 AM EDT ----- [place] patient assistance is requesting a new RX for Cialis 5 mg. Could you please print? Fax number for [employer] is [phone]. Thank You!

## 2020-11-03 NOTE — Telephone Encounter (Signed)
Derrick Gauss,  Do you have an update on this patient's medications?

## 2020-11-03 NOTE — Telephone Encounter (Signed)
Please fax

## 2020-11-03 NOTE — Telephone Encounter (Signed)
Prescription faxed

## 2020-11-03 NOTE — Telephone Encounter (Signed)
I haven't received any notification from patient assistance or any messages from the patient. I will call him.

## 2020-11-21 ENCOUNTER — Telehealth: Payer: Self-pay

## 2020-11-21 NOTE — Progress Notes (Signed)
Chronic Care Management Pharmacy Assistant   Name: Derrick Sandoval.  MRN: [id number] DOB: 1955-10-22  Reason for Encounter: Disease State- General Adherence Call  Recent office visits:  No visits noted  Recent consult visits:  10/30/20- Sherryll Burger (Urology)-Incomplete bladder emptying   Hospital visits:  None in previous 6 months  Medications: Outpatient Encounter Medications as of 11/21/2020  Medication Sig  . acyclovir (ZOVIRAX) 400 MG tablet Take 1 tablet (400 mg total) by mouth 2 (two) times daily.  Marland Kitchen albuterol (VENTOLIN HFA) 108 (90 Base) MCG/ACT inhaler Inhale 1-2 puffs into the lungs every 6 (six) hours as needed for wheezing or shortness of breath.  . Ascorbic Acid (VITAMIN C) 1000 MG tablet Take 1,000 mg by mouth at bedtime.   Marland Kitchen aspirin EC 81 MG tablet Take 81 mg by mouth daily.  . cholecalciferol (VITAMIN D) 1000 UNITS tablet Take 1,000 Units by mouth daily.   . cyclobenzaprine (FLEXERIL) 10 MG tablet Take 1 tablet (10 mg total) by mouth 3 (three) times daily as needed for muscle spasms. (Patient not taking: Reported on 10/23/2020)  . famotidine (PEPCID) 20 MG tablet Take 1 tablet (20 mg total) by mouth 2 (two) times daily.  . fluticasone (FLONASE) 50 MCG/ACT nasal spray Place 1 spray into both nostrils 2 (two) times daily.  Marland Kitchen guaiFENesin (MUCINEX) 600 MG 12 hr tablet Take 1 tablet (600 mg total) by mouth 2 (two) times daily as needed for cough or to loosen phlegm. (Patient not taking: Reported on 10/23/2020)  . lisinopril (ZESTRIL) 20 MG tablet Take 1 tablet (20 mg total) by mouth daily.  Marland Kitchen loratadine (CLARITIN) 10 MG tablet Take 1 tablet (10 mg total) by mouth daily.  Marland Kitchen lubiprostone (AMITIZA) 8 MCG capsule Take 1 capsule (8 mcg total) by mouth 2 (two) times daily with a meal.  . montelukast (SINGULAIR) 10 MG tablet Take 1 tablet (10 mg total) by mouth at bedtime.  . Multiple Vitamins-Minerals (MULTIVITAMIN WITH MINERALS) tablet Take 1 tablet by mouth daily.  .  naproxen (NAPROSYN) 500 MG tablet Take 1 tablet (500 mg total) by mouth 2 (two) times daily with a meal. (Patient not taking: Reported on 10/23/2020)  . naproxen sodium (ALEVE) 220 MG tablet Take 220 mg by mouth daily as needed.  . nystatin cream (MYCOSTATIN) Apply 1 application topically 2 (two) times daily. (Patient not taking: Reported on 10/23/2020)  . omeprazole (PRILOSEC) 40 MG capsule Take 1 capsule by mouth once daily  . tadalafil (CIALIS) 5 MG tablet TAKE 1 TABLET BY MOUTH ONCE DAILY AS NEEDED FOR BPH  . triamcinolone ointment (KENALOG) 0.1 % Apply 1 application topically 2 (two) times daily. (Patient not taking: Reported on 10/23/2020)   No facility-administered encounter medications on file as of 11/21/2020.   Have you had any problems recently with your health? Patient stated that there are no recent problems with his health   Have you had any problems with your pharmacy? Patient stated that he is seeking alternatives to help him afford his medication with co-pays for his inhalers and allergy medicine     What issues or side effects are you having with your medications? Patient stated there are no current issues or side effects   What would you like me to pass along to [facility] for them to help you with?  Patient stated that he would like some assistance with lowering co-pays   What can we do to take care of you better? Patient stated that he  would only like some financial relief with prescriptions  Star Rating Drugs: Lisinopril 20mg - 90DS last filled 08/04/20 patient stated he has 20 pills remaining  [facility] [employer], [place]

## 2020-12-01 ENCOUNTER — Encounter: Payer: Self-pay | Admitting: Family Medicine

## 2020-12-04 ENCOUNTER — Ambulatory Visit (INDEPENDENT_AMBULATORY_CARE_PROVIDER_SITE_OTHER): Payer: PPO | Admitting: Family Medicine

## 2020-12-04 ENCOUNTER — Encounter: Payer: Self-pay | Admitting: Family Medicine

## 2020-12-04 ENCOUNTER — Other Ambulatory Visit: Payer: Self-pay

## 2020-12-04 VITALS — BP 122/83 | HR 79 | Temp 97.9°F | Wt 277.0 lb

## 2020-12-04 DIAGNOSIS — R369 Urethral discharge, unspecified: Secondary | ICD-10-CM

## 2020-12-04 DIAGNOSIS — N35919 Unspecified urethral stricture, male, unspecified site: Secondary | ICD-10-CM | POA: Diagnosis not present

## 2020-12-04 DIAGNOSIS — J309 Allergic rhinitis, unspecified: Secondary | ICD-10-CM

## 2020-12-04 DIAGNOSIS — R339 Retention of urine, unspecified: Secondary | ICD-10-CM | POA: Diagnosis not present

## 2020-12-04 DIAGNOSIS — N401 Enlarged prostate with lower urinary tract symptoms: Secondary | ICD-10-CM | POA: Diagnosis not present

## 2020-12-04 DIAGNOSIS — N319 Neuromuscular dysfunction of bladder, unspecified: Secondary | ICD-10-CM | POA: Diagnosis not present

## 2020-12-04 DIAGNOSIS — N419 Inflammatory disease of prostate, unspecified: Secondary | ICD-10-CM

## 2020-12-04 LAB — MICROSCOPIC EXAMINATION
Epithelial Cells (non renal): NONE SEEN /hpf (ref 0–10)
RBC, Urine: NONE SEEN /hpf (ref 0–2)

## 2020-12-04 LAB — URINALYSIS, ROUTINE W REFLEX MICROSCOPIC
Bilirubin, UA: NEGATIVE
Glucose, UA: NEGATIVE
Ketones, UA: NEGATIVE
Nitrite, UA: POSITIVE — AB
Protein,UA: NEGATIVE
RBC, UA: NEGATIVE
Specific Gravity, UA: 1.01 (ref 1.005–1.030)
Urobilinogen, Ur: 0.2 mg/dL (ref 0.2–1.0)
pH, UA: 5.5 (ref 5.0–7.5)

## 2020-12-04 MED ORDER — CIPROFLOXACIN HCL 500 MG PO TABS: 500.0000 mg | ORAL_TABLET | Freq: Two times a day (BID) | ORAL | 0 refills | Status: DC

## 2020-12-04 MED ORDER — PREDNISONE 10 MG PO TABS: ORAL_TABLET | ORAL | 0 refills | Status: DC

## 2020-12-04 NOTE — Assessment & Plan Note (Signed)
Will treat with prednisone taper. No sign of bacterial infection today. Continue allergy medicine and rinsing his nose out after mowing. Continue to monitor.

## 2020-12-04 NOTE — Progress Notes (Signed)
BP 122/83   Pulse 79   Temp 97.9 F (36.6 C)   Wt 277 lb (125.6 kg)   SpO2 97%   BMI 43.06 kg/m    Subjective:    Patient ID: Derrick Ball., male    DOB: 12-14-1955, 65 y.o.   MRN: [id number]  HPI: Derrick Lodge. is a 65 y.o. male  Chief Complaint  Patient presents with  . Sinusitis    Patient states he is having sinus drainage, ear pain, green mucus   . Penile Discharge    Patient states for a few days he has drainage. Patient states it burns and there is odor.    URINARY SYMPTOMS Duration: few days Dysuria: burning Urinary frequency: no Urgency: yes Small volume voids: yes Symptom severity: moderate Urinary incontinence: yes Foul odor: yes Hematuria: no Abdominal pain: yes Back pain: yes Suprapubic pain/pressure: yes Flank pain: yes Fever:  no Vomiting: no Relief with cranberry juice: no Relief with pyridium: no Status: worse Previous urinary tract infection: yes Recurrent urinary tract infection: no History of sexually transmitted disease: no Penile discharge: yes Treatments attempted: increasing fluids   UPPER RESPIRATORY TRACT INFECTION Duration: about a week Worst symptom: stuffy, drainage Fever: no Cough: yes Shortness of breath: no Wheezing: no Chest pain: no Chest tightness: yes Chest congestion: no Nasal congestion: yes Runny nose: yes Post nasal drip: yes Sneezing: no Sore throat: yes Swollen glands: no Sinus pressure: yes Headache: no Face pain: no Toothache: no Ear pain: no  Ear pressure: yes bilateral Eyes red/itching:no Eye drainage/crusting: no  Vomiting: no Rash: no Fatigue: yes Sick contacts: no Strep contacts: no  Context: better Recurrent sinusitis: no Relief with OTC cold/cough medications: no  Treatments attempted: mucinex and anti-histamine     Relevant past medical, surgical, family and social history reviewed and updated as indicated. Interim medical history since our last visit  reviewed. Allergies and medications reviewed and updated.  Review of Systems  Constitutional: Positive for chills, diaphoresis and fever. Negative for activity change, appetite change, fatigue and unexpected weight change.  HENT: Positive for congestion, postnasal drip, rhinorrhea and sinus pressure. Negative for dental problem, drooling, ear discharge, ear pain, facial swelling, hearing loss, mouth sores, nosebleeds, sinus pain, sneezing, sore throat, tinnitus, trouble swallowing and voice change.   Respiratory: Negative.   Cardiovascular: Negative.   Gastrointestinal: Positive for abdominal pain. Negative for abdominal distention, anal bleeding, blood in stool, constipation, diarrhea, nausea, rectal pain and vomiting.  Genitourinary: Positive for decreased urine volume, dysuria, frequency, penile discharge and urgency. Negative for difficulty urinating, enuresis, flank pain, genital sores, hematuria, penile pain, penile swelling, scrotal swelling and testicular pain.  Musculoskeletal: Negative.   Skin: Negative.   Psychiatric/Behavioral: Negative.     Per HPI unless specifically indicated above     Objective:    BP 122/83   Pulse 79   Temp 97.9 F (36.6 C)   Wt 277 lb (125.6 kg)   SpO2 97%   BMI 43.06 kg/m   Wt Readings from Last 3 Encounters:  12/04/20 277 lb (125.6 kg)  10/07/20 274 lb (124.3 kg)  10/03/20 278 lb (126.1 kg)    Physical Exam Vitals and nursing note reviewed.  Constitutional:      General: He is not in acute distress.    Appearance: Normal appearance. He is obese. He is not ill-appearing, toxic-appearing or diaphoretic.  HENT:     Head: Normocephalic and atraumatic.     Right Ear: Tympanic membrane, ear  canal and external ear normal.     Left Ear: Tympanic membrane, ear canal and external ear normal.     Nose: Nose normal.     Mouth/Throat:     Mouth: Mucous membranes are moist.     Pharynx: Oropharynx is clear.  Eyes:     General: No scleral icterus.        Right eye: No discharge.        Left eye: No discharge.     Extraocular Movements: Extraocular movements intact.     Conjunctiva/sclera: Conjunctivae normal.     Pupils: Pupils are equal, round, and reactive to light.  Cardiovascular:     Rate and Rhythm: Normal rate and regular rhythm.     Pulses: Normal pulses.     Heart sounds: Normal heart sounds. No murmur heard. No friction rub. No gallop.   Pulmonary:     Effort: Pulmonary effort is normal. No respiratory distress.     Breath sounds: Normal breath sounds. No stridor. No wheezing, rhonchi or rales.  Chest:     Chest wall: No tenderness.  Musculoskeletal:        General: Normal range of motion.     Cervical back: Normal range of motion and neck supple.  Skin:    General: Skin is warm and dry.     Capillary Refill: Capillary refill takes less than 2 seconds.     Coloration: Skin is not jaundiced or pale.     Findings: No bruising, erythema, lesion or rash.  Neurological:     General: No focal deficit present.     Mental Status: He is alert and oriented to person, place, and time. Mental status is at baseline.  Psychiatric:        Mood and Affect: Mood normal.        Behavior: Behavior normal.        Thought Content: Thought content normal.        Judgment: Judgment normal.     Results for orders placed or performed in visit on 10/07/20  Microscopic Examination   Urine  Result Value Ref Range   WBC, UA >30 (A) 0 - 5 /hpf   RBC 3-10 (A) 0 - 2 /hpf   Epithelial Cells (non renal) 0-10 0 - 10 /hpf   Renal Epithel, UA None seen None seen /hpf   Casts None seen None seen /lpf   Cast Type None seen N/A   Crystals None seen N/A   Crystal Type None seen N/A   Mucus, UA None seen Not Estab.   Bacteria, UA Many (A) None seen/Few   Yeast, UA None seen None seen   Trichomonas, UA None seen None seen  Comprehensive metabolic panel  Result Value Ref Range   Glucose 74 65 - 99 mg/dL   BUN 15 8 - 27 mg/dL   Creatinine,  Ser 1.04 0.76 - 1.27 mg/dL   GFR calc non Af Amer 77 >59 mL/min/1.73   GFR calc Af Amer 89 >59 mL/min/1.73   BUN/Creatinine Ratio 14 10 - 24   Sodium 140 134 - 144 mmol/L   Potassium 4.4 3.5 - 5.2 mmol/L   Chloride 102 96 - 106 mmol/L   CO2 20 20 - 29 mmol/L   Calcium 9.5 8.6 - 10.2 mg/dL   Total Protein 7.0 6.0 - 8.5 g/dL   Albumin 4.7 3.8 - 4.8 g/dL   Globulin, Total 2.3 1.5 - 4.5 g/dL   Albumin/Globulin Ratio 2.0 1.2 - 2.2  Bilirubin Total 0.4 0.0 - 1.2 mg/dL   Alkaline Phosphatase 75 44 - 121 IU/L   AST 68 (H) 0 - 40 IU/L   ALT 83 (H) 0 - 44 IU/L  CBC with Differential/Platelet  Result Value Ref Range   WBC 7.6 3.4 - 10.8 x10E3/uL   RBC 5.56 4.14 - 5.80 x10E6/uL   Hemoglobin 16.9 13.0 - 17.7 g/dL   Hematocrit 49.0 37.5 - 51.0 %   MCV 88 79 - 97 fL   MCH 30.4 26.6 - 33.0 pg   MCHC 34.5 31.5 - 35.7 g/dL   RDW 13.3 11.6 - 15.4 %   Platelets 178 150 - 450 x10E3/uL   Neutrophils 47 Not Estab. %   Lymphs 38 Not Estab. %   Monocytes 11 Not Estab. %   Eos 3 Not Estab. %   Basos 1 Not Estab. %   Neutrophils Absolute 3.5 1.4 - 7.0 x10E3/uL   Lymphocytes Absolute 2.9 0.7 - 3.1 x10E3/uL   Monocytes Absolute 0.8 0.1 - 0.9 x10E3/uL   EOS (ABSOLUTE) 0.2 0.0 - 0.4 x10E3/uL   Basophils Absolute 0.1 0.0 - 0.2 x10E3/uL   Immature Granulocytes 0 Not Estab. %   Immature Grans (Abs) 0.0 0.0 - 0.1 x10E3/uL  Lipid Panel w/o Chol/HDL Ratio  Result Value Ref Range   Cholesterol, Total 160 100 - 199 mg/dL   Triglycerides 123 0 - 149 mg/dL   HDL 37 (L) >39 mg/dL   VLDL Cholesterol Cal 22 5 - 40 mg/dL   LDL Chol Calc (NIH) 101 (H) 0 - 99 mg/dL  PSA  Result Value Ref Range   Prostate Specific Ag, Serum 0.2 0.0 - 4.0 ng/mL  TSH  Result Value Ref Range   TSH 3.400 0.450 - 4.500 uIU/mL  Urinalysis, Routine w reflex microscopic  Result Value Ref Range   Specific Gravity, UA 1.010 1.005 - 1.030   pH, UA 5.5 5.0 - 7.5   Color, UA Yellow Yellow   Appearance Ur Cloudy (A) Clear    Leukocytes,UA 3+ (A) Negative   Protein,UA Negative Negative/Trace   Glucose, UA Negative Negative   Ketones, UA Negative Negative   RBC, UA 1+ (A) Negative   Bilirubin, UA Negative Negative   Urobilinogen, Ur 0.2 0.2 - 1.0 mg/dL   Nitrite, UA Positive (A) Negative   Microscopic Examination See below:   VITAMIN D 25 Hydroxy (Vit-D Deficiency, Fractures)  Result Value Ref Range   Vit D, 25-Hydroxy 30.7 30.0 - 100.0 ng/mL  Microalbumin, Urine Waived  Result Value Ref Range   Microalb, Ur Waived 30 (H) 0 - 19 mg/L   Creatinine, Urine Waived 100 10 - 300 mg/dL   Microalb/Creat Ratio 30-300 (H) <30 mg/g      Assessment & Plan:   Problem List Items Addressed This Visit      Respiratory   Rhinitis, allergic    Will treat with prednisone taper. No sign of bacterial infection today. Continue allergy medicine and rinsing his nose out after mowing. Continue to monitor.        Other Visit Diagnoses    Penile discharge    -  Primary   Checking labs today. Await results. 2+ Leuks and +nitrates.    Relevant Orders   HIV Antibody (routine testing w rflx)   GC/Chlamydia Probe Amp   Hepatitis panel, acute   RPR   HSV(herpes simplex vrs) 1+2 ab-IgG   Urinalysis, Routine w reflex microscopic   Prostatitis, unspecified prostatitis type  Will treat with cipro. Call with any concerns or not getting better. Continue to monitor.        Follow up plan: Return if symptoms worsen or fail to improve.

## 2020-12-05 LAB — HSV(HERPES SIMPLEX VRS) I + II AB-IGG
HSV 1 Glycoprotein G Ab, IgG: <0.91 index (ref 0.00–0.90)
HSV 2 IgG, Type Spec: 7.44 index — ABNORMAL HIGH (ref 0.00–0.90)

## 2020-12-05 LAB — HIV ANTIBODY (ROUTINE TESTING W REFLEX): HIV Screen 4th Generation wRfx: NONREACTIVE

## 2020-12-05 LAB — GC/CHLAMYDIA PROBE AMP
Chlamydia trachomatis, NAA: NEGATIVE
Neisseria Gonorrhoeae by PCR: NEGATIVE

## 2020-12-05 LAB — RPR: RPR Ser Ql: NONREACTIVE

## 2020-12-05 LAB — HEPATITIS PANEL, ACUTE
Hep A IgM: NEGATIVE
Hep B C IgM: NEGATIVE
Hep C Virus Ab: 0.2 s/co ratio (ref 0.0–0.9)
Hepatitis B Surface Ag: NEGATIVE

## 2020-12-09 ENCOUNTER — Encounter: Payer: Self-pay | Admitting: Urology

## 2020-12-11 ENCOUNTER — Telehealth: Payer: Self-pay

## 2020-12-11 NOTE — Chronic Care Management (AMB) (Signed)
    Chronic Care Management Pharmacy Assistant   Name: Derrick Sandoval.  MRN: [id number] DOB: 1955-09-29   Reason for Encounter: Patient Assistance Program     Medications: Outpatient Encounter Medications as of 12/11/2020  Medication Sig  . acyclovir (ZOVIRAX) 400 MG tablet Take 1 tablet (400 mg total) by mouth 2 (two) times daily.  Marland Kitchen albuterol (VENTOLIN HFA) 108 (90 Base) MCG/ACT inhaler Inhale 1-2 puffs into the lungs every 6 (six) hours as needed for wheezing or shortness of breath.  . Ascorbic Acid (VITAMIN C) 1000 MG tablet Take 1,000 mg by mouth at bedtime.   Marland Kitchen aspirin EC 81 MG tablet Take 81 mg by mouth daily.  . cholecalciferol (VITAMIN D) 1000 UNITS tablet Take 1,000 Units by mouth daily.   . ciprofloxacin (CIPRO) 500 MG tablet Take 1 tablet (500 mg total) by mouth 2 (two) times daily.  . cyclobenzaprine (FLEXERIL) 10 MG tablet Take 1 tablet (10 mg total) by mouth 3 (three) times daily as needed for muscle spasms. (Patient not taking: No sig reported)  . famotidine (PEPCID) 20 MG tablet Take 1 tablet (20 mg total) by mouth 2 (two) times daily.  . fluticasone (FLONASE) 50 MCG/ACT nasal spray Place 1 spray into both nostrils 2 (two) times daily.  Marland Kitchen guaiFENesin (MUCINEX) 600 MG 12 hr tablet Take 1 tablet (600 mg total) by mouth 2 (two) times daily as needed for cough or to loosen phlegm.  Marland Kitchen lisinopril (ZESTRIL) 20 MG tablet Take 1 tablet (20 mg total) by mouth daily.  Marland Kitchen loratadine (CLARITIN) 10 MG tablet Take 1 tablet (10 mg total) by mouth daily.  Marland Kitchen lubiprostone (AMITIZA) 8 MCG capsule Take 1 capsule (8 mcg total) by mouth 2 (two) times daily with a meal.  . montelukast (SINGULAIR) 10 MG tablet Take 1 tablet (10 mg total) by mouth at bedtime.  . Multiple Vitamins-Minerals (MULTIVITAMIN WITH MINERALS) tablet Take 1 tablet by mouth daily.  . naproxen (NAPROSYN) 500 MG tablet Take 1 tablet (500 mg total) by mouth 2 (two) times daily with a meal.  . naproxen sodium (ALEVE) 220 MG  tablet Take 220 mg by mouth daily as needed.  . nystatin cream (MYCOSTATIN) Apply 1 application topically 2 (two) times daily.  Marland Kitchen omeprazole (PRILOSEC) 40 MG capsule Take 1 capsule by mouth once daily  . predniSONE (DELTASONE) 10 MG tablet 6 tabs today and tomorrow, 5 tabs the next 2 days, decrease by 1 every other day until gone.  . tadalafil (CIALIS) 5 MG tablet TAKE 1 TABLET BY MOUTH ONCE DAILY AS NEEDED FOR BPH  . triamcinolone ointment (KENALOG) 0.1 % Apply 1 application topically 2 (two) times daily.   No facility-administered encounter medications on file as of 12/11/2020.    [place] regarding patients Cialis assistance application. Patient is approved from 11/04/20-08/15/21.  Lizbeth Bark Clinical Pharmacist Assistant [phone]

## 2020-12-12 ENCOUNTER — Encounter: Payer: Self-pay | Admitting: Family Medicine

## 2020-12-15 ENCOUNTER — Encounter: Payer: Self-pay | Admitting: Family Medicine

## 2020-12-16 ENCOUNTER — Ambulatory Visit: Payer: Self-pay | Admitting: *Deleted

## 2020-12-16 ENCOUNTER — Encounter: Payer: Self-pay | Admitting: Family Medicine

## 2020-12-16 ENCOUNTER — Other Ambulatory Visit: Payer: Self-pay

## 2020-12-16 ENCOUNTER — Ambulatory Visit (INDEPENDENT_AMBULATORY_CARE_PROVIDER_SITE_OTHER): Payer: PPO | Admitting: Family Medicine

## 2020-12-16 ENCOUNTER — Ambulatory Visit
Admission: RE | Admit: 2020-12-16 | Discharge: 2020-12-16 | Disposition: A | Discharge status: Home / Self Care | Payer: PPO | Source: Ambulatory Visit | Attending: Family Medicine | Admitting: Family Medicine

## 2020-12-16 VITALS — BP 100/65 | HR 87 | Temp 98.6°F | Wt 277.0 lb

## 2020-12-16 DIAGNOSIS — R600 Localized edema: Secondary | ICD-10-CM | POA: Diagnosis not present

## 2020-12-16 DIAGNOSIS — I861 Scrotal varices: Secondary | ICD-10-CM | POA: Diagnosis not present

## 2020-12-16 DIAGNOSIS — N50812 Left testicular pain: Secondary | ICD-10-CM

## 2020-12-16 DIAGNOSIS — N433 Hydrocele, unspecified: Secondary | ICD-10-CM | POA: Diagnosis not present

## 2020-12-16 DIAGNOSIS — R8281 Pyuria: Secondary | ICD-10-CM

## 2020-12-16 LAB — MICROSCOPIC EXAMINATION: Epithelial Cells (non renal): NONE SEEN /hpf (ref 0–10)

## 2020-12-16 LAB — URINALYSIS, ROUTINE W REFLEX MICROSCOPIC
Bilirubin, UA: NEGATIVE
Ketones, UA: NEGATIVE
Nitrite, UA: NEGATIVE
Protein,UA: NEGATIVE
Specific Gravity, UA: 1.015 (ref 1.005–1.030)
Urobilinogen, Ur: 0.2 mg/dL (ref 0.2–1.0)
pH, UA: 5.5 (ref 5.0–7.5)

## 2020-12-16 NOTE — Telephone Encounter (Signed)
Patient calling to report pain and swelling in scrotum- left since yesterday. Advised ED per protocol- patient declined and hung up. Call sent for review. Reason for Disposition . Scrotum is painful or tender to touch  Answer Assessment - Initial Assessment Questions 1. SCROTAL SWELLING: "What does the scrotum look like?" "How swollen is it?" (mild, moderate severe; compare to other side)     Swelling in left testicle- golf ball size 2. LOCATION: "Where is the swelling located?"     L testicle 3. ONSET: "When did the swelling start?"    Yesterday afternoon 4. PATTERN: "Does it come and go, or has it been constant since it started?"      constant 5. SCROTAL PAIN: "Is there any pain?" If Yes, ask: "How bad is it?"  (Scale 1-10; or mild, moderate, severe)    Yes-  Walks and urination causes pain 6. HERNIA: "Has a doctor ever told you that you have a hernia?"     no 7. OTHER SYMPTOMS: "Do you have any other symptoms?" (e.g., fever, abdominal pain, vomiting, difficulty passing urine)     Difficulty urinating- pain with urination  Protocols used: SCROTUM [facility]

## 2020-12-16 NOTE — Progress Notes (Signed)
BP 100/65   Pulse 87   Temp 98.6 F (37 C)   Wt 277 lb (125.6 kg)   SpO2 97%   BMI 43.06 kg/m    Subjective:    Patient ID: Derrick Sandoval., male    DOB: Jul 13, 1956, 65 y.o.   MRN: [id number]  HPI: Derrick Sandoval. is a 65 y.o. male  Chief Complaint  Patient presents with  . Testicle Pain    Patient states yesterday he was having left testicle pain, having some swelling today.    Derrick Sandoval presents today not feeling well. He is currently on cipro for prostatitis and is concerned that it is not working. He notes that he has been irritable and not feeling well.  He started with L testicle pain and swelling starting yesterday. He woke up yesterday AM with pain suprapubically, then pain seemed to radiate into his L testicle. He was feeling very tired yesterday and was sleeping most of the day yesterday because he wasn't feeling well. He has not seen any blood in his urine. No burning when he pees. He is peeing a normal amount.  Dysuria: no Urinary frequency: no Urgency: yes Small volume voids: yes Symptom severity: moderate Urinary incontinence: no Foul odor: no Hematuria: no Abdominal pain: yes Back pain: no Suprapubic pain/pressure: yes Flank pain: no Fever:  no Vomiting: no Status: worse Penile discharge: no Treatments attempted: antibiotics and increasing fluids    Relevant past medical, surgical, family and social history reviewed and updated as indicated. Interim medical history since our last visit reviewed. Allergies and medications reviewed and updated.  Review of Systems  Constitutional: Positive for activity change and fatigue. Negative for appetite change, chills, diaphoresis, fever and unexpected weight change.  Respiratory: Negative.   Cardiovascular: Negative.   Gastrointestinal: Positive for abdominal pain. Negative for abdominal distention, anal bleeding, blood in stool, constipation, diarrhea, nausea, rectal pain and vomiting.  Genitourinary:  Positive for scrotal swelling and testicular pain. Negative for decreased urine volume, difficulty urinating, dysuria, enuresis, flank pain, frequency, genital sores, hematuria, penile discharge, penile pain, penile swelling and urgency.  Musculoskeletal: Negative.   Skin: Negative.   Psychiatric/Behavioral: Negative.     Per HPI unless specifically indicated above     Objective:    BP 100/65   Pulse 87   Temp 98.6 F (37 C)   Wt 277 lb (125.6 kg)   SpO2 97%   BMI 43.06 kg/m   Wt Readings from Last 3 Encounters:  12/16/20 277 lb (125.6 kg)  12/04/20 277 lb (125.6 kg)  10/07/20 274 lb (124.3 kg)    Physical Exam Vitals and nursing note reviewed.  Constitutional:      General: He is not in acute distress.    Appearance: Normal appearance. He is not ill-appearing, toxic-appearing or diaphoretic.  HENT:     Head: Normocephalic and atraumatic.     Right Ear: External ear normal.     Left Ear: External ear normal.     Nose: Nose normal.     Mouth/Throat:     Mouth: Mucous membranes are moist.     Pharynx: Oropharynx is clear.  Eyes:     General: No scleral icterus.       Right eye: No discharge.        Left eye: No discharge.     Extraocular Movements: Extraocular movements intact.     Conjunctiva/sclera: Conjunctivae normal.     Pupils: Pupils are equal, round, and reactive to light.  Cardiovascular:     Rate and Rhythm: Normal rate and regular rhythm.     Pulses: Normal pulses.     Heart sounds: Normal heart sounds. No murmur heard. No friction rub. No gallop.   Pulmonary:     Effort: Pulmonary effort is normal. No respiratory distress.     Breath sounds: Normal breath sounds. No stridor. No wheezing, rhonchi or rales.  Chest:     Chest wall: No tenderness.  Genitourinary:    Penis: Normal.      Testes:        Right: Mass or tenderness not present.        Left: Tenderness, swelling and varicocele (posteriorly) present. Mass not present.  Musculoskeletal:         General: Normal range of motion.     Cervical back: Normal range of motion and neck supple.  Skin:    General: Skin is warm and dry.     Capillary Refill: Capillary refill takes less than 2 seconds.     Coloration: Skin is not jaundiced or pale.     Findings: No bruising, erythema, lesion or rash.  Neurological:     General: No focal deficit present.     Mental Status: He is alert and oriented to person, place, and time. Mental status is at baseline.  Psychiatric:        Mood and Affect: Mood normal.        Behavior: Behavior normal.        Thought Content: Thought content normal.        Judgment: Judgment normal.     Results for orders placed or performed in visit on 12/04/20  GC/Chlamydia Probe Amp   Specimen: Urine   UR  Result Value Ref Range   Chlamydia trachomatis, NAA Negative Negative   Neisseria Gonorrhoeae by PCR Negative Negative  Microscopic Examination   Urine  Result Value Ref Range   WBC, UA 11-30 (A) 0 - 5 /hpf   RBC None seen 0 - 2 /hpf   Epithelial Cells (non renal) None seen 0 - 10 /hpf   Bacteria, UA Many (A) None seen/Few  HIV Antibody (routine testing w rflx)  Result Value Ref Range   HIV Screen 4th Generation wRfx Non Reactive Non Reactive  Hepatitis panel, acute  Result Value Ref Range   Hep A IgM Negative Negative   Hepatitis B Surface Ag Negative Negative   Hep B C IgM Negative Negative   Hep C Virus Ab 0.2 0.0 - 0.9 s/co ratio  RPR  Result Value Ref Range   RPR Ser Ql Non Reactive Non Reactive  HSV(herpes simplex vrs) 1+2 ab-IgG  Result Value Ref Range   HSV 1 Glycoprotein G Ab, IgG <0.91 0.00 - 0.90 index   HSV 2 IgG, Type Spec 7.44 (H) 0.00 - 0.90 index  Urinalysis, Routine w reflex microscopic  Result Value Ref Range   Specific Gravity, UA 1.010 1.005 - 1.030   pH, UA 5.5 5.0 - 7.5   Color, UA Yellow Yellow   Appearance Ur Cloudy (A) Clear   Leukocytes,UA 2+ (A) Negative   Protein,UA Negative Negative/Trace   Glucose, UA Negative  Negative   Ketones, UA Negative Negative   RBC, UA Negative Negative   Bilirubin, UA Negative Negative   Urobilinogen, Ur 0.2 0.2 - 1.0 mg/dL   Nitrite, UA Positive (A) Negative   Microscopic Examination See below:       Assessment & Plan:   Problem List  Items Addressed This Visit   None   Visit Diagnoses    Left testicular pain    -  Primary   To see urology tomorrow morning. Testicle feels normal, ?varicocele posteriorly. Will arrange [place] prior to his appointment with urology tomorrow. Checking UA.   Relevant Orders   Urinalysis, Routine w reflex microscopic   US SCROTUM DOPPLER       Follow up plan: Return if symptoms worsen or fail to improve.

## 2020-12-16 NOTE — Addendum Note (Signed)
Addended by: Valerie Roys on: 12/16/2020 03:29 PM   Modules accepted: Orders

## 2020-12-16 NOTE — Progress Notes (Signed)
12/17/2020  10:33 AM   Derrick Sandoval. 16-Jan-1956 [id number]  Referring provider: Valerie Roys, DO Derrick Sandoval,  [place] [id number] Chief Complaint  Patient presents with  . Groin Swelling    HPI: Derrick Sandoval. is a 65 y.o. male with a personal history of incomplete bladder emptying, and combined arterial insufficiency and corporo-venous occlusive erectile dysfunction, who presents today for evaluation and management of left testicular swelling.  Please see previous notes for details.  He has a personal history of a TURP and urethral stricture.  He is now being followed by [facility] where he is undergone urodynamics. He is scheduled to have an InterStim device placed by Derrick Sandoval on 12/23/2020 and 01/06/2021.  He caths 3 times a day.  We recently ordered his catheters which are 30 [place] which he uses 3 times a day (500 ml in the morning and does not void at night).  He also voids spontaneously in between.  He reports that he developed urinary issues including penile discharge in late April.  He was seen by his PCP, Dr. Park Sandoval on  4/21 2022 which time his urine was frankly positive and he was started on Cipro for presumed prostatitis.  This was a prolonged course which he continues.  No urine culture was sent.  His scrotal swelling started Monday (12/15/2020) at about 4am. He said that it was painful.  This has been worsening.  He was seen and evaluated yesterday again by his PCP in which time a repeat UA and now urine culture is pending.  He had a scrotal ultrasound consistent with left epididymal orchitis.  He returns today to discuss this.  He still taking Cipro.  Previous urine cultures resistant to fluoroquinolones and Bactrim.   PMH: Past Medical History:  Diagnosis Date  . Allergy   . Arthritis   . Cough    nonproductive no fever saw at dr 05-26-18  . GERD (gastroesophageal reflux disease)   . Hyperlipidemia   . Hypertension   . 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 COLONOSCOPY WITH PROPOFOL N/A 05/12/2018   Procedure: COLONOSCOPY WITH PROPOFOL;  Surgeon: Derrick Bellows, MD;  Location: [facility] ENDOSCOPY;  Service: Gastroenterology;  Laterality: N/A;  . ESOPHAGOGASTRODUODENOSCOPY (EGD) WITH PROPOFOL N/A 05/12/2018   Procedure: ESOPHAGOGASTRODUODENOSCOPY (EGD) WITH PROPOFOL;  Surgeon: Derrick Bellows, MD;  Location: [facility] ENDOSCOPY;  Service: Gastroenterology;  Laterality: N/A;  . ESOPHAGOGASTRODUODENOSCOPY (EGD) WITH PROPOFOL N/A 06/01/2018   Procedure: ESOPHAGOGASTRODUODENOSCOPY (EGD) WITH PROPOFOL;  Surgeon: Derrick Banister, MD;  Location: [facility] ENDOSCOPY;  Service: Endoscopy;  Laterality: N/A;  . EUS N/A 06/01/2018   Procedure: UPPER ENDOSCOPIC ULTRASOUND (EUS) RADIAL;  Surgeon: Derrick Banister, MD;  Location: [facility] ENDOSCOPY;  Service: Endoscopy;  Laterality: N/A;  . JOINT REPLACEMENT Bilateral   . NASAL SINUS SURGERY    . TOTAL KNEE ARTHROPLASTY      Home Medications:  Allergies as of 12/17/2020   No Known Allergies     Medication List       Accurate as of Dec 17, 2020 10:33 AM. If you have any questions, ask your nurse or doctor.        STOP taking these medications   ciprofloxacin 500 MG tablet Commonly known as: Cipro Stopped by: Derrick Espy, MD     TAKE these medications   acyclovir 400 MG tablet Commonly known as: ZOVIRAX Take 1 tablet (400  mg total) by mouth 2 (two) times daily.   albuterol 108 (90 Base) MCG/ACT inhaler Commonly known as: VENTOLIN HFA Inhale 1-2 puffs into the lungs every 6 (six) hours as needed for wheezing or shortness of breath.   amoxicillin-clavulanate 875-125 MG tablet Commonly known as: AUGMENTIN Take 1 tablet by mouth every 12 (twelve) hours. Started by: Derrick Espy, MD   aspirin EC 81 MG tablet Take 81 mg by mouth daily.   cholecalciferol 1000 units tablet Commonly known as: VITAMIN D Take 1,000 Units by mouth  daily.   cyclobenzaprine 10 MG tablet Commonly known as: FLEXERIL Take 1 tablet (10 mg total) by mouth 3 (three) times daily as needed for muscle spasms.   famotidine 20 MG tablet Commonly known as: PEPCID Take 1 tablet (20 mg total) by mouth 2 (two) times daily.   fluticasone 50 MCG/ACT nasal spray Commonly known as: FLONASE Place 1 spray into both nostrils 2 (two) times daily.   guaiFENesin 600 MG 12 hr tablet Commonly known as: Mucinex Take 1 tablet (600 mg total) by mouth 2 (two) times daily as needed for cough or to loosen phlegm.   lisinopril 20 MG tablet Commonly known as: ZESTRIL Take 1 tablet (20 mg total) by mouth daily.   loratadine 10 MG tablet Commonly known as: CLARITIN Take 1 tablet (10 mg total) by mouth daily.   lubiprostone 8 MCG capsule Commonly known as: Amitiza Take 1 capsule (8 mcg total) by mouth 2 (two) times daily with a meal.   montelukast 10 MG tablet Commonly known as: SINGULAIR Take 1 tablet (10 mg total) by mouth at bedtime.   multivitamin with minerals tablet Take 1 tablet by mouth daily.   naproxen 500 MG tablet Commonly known as: Naprosyn Take 1 tablet (500 mg total) by mouth 2 (two) times daily with a meal.   naproxen sodium 220 MG tablet Commonly known as: ALEVE Take 220 mg by mouth daily as needed.   nystatin cream Commonly known as: MYCOSTATIN Apply 1 application topically 2 (two) times daily.   omeprazole 40 MG capsule Commonly known as: PRILOSEC Take 1 capsule by mouth once daily   tadalafil 5 MG tablet Commonly known as: CIALIS TAKE 1 TABLET BY MOUTH ONCE DAILY AS NEEDED FOR BPH   triamcinolone ointment 0.1 % Commonly known as: KENALOG Apply 1 application topically 2 (two) times daily.   vitamin C 1000 MG tablet Take 1,000 mg by mouth at bedtime.       Allergies: No Known Allergies  Family History: Family History  Problem Relation Age of Onset  . Breast cancer Mother   . Cancer Father        unsure,  passed away before it was confirmed  . Pancreatic cancer Daughter   . Diabetes Neg Hx   . Heart disease Neg Hx   . Hypertension Neg Hx   . Stroke Neg Hx   . COPD Neg Hx   . Colon cancer Neg Hx     Social History:   reports that he has never smoked. He has never used smokeless tobacco. He reports previous alcohol use. He reports that he does not use drugs.  ROS: Pertinent ROS in HPI.  Physical Exam: BP 131/89   Pulse 74   Constitutional:  Alert and oriented, No acute distress. HEENT: [place] AT, moist mucus membranes.  Trachea midline, no masses. Cardiovascular: No clubbing, cyanosis, or edema. Respiratory: Normal respiratory effort, no increased work of breathing. GU: Phallus circumcised/uncircumcised without lesions, testes  descended bilaterally without masses or tenderness, spermatic cord/epididymis palpably normal bilaterally.  Vasa palpable bilaterally. Left testicle was enlarged, tender and consistend with epididymitis/ orchitis. No concern for abscess. Skin: No rashes, bruises or suspicious lesions. Neurologic: Grossly intact, no focal deficits, moving all 4 extremities. Psychiatric: Normal mood and affect.  Urinalysis  Frankly positive, urine culture pending  I have reviewed the labs.  Pertinent Imaging: Narrative & Impression  CLINICAL DATA:  Scrotal pain on the left  EXAM: SCROTAL ULTRASOUND  DOPPLER ULTRASOUND OF THE TESTICLES  TECHNIQUE: Complete ultrasound examination of the testicles, epididymis, and other scrotal structures was performed. Color and spectral Doppler ultrasound were also utilized to evaluate blood flow to the testicles.  COMPARISON:  None.  FINDINGS: Right testicle  Measurements: 4.5 x 2.0 x 2.9 cm. No mass or microlithiasis visualized.  Left testicle  Measurements: 4.7 x 3.3 x 4.4 cm. No mass or microlithiasis visualized. There is generalized hypervascularity in the left testis.  Right epididymis: Normal in size and  appearance. No edema or hyperemia.  Left epididymis: Left epididymis is edematous and hyperemic. No focal left epididymal mass.  Hydrocele: There is a moderate hydrocele on the left containing mild debris. Minimal hydrocele on the right.  Varicocele:  None visualized.  Pulsed Doppler interrogation of both testes demonstrates normal low resistance arterial and venous waveforms bilaterally.  No appreciable scrotal wall thickening.  IMPRESSION: 1. Findings indicative of orchitis and epididymitis on the left. No left testicular or left epididymal mass evident.  2. Moderate hydrocele on the left containing debris, likely of infectious etiology.  3.  Normal appearing right testis and epididymis.  4.  Minimal right-sided hydrocele.   Electronically Signed   By: Lowella Grip III M.D.   On: 12/16/2020 17:18    Scrotal ultrasound images were personally reviewed.  Agree with radiologic interpretation.  Assessment & Plan:   1. Left epididymo-orchitis  Will change to Augmentin based on his previous culture in 09/2019 which was resistant to bactrim and Cipro, and Levo.  Stop taking Cipro.  Augmentin 2x a day for 10 days.  Supportive care, return early next week if fails to improve or worsens  F/u urine culture and adjust medications as needed  2. Incomplete bladder emptying Continue CIC  Schedule for InterStim early next week at [facility], advised to call this provider and that her know that he has a active infection to assess whether or not this should be delayed or rescheduled   F/u as previously scheduled  I, Ardyth Gal, am acting as a scribe for Dr. Hollice Sandoval.   I have reviewed the above documentation for accuracy and completeness, and I agree with the above.   Derrick Espy, MD     [facility] [address] [place], [place] [address] [phone]

## 2020-12-16 NOTE — Telephone Encounter (Signed)
I have an appointment with him this afternoon, but I would advise him to call his urologist- it would probably be more helpful

## 2020-12-16 NOTE — Telephone Encounter (Signed)
Called and ledt a message for patient with Dr.Johnson's response.

## 2020-12-17 ENCOUNTER — Ambulatory Visit (INDEPENDENT_AMBULATORY_CARE_PROVIDER_SITE_OTHER): Payer: PPO | Admitting: Urology

## 2020-12-17 ENCOUNTER — Encounter: Payer: Self-pay | Admitting: Urology

## 2020-12-17 ENCOUNTER — Telehealth: Payer: Self-pay | Admitting: *Deleted

## 2020-12-17 VITALS — BP 131/89 | HR 74

## 2020-12-17 DIAGNOSIS — R339 Retention of urine, unspecified: Secondary | ICD-10-CM

## 2020-12-17 MED ORDER — AMOXICILLIN-POT CLAVULANATE 875-125 MG PO TABS: 1.0000 | ORAL_TABLET | Freq: Two times a day (BID) | ORAL | 0 refills | Status: DC

## 2020-12-17 NOTE — Patient Instructions (Signed)
Epididymitis  Epididymitis is swelling (inflammation) or infection of the epididymis. The epididymis is a cord-like structure that is located along the top and back part of the testicle. It collects and stores sperm from the testicle. This condition can also cause pain and swelling of the testicle and scrotum. Symptoms usually start suddenly (acute epididymitis). Sometimes epididymitis starts gradually and lasts for a while (chronic epididymitis). This type may be harder to treat. What are the causes? In men ages 20-40, this condition is usually caused by a bacterial infection or a sexually transmitted disease (STD), such as:  Gonorrhea.  Chlamydia. In men 40 and older who do not have anal sex, this condition is usually caused by bacteria from a blockage or from abnormalities in the urinary system. These can result from:  Having a tube placed into the bladder (urinary catheter).  Having an enlarged or inflamed prostate gland.  Having recently had urinary tract surgery.  Having a problem with a backward flow of urine (retrograde). In men who have a condition that weakens the body's defense system (immune system), such as HIV, this condition can be caused by:  Other bacteria, including tuberculosis and syphilis.  Viruses.  Fungi. Sometimes this condition occurs without infection. This may happen because of trauma or repetitive activities such as sports. What increases the risk? You are more likely to develop this condition if you have:  Unprotected sex with more than one partner.  Anal sex.  Recently had surgery.  A urinary catheter.  Urinary problems.  A suppressed immune system. What are the signs or symptoms? This condition usually begins suddenly with chills, fever, and pain behind the scrotum and in the testicle. Other symptoms include:  Swelling of the scrotum, testicle, or both.  Pain when ejaculating or urinating.  Pain in the back or  abdomen.  Nausea.  Itching and discharge from the penis.  A frequent need to pass urine.  Redness, increased warmth, and tenderness of the scrotum. How is this diagnosed? Your health care provider can diagnose this condition based on your symptoms and medical history. Your health care provider will also do a physical exam to ask about your symptoms and check your scrotum and testicle for swelling, pain, and redness. You may also have other tests, including:  Examination of discharge from the penis.  Urine tests for infections, such as STDs.  Ultrasound test for blood flow and inflammation. Your health care provider may test you for other STDs, including HIV. How is this treated? Treatment for this condition depends on the cause. If your condition is caused by a bacterial infection, oral antibiotic medicine may be prescribed. If the bacterial infection has spread to your blood, you may need to receive IV antibiotics. For both bacterial and nonbacterial epididymitis, you may be treated with:  Rest.  Elevation of the scrotum.  Pain medicines.  Anti-inflammatory medicines. Surgery may be needed to treat:  Bacterial epididymitis that causes pus to build up in the scrotum (abscess).  Chronic epididymitis that has not responded to other treatments. Follow these instructions at home: Medicines  Take over-the-counter and prescription medicines only as told by your health care provider.  If you were prescribed an antibiotic medicine, take it as told by your health care provider. Do not stop taking the antibiotic even if your condition improves. Sexual activity  If your epididymitis was caused by an STD, avoid sexual activity until your treatment is complete.  Inform your sexual partner or partners if you test positive for   an STD. They may need to be treated. Do not engage in sexual activity with your partner or partners until their treatment is completed. Managing pain and  swelling  If directed, elevate your scrotum and apply ice. ? Put ice in a plastic bag. ? Place a small towel or pillow between your legs. ? Rest your scrotum on the pillow or towel. ? Place another towel between your skin and the plastic bag. ? Leave the ice on for 20 minutes, 2-3 times a day.  Try taking a sitz bath to help with discomfort. This is a warm water bath that is taken while you are sitting down. The water should only come up to your hips and should cover your buttocks. Do this 3-4 times per day or as told by your health care provider.  Keep your scrotum elevated and supported while resting. Ask your health care provider if you should wear a scrotal support, such as a jockstrap. Wear it as told by your health care provider.   General instructions  Return to your normal activities as told by your health care provider. Ask your health care provider what activities are safe for you.  Drink enough fluid to keep your urine pale yellow.  Keep all follow-up visits as told by your health care provider. This is important. Contact a health care provider if:  You have a fever.  Your pain medicine is not helping.  Your pain is getting worse.  Your symptoms do not improve within 3 days. Summary  Epididymitis is swelling (inflammation) or infection of the epididymis. This condition can also cause pain and swelling of the testicle and scrotum.  Treatment for this condition depends on the cause. If your condition is caused by a bacterial infection, oral antibiotic medicine may be prescribed.  Inform your sexual partner or partners if you test positive for an STD. They may need to be treated. Do not engage in sexual activity with your partner or partners until their treatment is completed.  Contact a health care provider if your symptoms do not improve within 3 days. This information is not intended to replace advice given to you by your health care provider. Make sure you discuss any  questions you have with your health care provider. Document Revised: 06/05/2018 Document Reviewed: 06/06/2018 Elsevier Patient Education  2021 [employer] Inc.  

## 2020-12-17 NOTE — Telephone Encounter (Signed)
Patient returned the call and left a message on the voice mail.

## 2020-12-17 NOTE — Telephone Encounter (Signed)
See Mychart note 

## 2020-12-20 LAB — URINE CULTURE

## 2020-12-21 IMAGING — DX DG SHOULDER 2+V*L*
4 series · 4 of 4 positions shown · non-contrast
Comparison: None.

CLINICAL DATA: Bilateral shoulder pain

EXAM:
LEFT SHOULDER - 2+ VIEW

[shoulder ap]
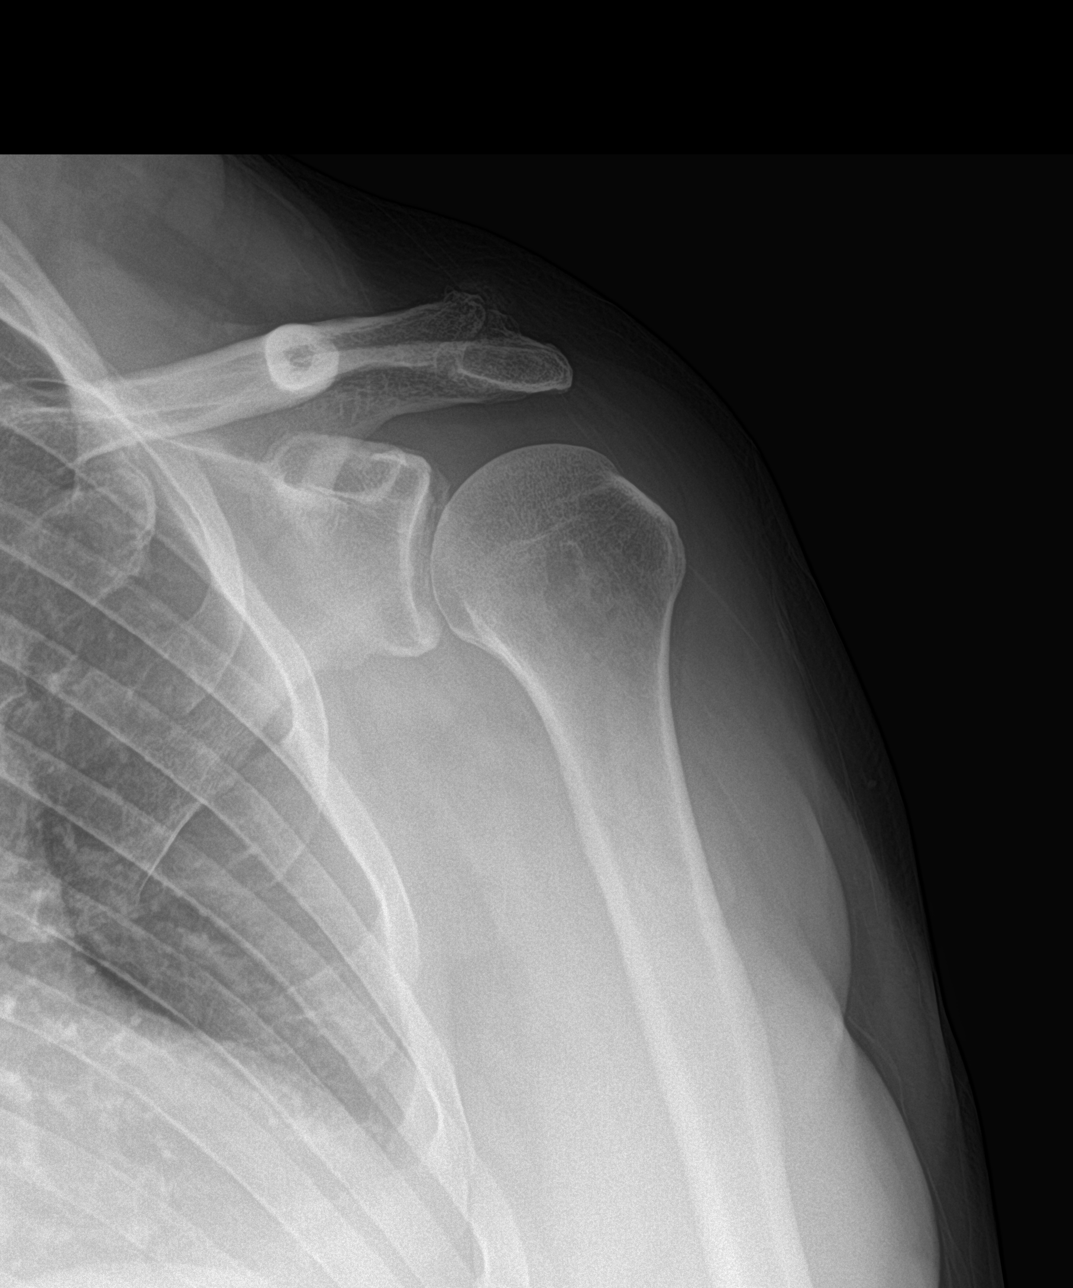

[shoulder y-view (1 of 2)]
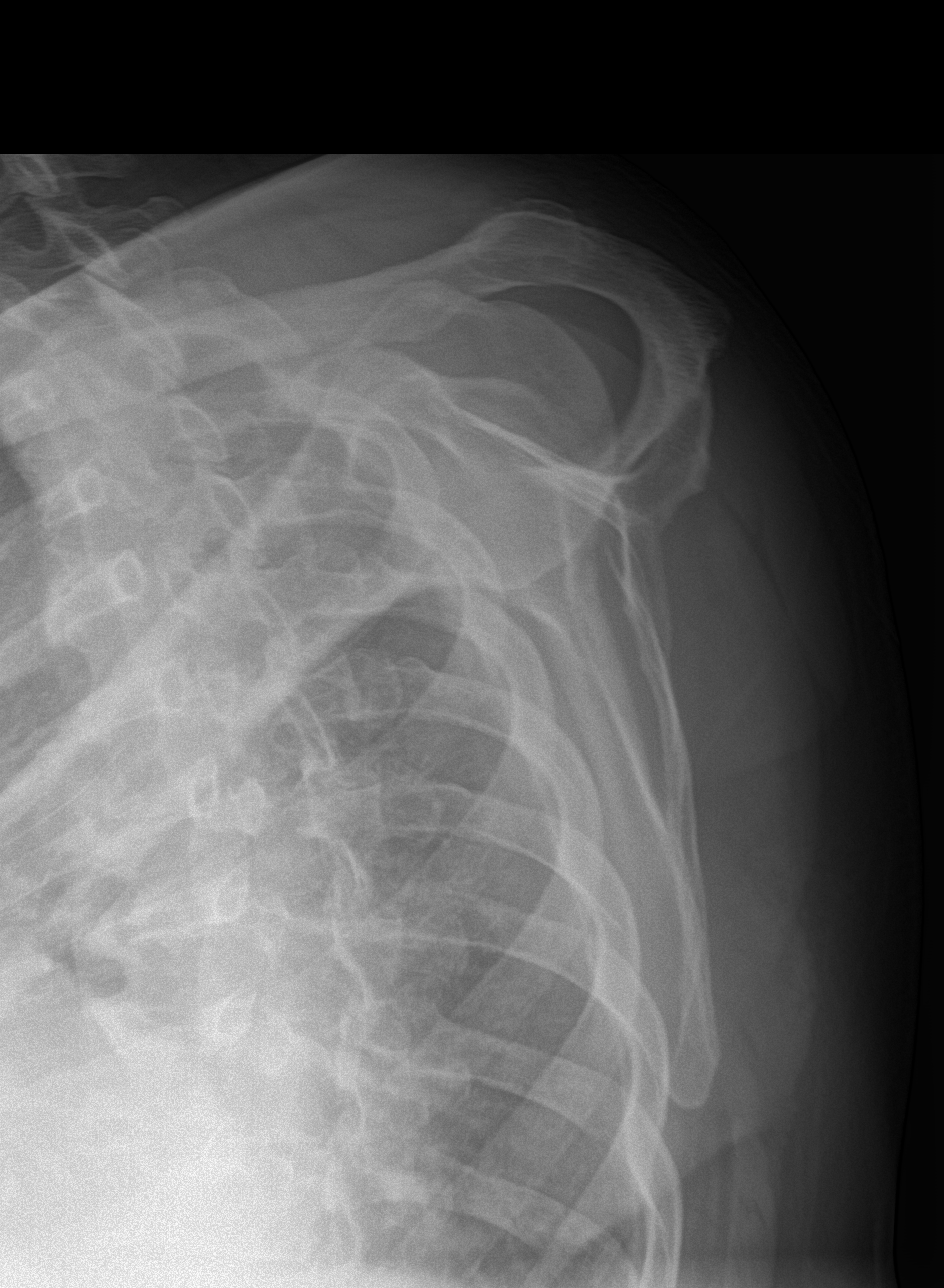

[shoulder axial]
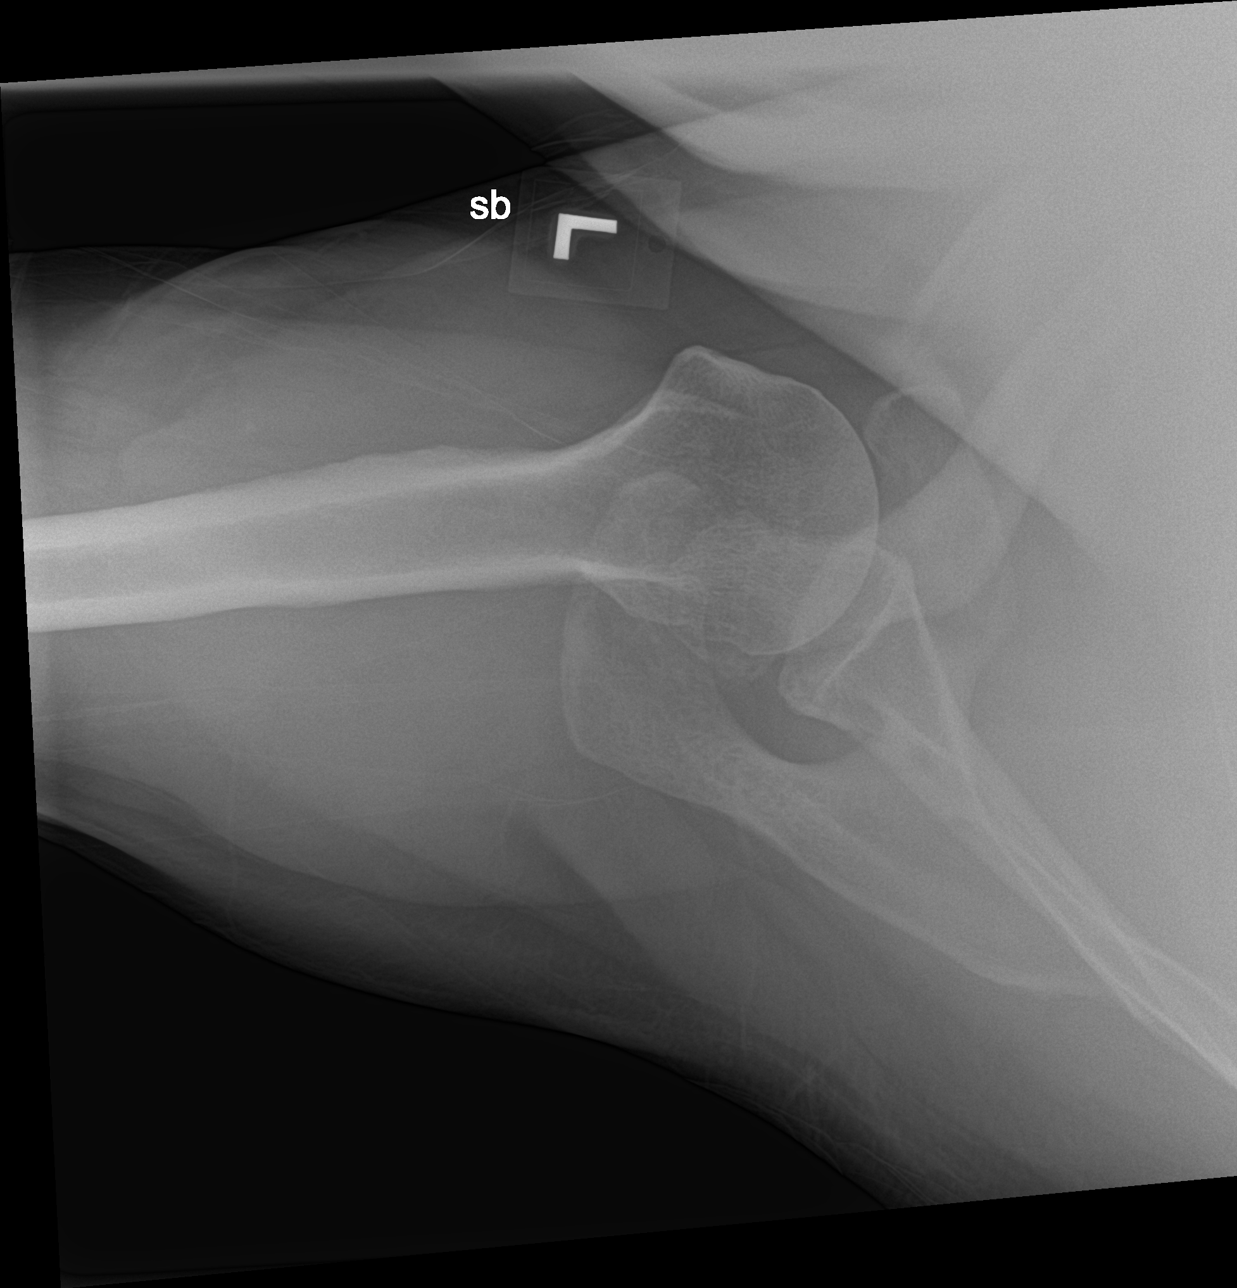

[shoulder y-view (2 of 2)]
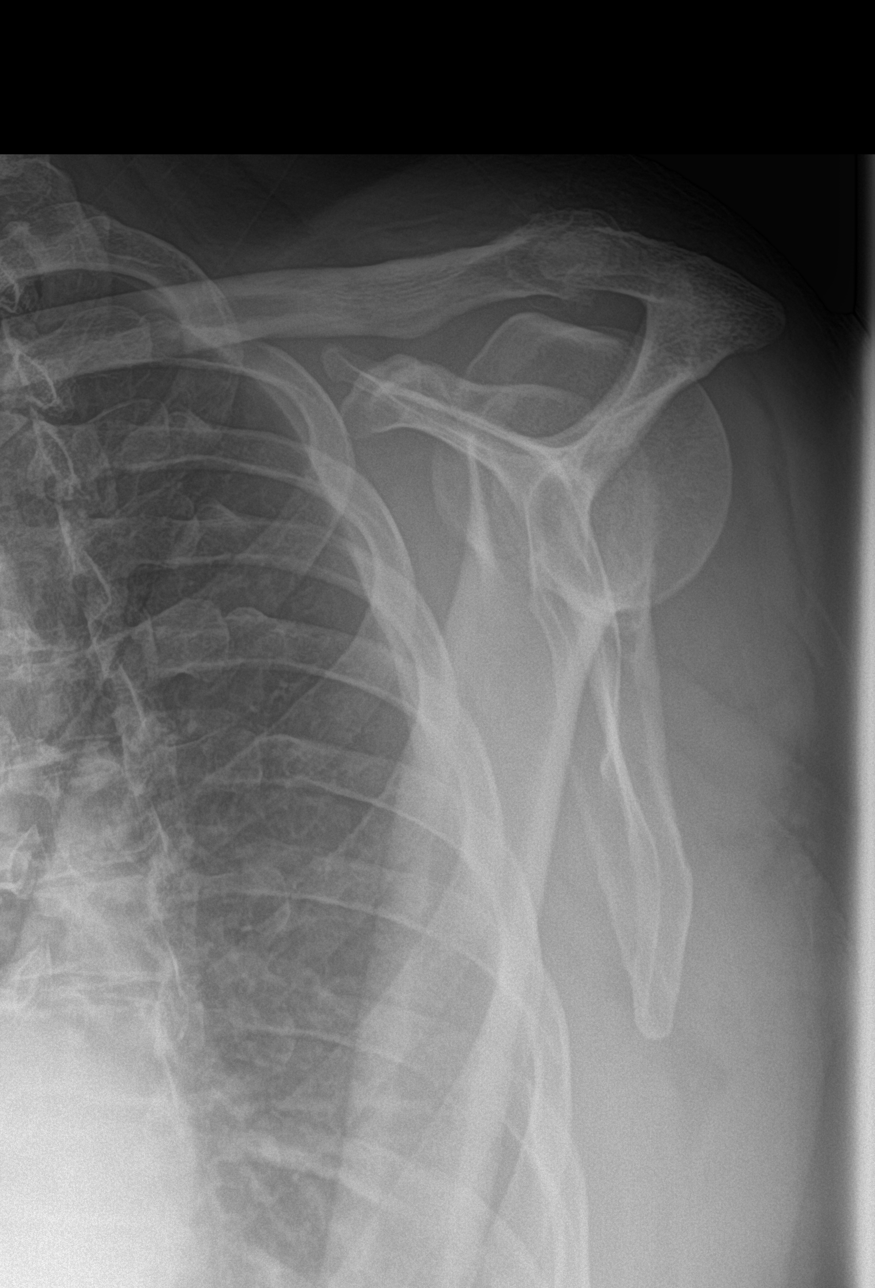

[4 of 4 positions shown; findings below may reference images not displayed]

FINDINGS: Anatomic alignment is maintained. No acute fracture. Joint space is
preserved at the glenohumeral joint. Degenerative changes at the
acromioclavicular joint.
IMPRESSION: No significant osseous abnormality at the left glenohumeral joint.
Degenerative changes at the left acromioclavicular joint.

## 2020-12-21 IMAGING — DX DG CERVICAL SPINE COMPLETE 4+V
4 series · 4 of 4 positions shown · non-contrast
Comparison: None.

CLINICAL DATA: Bilateral shoulder pain

EXAM:
CERVICAL SPINE - COMPLETE 4+ VIEW

[c-spine lat]
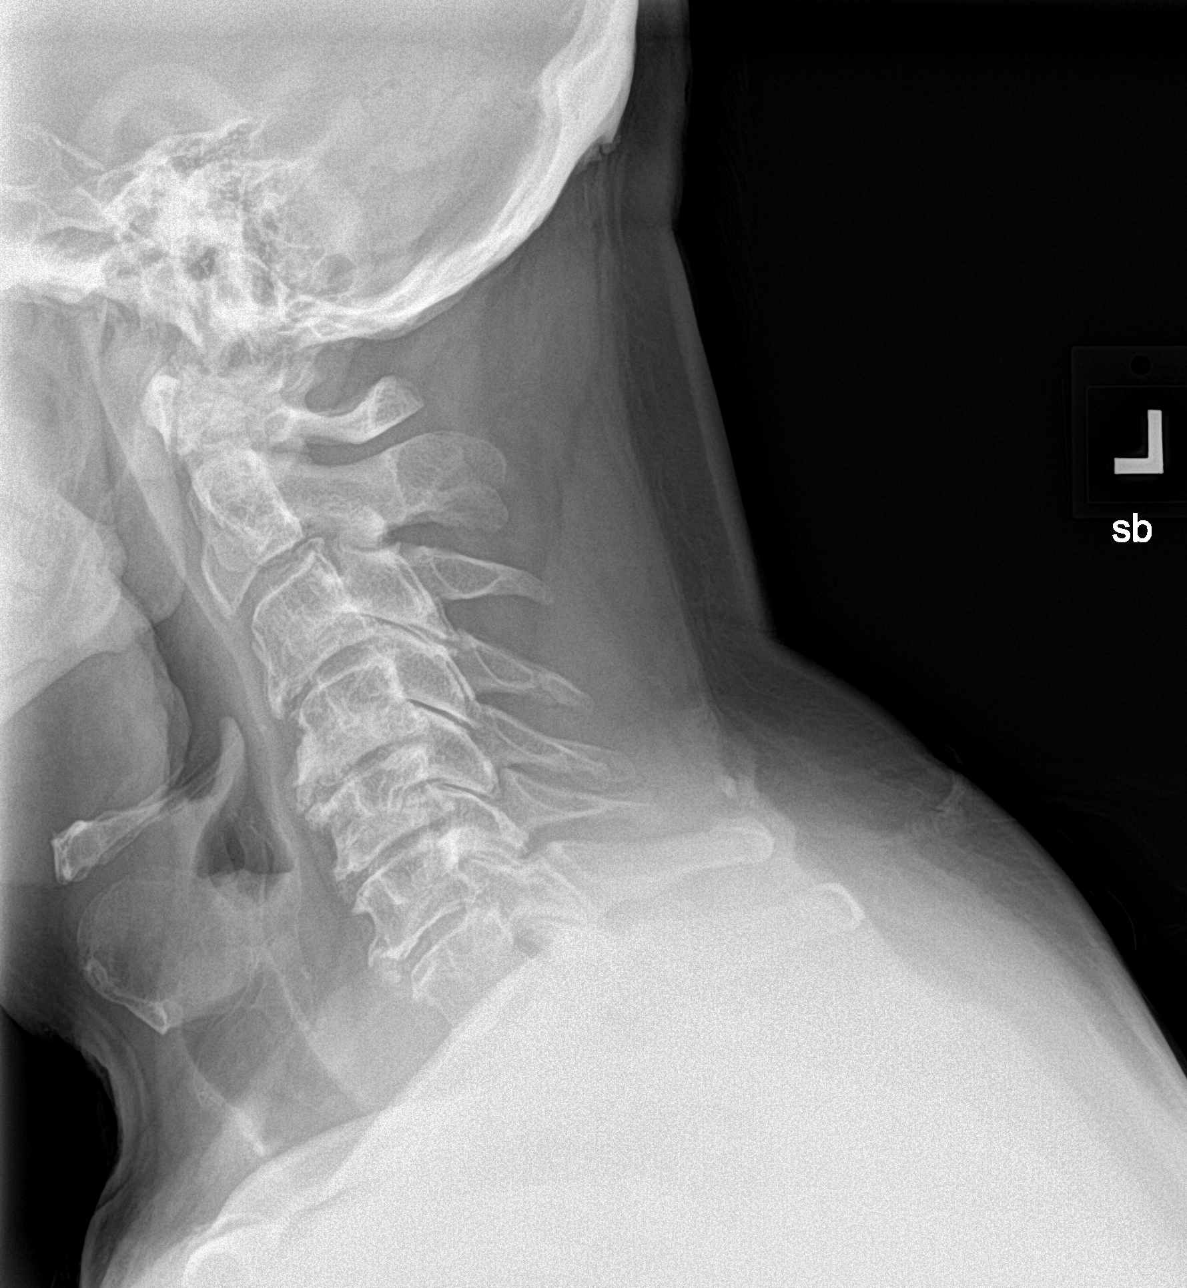

[c-spine obl (1 of 2)]
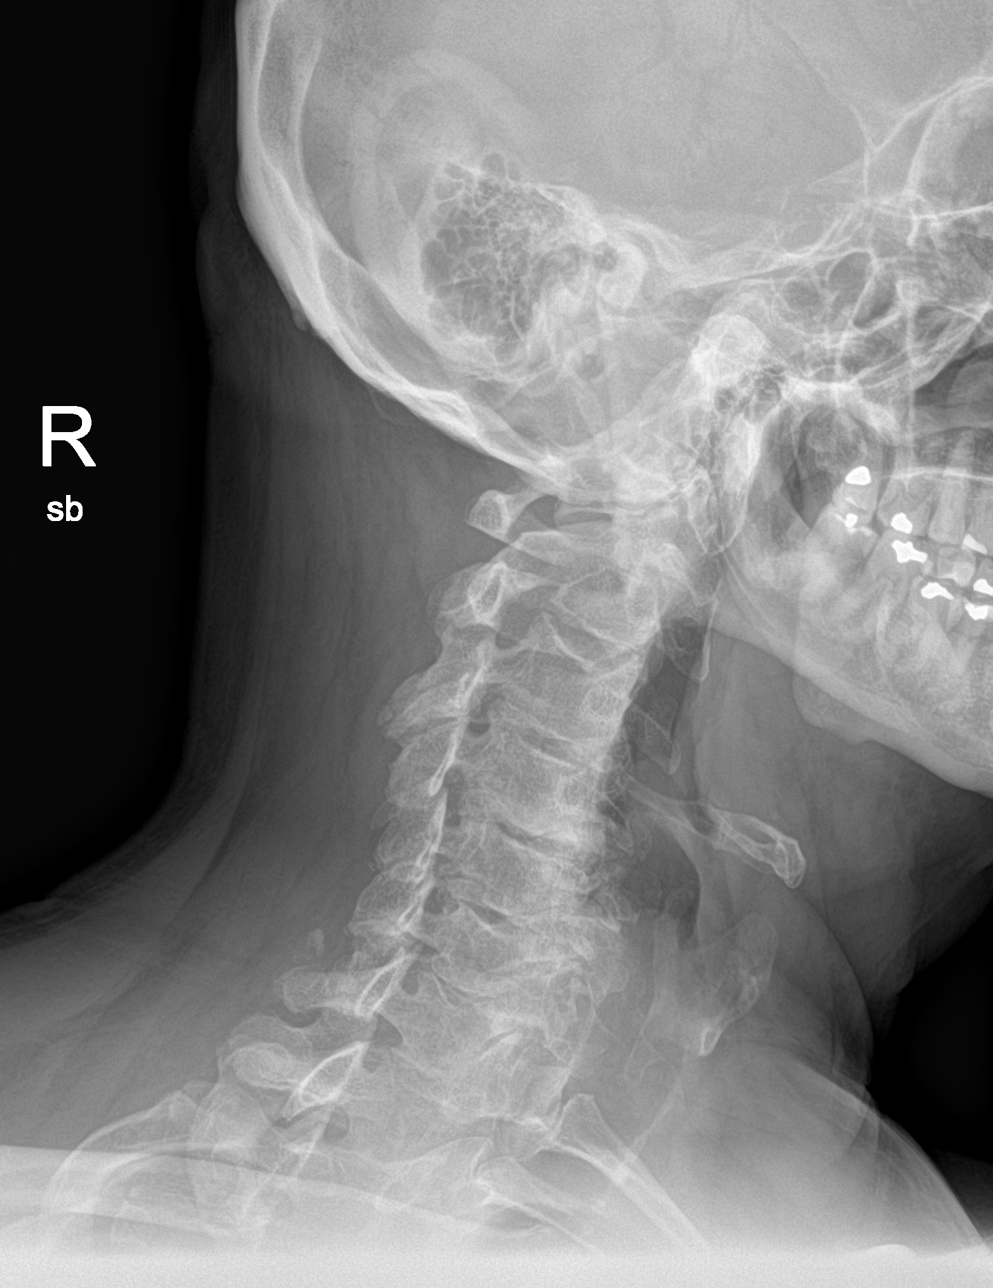

[c-spine obl (2 of 2)]
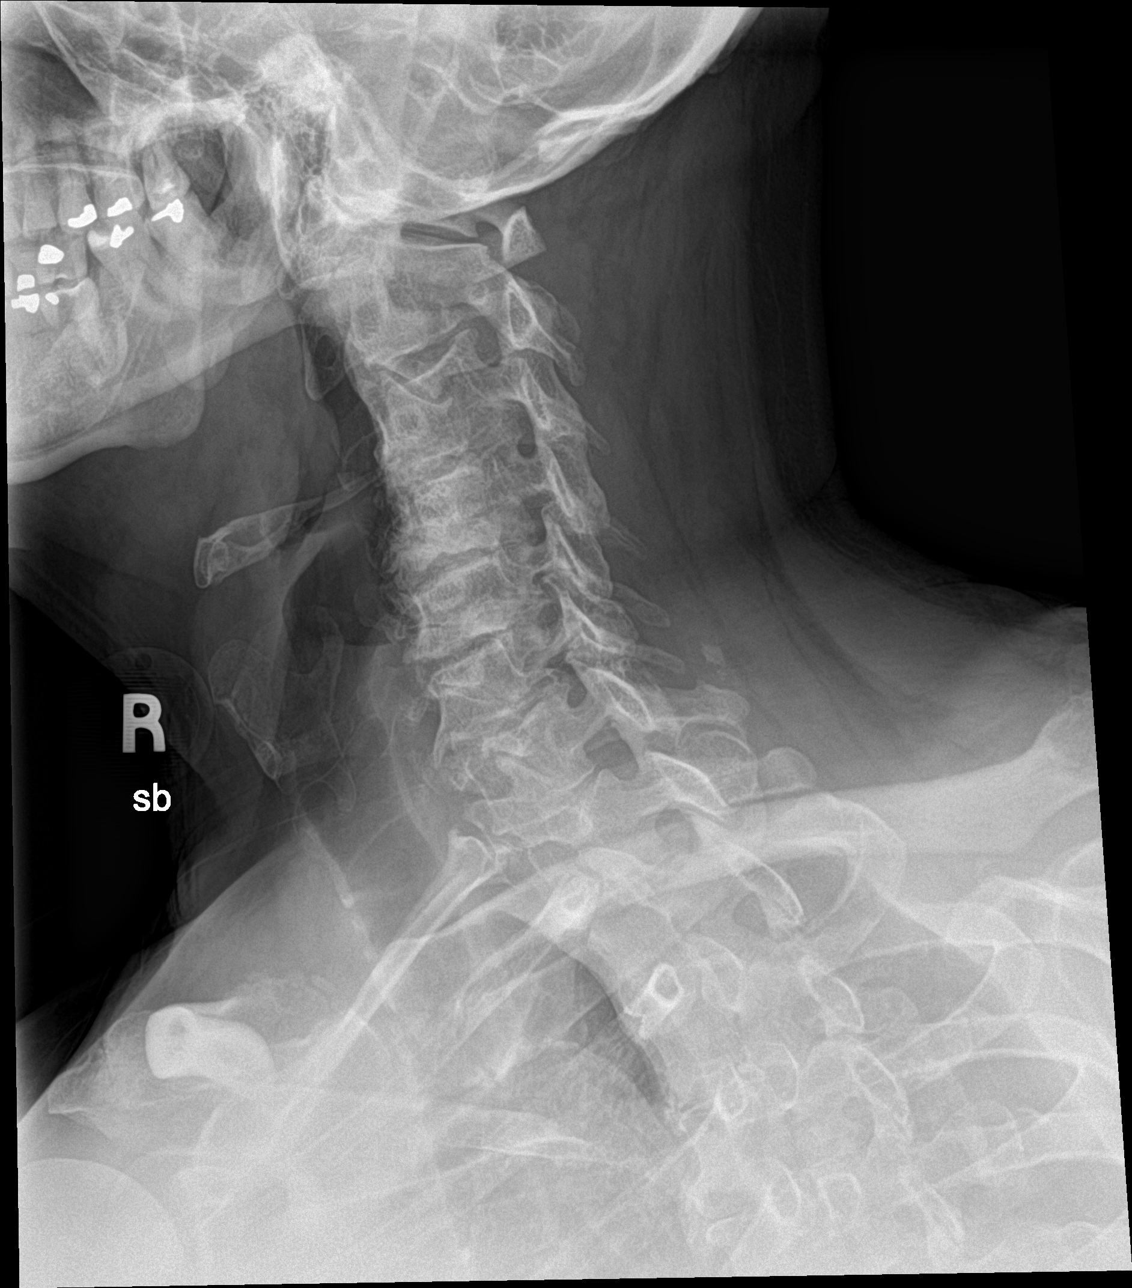

[c-spine open mouth]
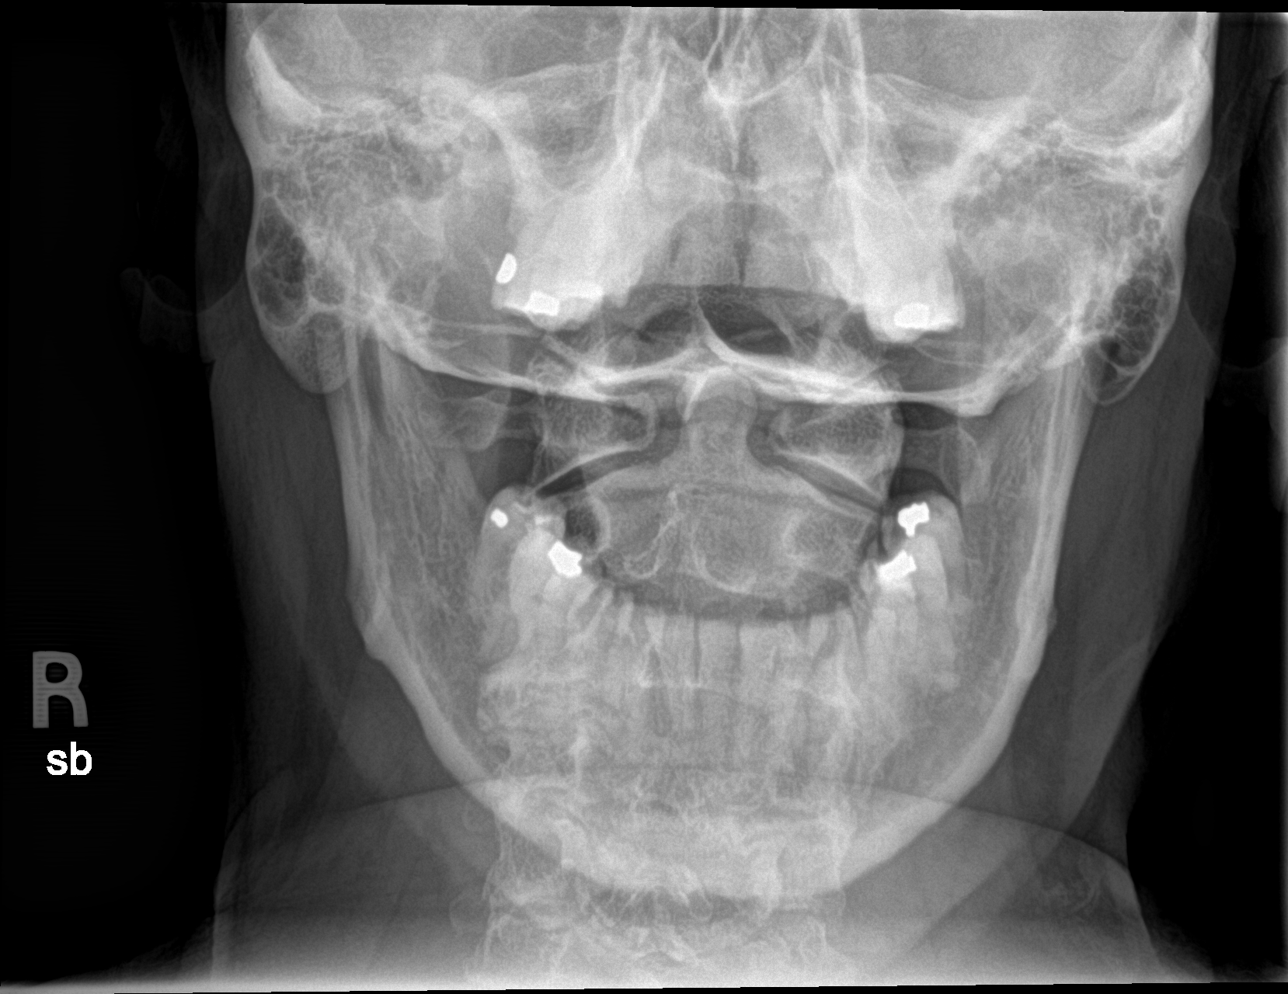

[4 of 4 positions shown; findings below may reference images not displayed]

FINDINGS: No prevertebral soft tissue swelling. There is mild retrolisthesis
at C4-C5 and C5-C6. Vertebral body heights are maintained apart from
degenerative endplate irregularity. There is diffuse disc space
narrowing with posterior endplate osteophytes. Facet and
uncovertebral joint hypertrophy contribute to multilevel neural
foraminal stenosis.
IMPRESSION: Marked cervical spondylosis.

## 2020-12-30 ENCOUNTER — Telehealth: Payer: Self-pay | Admitting: *Deleted

## 2020-12-30 ENCOUNTER — Encounter: Payer: Self-pay | Admitting: Urology

## 2020-12-30 NOTE — Telephone Encounter (Signed)
Patient had sent 2 my chart message today . He state his testicle is still  swollen and he just finished his Augmentin   this weekend. It feels better this afternoon.  He is still using ice.

## 2020-12-30 NOTE — Telephone Encounter (Signed)
See telephone encounter.

## 2020-12-30 NOTE — Telephone Encounter (Signed)
Spoke with patient and notified him that he should follow up with [facility] per Dr. Erlene Quan on Friday. He is agreeable to this plan, he will call back for a sooner appt if his pain/swelling worsens or if he develops new worsening symptoms such as fever or chills

## 2020-12-30 NOTE — Telephone Encounter (Signed)
Recommended he follow-up in Friday with [facility] for reassessment as scheduled.    Hollice Espy, MD

## 2021-01-01 NOTE — Progress Notes (Signed)
01/02/2021 4:43 PM   Derrick Ball. April 03, 1956 [id number]  Referring provider: Valerie Roys, DO [place],  [place] [address]  Chief Complaint  Patient presents with  . Testicle Pain   Urological history: 1. Incomplete bladder emptying -He has a personal history of a TURP and urethral stricture. He is now being followed by [facility] urology where he is undergone urodynamics.He is scheduled to have an InterStim device placed by Dr. Westly Pam on 12/23/2020 and 01/06/2021 -currently cathing three times daily   2. Orchitis and epididymitis -had episode in 2021  HPI: Derrick Sandoval. is a 65 y.o. male who presents today for left epididymo-orchitis recheck.    He states he is doing better.  The penile discharge has abated on the scrotum has decreased in swelling, but he states it is still slightly tender.  He finished his Augmentin on Saturday or Sunday.  He may have missed a day or two, but he took all the antibiotics.  His urine culture was positive for E.coli that is sensitive to Augmentin.  He is scheduled for InterStim, but he cancelled the procedure due to the infection and transportation issues.  UA nitrite positive, > 30 WBC's and many bacteria.    Patient denies any modifying or aggravating factors.  Patient denies any gross hematuria, dysuria or suprapubic/flank pain.  Patient denies any fevers, chills, nausea or vomiting.   PMH: Past Medical History:  Diagnosis Date  . Allergy   . Arthritis   . Cough    nonproductive no fever saw at dr 05-26-18  . GERD (gastroesophageal reflux disease)   . Hyperlipidemia   . Hypertension   . Meningitis due to viruses 1968    Surgical History: Past Surgical History:  Procedure Laterality Date  . COLONOSCOPY WITH PROPOFOL N/A 05/12/2018   Procedure: COLONOSCOPY WITH PROPOFOL;  Surgeon: Jonathon Bellows, MD;  Location: [facility] ENDOSCOPY;  Service: Gastroenterology;  Laterality: N/A;  . ESOPHAGOGASTRODUODENOSCOPY (EGD) WITH  PROPOFOL N/A 05/12/2018   Procedure: ESOPHAGOGASTRODUODENOSCOPY (EGD) WITH PROPOFOL;  Surgeon: Jonathon Bellows, MD;  Location: [facility] ENDOSCOPY;  Service: Gastroenterology;  Laterality: N/A;  . ESOPHAGOGASTRODUODENOSCOPY (EGD) WITH PROPOFOL N/A 06/01/2018   Procedure: ESOPHAGOGASTRODUODENOSCOPY (EGD) WITH PROPOFOL;  Surgeon: Milus Banister, MD;  Location: [facility] ENDOSCOPY;  Service: Endoscopy;  Laterality: N/A;  . EUS N/A 06/01/2018   Procedure: UPPER ENDOSCOPIC ULTRASOUND (EUS) RADIAL;  Surgeon: Milus Banister, MD;  Location: [facility] ENDOSCOPY;  Service: Endoscopy;  Laterality: N/A;  . JOINT REPLACEMENT Bilateral   . NASAL SINUS SURGERY    . TOTAL KNEE ARTHROPLASTY      Home Medications:  Allergies as of 01/02/2021   No Known Allergies     Medication List       Accurate as of Jan 02, 2021 11:59 PM. If you have any questions, ask your nurse or doctor.        STOP taking these medications   amoxicillin-clavulanate 875-125 MG tablet Commonly known as: AUGMENTIN Stopped by: Zara Council, PA-C     TAKE these medications   acyclovir 400 MG tablet Commonly known as: ZOVIRAX Take 1 tablet (400 mg total) by mouth 2 (two) times daily.   albuterol 108 (90 Base) MCG/ACT inhaler Commonly known as: VENTOLIN HFA Inhale 1-2 puffs into the lungs every 6 (six) hours as needed for wheezing or shortness of breath.   aspirin EC 81 MG tablet Take 81 mg by mouth daily.   cholecalciferol 1000 units tablet Commonly known as: VITAMIN  D Take 1,000 Units by mouth daily.   cyclobenzaprine 10 MG tablet Commonly known as: FLEXERIL Take 1 tablet (10 mg total) by mouth 3 (three) times daily as needed for muscle spasms.   doxycycline 100 MG capsule Commonly known as: VIBRAMYCIN Take 1 capsule (100 mg total) by mouth every 12 (twelve) hours. Started by: Zara Council, PA-C   famotidine 20 MG tablet Commonly known as: PEPCID Take 1 tablet (20 mg total) by mouth 2 (two) times daily.   fluticasone 50  MCG/ACT nasal spray Commonly known as: FLONASE Place 1 spray into both nostrils 2 (two) times daily.   guaiFENesin 600 MG 12 hr tablet Commonly known as: Mucinex Take 1 tablet (600 mg total) by mouth 2 (two) times daily as needed for cough or to loosen phlegm.   lisinopril 20 MG tablet Commonly known as: ZESTRIL Take 1 tablet (20 mg total) by mouth daily.   loratadine 10 MG tablet Commonly known as: CLARITIN Take 1 tablet (10 mg total) by mouth daily.   lubiprostone 8 MCG capsule Commonly known as: Amitiza Take 1 capsule (8 mcg total) by mouth 2 (two) times daily with a meal.   montelukast 10 MG tablet Commonly known as: SINGULAIR Take 1 tablet (10 mg total) by mouth at bedtime.   multivitamin with minerals tablet Take 1 tablet by mouth daily.   naproxen 500 MG tablet Commonly known as: Naprosyn Take 1 tablet (500 mg total) by mouth 2 (two) times daily with a meal.   naproxen sodium 220 MG tablet Commonly known as: ALEVE Take 220 mg by mouth daily as needed.   nystatin cream Commonly known as: MYCOSTATIN Apply 1 application topically 2 (two) times daily.   omeprazole 40 MG capsule Commonly known as: PRILOSEC Take 1 capsule by mouth once daily   tadalafil 5 MG tablet Commonly known as: CIALIS TAKE 1 TABLET BY MOUTH ONCE DAILY AS NEEDED FOR BPH   triamcinolone ointment 0.1 % Commonly known as: KENALOG Apply 1 application topically 2 (two) times daily.   vitamin C 1000 MG tablet Take 1,000 mg by mouth at bedtime.       Allergies: No Known Allergies  Family History: Family History  Problem Relation Age of Onset  . Breast cancer Mother   . Cancer Father        unsure, passed away before it was confirmed  . Pancreatic cancer Daughter   . Diabetes Neg Hx   . Heart disease Neg Hx   . Hypertension Neg Hx   . Stroke Neg Hx   . COPD Neg Hx   . Colon cancer Neg Hx     Social History:  reports that he has never smoked. He has never used smokeless tobacco.  He reports previous alcohol use. He reports that he does not use drugs.  ROS: Pertinent ROS in HPI  Physical Exam: BP 139/85   Pulse 72   Ht 5' 7.25" (1.708 m)   Wt 277 lb (125.6 kg)   BMI 43.06 kg/m   Constitutional:  Well nourished. Alert and oriented, No acute distress. HEENT: [facility] AT, mask in place.  Trachea midline Cardiovascular: No clubbing, cyanosis, or edema. Respiratory: Normal respiratory effort, no increased work of breathing. GI: Abdomen is soft, non tender, non distended, no abdominal masses. Liver and spleen not palpable.  No hernias appreciated.  Stool sample for occult testing is not indicated.   GU: No CVA tenderness.  No bladder fullness or masses.  Patient with buried phallus. Foreskin  easily retracted Urethral meatus is patent.  No penile discharge. No penile lesions or rashes. Scrotum without lesions, cysts, rashes and/or edema.  Testicles are located scrotally bilaterally. No masses are appreciated in the testicles. Right epididymis is normal.  Left testicle and epididymis are indurated and tender associated with a reactive hydrocele.  No crepitus, erythema or fluctuant mass is noted. Neurologic: Grossly intact, no focal deficits, moving all 4 extremities. Psychiatric: Normal mood and affect.  Laboratory Data: Lab Results  Component Value Date   WBC 7.6 10/07/2020   HGB 16.9 10/07/2020   HCT 49.0 10/07/2020   MCV 88 10/07/2020   PLT 178 10/07/2020    Lab Results  Component Value Date   CREATININE 1.04 10/07/2020    Lab Results  Component Value Date   TSH 3.400 10/07/2020       Component Value Date/Time   CHOL 160 10/07/2020 1515   CHOL 135 08/25/2016 0827   CHOL 93 01/17/2012 0644   HDL 37 (L) 10/07/2020 1515   HDL 32 (L) 01/17/2012 0644   VLDL 16 08/25/2016 0827   VLDL 17 01/17/2012 0644   LDLCALC 101 (H) 10/07/2020 1515   LDLCALC 44 01/17/2012 0644    Lab Results  Component Value Date   AST 68 (H) 10/07/2020   Lab Results  Component  Value Date   ALT 83 (H) 10/07/2020    Urinalysis Component     Latest Ref Rng & Units 01/02/2021  Specific Gravity, UA     1.005 - 1.030 1.015  pH, UA     5.0 - 7.5 5.5  Color, UA     Yellow Yellow  Appearance Ur     Clear Cloudy (A)  Leukocytes,UA     Negative 2+ (A)  Protein,UA     Negative/Trace Negative  Glucose, UA     Negative Negative  Ketones, UA     Negative Negative  RBC, UA     Negative Trace (A)  Bilirubin, UA     Negative Negative  Urobilinogen, Ur     0.2 - 1.0 mg/dL 0.2  Nitrite, UA     Negative Positive (A)  Microscopic Examination      See below:   Component     Latest Ref Rng & Units 01/02/2021          WBC, UA     0 - 5 /hpf >30 (A)  RBC     0 - 2 /hpf 0-2  Epithelial Cells (non renal)     0 - 10 /hpf 0-10  Bacteria, UA     None seen/Few Many (A)  I have reviewed the labs.   Pertinent Imaging: No imaging since last visit with [place]  Assessment & Plan:    1. Left epididymo-orchitis -UA remains grossly infected -will start doxycycline -urine is sent for culture - he has a history of MDRO, so we may consider ID referral pending this culture result  Return in about 2 weeks (around 01/16/2021) for recheck .  These notes generated with voice recognition software. I apologize for typographical errors.  Zara Council, PA-C  [facility] [address]  [place], [place] [address] [phone]

## 2021-01-02 ENCOUNTER — Other Ambulatory Visit: Payer: Self-pay

## 2021-01-02 ENCOUNTER — Encounter: Payer: Self-pay | Admitting: Urology

## 2021-01-02 ENCOUNTER — Ambulatory Visit (INDEPENDENT_AMBULATORY_CARE_PROVIDER_SITE_OTHER): Payer: PPO | Admitting: Urology

## 2021-01-02 VITALS — BP 139/85 | HR 72 | Ht 67.25 in | Wt 277.0 lb

## 2021-01-02 DIAGNOSIS — N453 Epididymo-orchitis: Secondary | ICD-10-CM

## 2021-01-02 LAB — URINALYSIS, COMPLETE
Bilirubin, UA: NEGATIVE
Glucose, UA: NEGATIVE
Ketones, UA: NEGATIVE
Nitrite, UA: POSITIVE — AB
Protein,UA: NEGATIVE
Specific Gravity, UA: 1.015 (ref 1.005–1.030)
Urobilinogen, Ur: 0.2 mg/dL (ref 0.2–1.0)
pH, UA: 5.5 (ref 5.0–7.5)

## 2021-01-02 LAB — MICROSCOPIC EXAMINATION: WBC, UA: >30 /hpf — AB (ref 0–5)

## 2021-01-02 MED ORDER — DOXYCYCLINE HYCLATE 100 MG PO CAPS: 100.0000 mg | ORAL_CAPSULE | Freq: Two times a day (BID) | ORAL | 0 refills | Status: DC

## 2021-01-07 LAB — CULTURE, URINE COMPREHENSIVE

## 2021-01-14 ENCOUNTER — Ambulatory Visit (INDEPENDENT_AMBULATORY_CARE_PROVIDER_SITE_OTHER): Payer: PPO | Admitting: Pharmacist

## 2021-01-14 DIAGNOSIS — I10 Essential (primary) hypertension: Secondary | ICD-10-CM

## 2021-01-14 DIAGNOSIS — E782 Mixed hyperlipidemia: Secondary | ICD-10-CM | POA: Diagnosis not present

## 2021-01-14 NOTE — Patient Instructions (Addendum)
Visit Information  It was a pleasure speaking with you today. Thank you for letting me be part of your clinical team. Please call with any questions or concerns.   Goals Addressed            This Visit's Progress   . Manage Pain-Osteoarthritis       Timeframe:  Long-Range Goal Priority:  Medium Start Date:                             Expected End Date:                       Follow Up Date 3 month follow up    - prioritize tasks for the day - use ice or heat for pain relief - work slower and less intense when having pain    Why is this important?    Day-to-day life can be hard when you have joint pain.   Pain medicine is just one piece of the treatment puzzle. There are many things you can do to manage pain and stay strong.    Lifestyle changes like stopping smoking and eating foods with Vitamin D and calcium keeps your bones and muscles healthy. Your joints are better when supported by strong muscles.   You can try these action steps to help you manage your pain.     Notes:     . Track and Manage My Blood Pressure-Hypertension   Not on track    Timeframe:  Long-Range Goal Priority:  Medium Start Date:                             Expected End Date:                       Follow Up Date 3 month follow up   - check blood pressure 3 times per week    Why is this important?    You won't feel high blood pressure, but it can still hurt your blood vessels.   High blood pressure can cause heart or kidney problems. It can also cause a stroke.   Making lifestyle changes like losing a little weight or eating less salt will help.   Checking your blood pressure at home and at different times of the day can help to control blood pressure.   If the doctor prescribes medicine remember to take it the way the doctor ordered.   Call the office if you cannot afford the medicine or if there are questions about it.     Notes:        The patient verbalized understanding of  instructions, educational materials, and care plan provided today and declined offer to receive copy of patient instructions, educational materials, and care plan.   Telephone follow up appointment with pharmacy team member scheduled for: 80months pharmD, 4-6 weeks CPA  Junita Push. Shion Bluestein PharmD, BCPS Clinical Pharmacist [phone]  Managing Your Hypertension Hypertension, also called high blood pressure, is when the force of the blood pressing against the walls of the arteries is too strong. Arteries are blood vessels that carry blood from your heart throughout your body. Hypertension forces the heart to work harder to pump blood and may cause the arteries to become narrow or stiff. Understanding blood pressure readings Your personal target blood pressure may vary depending on your medical conditions,  your age, and other factors. A blood pressure reading includes a higher number over a lower number. Ideally, your blood pressure should be below 120/80. You should know that:  The first, or top, number is called the systolic pressure. It is a measure of the pressure in your arteries as your heart beats.  The second, or bottom number, is called the diastolic pressure. It is a measure of the pressure in your arteries as the heart relaxes. Blood pressure is classified into four stages. Based on your blood pressure reading, your health care provider may use the following stages to determine what type of treatment you need, if any. Systolic pressure and diastolic pressure are measured in a unit called mmHg. Normal  Systolic pressure: below 951.  Diastolic pressure: below 80. Elevated  Systolic pressure: 884-166.  Diastolic pressure: below 80. Hypertension stage 1  Systolic pressure: 063-016.  Diastolic pressure: 01-09. Hypertension stage 2  Systolic pressure: 323 or above.  Diastolic pressure: 90 or above. How can this condition affect me? Managing your hypertension is an important  responsibility. Over time, hypertension can damage the arteries and decrease blood flow to important parts of the body, including the brain, heart, and kidneys. Having untreated or uncontrolled hypertension can lead to:  A heart attack.  A stroke.  A weakened blood vessel (aneurysm).  Heart failure.  Kidney damage.  Eye damage.  Metabolic syndrome.  Memory and concentration problems.  Vascular dementia. What actions can I take to manage this condition? Hypertension can be managed by making lifestyle changes and possibly by taking medicines. Your health care provider will help you make a plan to bring your blood pressure within a normal range. Nutrition  Eat a diet that is high in fiber and potassium, and low in salt (sodium), added sugar, and fat. An example eating plan is called the Dietary Approaches to Stop Hypertension (DASH) diet. To eat this way: ? Eat plenty of fresh fruits and vegetables. Try to fill one-half of your plate at each meal with fruits and vegetables. ? Eat whole grains, such as whole-wheat pasta, brown rice, or whole-grain bread. Fill about one-fourth of your plate with whole grains. ? Eat low-fat dairy products. ? Avoid fatty cuts of meat, processed or cured meats, and poultry with skin. Fill about one-fourth of your plate with lean proteins such as fish, chicken without skin, beans, eggs, and tofu. ? Avoid pre-made and processed foods. These tend to be higher in sodium, added sugar, and fat.  Reduce your daily sodium intake. Most people with hypertension should eat less than 1,500 mg of sodium a day.   Lifestyle  Work with your health care provider to maintain a healthy body weight or to lose weight. Ask what an ideal weight is for you.  Get at least 30 minutes of exercise that causes your heart to beat faster (aerobic exercise) most days of the week. Activities may include walking, swimming, or biking.  Include exercise to strengthen your muscles  (resistance exercise), such as weight lifting, as part of your weekly exercise routine. Try to do these types of exercises for 30 minutes at least 3 days a week.  Do not use any products that contain nicotine or tobacco, such as cigarettes, e-cigarettes, and chewing tobacco. If you need help quitting, ask your health care provider.  Control any long-term (chronic) conditions you have, such as high cholesterol or diabetes.  Identify your sources of stress and find ways to manage stress. This may include meditation, deep  breathing, or making time for fun activities.   Alcohol use  Do not drink alcohol if: ? Your health care provider tells you not to drink. ? You are pregnant, may be pregnant, or are planning to become pregnant.  If you drink alcohol: ? Limit how much you use to:  0-1 drink a day for women.  0-2 drinks a day for men. ? Be aware of how much alcohol is in your drink. In the [place] one drink equals one 12 oz bottle of beer (355 mL), one 5 oz glass of wine (148 mL), or one 1 oz glass of hard liquor (44 mL). Medicines Your health care provider may prescribe medicine if lifestyle changes are not enough to get your blood pressure under control and if:  Your systolic blood pressure is 130 or higher.  Your diastolic blood pressure is 80 or higher. Take medicines only as told by your health care provider. Follow the directions carefully. Blood pressure medicines must be taken as told by your health care provider. The medicine does not work as well when you skip doses. Skipping doses also puts you at risk for problems. Monitoring Before you monitor your blood pressure:  Do not smoke, drink caffeinated beverages, or exercise within 30 minutes before taking a measurement.  Use the bathroom and empty your bladder (urinate).  Sit quietly for at least 5 minutes before taking measurements. Monitor your blood pressure at home as told by your health care provider. To do this:  Sit  with your back straight and supported.  Place your feet flat on the floor. Do not cross your legs.  Support your arm on a flat surface, such as a table. Make sure your upper arm is at heart level.  Each time you measure, take two or three readings one minute apart and record the results. You may also need to have your blood pressure checked regularly by your health care provider.   General information  Talk with your health care provider about your diet, exercise habits, and other lifestyle factors that may be contributing to hypertension.  Review all the medicines you take with your health care provider because there may be side effects or interactions.  Keep all visits as told by your health care provider. Your health care provider can help you create and adjust your plan for managing your high blood pressure. Where to find more information  National Heart, Lung, and Blood Institute: https://wilson-eaton.com/  American Heart Association: www.heart.org Contact a health care provider if:  You think you are having a reaction to medicines you have taken.  You have repeated (recurrent) headaches.  You feel dizzy.  You have swelling in your ankles.  You have trouble with your vision. Get help right away if:  You develop a severe headache or confusion.  You have unusual weakness or numbness, or you feel faint.  You have severe pain in your chest or abdomen.  You vomit repeatedly.  You have trouble breathing. These symptoms may represent a serious problem that is an emergency. Do not wait to see if the symptoms will go away. Get medical help right away. Call your local emergency services (911 in the [place] Do not drive yourself to the hospital. Summary  Hypertension is when the force of blood pumping through your arteries is too strong. If this condition is not controlled, it may put you at risk for serious complications.  Your personal target blood pressure may vary depending on  your medical conditions, your  age, and other factors. For most people, a normal blood pressure is less than 120/80.  Hypertension is managed by lifestyle changes, medicines, or both.  Lifestyle changes to help manage hypertension include losing weight, eating a healthy, low-sodium diet, exercising more, stopping smoking, and limiting alcohol. This information is not intended to replace advice given to you by your health care provider. Make sure you discuss any questions you have with your health care provider. Document Revised: 09/07/2019 Document Reviewed: 07/03/2019 Elsevier Patient Education  2021 [employer].

## 2021-01-14 NOTE — Progress Notes (Signed)
Chronic Care Management Pharmacy Note  01/14/2021 Name:  Derrick Sandoval. MRN:  [id number] DOB:  [birth year]  Subjective: Derrick Sandoval. is an 65 y.o. year old male who is a primary patient of Valerie Roys, DO.  The CCM team was consulted for assistance with disease management and care coordination needs.    Engaged with patient by telephone for follow up visit in response to provider referral for pharmacy case management and/or care coordination services.   Consent to Services:  The patient was given information about Chronic Care Management services, agreed to services, and gave verbal consent prior to initiation of services.  Please see initial visit note for detailed documentation.   Patient Care Team: Valerie Roys, DO as PCP - General (Family Medicine) Delgaizo, Lavon Paganini, MD as Referring Physician (Orthopedic Surgery) Viprakasit, Marlowe Shores, MD as Referring Physician (Urology) Vladimir Faster, [facility] (Pharmacist)  Recent office visits: 12/16/20-Johnson(PCP)-ua, cx, scrotal ultrasound, UCX R cipro 12/04/20-Johnson (PCP)- penile discharge, hsv positive, no culture cipro 500 mg bid x21 d 10/07/20-Dr. Fredia Beets work--UTI cipro 7 day  Recent consult visits: 01/02/21-McGowan, PA-C(urology)- orchitis, epididymitis-uti e coli doxy x 7 days retuen 2 weeks 12/17/20- Rosalin Hawking)- left epididymmo-orchitis augmenti 875 bid x 10 d 10/03/20-Dr. Erlene Quan, urology--caths 3x per day, upcoming cystoscopy and interstem placement  Hospital visits: None in previous 6 months  Objective:  Lab Results  Component Value Date   CREATININE 1.04 10/07/2020   BUN 15 10/07/2020   GFRNONAA 77 10/07/2020   GFRAA 89 10/07/2020   NA 140 10/07/2020   K 4.4 10/07/2020   CALCIUM 9.5 10/07/2020   CO2 20 10/07/2020    Lab Results  Component Value Date/Time   HGBA1C 5.8 04/21/2018 09:39 AM   HGBA1C 5.3 12/13/2016 09:56 AM   HGBA1C 5.7 01/16/2012 12:39 PM   MICROALBUR 30 (H) 10/07/2020  03:10 PM   MICROALBUR 10 07/23/2019 03:28 PM    Last diabetic Eye exam: No results found for: HMDIABEYEEXA  Last diabetic Foot exam: No results found for: HMDIABFOOTEX   Lab Results  Component Value Date   CHOL 160 10/07/2020   HDL 37 (L) 10/07/2020   LDLCALC 101 (H) 10/07/2020   TRIG 123 10/07/2020    Hepatic Function Latest Ref Rng & Units 10/07/2020 07/23/2019 10/25/2018  Total Protein 6.0 - 8.5 g/dL 7.0 7.1 7.0  Albumin 3.8 - 4.8 g/dL 4.7 4.4 4.7  AST 0 - 40 IU/L 68(H) 40 49(H)  ALT 0 - 44 IU/L 83(H) 57(H) 64(H)  Alk Phosphatase 44 - 121 IU/L 75 64 61  Total Bilirubin 0.0 - 1.2 mg/dL 0.4 0.3 0.4    Lab Results  Component Value Date/Time   TSH 3.400 10/07/2020 03:15 PM   TSH 2.940 07/23/2019 03:45 PM   FREET4 0.99 08/04/2015 03:53 PM    CBC Latest Ref Rng & Units 10/07/2020 07/23/2019 10/25/2018  WBC 3.4 - 10.8 x10E3/uL 7.6 6.3 5.7  Hemoglobin 13.0 - 17.7 g/dL 16.9 16.1 15.8  Hematocrit 37.5 - 51.0 % 49.0 47.5 47.8  Platelets 150 - 450 x10E3/uL 178 228 196    Lab Results  Component Value Date/Time   VD25OH 30.7 10/07/2020 03:15 PM   VD25OH 27.9 (L) 07/23/2019 03:45 PM    Clinical ASCVD: No  The 10-year ASCVD risk score Mikey Bussing DC Jr., et al., 2013) is: 14.8%   Values used to calculate the score:     Age: 37 years     Sex: Male     Is  Non-Hispanic African American: No     Diabetic: No     Tobacco smoker: No     Systolic Blood Pressure: 409 mmHg     Is BP treated: Yes     HDL Cholesterol: 37 mg/dL     Total Cholesterol: 160 mg/dL    Depression screen [facility] 2/9 10/07/2020 06/16/2020 09/18/2019  Decreased Interest 0 3 3  Down, Depressed, Hopeless 0 0 1  PHQ - 2 Score 0 3 4  Altered sleeping - 3 3  Tired, decreased energy - 3 3  Change in appetite - 1 3  Feeling bad or failure about yourself  - 1 0  Trouble concentrating - 3 0  Moving slowly or fidgety/restless - 0 0  Suicidal thoughts - 0 0  PHQ-9 Score - 14 13  Difficult doing work/chores - Somewhat difficult -   Some recent data might be hidden      Social History   Tobacco Use  Smoking Status Never Smoker  Smokeless Tobacco Never Used   BP Readings from Last 3 Encounters:  01/02/21 139/85  12/17/20 131/89  12/16/20 100/65   Pulse Readings from Last 3 Encounters:  01/02/21 72  12/17/20 74  12/16/20 87   Wt Readings from Last 3 Encounters:  01/02/21 277 lb (125.6 kg)  12/16/20 277 lb (125.6 kg)  12/04/20 277 lb (125.6 kg)    Assessment/Interventions: Review of patient past medical history, allergies, medications, health status, including review of consultants reports, laboratory and other test data, was performed as part of comprehensive evaluation and provision of chronic care management services.   SDOH:  (Social Determinants of Health) assessments and interventions performed: No     Immunization History  Administered Date(s) Administered  . Influenza,inj,Quad PF,6+ Mos 05/12/2015, 04/29/2016, 05/31/2017, 04/21/2018, 05/04/2019  . Influenza-Unspecified 07/28/2015, 05/04/2019, 05/20/2020  . Moderna Sars-Covid-2 Vaccination 03/29/2020, 04/26/2020  . PFIZER(Purple Top)SARS-COV-2 Vaccination 11/09/2019, 11/30/2019  . Td 01/23/2016  . Tdap 01/05/2012, 10/08/2017  . Zoster Recombinat (Shingrix) 05/04/2019, 07/13/2019    Conditions to be addressed/monitored:    Care Plan : [place]  Updates made by Vladimir Faster, [place] since 01/14/2021 12:00 AM    Problem: BPH, HTN, GERD, HLD   Priority: High    Long-Range Goal: Disease Management   Start Date: 10/23/2020  Recent Progress: On track  Priority: High  Note:   Current Barriers:  . Unable to independently afford treatment regimen . Unable to independently monitor therapeutic efficacy  Pharmacist Clinical Goal(s):  Marland Kitchen Over the next 90 days, patient will verbalize ability to afford treatment regimen . achieve adherence to monitoring guidelines and medication adherence to achieve therapeutic efficacy through  collaboration with PharmD and provider.  .   Interventions: . 1:1 collaboration with Valerie Roys, DO regarding development and update of comprehensive plan of care as evidenced by provider attestation and co-signature . Inter-disciplinary care team collaboration (see longitudinal plan of care) . Comprehensive medication review performed; medication list updated in electronic medical record .  BP Readings from Last 3 Encounters:  10/07/20 121/83  10/03/20 (!) 156/91  09/16/20 134/85    Hypertension (BP goal <130/80) -Controlled -Current treatment: . Lisinopril 20 mg qd -Medications previously tried: amlodipine  -Current home readings: 130s-140s/80-90s Update6/1/22 Not checking per his report -Denies hypotensive/hypertensive symptoms -Educated on BP goals and benefits of medications for prevention of heart attack, stroke and kidney damage; Daily salt intake goal < 2300 mg; Exercise goal of 150 minutes per week; Importance of home blood  pressure monitoring; -Counseled to monitor BP at home 2-3 times weekly, document, and provide log at future appointments -Counseled on diet and exercise extensively Recommended to continue current medication Reccommended checking BP 3 times weekly at home.  Lab Results  Component Value Date   LDLCALC 101 (H) 10/07/2020   The 10-year ASCVD risk score Mikey Bussing DC Brooke Bonito., et al., 2013) is: 11.8%   Values used to calculate the score:     Age: 67 years     Sex: Male     Is Non-Hispanic African American: No     Diabetic: No     Tobacco smoker: No     Systolic Blood Pressure: 132 mmHg     Is BP treated: Yes     HDL Cholesterol: 37 mg/dL     Total Cholesterol: 160 mg/dL  Hyperlipidemia/ ? Fatty liver: (LDL goal < 100) -Controlled -Current treatment: . Aspirin 81 mg ec qd -Medications previously tried: Simvastatin per cardiology note from 2015 unsure why this was discontinued  -Educated on Cholesterol goals;  Benefits of statin for ASCVD risk  reduction; Importance of limiting foods high in cholesterol; -Recommended statin therapy for fatty liver/HLD or adding reason to exclude therapy  BPH/Urinary retention (Goal: minimize symptoms prevent complications) -Not ideally controlled -Current treatment  . Tadalafil 5 mg qd prn  -Medications previously tried: tamsulosin finasteride s/p TURP -Recommended to continue current medication Assessed patient finances. Cialis patient assistance forms submitted and waiting for approval 01/14/21 Patient approved receiving from PAP Patient self caths tid and is considering placement of an interstem device 01/14/21: Interstim palcement cancelled 2/2/ infection. Not yet rescheduled. Patient want to change suppliers of his catheters. Has 2 numbers to call today. Advised him to return call to Dr. Cherrie Gauze office.  Constipation (Goal:relief of symptoms) -Controlled -Current treatment  . Amitiza 8 mcg bid -Medications previously tried: NA  -Counseled on diet and exercise extensively Assessed patient finances. Patient assistance application in process 11/18/99 patient approved and receiving.  GERD (Goal: reduction in symptom) -Controlled -Current treatment  . Famotidine 20 mg bid . Omeprazole 40 mg qd -Medications previously tried: NA  -Counseled on diet and exercise extensively Counseled on nonpharm interventions  Recommended trial off one agent, magnesium level   Patient Goals/Self-Care Activities . Over the next 90  days, patient will:  - take medications as prescribed  Follow Up Plan: Telephone follow up appointment with care management team member scheduled for: 3 months      Medication Assistance: Application for Amitiza and Cialis  medication assistance program. in process.  Anticipated assistance start date unknown.  See plan of care for additional detail.  Patient's preferred pharmacy is:  [facility] ([facility]) [place], [address] [place] [address] Phone: [phone] Fax: [phone]  [place] [address] [place] [address] Phone: [phone] Fax: [phone]  [place] [address] [place] [address] [place] [address] Phone: [phone] Fax: [place], [place] [address] Phone: [phone] Fax: [phone]  Uses pill box? Yes Pt endorses 90% compliance  We discussed: Benefits of medication synchronization, packaging and delivery as well as enhanced pharmacist oversight with [employer]. Patient decided to: Continue current medication management strategy  Care Plan and Follow Up  Patient Decision:  Patient agrees to Care Plan and Follow-up.  Plan: Telephone follow up appointment with care management team member scheduled for:  3 months PharmD, I month CPA  Junita Push. Kenton Kingfisher PharmD, [facility] [phone]

## 2021-01-15 NOTE — Progress Notes (Signed)
01/16/2021 2:05 PM   Derrick Sandoval. 01-21-1956 [id number]  Referring provider: Valerie Roys, DO [place],  [place] [address]  Chief Complaint  Patient presents with  . Orchitis and epididymitis   Urological history: 1. Incomplete bladder emptying -He has a personal history of a TURP and urethral stricture. He is now being followed by [facility] urology where he is undergone urodynamics.He is scheduled to have an InterStim device placed by Dr. Westly Pam on 12/23/2020 and 01/06/2021 -currently cathing three times daily   2. Orchitis and epididymitis -had episode in 2021  HPI: Derrick Sandoval. is a 65 y.o. male who presents today for left epididymo-orchitis recheck.    He states he has had significant improvement with scrotal pain and scrotal swelling.  He finishes doxycycline over the weekend.  Patient denies any modifying or aggravating factors.  Patient denies any gross hematuria, dysuria or suprapubic/flank pain.  Patient denies any fevers, chills, nausea or vomiting.   UA 11-30 WBC's and moderate bacteria.    PMH: Past Medical History:  Diagnosis Date  . Allergy   . Arthritis   . Cervical radiculopathy 01/16/2021  . Cough    nonproductive no fever saw at dr 05-26-18  . GERD (gastroesophageal reflux disease)   . Hyperlipidemia   . Hypertension   . Meningitis due to viruses 1968    Surgical History: Past Surgical History:  Procedure Laterality Date  . COLONOSCOPY WITH PROPOFOL N/A 05/12/2018   Procedure: COLONOSCOPY WITH PROPOFOL;  Surgeon: Jonathon Bellows, MD;  Location[facility] ENDOSCOPY;  Service: Gastroenterology;  Laterality: N/A;  . ESOPHAGOGASTRODUODENOSCOPY (EGD) WITH PROPOFOL N/A 05/12/2018   Procedure: ESOPHAGOGASTRODUODENOSCOPY (EGD) WITH PROPOFOL;  Surgeon: Jonathon Bellows, MD;  Location: [facility] ENDOSCOPY;  Service: Gastroenterology;  Laterality: N/A;  . ESOPHAGOGASTRODUODENOSCOPY (EGD) WITH PROPOFOL N/A 06/01/2018   Procedure: ESOPHAGOGASTRODUODENOSCOPY  (EGD) WITH PROPOFOL;  Surgeon: Milus Banister, MD;  Location: [facility] ENDOSCOPY;  Service: Endoscopy;  Laterality: N/A;  . EUS N/A 06/01/2018   Procedure: UPPER ENDOSCOPIC ULTRASOUND (EUS) RADIAL;  Surgeon: Milus Banister, MD;  Location: [facility] ENDOSCOPY;  Service: Endoscopy;  Laterality: N/A;  . JOINT REPLACEMENT Bilateral   . NASAL SINUS SURGERY    . TOTAL KNEE ARTHROPLASTY      Home Medications:  Allergies as of 01/16/2021   No Known Allergies     Medication List       Accurate as of January 16, 2021 11:59 PM. If you have any questions, ask your nurse or doctor.        STOP taking these medications   naproxen 500 MG tablet Commonly known as: Naprosyn Stopped by: Zara Council, PA-C     TAKE these medications   acyclovir 400 MG tablet Commonly known as: ZOVIRAX Take 1 tablet (400 mg total) by mouth 2 (two) times daily.   albuterol 108 (90 Base) MCG/ACT inhaler Commonly known as: VENTOLIN HFA Inhale 1-2 puffs into the lungs every 6 (six) hours as needed for wheezing or shortness of breath.   aspirin EC 81 MG tablet Take 81 mg by mouth daily.   cholecalciferol 1000 units tablet Commonly known as: VITAMIN D Take 1,000 Units by mouth daily.   cyclobenzaprine 10 MG tablet Commonly known as: FLEXERIL Take 1 tablet (10 mg total) by mouth 3 (three) times daily as needed for muscle spasms.   doxycycline 100 MG capsule Commonly known as: VIBRAMYCIN Take 1 capsule (100 mg total) by mouth every 12 (twelve) hours.   famotidine  20 MG tablet Commonly known as: PEPCID Take 1 tablet (20 mg total) by mouth 2 (two) times daily.   fluticasone 50 MCG/ACT nasal spray Commonly known as: FLONASE Place 1 spray into both nostrils 2 (two) times daily.   gabapentin 100 MG capsule Commonly known as: NEURONTIN Take 1 capsule (100 mg total) by mouth at bedtime. Started by: Park Liter, DO   guaiFENesin 600 MG 12 hr tablet Commonly known as: Mucinex Take 1 tablet (600 mg total) by mouth 2  (two) times daily as needed for cough or to loosen phlegm.   lisinopril 20 MG tablet Commonly known as: ZESTRIL Take 1 tablet (20 mg total) by mouth daily.   loratadine 10 MG tablet Commonly known as: CLARITIN Take 1 tablet (10 mg total) by mouth daily.   lubiprostone 8 MCG capsule Commonly known as: Amitiza Take 1 capsule (8 mcg total) by mouth 2 (two) times daily with a meal.   montelukast 10 MG tablet Commonly known as: SINGULAIR Take 1 tablet (10 mg total) by mouth at bedtime.   multivitamin with minerals tablet Take 1 tablet by mouth daily.   naproxen sodium 220 MG tablet Commonly known as: ALEVE Take 220 mg by mouth daily as needed.   nystatin cream Commonly known as: MYCOSTATIN Apply 1 application topically 2 (two) times daily.   omeprazole 40 MG capsule Commonly known as: PRILOSEC Take 1 capsule by mouth once daily   tadalafil 5 MG tablet Commonly known as: CIALIS TAKE 1 TABLET BY MOUTH ONCE DAILY AS NEEDED FOR BPH   triamcinolone ointment 0.1 % Commonly known as: KENALOG Apply 1 application topically 2 (two) times daily.   vitamin C 1000 MG tablet Take 1,000 mg by mouth at bedtime.       Allergies: No Known Allergies  Family History: Family History  Problem Relation Age of Onset  . Breast cancer Mother   . Cancer Father        unsure, passed away before it was confirmed  . Pancreatic cancer Daughter   . Diabetes Neg Hx   . Heart disease Neg Hx   . Hypertension Neg Hx   . Stroke Neg Hx   . COPD Neg Hx   . Colon cancer Neg Hx     Social History:  reports that he has never smoked. He has never used smokeless tobacco. He reports previous alcohol use. He reports that he does not use drugs.  ROS: Pertinent ROS in HPI  Physical Exam: BP (!) 164/99   Pulse 63   Ht 5\' 9"  (1.753 m)   Wt 260 lb (117.9 kg)   BMI 38.40 kg/m   Constitutional:  Well nourished. Alert and oriented, No acute distress. HEENT: [place] AT, mask in place.  Trachea  midline Cardiovascular: No clubbing, cyanosis, or edema. Respiratory: Normal respiratory effort, no increased work of breathing. GU: No CVA tenderness.  No bladder fullness or masses.  Patient with buried phallus.  Foreskin easily retracted  Urethral meatus is patent.  No penile discharge. No penile lesions or rashes. Scrotum without lesions, cysts, rashes and/or edema.  Testicles are located scrotally bilaterally. Left testicle still enlarged and slightly tender.  No masses are appreciated in the testicles. Left and right epididymis are normal. Neurologic: Grossly intact, no focal deficits, moving all 4 extremities. Psychiatric: Normal mood and affect.  Laboratory Data: Lab Results  Component Value Date   WBC 7.6 10/07/2020   HGB 16.9 10/07/2020   HCT 49.0 10/07/2020   MCV 88  10/07/2020   PLT 178 10/07/2020    Lab Results  Component Value Date   CREATININE 1.04 10/07/2020    Lab Results  Component Value Date   TSH 3.400 10/07/2020       Component Value Date/Time   CHOL 160 10/07/2020 1515   CHOL 135 08/25/2016 0827   CHOL 93 01/17/2012 0644   HDL 37 (L) 10/07/2020 1515   HDL 32 (L) 01/17/2012 0644   VLDL 16 08/25/2016 0827   VLDL 17 01/17/2012 0644   LDLCALC 101 (H) 10/07/2020 1515   LDLCALC 44 01/17/2012 0644    Lab Results  Component Value Date   AST 68 (H) 10/07/2020   Lab Results  Component Value Date   ALT 83 (H) 10/07/2020    Urinalysis Component     Latest Ref Rng & Units 01/16/2021  Specific Gravity, UA     1.005 - 1.030 1.020  pH, UA     5.0 - 7.5 5.0  Color, UA     Yellow Yellow  Appearance Ur     Clear Cloudy (A)  Leukocytes,UA     Negative Trace (A)  Protein,UA     Negative/Trace Negative  Glucose, UA     Negative Negative  Ketones, UA     Negative Negative  RBC, UA     Negative Trace (A)  Bilirubin, UA     Negative Negative  Urobilinogen, Ur     0.2 - 1.0 mg/dL 0.2  Nitrite, UA     Negative Negative  Microscopic Examination       See below:   Component     Latest Ref Rng & Units 01/16/2021          WBC, UA     0 - 5 /hpf 11-30 (A)  RBC     0 - 2 /hpf 0-2  Epithelial Cells (non renal)     0 - 10 /hpf 0-10  Bacteria, UA     None seen/Few Moderate (A)  I have reviewed the labs.   Pertinent Imaging: No imaging since last visit with [place]  Assessment & Plan:    1. Left epididymo-orchitis -UA remains grossly infected -restart doxycycline as patient self caths and is more susceptible to infection -urine sent for culture  Return in about 1 month (around 02/15/2021) for recheck .  These notes generated with voice recognition software. I apologize for typographical errors.  Zara Council, PA-C  [facility] [address]  [place], [place] [address] [phone]

## 2021-01-16 ENCOUNTER — Encounter: Payer: Self-pay | Admitting: Family Medicine

## 2021-01-16 ENCOUNTER — Ambulatory Visit (INDEPENDENT_AMBULATORY_CARE_PROVIDER_SITE_OTHER): Payer: PPO | Admitting: Family Medicine

## 2021-01-16 ENCOUNTER — Ambulatory Visit (INDEPENDENT_AMBULATORY_CARE_PROVIDER_SITE_OTHER): Payer: PPO | Admitting: Urology

## 2021-01-16 ENCOUNTER — Encounter: Payer: Self-pay | Admitting: Urology

## 2021-01-16 ENCOUNTER — Other Ambulatory Visit: Payer: Self-pay

## 2021-01-16 VITALS — BP 132/78 | HR 73 | Temp 97.4°F | Wt 274.6 lb

## 2021-01-16 VITALS — BP 164/99 | HR 63 | Ht 69.0 in | Wt 260.0 lb

## 2021-01-16 DIAGNOSIS — M5412 Radiculopathy, cervical region: Secondary | ICD-10-CM | POA: Diagnosis not present

## 2021-01-16 DIAGNOSIS — M79601 Pain in right arm: Secondary | ICD-10-CM

## 2021-01-16 DIAGNOSIS — N453 Epididymo-orchitis: Secondary | ICD-10-CM | POA: Diagnosis not present

## 2021-01-16 HISTORY — DX: Radiculopathy, cervical region: M54.12

## 2021-01-16 LAB — URINALYSIS, COMPLETE
Bilirubin, UA: NEGATIVE
Glucose, UA: NEGATIVE
Ketones, UA: NEGATIVE
Nitrite, UA: NEGATIVE
Protein,UA: NEGATIVE
Specific Gravity, UA: 1.02 (ref 1.005–1.030)
Urobilinogen, Ur: 0.2 mg/dL (ref 0.2–1.0)
pH, UA: 5 (ref 5.0–7.5)

## 2021-01-16 LAB — MICROSCOPIC EXAMINATION

## 2021-01-16 MED ORDER — GABAPENTIN 100 MG PO CAPS: 100.0000 mg | ORAL_CAPSULE | Freq: Every day | ORAL | 3 refills | Status: DC

## 2021-01-16 MED ORDER — DOXYCYCLINE HYCLATE 100 MG PO CAPS: 100.0000 mg | ORAL_CAPSULE | Freq: Two times a day (BID) | ORAL | 0 refills | Status: DC

## 2021-01-16 NOTE — Patient Instructions (Signed)

## 2021-01-16 NOTE — Progress Notes (Signed)
BP 132/78   Pulse 73   Temp (!) 97.4 F (36.3 C) (Oral)   Wt 274 lb 9.6 oz (124.6 kg)   SpO2 98%   BMI 40.55 kg/m    Subjective:    Patient ID: Derrick Sandoval., male    DOB: 1955-11-15, 65 y.o.   MRN: [id number]  HPI: Derrick Sandoval. is a 65 y.o. male  Chief Complaint  Patient presents with  . Pain and numbness    Right forearm x 3 weeks, keeps him up at night   ARM PAIN Duration: 3 weeks Location: R arm from elbow into his forearm Mechanism of injury: unknown Onset: sudden Severity: severe  Quality:  Numb, tingling Frequency: constant, worse at night Radiation: no Aggravating factors: working on the computer   Alleviating factors: laying on it   Status: worse Treatments attempted:laying on it   Relief with NSAIDs?:  mild Swelling: no Redness: no  Warmth: no Trauma: no Chest pain: no  Shortness of breath: no  Fever: no Decreased sensation: yes Paresthesias: yes Weakness: no  Relevant past medical, surgical, family and social history reviewed and updated as indicated. Interim medical history since our last visit reviewed. Allergies and medications reviewed and updated.  Review of Systems  Constitutional: Negative.   Respiratory: Negative.   Cardiovascular: Negative.   Gastrointestinal: Negative.   Musculoskeletal: Positive for arthralgias and myalgias. Negative for back pain, gait problem, joint swelling, neck pain and neck stiffness.  Skin: Negative.   Neurological: Positive for weakness and numbness. Negative for dizziness, tremors, seizures, syncope, facial asymmetry, speech difficulty, light-headedness and headaches.  Psychiatric/Behavioral: Negative.     Per HPI unless specifically indicated above     Objective:    BP 132/78   Pulse 73   Temp (!) 97.4 F (36.3 C) (Oral)   Wt 274 lb 9.6 oz (124.6 kg)   SpO2 98%   BMI 40.55 kg/m   Wt Readings from Last 3 Encounters:  01/16/21 274 lb 9.6 oz (124.6 kg)  01/16/21 260 lb (117.9 kg)   01/02/21 277 lb (125.6 kg)    Physical Exam Vitals and nursing note reviewed.  Constitutional:      General: He is not in acute distress.    Appearance: Normal appearance. He is not ill-appearing, toxic-appearing or diaphoretic.  HENT:     Head: Normocephalic and atraumatic.     Right Ear: External ear normal.     Left Ear: External ear normal.     Nose: Nose normal.     Mouth/Throat:     Mouth: Mucous membranes are moist.     Pharynx: Oropharynx is clear.  Eyes:     General: No scleral icterus.       Right eye: No discharge.        Left eye: No discharge.     Extraocular Movements: Extraocular movements intact.     Conjunctiva/sclera: Conjunctivae normal.     Pupils: Pupils are equal, round, and reactive to light.  Cardiovascular:     Rate and Rhythm: Normal rate and regular rhythm.     Pulses: Normal pulses.     Heart sounds: Normal heart sounds. No murmur heard. No friction rub. No gallop.   Pulmonary:     Effort: Pulmonary effort is normal. No respiratory distress.     Breath sounds: Normal breath sounds. No stridor. No wheezing, rhonchi or rales.  Chest:     Chest wall: No tenderness.  Musculoskeletal:  General: Normal range of motion.     Cervical back: Normal range of motion and neck supple.  Skin:    General: Skin is warm and dry.     Capillary Refill: Capillary refill takes less than 2 seconds.     Coloration: Skin is not jaundiced or pale.     Findings: No bruising, erythema, lesion or rash.  Neurological:     General: No focal deficit present.     Mental Status: He is alert and oriented to person, place, and time. Mental status is at baseline.  Psychiatric:        Mood and Affect: Mood normal.        Behavior: Behavior normal.        Thought Content: Thought content normal.        Judgment: Judgment normal.     Results for orders placed or performed in visit on 01/02/21  CULTURE, URINE COMPREHENSIVE   Specimen: Urine   UR  Result Value Ref  Range   Urine Culture, Comprehensive Final report (A)    Organism ID, Bacteria Escherichia coli (A)    ANTIMICROBIAL SUSCEPTIBILITY Comment   Microscopic Examination   Urine  Result Value Ref Range   WBC, UA >30 (A) 0 - 5 /hpf   RBC 0-2 0 - 2 /hpf   Epithelial Cells (non renal) 0-10 0 - 10 /hpf   Bacteria, UA Many (A) None seen/Few  Urinalysis, Complete  Result Value Ref Range   Specific Gravity, UA 1.015 1.005 - 1.030   pH, UA 5.5 5.0 - 7.5   Color, UA Yellow Yellow   Appearance Ur Cloudy (A) Clear   Leukocytes,UA 2+ (A) Negative   Protein,UA Negative Negative/Trace   Glucose, UA Negative Negative   Ketones, UA Negative Negative   RBC, UA Trace (A) Negative   Bilirubin, UA Negative Negative   Urobilinogen, Ur 0.2 0.2 - 1.0 mg/dL   Nitrite, UA Positive (A) Negative   Microscopic Examination See below:       Assessment & Plan:   Problem List Items Addressed This Visit      Nervous and Auditory   Cervical radiculopathy - Primary   Relevant Medications   gabapentin (NEURONTIN) 100 MG capsule   Other Relevant Orders   Ambulatory referral to Orthopedic Surgery    Other Visit Diagnoses    Right arm pain       Likely due to his cervical radiculopathy. Call with any concerns. Conitnue to monitor.   Relevant Orders   Ambulatory referral to Orthopedic Surgery       Follow up plan: Return in about 4 weeks (around 02/13/2021).

## 2021-01-16 NOTE — Assessment & Plan Note (Signed)
Known. Worsening now with R arm pain. Offered PT. He would like to hold off. WIll get him into ortho and start gabapentin. Call with any concerns.

## 2021-01-19 ENCOUNTER — Other Ambulatory Visit: Payer: Self-pay | Admitting: *Deleted

## 2021-01-19 LAB — CULTURE, URINE COMPREHENSIVE

## 2021-01-19 MED ORDER — CIPROFLOXACIN HCL 500 MG PO TABS: 500.0000 mg | ORAL_TABLET | Freq: Two times a day (BID) | ORAL | 0 refills | Status: AC

## 2021-01-23 ENCOUNTER — Encounter: Payer: Self-pay | Admitting: Family Medicine

## 2021-02-11 ENCOUNTER — Other Ambulatory Visit: Payer: Self-pay | Admitting: Family Medicine

## 2021-02-11 NOTE — Telephone Encounter (Signed)
Pt had apt on 01/16/2021 next on 02/17/21

## 2021-02-11 NOTE — Telephone Encounter (Signed)
Requested medication (s) are due for refill today: yes  Requested medication (s) are on the active medication list: yes   Last refill: 10/07/20 #30  0 refills  Future visit scheduled yes 02/17/21  Notes to clinic: not delegated  Requested Prescriptions  Pending Prescriptions Disp Refills   cyclobenzaprine (FLEXERIL) 10 MG tablet [Pharmacy Med Name: Cyclobenzaprine HCl 10 MG Oral Tablet] 30 tablet 0    Sig: Take 1 tablet by mouth three times daily as needed for muscle spasm      Not Delegated - Analgesics:  Muscle Relaxants Failed - 02/11/2021 10:22 AM      Failed - This refill cannot be delegated      Passed - Valid encounter within last 6 months    Recent Outpatient Visits           3 weeks ago Cervical radiculopathy   Nambe, [place], DO   1 month ago Left testicular pain   [place], [place], DO   2 months ago Penile discharge   Heard, [place] P, DO   4 months ago Routine general medical examination at a health care facility   [facility], [place] P, DO   4 months ago Cervical spondylosis   Be[place], NP       Future Appointments             In 6 days McGowan, Gordan Payment [place]   In 6 days Wynetta Emery, Barb Merino, DO [employer], [place]   In 1 month [place], Barb Merino, DO [employer], [place]   In 4 months  [employer], [place]   In 7 months McGowan, Shannon A, [place]

## 2021-02-17 ENCOUNTER — Encounter: Payer: Self-pay | Admitting: Family Medicine

## 2021-02-17 ENCOUNTER — Encounter: Payer: Self-pay | Admitting: Urology

## 2021-02-17 ENCOUNTER — Ambulatory Visit
Admission: RE | Admit: 2021-02-17 | Discharge: 2021-02-17 | Disposition: A | Discharge status: Home / Self Care | Payer: PPO | Source: Ambulatory Visit | Attending: Family Medicine | Admitting: Family Medicine

## 2021-02-17 ENCOUNTER — Ambulatory Visit (INDEPENDENT_AMBULATORY_CARE_PROVIDER_SITE_OTHER): Payer: PPO | Admitting: Family Medicine

## 2021-02-17 ENCOUNTER — Other Ambulatory Visit: Payer: Self-pay

## 2021-02-17 ENCOUNTER — Ambulatory Visit
Admission: RE | Admit: 2021-02-17 | Discharge: 2021-02-17 | Disposition: A | Discharge status: Home / Self Care | Payer: PPO | Attending: Family Medicine | Admitting: Family Medicine

## 2021-02-17 ENCOUNTER — Ambulatory Visit (INDEPENDENT_AMBULATORY_CARE_PROVIDER_SITE_OTHER): Payer: PPO | Admitting: Urology

## 2021-02-17 VITALS — BP 142/98 | HR 73 | Ht 68.0 in | Wt 260.0 lb

## 2021-02-17 VITALS — BP 126/85 | HR 72 | Temp 97.7°F | Wt 270.2 lb

## 2021-02-17 DIAGNOSIS — N453 Epididymo-orchitis: Secondary | ICD-10-CM | POA: Diagnosis not present

## 2021-02-17 DIAGNOSIS — R059 Cough, unspecified: Secondary | ICD-10-CM

## 2021-02-17 DIAGNOSIS — M5412 Radiculopathy, cervical region: Secondary | ICD-10-CM

## 2021-02-17 NOTE — Progress Notes (Signed)
02/17/2021 8:31 AM   Derrick Sandoval. [birth year] [id number]  Referring provider: Valerie Roys, DO [place],  [place] [address]  Chief Complaint  Patient presents with   Other   Urological history: 1. Incomplete bladder emptying -He has a personal history of a TURP and urethral stricture.  He is now being followed by [facility] urology where he is undergone urodynamics. He is scheduled to have an InterStim device placed by Dr. Westly Pam on 12/23/2020 and 01/06/2021 -currently cathing three times daily   2. Orchitis and epididymitis -had episode in 2021  HPI: Derrick Sandoval. is a 65 y.o. male who presents today for left epididymo-orchitis recheck.  He states that he is 100% back to baseline.  He no longer has scrotal swelling or scrotal pain.  He continues to have baseline symptoms of frequent urination, incontinence, difficulty urinating and weak urinary stream.  He is also self cathing 3 times daily.  Patient denies any modifying or aggravating factors.  Patient denies any gross hematuria, dysuria or suprapubic/flank pain.  Patient denies any fevers, chills, nausea or vomiting.    UA > 30 WBC's and moderate bacteria.    PMH: Past Medical History:  Diagnosis Date   Allergy    Arthritis    Cervical radiculopathy 01/16/2021   Cough    nonproductive no fever saw at dr 05-26-18   GERD (gastroesophageal reflux disease)    Hyperlipidemia    Hypertension    Meningitis due to viruses 1968    Surgical History: Past Surgical History:  Procedure Laterality Date   COLONOSCOPY WITH PROPOFOL N/A 05/12/2018   Procedure: COLONOSCOPY WITH PROPOFOL;  Surgeon: Jonathon Bellows, MD;  Location: [facility] ENDOSCOPY;  Service: Gastroenterology;  Laterality: N/A;   ESOPHAGOGASTRODUODENOSCOPY (EGD) WITH PROPOFOL N/A 05/12/2018   Procedure: ESOPHAGOGASTRODUODENOSCOPY (EGD) WITH PROPOFOL;  Surgeon: Jonathon Bellows, MD;  Location: [facility] ENDOSCOPY;  Service: Gastroenterology;  Laterality: N/A;    ESOPHAGOGASTRODUODENOSCOPY (EGD) WITH PROPOFOL N/A 06/01/2018   Procedure: ESOPHAGOGASTRODUODENOSCOPY (EGD) WITH PROPOFOL;  Surgeon: Milus Banister, MD;  Location: [facility] ENDOSCOPY;  Service: Endoscopy;  Laterality: N/A;   EUS N/A 06/01/2018   Procedure: UPPER ENDOSCOPIC ULTRASOUND (EUS) RADIAL;  Surgeon: Milus Banister, MD;  Location: [facility] ENDOSCOPY;  Service: Endoscopy;  Laterality: N/A;   JOINT REPLACEMENT Bilateral    NASAL SINUS SURGERY     TOTAL KNEE ARTHROPLASTY      Home Medications:  Allergies as of 02/17/2021   No Known Allergies      Medication List        Accurate as of February 17, 2021  8:31 AM. If you have any questions, ask your nurse or doctor.          acyclovir 400 MG tablet Commonly known as: ZOVIRAX Take 1 tablet (400 mg total) by mouth 2 (two) times daily.   albuterol 108 (90 Base) MCG/ACT inhaler Commonly known as: VENTOLIN HFA Inhale 1-2 puffs into the lungs every 6 (six) hours as needed for wheezing or shortness of breath.   aspirin EC 81 MG tablet Take 81 mg by mouth daily.   cholecalciferol 1000 units tablet Commonly known as: VITAMIN D Take 1,000 Units by mouth daily.   cyclobenzaprine 10 MG tablet Commonly known as: FLEXERIL Take 1 tablet by mouth three times daily as needed for muscle spasm   famotidine 20 MG tablet Commonly known as: PEPCID Take 1 tablet (20 mg total) by mouth 2 (two) times daily.   fluticasone 50 MCG/ACT  nasal spray Commonly known as: FLONASE Place 1 spray into both nostrils 2 (two) times daily.   gabapentin 100 MG capsule Commonly known as: NEURONTIN Take 1 capsule (100 mg total) by mouth at bedtime.   guaiFENesin 600 MG 12 hr tablet Commonly known as: Mucinex Take 1 tablet (600 mg total) by mouth 2 (two) times daily as needed for cough or to loosen phlegm.   lisinopril 20 MG tablet Commonly known as: ZESTRIL Take 1 tablet (20 mg total) by mouth daily.   loratadine 10 MG tablet Commonly known as: CLARITIN Take  1 tablet (10 mg total) by mouth daily.   lubiprostone 8 MCG capsule Commonly known as: Amitiza Take 1 capsule (8 mcg total) by mouth 2 (two) times daily with a meal.   montelukast 10 MG tablet Commonly known as: SINGULAIR Take 1 tablet (10 mg total) by mouth at bedtime.   multivitamin with minerals tablet Take 1 tablet by mouth daily.   naproxen sodium 220 MG tablet Commonly known as: ALEVE Take 220 mg by mouth daily as needed.   nystatin cream Commonly known as: MYCOSTATIN Apply 1 application topically 2 (two) times daily.   omeprazole 40 MG capsule Commonly known as: PRILOSEC Take 1 capsule by mouth once daily   tadalafil 5 MG tablet Commonly known as: CIALIS TAKE 1 TABLET BY MOUTH ONCE DAILY AS NEEDED FOR BPH   triamcinolone ointment 0.1 % Commonly known as: KENALOG Apply 1 application topically 2 (two) times daily.   vitamin C 1000 MG tablet Take 1,000 mg by mouth at bedtime.        Allergies: No Known Allergies  Family History: Family History  Problem Relation Age of Onset   Breast cancer Mother    Cancer Father        unsure, passed away before it was confirmed   Pancreatic cancer Daughter    Diabetes Neg Hx    Heart disease Neg Hx    Hypertension Neg Hx    Stroke Neg Hx    COPD Neg Hx    Colon cancer Neg Hx     Social History:  reports that he has never smoked. He has never used smokeless tobacco. He reports previous alcohol use. He reports that he does not use drugs.  ROS: Pertinent ROS in HPI  Physical Exam: BP (!) 142/98   Pulse 73   Ht 5\' 8"  (1.727 m)   Wt 260 lb (117.9 kg)   BMI 39.53 kg/m   Constitutional:  Well nourished. Alert and oriented, No acute distress. HEENT: [place] AT, moist mucus membranes.  Trachea midline Cardiovascular: No clubbing, cyanosis, or edema. Respiratory: Normal respiratory effort, no increased work of breathing. GU: No CVA tenderness.  No bladder fullness or masses.  Patient with buried phallus.  Scrotum  without lesions, cysts, rashes and/or edema.  Testicles are located scrotally bilaterally. No masses are appreciated in the testicles. Left and right epididymis are normal. Neurologic: Grossly intact, no focal deficits, moving all 4 extremities. Psychiatric: Normal mood and affect.   Laboratory Data: Urinalysis Component     Latest Ref Rng & Units 02/17/2021  Specific Gravity, UA     1.005 - 1.030 1.015  pH, UA     5.0 - 7.5 5.0  Color, UA     Yellow Yellow  Appearance Ur     Clear Cloudy (A)  Leukocytes,UA     Negative 2+ (A)  Protein,UA     Negative/Trace Negative  Glucose, UA  Negative Negative  Ketones, UA     Negative Negative  RBC, UA     Negative Trace (A)  Bilirubin, UA     Negative Negative  Urobilinogen, Ur     0.2 - 1.0 mg/dL 0.2  Nitrite, UA     Negative Negative  Microscopic Examination      See below:   Component     Latest Ref Rng & Units 02/17/2021          WBC, UA     0 - 5 /hpf >30 (H)  RBC     0 - 2 /hpf 0-2  Epithelial Cells (non renal)     0 - 10 /hpf 0-10  Bacteria, UA     None seen/Few Many (A)   I have reviewed the labs.   Pertinent Imaging: No imaging since last visit with [place]  Assessment & Plan:    1. Left epididymo-orchitis -resolved  2.  Asymptomatic bacteriuria -Patient likely colonized due to self cathing as he is asymptomatic at this visit -I will not send for culture and I will not prescribe an antibiotic at this time  3. Urinary retention -Instructed to reach out to [facility] urology to have his InterStim rescheduled  No follow-ups on file.  These notes generated with voice recognition software. I apologize for typographical errors.  Zara Council, PA-C  [facility] [address]  [place], [place] [address] [phone]

## 2021-02-17 NOTE — Progress Notes (Signed)
BP 126/85   Pulse 72   Temp 97.7 F (36.5 C)   Wt 270 lb 3.2 oz (122.6 kg)   SpO2 97%   BMI 41.08 kg/m    Subjective:    Patient ID: Derrick Ball., male    DOB: August 05, 1956, 65 y.o.   MRN: [id number]  HPI: Derrick Mcaffee. is a 65 y.o. male  Chief Complaint  Patient presents with   Cervical radiculopathy    Cough    Patient states he has been coughing on and off for about 3 weeks, has chest congestion. Took at home covid test last week, was negative and took one yesterday was negative.    NECK PAIN FOLLOW UP- stopped his gabapentin. States that he doesn't want to see the orthopedist.  Diagnosis: cervical radiculopathy Status: better Treatments attempted: rest, ice, heat, APAP, ibuprofen, aleve, and muscle relaxer  Compliant with recommended treatment: average Relief with NSAIDs?:  mild Location:Right arm Duration:chronic Severity: mild Quality: numb and tinglint Frequency: intermittent Radiation: R arm Aggravating factors: lifting and movement Alleviating factors: muscle relaxer Weakness:  yes Paresthesias / decreased sensation:  yes  Fevers:  no  UPPER RESPIRATORY TRACT INFECTION Duration: 2.5 weeks Worst symptom: cough Fever: no Cough: yes Shortness of breath: yes Wheezing: no Chest pain: no Chest tightness: yes Chest congestion: yes Nasal congestion: no Runny nose: no Post nasal drip: yes Sneezing: yes Sore throat: no Swollen glands: no Sinus pressure: no Headache: yes Face pain: no Toothache: no Ear pain: yes "right Ear pressure: no  Eyes red/itching:no Eye drainage/crusting: no  Vomiting: no Rash: no Fatigue: yes Sick contacts: no Strep contacts: no  Context: stable Recurrent sinusitis: yes Relief with OTC cold/cough medications: no  Treatments attempted: cold/sinus, anti-histamine, and pseudoephedrine   Relevant past medical, surgical, family and social history reviewed and updated as indicated. Interim medical history since  our last visit reviewed. Allergies and medications reviewed and updated.  Review of Systems  Constitutional:  Positive for fatigue. Negative for activity change, appetite change, chills, diaphoresis, fever and unexpected weight change.  HENT:  Positive for postnasal drip, sinus pressure and sneezing. Negative for congestion, dental problem, drooling, ear discharge, ear pain, facial swelling, hearing loss, mouth sores, nosebleeds, rhinorrhea, sinus pain, sore throat, tinnitus, trouble swallowing and voice change.   Eyes: Negative.   Respiratory:  Positive for cough. Negative for apnea, choking, chest tightness, shortness of breath, wheezing and stridor.   Cardiovascular: Negative.   Gastrointestinal: Negative.   Musculoskeletal: Negative.   Neurological:  Positive for numbness. Negative for dizziness, tremors, seizures, syncope, facial asymmetry, speech difficulty, weakness, light-headedness and headaches.  Psychiatric/Behavioral: Negative.     Per HPI unless specifically indicated above     Objective:    BP 126/85   Pulse 72   Temp 97.7 F (36.5 C)   Wt 270 lb 3.2 oz (122.6 kg)   SpO2 97%   BMI 41.08 kg/m   Wt Readings from Last 3 Encounters:  02/17/21 270 lb 3.2 oz (122.6 kg)  02/17/21 260 lb (117.9 kg)  01/16/21 274 lb 9.6 oz (124.6 kg)    Physical Exam Vitals and nursing note reviewed.  Constitutional:      General: He is not in acute distress.    Appearance: Normal appearance. He is obese. He is not ill-appearing, toxic-appearing or diaphoretic.  HENT:     Head: Normocephalic and atraumatic.     Right Ear: Tympanic membrane, ear canal and external ear normal.  Left Ear: Tympanic membrane, ear canal and external ear normal.     Nose: Nose normal. No congestion or rhinorrhea.     Mouth/Throat:     Mouth: Mucous membranes are moist.     Pharynx: Oropharynx is clear. No oropharyngeal exudate or posterior oropharyngeal erythema.  Eyes:     General: No scleral icterus.        Right eye: No discharge.        Left eye: No discharge.     Extraocular Movements: Extraocular movements intact.     Conjunctiva/sclera: Conjunctivae normal.     Pupils: Pupils are equal, round, and reactive to light.  Cardiovascular:     Rate and Rhythm: Normal rate and regular rhythm.     Pulses: Normal pulses.     Heart sounds: Normal heart sounds. No murmur heard.   No friction rub. No gallop.  Pulmonary:     Effort: Pulmonary effort is normal. No respiratory distress.     Breath sounds: Normal breath sounds. No stridor. No wheezing, rhonchi or rales.  Chest:     Chest wall: No tenderness.  Musculoskeletal:        General: Normal range of motion.     Cervical back: Normal range of motion and neck supple.  Skin:    General: Skin is warm and dry.     Capillary Refill: Capillary refill takes less than 2 seconds.     Coloration: Skin is not jaundiced or pale.     Findings: No bruising, erythema, lesion or rash.  Neurological:     General: No focal deficit present.     Mental Status: He is alert and oriented to person, place, and time. Mental status is at baseline.  Psychiatric:        Mood and Affect: Mood normal.        Behavior: Behavior normal.        Thought Content: Thought content normal.        Judgment: Judgment normal.    Results for orders placed or performed in visit on 01/16/21  CULTURE, URINE COMPREHENSIVE   Specimen: Urine   UR  Result Value Ref Range   Urine Culture, Comprehensive Final report (A)    Organism ID, Bacteria Enterococcus faecalis (A)    ANTIMICROBIAL SUSCEPTIBILITY Comment   Microscopic Examination   Urine  Result Value Ref Range   WBC, UA 11-30 (A) 0 - 5 /hpf   RBC 0-2 0 - 2 /hpf   Epithelial Cells (non renal) 0-10 0 - 10 /hpf   Bacteria, UA Moderate (A) None seen/Few  Urinalysis, Complete  Result Value Ref Range   Specific Gravity, UA 1.020 1.005 - 1.030   pH, UA 5.0 5.0 - 7.5   Color, UA Yellow Yellow   Appearance Ur Cloudy  (A) Clear   Leukocytes,UA Trace (A) Negative   Protein,UA Negative Negative/Trace   Glucose, UA Negative Negative   Ketones, UA Negative Negative   RBC, UA Trace (A) Negative   Bilirubin, UA Negative Negative   Urobilinogen, Ur 0.2 0.2 - 1.0 mg/dL   Nitrite, UA Negative Negative   Microscopic Examination See below:       Assessment & Plan:   Problem List Items Addressed This Visit       Nervous and Auditory   Cervical radiculopathy    Doing better. Encouraged him to keep the appointment with his [facility] orthopedist. Call with any concerns. Continue to monitor.        Other Visit  Diagnoses     Cough    -  Primary   Will obtain CXR. May need maintenance inhaler and spirometry. Call with any concerns. Arlyce Harman next visit.    Relevant Orders   DG Chest 2 View        Follow up plan: Return middle of august- 6 month follow up.

## 2021-02-17 NOTE — Assessment & Plan Note (Signed)
Doing better. Encouraged him to keep the appointment with his [facility] orthopedist. Call with any concerns. Continue to monitor.

## 2021-02-18 LAB — MICROSCOPIC EXAMINATION: WBC, UA: >30 /hpf — ABNORMAL HIGH (ref 0–5)

## 2021-02-18 LAB — URINALYSIS, COMPLETE
Bilirubin, UA: NEGATIVE
Glucose, UA: NEGATIVE
Ketones, UA: NEGATIVE
Nitrite, UA: NEGATIVE
Protein,UA: NEGATIVE
Specific Gravity, UA: 1.015 (ref 1.005–1.030)
Urobilinogen, Ur: 0.2 mg/dL (ref 0.2–1.0)
pH, UA: 5 (ref 5.0–7.5)

## 2021-02-20 ENCOUNTER — Other Ambulatory Visit: Payer: Self-pay | Admitting: Family Medicine

## 2021-02-20 MED ORDER — BUDESONIDE-FORMOTEROL FUMARATE 160-4.5 MCG/ACT IN AERO: 2.0000 | INHALATION_SPRAY | Freq: Two times a day (BID) | RESPIRATORY_TRACT | 3 refills | Status: DC

## 2021-02-20 MED ORDER — ALBUTEROL SULFATE HFA 108 (90 BASE) MCG/ACT IN AERS: 1.0000 | INHALATION_SPRAY | Freq: Four times a day (QID) | RESPIRATORY_TRACT | 3 refills | Status: DC | PRN

## 2021-02-24 ENCOUNTER — Encounter: Payer: Self-pay | Admitting: Family Medicine

## 2021-02-25 ENCOUNTER — Encounter: Payer: Self-pay | Admitting: Family Medicine

## 2021-02-27 DIAGNOSIS — M5412 Radiculopathy, cervical region: Secondary | ICD-10-CM | POA: Diagnosis not present

## 2021-03-22 NOTE — Addendum Note (Signed)
Encounter addended by: Annie Paras on: 03/22/2021 1:40 PM  Actions taken: Letter saved

## 2021-03-24 ENCOUNTER — Telehealth: Payer: Self-pay

## 2021-03-24 NOTE — Chronic Care Management (AMB) (Signed)
Care Management   Note  03/24/2021 Name: Derrick Sandoval. MRN: [id number] DOB: 1956/01/15  Derrick Sandoval. is a 65 y.o. year old male who is a primary care patient of Dorcas Carrow, DO and is actively engaged with the care management team. I reached out to Derrick Sandoval. by phone today to assist with re-scheduling a follow up visit with the Pharmacist  Follow up plan: Telephone appointment with care management team member scheduled for:04/27/2021  Derrick Sandoval, RMA Care Guide, Embedded Care Coordination [facility]  [place], [place] [address] Direct Dial: [phone] [email or web address]

## 2021-03-24 NOTE — Chronic Care Management (AMB) (Signed)
Care Management   Note  03/24/2021 Name: Derrick Sandoval. MRN: [id number] DOB: 09-29-1955  Derrick Sandoval. is a 65 y.o. year old male who is a primary care patient of Dorcas Carrow, DO and is actively engaged with the care management team. I reached out to [employer]. by phone today to assist with re-scheduling a follow up visit with the Pharmacist  Follow up plan: Unsuccessful telephone outreach attempt made. A HIPAA compliant phone message was left for the patient providing contact information and requesting a return call.  The care management team will reach out to the patient again over the next 7 days.  If patient returns call to provider office, please advise to call Embedded Care Management Care Guide Penne Lash  at [phone]  Penne Lash, RMA Care Guide, Embedded Care Coordination [facility]  [place], [place] [address] Direct Dial: [phone] [email or web address]

## 2021-03-27 ENCOUNTER — Encounter: Payer: Self-pay | Admitting: Family Medicine

## 2021-04-03 ENCOUNTER — Other Ambulatory Visit: Payer: Self-pay | Admitting: Family Medicine

## 2021-04-06 ENCOUNTER — Ambulatory Visit: Payer: PPO | Admitting: Family Medicine

## 2021-04-13 DIAGNOSIS — N401 Enlarged prostate with lower urinary tract symptoms: Secondary | ICD-10-CM | POA: Diagnosis not present

## 2021-04-13 DIAGNOSIS — N35919 Unspecified urethral stricture, male, unspecified site: Secondary | ICD-10-CM | POA: Diagnosis not present

## 2021-04-13 DIAGNOSIS — R339 Retention of urine, unspecified: Secondary | ICD-10-CM | POA: Diagnosis not present

## 2021-04-13 DIAGNOSIS — N319 Neuromuscular dysfunction of bladder, unspecified: Secondary | ICD-10-CM | POA: Diagnosis not present

## 2021-04-14 ENCOUNTER — Ambulatory Visit (INDEPENDENT_AMBULATORY_CARE_PROVIDER_SITE_OTHER): Payer: PPO | Admitting: Family Medicine

## 2021-04-14 ENCOUNTER — Other Ambulatory Visit: Payer: Self-pay

## 2021-04-14 ENCOUNTER — Encounter: Payer: Self-pay | Admitting: Family Medicine

## 2021-04-14 VITALS — BP 142/82 | HR 66 | Temp 97.8°F | Ht 67.99 in | Wt 269.6 lb

## 2021-04-14 DIAGNOSIS — K760 Fatty (change of) liver, not elsewhere classified: Secondary | ICD-10-CM | POA: Diagnosis not present

## 2021-04-14 DIAGNOSIS — I10 Essential (primary) hypertension: Secondary | ICD-10-CM | POA: Diagnosis not present

## 2021-04-14 DIAGNOSIS — E559 Vitamin D deficiency, unspecified: Secondary | ICD-10-CM

## 2021-04-14 DIAGNOSIS — E782 Mixed hyperlipidemia: Secondary | ICD-10-CM

## 2021-04-14 DIAGNOSIS — K219 Gastro-esophageal reflux disease without esophagitis: Secondary | ICD-10-CM | POA: Diagnosis not present

## 2021-04-14 MED ORDER — LUBIPROSTONE 8 MCG PO CAPS: 8.0000 ug | ORAL_CAPSULE | Freq: Two times a day (BID) | ORAL | 1 refills | Status: DC

## 2021-04-14 MED ORDER — ACYCLOVIR 400 MG PO TABS: 400.0000 mg | ORAL_TABLET | Freq: Two times a day (BID) | ORAL | 1 refills | Status: DC

## 2021-04-14 MED ORDER — MONTELUKAST SODIUM 10 MG PO TABS: 10.0000 mg | ORAL_TABLET | Freq: Every day | ORAL | 1 refills | Status: DC

## 2021-04-14 MED ORDER — BUDESONIDE-FORMOTEROL FUMARATE 160-4.5 MCG/ACT IN AERO: 2.0000 | INHALATION_SPRAY | Freq: Two times a day (BID) | RESPIRATORY_TRACT | 3 refills | Status: DC

## 2021-04-14 MED ORDER — LISINOPRIL 20 MG PO TABS: 20.0000 mg | ORAL_TABLET | Freq: Every day | ORAL | 1 refills | Status: DC

## 2021-04-14 MED ORDER — OMEPRAZOLE 40 MG PO CPDR: 40.0000 mg | DELAYED_RELEASE_CAPSULE | Freq: Every day | ORAL | 1 refills | Status: DC

## 2021-04-14 NOTE — Assessment & Plan Note (Signed)
Running slightly high. Will work on [employer]. Continue to monitor. Call with any concerns.

## 2021-04-14 NOTE — Assessment & Plan Note (Signed)
Under good control on current regimen. Continue current regimen. Continue to monitor. Call with any concerns. Refills given. Labs drawn today.   

## 2021-04-14 NOTE — Assessment & Plan Note (Signed)
Rechecking labs today. Await results.  

## 2021-04-14 NOTE — Progress Notes (Signed)
BP (!) 142/82   Pulse 66   Temp 97.8 F (36.6 C) (Oral)   Ht 5' 7.99" (1.727 m)   Wt 269 lb 9.6 oz (122.3 kg)   SpO2 95%   BMI 41.00 kg/m    Subjective:    Patient ID: Derrick Sandoval., male    DOB: 1956-04-06, 65 y.o.   MRN: [id number]  HPI: Tykevious Kenderdine. is a 65 y.o. male  Chief Complaint  Patient presents with   Hypertension   Hyperlipidemia   HYPERTENSION / [place] Satisfied with current treatment? yes Duration of hypertension: chronic BP monitoring frequency: not checking BP medication side effects: no Past BP meds: lisinopril Duration of hyperlipidemia: chronic Cholesterol medication side effects: not on anything Cholesterol supplements: none Past cholesterol medications: none Medication compliance: excellent compliance Aspirin: yes Recent stressors: no Recurrent headaches: no Visual changes: no Palpitations: no Dyspnea: no Chest pain: no Lower extremity edema: no Dizzy/lightheaded: no   Relevant past medical, surgical, family and social history reviewed and updated as indicated. Interim medical history since our last visit reviewed. Allergies and medications reviewed and updated.  Review of Systems  Constitutional: Negative.   Respiratory: Negative.    Cardiovascular: Negative.   Gastrointestinal: Negative.   Musculoskeletal: Negative.   Neurological: Negative.   Psychiatric/Behavioral: Negative.     Per HPI unless specifically indicated above     Objective:    BP (!) 142/82   Pulse 66   Temp 97.8 F (36.6 C) (Oral)   Ht 5' 7.99" (1.727 m)   Wt 269 lb 9.6 oz (122.3 kg)   SpO2 95%   BMI 41.00 kg/m   Wt Readings from Last 3 Encounters:  04/14/21 269 lb 9.6 oz (122.3 kg)  02/17/21 270 lb 3.2 oz (122.6 kg)  02/17/21 260 lb (117.9 kg)    Physical Exam Vitals and nursing note reviewed.  Constitutional:      General: He is not in acute distress.    Appearance: Normal appearance. He is obese. He is not ill-appearing,  toxic-appearing or diaphoretic.  HENT:     Head: Normocephalic and atraumatic.     Right Ear: External ear normal.     Left Ear: External ear normal.     Nose: Nose normal.     Mouth/Throat:     Mouth: Mucous membranes are moist.     Pharynx: Oropharynx is clear.  Eyes:     General: No scleral icterus.       Right eye: No discharge.        Left eye: No discharge.     Extraocular Movements: Extraocular movements intact.     Conjunctiva/sclera: Conjunctivae normal.     Pupils: Pupils are equal, round, and reactive to light.  Cardiovascular:     Rate and Rhythm: Normal rate and regular rhythm.     Pulses: Normal pulses.     Heart sounds: Normal heart sounds. No murmur heard.   No friction rub. No gallop.  Pulmonary:     Effort: Pulmonary effort is normal. No respiratory distress.     Breath sounds: Normal breath sounds. No stridor. No wheezing, rhonchi or rales.  Chest:     Chest wall: No tenderness.  Musculoskeletal:        General: Normal range of motion.     Cervical back: Normal range of motion and neck supple.  Skin:    General: Skin is warm and dry.     Capillary Refill: Capillary refill takes less than  2 seconds.     Coloration: Skin is not jaundiced or pale.     Findings: No bruising, erythema, lesion or rash.  Neurological:     General: No focal deficit present.     Mental Status: He is alert and oriented to person, place, and time. Mental status is at baseline.  Psychiatric:        Mood and Affect: Mood normal.        Behavior: Behavior normal.        Thought Content: Thought content normal.        Judgment: Judgment normal.    Results for orders placed or performed in visit on 02/17/21  Microscopic Examination   Urine  Result Value Ref Range   WBC, UA >30 (H) 0 - 5 /hpf   RBC 0-2 0 - 2 /hpf   Epithelial Cells (non renal) 0-10 0 - 10 /hpf   Bacteria, UA Many (A) None seen/Few  Urinalysis, Complete  Result Value Ref Range   Specific Gravity, UA 1.015 1.005  - 1.030   pH, UA 5.0 5.0 - 7.5   Color, UA Yellow Yellow   Appearance Ur Cloudy (A) Clear   Leukocytes,UA 2+ (A) Negative   Protein,UA Negative Negative/Trace   Glucose, UA Negative Negative   Ketones, UA Negative Negative   RBC, UA Trace (A) Negative   Bilirubin, UA Negative Negative   Urobilinogen, Ur 0.2 0.2 - 1.0 mg/dL   Nitrite, UA Negative Negative   Microscopic Examination See below:       Assessment & Plan:   Problem List Items Addressed This Visit       Cardiovascular and Mediastinum   Benign hypertension - Primary    Running slightly high. Will work on [employer]. Continue to monitor. Call with any concerns.       Relevant Medications   lisinopril (ZESTRIL) 20 MG tablet   Other Relevant Orders   CBC with Differential/Platelet   Comprehensive metabolic panel     Digestive   Fatty liver    Rechecking labs today. Await results.       Relevant Orders   CBC with Differential/Platelet   Comprehensive metabolic panel   GERD (gastroesophageal reflux disease)    Under good control on current regimen. Continue current regimen. Continue to monitor. Call with any concerns. Refills given. Labs drawn today.       Relevant Medications   omeprazole (PRILOSEC) 40 MG capsule   lubiprostone (AMITIZA) 8 MCG capsule   Other Relevant Orders   CBC with Differential/Platelet   Comprehensive metabolic panel     Other   Hyperlipidemia    Under good control on current regimen. Continue current regimen. Continue to monitor. Call with any concerns. Refills given. Labs drawn today.       Relevant Medications   lisinopril (ZESTRIL) 20 MG tablet   Other Relevant Orders   CBC with Differential/Platelet   Comprehensive metabolic panel   Lipid Panel w/o Chol/HDL Ratio   Vitamin D deficiency    Rechecking labs today. Await results.       Relevant Orders   CBC with Differential/Platelet   Comprehensive metabolic panel   VITAMIN D 25 Hydroxy (Vit-D Deficiency, Fractures)      Follow up plan: Return in about 6 months (around 10/13/2021) for physical.

## 2021-04-15 ENCOUNTER — Encounter: Payer: Self-pay | Admitting: Family Medicine

## 2021-04-15 LAB — COMPREHENSIVE METABOLIC PANEL
ALT: 46 IU/L — ABNORMAL HIGH (ref 0–44)
AST: 38 IU/L (ref 0–40)
Albumin/Globulin Ratio: 2.2 (ref 1.2–2.2)
Albumin: 4.7 g/dL (ref 3.8–4.8)
Alkaline Phosphatase: 64 IU/L (ref 44–121)
BUN/Creatinine Ratio: 13 (ref 10–24)
BUN: 13 mg/dL (ref 8–27)
Bilirubin Total: 0.4 mg/dL (ref 0.0–1.2)
CO2: 23 mmol/L (ref 20–29)
Calcium: 9.6 mg/dL (ref 8.6–10.2)
Chloride: 105 mmol/L (ref 96–106)
Creatinine, Ser: 0.99 mg/dL (ref 0.76–1.27)
Globulin, Total: 2.1 g/dL (ref 1.5–4.5)
Glucose: 120 mg/dL — ABNORMAL HIGH (ref 65–99)
Potassium: 4.3 mmol/L (ref 3.5–5.2)
Sodium: 142 mmol/L (ref 134–144)
Total Protein: 6.8 g/dL (ref 6.0–8.5)
eGFR: 86 mL/min/{1.73_m2} (ref >59)

## 2021-04-15 LAB — CBC WITH DIFFERENTIAL/PLATELET
Basophils Absolute: 0.1 10*3/uL (ref 0.0–0.2)
Basos: 1 %
EOS (ABSOLUTE): 0.4 10*3/uL (ref 0.0–0.4)
Eos: 6 %
Hematocrit: 46 % (ref 37.5–51.0)
Hemoglobin: 16.1 g/dL (ref 13.0–17.7)
Immature Grans (Abs): 0 10*3/uL (ref 0.0–0.1)
Immature Granulocytes: 0 %
Lymphocytes Absolute: 2.6 10*3/uL (ref 0.7–3.1)
Lymphs: 45 %
MCH: 31 pg (ref 26.6–33.0)
MCHC: 35 g/dL (ref 31.5–35.7)
MCV: 89 fL (ref 79–97)
Monocytes Absolute: 0.7 10*3/uL (ref 0.1–0.9)
Monocytes: 11 %
Neutrophils Absolute: 2.2 10*3/uL (ref 1.4–7.0)
Neutrophils: 37 %
Platelets: 192 10*3/uL (ref 150–450)
RBC: 5.2 x10E6/uL (ref 4.14–5.80)
RDW: 13.8 % (ref 11.6–15.4)
WBC: 5.9 10*3/uL (ref 3.4–10.8)

## 2021-04-15 LAB — LIPID PANEL W/O CHOL/HDL RATIO
Cholesterol, Total: 159 mg/dL (ref 100–199)
HDL: 45 mg/dL (ref >39)
LDL Chol Calc (NIH): 97 mg/dL (ref 0–99)
Triglycerides: 89 mg/dL (ref 0–149)
VLDL Cholesterol Cal: 17 mg/dL (ref 5–40)

## 2021-04-15 LAB — VITAMIN D 25 HYDROXY (VIT D DEFICIENCY, FRACTURES): Vit D, 25-Hydroxy: 34.6 ng/mL (ref 30.0–100.0)

## 2021-04-17 ENCOUNTER — Telehealth: Payer: Self-pay

## 2021-04-22 ENCOUNTER — Encounter: Payer: Self-pay | Admitting: Family Medicine

## 2021-04-22 NOTE — Telephone Encounter (Signed)
appt

## 2021-04-27 ENCOUNTER — Encounter: Payer: Self-pay | Admitting: Family Medicine

## 2021-04-27 ENCOUNTER — Ambulatory Visit (INDEPENDENT_AMBULATORY_CARE_PROVIDER_SITE_OTHER): Payer: PPO

## 2021-04-27 VITALS — BP 130/77

## 2021-04-27 DIAGNOSIS — E782 Mixed hyperlipidemia: Secondary | ICD-10-CM

## 2021-04-27 DIAGNOSIS — I10 Essential (primary) hypertension: Secondary | ICD-10-CM

## 2021-04-27 NOTE — Progress Notes (Signed)
Chronic Care Management Pharmacy Note  04/27/2021 Name:  Derrick Sandoval. MRN:  [id number] DOB:  08-15-1956  Summary: Needs help with catheters - states [employer] bought out comfort meds - and reports catheters have gone from $0 to ~300/3 months  BP improved on today's reading - 130/77 home BP   Subjective: Derrick Sandoval is an 65y.o. year old male who is a primary patient of JValerie Roys DO.  The CCM team was consulted for assistance with disease management and care coordination needs.    Engaged with patient by telephone for follow up visit in response to provider referral for pharmacy case management and/or care coordination services.   Consent to Services:  The patient was given the following information about Chronic Care Management services today, agreed to services, and gave verbal consent: 1. CCM service includes personalized support from designated clinical staff supervised by the primary care provider, including individualized plan of care and coordination with other care providers 2. 24/7 contact phone numbers for assistance for urgent and routine care needs. 3. Service will only be billed when office clinical staff spend 20 minutes or more in a month to coordinate care. 4. Only one practitioner may furnish and bill the service in a calendar month. 5.The patient may stop CCM services at any time (effective at the end of the month) by phone call to the office staff. 6. The patient will be responsible for cost sharing (co-pay) of up to 20% of the service fee (after annual deductible is met). Patient agreed to services and consent obtained.  Patient Care Team: JValerie Roys DO as PCP - General (Family Medicine) Delgaizo, DLavon Paganini MD as Referring Physician (Orthopedic Surgery) Viprakasit, DMarlowe Shores MD as Referring Physician (Urology) [facility])  Hospital visits: None in previous 6 months  Objective:  Lab Results  Component Value Date    CREATININE 0.99 04/14/2021   CREATININE 1.04 10/07/2020   CREATININE 1.01 07/23/2019    Lab Results  Component Value Date   HGBA1C 5.8 04/21/2018   HGBA1C 5.3 12/13/2016   HGBA1C 5.6 08/25/2016   Last diabetic Eye exam: No results found for: HMDIABEYEEXA  Last diabetic Foot exam: No results found for: HMDIABFOOTEX      Component Value Date/Time   CHOL 159 04/14/2021 0904   CHOL 160 10/07/2020 1515   CHOL 182 07/23/2019 1545   CHOL 135 08/25/2016 0827   CHOL 174 01/23/2016 0908   CHOL 164 01/29/2015 1117   CHOL 93 01/17/2012 0644   TRIG 89 04/14/2021 0904   TRIG 123 10/07/2020 1515   TRIG 169 (H) 07/23/2019 1545   TRIG 78 08/25/2016 0827   TRIG 91 01/23/2016 0908   TRIG 109 01/29/2015 1117   TRIG 86 01/17/2012 0644   HDL 45 04/14/2021 0904   HDL 37 (L) 10/07/2020 1515   HDL 41 07/23/2019 1545   HDL 32 (L) 01/17/2012 0644   VLDL 16 08/25/2016 0827   VLDL 17 01/17/2012 0644   LDLCALC 97 04/14/2021 0904   LDLCALC 101 (H) 10/07/2020 1515   LDLCALC 111 (H) 07/23/2019 1545   LK[place]44 01/17/2012 0644    Hepatic Function Latest Ref Rng & Units 04/14/2021 10/07/2020 07/23/2019  Total Protein 6.0 - 8.5 g/dL 6.8 7.0 7.1  Albumin 3.8 - 4.8 g/dL 4.7 4.7 4.4  AST 0 - 40 IU/L 38 68(H) 40  ALT 0 - 44 IU/L 46(H) 83(H) 57(H)  Alk Phosphatase 44 - 121 IU/L  64 75 64  Total Bilirubin 0.0 - 1.2 mg/dL 0.4 0.4 0.3    Lab Results  Component Value Date/Time   TSH 3.400 10/07/2020 03:15 PM   TSH 2.940 07/23/2019 03:45 PM   FREET4 0.99 08/04/2015 03:53 PM    CBC Latest Ref Rng & Units 04/14/2021 10/07/2020 07/23/2019  WBC 3.4 - 10.8 x10E3/uL 5.9 7.6 6.3  Hemoglobin 13.0 - 17.7 g/dL 16.1 16.9 16.1  Hematocrit 37.5 - 51.0 % 46.0 49.0 47.5  Platelets 150 - 450 x10E3/uL 192 178 228    Lab Results  Component Value Date/Time   VD25OH 34.6 04/14/2021 09:04 AM   VD25OH 30.7 10/07/2020 03:15 PM    Clinical ASCVD:  The 10-year ASCVD risk score (Arnett DK, et al., 2019) is: 13.6%    Values used to calculate the score:     Age: 65 years     Sex: Male     Is Non-Hispanic African American: No     Diabetic: No     Tobacco smoker: No     Systolic Blood Pressure: 403 mmHg     Is BP treated: Yes     HDL Cholesterol: 45 mg/dL     Total Cholesterol: 159 mg/dL    Social History   Tobacco Use  Smoking Status Never  Smokeless Tobacco Never   BP Readings from Last 3 Encounters:  04/14/21 (!) 142/82  02/17/21 126/85  02/17/21 (!) 142/98   Pulse Readings from Last 3 Encounters:  04/14/21 66  02/17/21 72  02/17/21 73   Wt Readings from Last 3 Encounters:  04/14/21 269 lb 9.6 oz (122.3 kg)  02/17/21 270 lb 3.2 oz (122.6 kg)  02/17/21 260 lb (117.9 kg)   BMI Readings from Last 3 Encounters:  04/14/21 41.00 kg/m  02/17/21 41.08 kg/m  02/17/21 39.53 kg/m   Assessment: Review of patient past medical history, allergies, medications, health status, including review of consultants reports, laboratory and other test data, was performed as part of comprehensive evaluation and provision of chronic care management services.   SDOH:  (Social Determinants of Health) assessments and interventions performed:    CCM Care Plan  No Known Allergies  Medications Reviewed Today     Reviewed by Valerie Roys, DO (Physician) on 04/14/21 at 0859  Med List Status: <None>   Medication Order Taking? Sig Documenting Provider Last Dose Status Informant  acyclovir (ZOVIRAX) 400 MG tablet [id number]  Take 1 tablet (400 mg total) by mouth 2 (two) times daily. Johnson, Megan P, DO  Active   albuterol (VENTOLIN HFA) 108 (90 Base) MCG/ACT inhaler [id number] Yes Inhale 1-2 puffs into the lungs every 6 (six) hours as needed for wheezing or shortness of breath. Park Liter P, DO Taking Active   Ascorbic Acid (VITAMIN C) 1000 MG tablet [id number] Yes Take 1,000 mg by mouth at bedtime.  [provider] Taking Active Self  aspirin EC 81 MG tablet [id number] Yes Take 81 mg by mouth  daily. [provider] Taking Active Self  budesonide-formoterol (SYMBICORT) 160-4.5 MCG/ACT inhaler [id number]  Inhale 2 puffs into the lungs 2 (two) times daily. Johnson, Megan P, DO  Active   cholecalciferol (VITAMIN D) 1000 UNITS tablet [id number] Yes Take 1,000 Units by mouth daily.  [provider] Taking Active Self  cyclobenzaprine (FLEXERIL) 10 MG tablet [id number] Yes Take 1 tablet by mouth three times daily as needed for muscle spasm Valerie Roys, DO Taking Active   famotidine (PEPCID) 20 MG tablet [id number] Yes Take 1  tablet (20 mg total) by mouth 2 (two) times daily. Johnson, Megan P, DO Taking Active   fluticasone (FLONASE) 50 MCG/ACT nasal spray [id number] Yes Place 1 spray into both nostrils 2 (two) times daily. Johnson, Megan P, DO Taking Active   guaiFENesin (MUCINEX) 600 MG 12 hr tablet [id number] No Take 1 tablet (600 mg total) by mouth 2 (two) times daily as needed for cough or to loosen phlegm.  Patient not taking: Reported on 04/14/2021   Eulogio Bear, NP Not Taking Active   lisinopril (ZESTRIL) 20 MG tablet [id number]  Take 1 tablet (20 mg total) by mouth daily. Johnson, Megan P, DO  Active   loratadine (CLARITIN) 10 MG tablet [id number] Yes Take 1 tablet (10 mg total) by mouth daily. Park Liter P, DO Taking Active   lubiprostone (AMITIZA) 8 MCG capsule [id number]  Take 1 capsule (8 mcg total) by mouth 2 (two) times daily with a meal. Johnson, Megan P, DO  Active   methocarbamol (ROBAXIN) 500 MG tablet [id number]  Take 500 mg by mouth daily as needed. [provider]  Active   montelukast (SINGULAIR) 10 MG tablet [id number]  Take 1 tablet (10 mg total) by mouth at bedtime. Johnson, Megan P, DO  Active   Multiple Vitamins-Minerals (MULTIVITAMIN WITH MINERALS) tablet [id number] Yes Take 1 tablet by mouth daily. [provider] Taking Active Self  naproxen sodium (ALEVE) 220 MG tablet [id number] Yes Take 220 mg by mouth daily as needed.  [provider] Taking Active   nystatin cream (MYCOSTATIN) [id number] Yes Apply 1 application topically 2 (two) times daily. Wynetta Emery, Megan P, DO Taking Active   omeprazole (PRILOSEC) 40 MG capsule [id number]  Take 1 capsule (40 mg total) by mouth daily. Johnson, Megan P, DO  Active   tadalafil (CIALIS) 5 MG tablet [id number] Yes TAKE 1 TABLET BY MOUTH ONCE DAILY AS NEEDED FOR BPH Johnson, Megan P, DO Taking Active   triamcinolone ointment (KENALOG) 0.1 % [id number] Yes Apply 1 application topically 2 (two) times daily. [provider] Taking Active             Patient Active Problem List   Diagnosis Date Noted   Cervical radiculopathy 01/16/2021   Cervical spondylosis 09/16/2020   Acute non-recurrent pansinusitis 07/01/2020   Tension headache 09/18/2019   Shingles 02/19/2019   Gastric mass    Genital herpes 01/23/2016   Skin lesion of left arm 08/16/2015   Nail abnormality 08/04/2015   Short-term memory loss 08/04/2015   Dysphagia 05/12/2015   Vitamin D deficiency 05/12/2015   Blood in the stool 05/12/2015   GERD (gastroesophageal reflux disease) 01/29/2015   OA (osteoarthritis) of knee 01/29/2015   Fatty liver 01/28/2015   Left ventricular hypertrophy 01/28/2015   Morbid obesity ([place]) 01/28/2015   Rhinitis, allergic 01/28/2015   Elevated transaminase level 01/28/2015   Hyperlipidemia 01/28/2015   Benign hypertension 01/28/2015   Benign prostatic hyperplasia with lower urinary tract symptoms 01/28/2015    Immunization History  Administered Date(s) Administered   Influenza,inj,Quad PF,6+ Mos 05/12/2015, 04/29/2016, 05/31/2017, 04/21/2018, 05/04/2019   Influenza-Unspecified 07/28/2015, 05/04/2019, 05/20/2020   Moderna Sars-Covid-2 Vaccination 03/29/2020, 04/26/2020   PFIZER(Purple Top)SARS-COV-2 Vaccination 11/09/2019, 11/30/2019, 04/23/2021   Td 01/23/2016   Tdap 01/05/2012, 10/08/2017   Zoster Recombinat (Shingrix) 05/04/2019, 07/13/2019    Conditions  to be addressed/monitored: HLD HTN GERD FLD Rhinitis   There are no care plans that you recently modified to display for this patient.  Current Barriers:  Unable to independently afford treatment  regimen PAP approved on ED, Constipation  Pharmacist Clinical Goal(s):  Patient will verbalize ability to afford treatment regimen achieve adherence to monitoring guidelines and medication adherence to achieve therapeutic efficacy through collaboration with PharmD and provider.   Interventions: 1:1 collaboration with Valerie Roys, DO regarding development and update of comprehensive plan of care as evidenced by provider attestation and co-signature Inter-disciplinary care team collaboration (see longitudinal plan of care) Comprehensive medication review performed; medication list updated in electronic medical record  Hypertension (BP goal <140/90) -Controlled based on home readings -Current treatment: Lisinopril 20 mg  -Reviewed home readings  -Current home readings: today is 130/77 -Denies hypotensive/hypertensive symptoms -Educated on BP goals and benefits of medications for prevention of heart attack, stroke and kidney damage; Exercise goal of 150 minutes per week; Importance of home blood pressure monitoring; -Counseled to monitor BP at home 1-2x/week, document, and provide log at future appointments -Counseled on diet and exercise extensively Recommended to continue current medication  Hyperlipidemia: (LDL goal < 100) -Not ideally controlled ASCVD  10 yr risk<15% 04/2021.  No DM noted, non smoker.  +Morbid obestiy,  HLD, HTN.  -Current treatment: No current Rx therapy -Medications previously tried: atorvastatin noted 2016 (side effect concern/fear of statins) -Educated on Cholesterol goals;  - Will continue to monitor. No changes at this time due to patient not wanting cholesterol medication  Health Maintenance -Vaccine gaps: flu shot -Current therapy:   Medication  Assistance: None required.  Patient affirms current coverage meets needs.  Patient's preferred pharmacy is:  [facility] ([facility]) [place], [place] [address] Phone: [phone] Fax: [phone]  [place] [address]), [place] - [place] [address] Phone: [phone] Fax: [phone]  [facility]) - [place], [place] [address] Phone: [phone] Fax: [place], [place] - [place] [address] Phone: [phone] Fax: [phone]  Follow Up:  Patient agrees to Care Plan and Follow-up. Plan: HC 2 month BP check, adherence review. CPP 6 month f/u  Future Appointments  Date Time Provider [place]  06/17/2021  9:00 AM [facility] NURSE HEALTH ADVISOR [facility]  10/05/2021 10:30 AM McGowan, Larene [place] A, PA-C [facility] None  10/13/2021  8:40 AM Valerie Roys, DO [facility] P[facility]    Madelin Rear, PharmD, CPP Clinical Pharmacist Practitioner  [place]  [phone]

## 2021-04-29 ENCOUNTER — Encounter: Payer: Self-pay | Admitting: Family Medicine

## 2021-04-29 ENCOUNTER — Other Ambulatory Visit: Payer: Self-pay | Admitting: Family Medicine

## 2021-04-29 MED ORDER — FLUTICASONE PROPIONATE 50 MCG/ACT NA SUSP: 1.0000 | Freq: Two times a day (BID) | NASAL | 12 refills | Status: DC

## 2021-04-29 NOTE — Telephone Encounter (Signed)
Requested medication (s) are due for refill today:   Yes  Rx is expired  Requested medication (s) are on the active medication list:   Yes  Future visit scheduled:   Yes   Last ordered: 02/05/2020 48 g, 12 refills  Returned because Rx is expired.  Refills requested.   Requested Prescriptions  Pending Prescriptions Disp Refills   fluticasone (FLONASE) 50 MCG/ACT nasal spray [Pharmacy Med Name: Fluticasone Propionate 50 MCG/ACT Nasal Suspension] 48 g 0    Sig: Use 1 spray(s) in each nostril twice daily     Ear, Nose, and Throat: Nasal Preparations - Corticosteroids Passed - 04/29/2021  9:30 AM      Passed - Valid encounter within last 12 months    Recent Outpatient Visits           2 weeks ago Benign hypertension   [place], Ronda, DO   2 months ago Cough   [place], [place], DO   3 months ago Cervical radiculopathy   [place], Megan P, DO   4 months ago Left testicular pain   [place], [place], DO   4 months ago Penile discharge   [place], Barb Merino, DO       Future Appointments             In 1 month  [place], [place]   In 5 months McGowan, Hunt Oris, PA-C [place]   In 5 months [place], Barb Merino, DO [employer], P[facility]

## 2021-04-29 NOTE — Telephone Encounter (Signed)
Spoke with pt to make an appt. He states he is feeling a little better and would see how he feels in the next couple of days before making an appt.

## 2021-05-15 DIAGNOSIS — I10 Essential (primary) hypertension: Secondary | ICD-10-CM

## 2021-05-15 DIAGNOSIS — E782 Mixed hyperlipidemia: Secondary | ICD-10-CM

## 2021-05-21 ENCOUNTER — Other Ambulatory Visit: Payer: Self-pay

## 2021-05-21 ENCOUNTER — Ambulatory Visit (INDEPENDENT_AMBULATORY_CARE_PROVIDER_SITE_OTHER): Payer: PPO | Admitting: Internal Medicine

## 2021-05-21 ENCOUNTER — Encounter: Payer: Self-pay | Admitting: Internal Medicine

## 2021-05-21 VITALS — BP 149/89 | HR 64 | Temp 98.2°F | Ht 67.99 in | Wt 270.0 lb

## 2021-05-21 DIAGNOSIS — Z23 Encounter for immunization: Secondary | ICD-10-CM | POA: Diagnosis not present

## 2021-05-21 DIAGNOSIS — J069 Acute upper respiratory infection, unspecified: Secondary | ICD-10-CM | POA: Insufficient documentation

## 2021-05-21 DIAGNOSIS — R0981 Nasal congestion: Secondary | ICD-10-CM | POA: Diagnosis not present

## 2021-05-21 DIAGNOSIS — R070 Pain in throat: Secondary | ICD-10-CM | POA: Insufficient documentation

## 2021-05-21 MED ORDER — AZITHROMYCIN 250 MG PO TABS: ORAL_TABLET | ORAL | 0 refills | Status: AC

## 2021-05-21 MED ORDER — FEXOFENADINE HCL 180 MG PO TABS: 180.0000 mg | ORAL_TABLET | Freq: Every day | ORAL | 1 refills | Status: DC

## 2021-05-21 NOTE — Progress Notes (Signed)
BP (!) 149/89   Pulse 64   Temp 98.2 F (36.8 C) (Oral)   Ht 5' 7.99" (1.727 m)   Wt 270 lb (122.5 kg)   SpO2 95%   BMI 41.06 kg/m    Subjective:    Patient ID: Derrick Ball., male    DOB: 1955-11-02, 65 y.o.   MRN: [id number]  Chief Complaint  Patient presents with   Headache    Has been getting irritated for the past week, along with sinus drainage and ears feel plugged. Patient states he took a home test yesterday and came back negative     HPI: Derrick Sandoval. is a 65 y.o. male  Headache  This is a new (had to mow his lawn and before he did anything he had make himself do it was tired, and was ill , no fever no chillls) problem. Associated symptoms include sinus pressure and a sore throat. Pertinent negatives include no swollen glands or tingling. Associated symptoms comments: Green and bloody discharge sometime x 2 weeks ago.  Sinus Problem This is a new (had bloody and green d/c last weke) problem. The current episode started 1 to 4 weeks ago. The problem has been waxing and waning since onset. There has been no fever. Associated symptoms include headaches, sinus pressure and a sore throat. Pertinent negatives include no swollen glands.   Chief Complaint  Patient presents with   Headache    Has been getting irritated for the past week, along with sinus drainage and ears feel plugged. Patient states he took a home test yesterday and came back negative     Relevant past medical, surgical, family and social history reviewed and updated as indicated. Interim medical history since our last visit reviewed. Allergies and medications reviewed and updated.  Review of Systems  HENT:  Positive for sinus pressure and sore throat.   Neurological:  Positive for headaches. Negative for tingling.   Per HPI unless specifically indicated above     Objective:    BP (!) 149/89   Pulse 64   Temp 98.2 F (36.8 C) (Oral)   Ht 5' 7.99" (1.727 m)   Wt 270 lb (122.5 kg)    SpO2 95%   BMI 41.06 kg/m   Wt Readings from Last 3 Encounters:  05/21/21 270 lb (122.5 kg)  04/14/21 269 lb 9.6 oz (122.3 kg)  02/17/21 270 lb 3.2 oz (122.6 kg)    Physical Exam Vitals and nursing note reviewed.  Constitutional:      General: He is not in acute distress.    Appearance: Normal appearance. He is not ill-appearing or diaphoretic.  HENT:     Head: Normocephalic and atraumatic.     Right Ear: Tympanic membrane and external ear normal. There is no impacted cerumen.     Left Ear: External ear normal.     Nose: No congestion or rhinorrhea.     Mouth/Throat:     Pharynx: No oropharyngeal exudate or posterior oropharyngeal erythema.  Eyes:     Conjunctiva/sclera: Conjunctivae normal.     Pupils: Pupils are equal, round, and reactive to light.  Cardiovascular:     Rate and Rhythm: Normal rate and regular rhythm.     Heart sounds: No murmur heard.   No friction rub. No gallop.  Pulmonary:     Effort: No respiratory distress.     Breath sounds: No stridor. No wheezing or rhonchi.  Chest:     Chest wall: No tenderness.  Abdominal:     General: Abdomen is flat. Bowel sounds are normal.     Palpations: Abdomen is soft. There is no mass.     Tenderness: There is no abdominal tenderness.  Musculoskeletal:     Cervical back: Normal range of motion and neck supple. No rigidity or tenderness.     Left lower leg: No edema.  Skin:    General: Skin is warm and dry.  Neurological:     Mental Status: He is alert.    Results for orders placed or performed in visit on 04/14/21  CBC with Differential/Platelet  Result Value Ref Range   WBC 5.9 3.4 - 10.8 x10E3/uL   RBC 5.20 4.14 - 5.80 x10E6/uL   Hemoglobin 16.1 13.0 - 17.7 g/dL   Hematocrit 46.0 37.5 - 51.0 %   MCV 89 79 - 97 fL   MCH 31.0 26.6 - 33.0 pg   MCHC 35.0 31.5 - 35.7 g/dL   RDW 13.8 11.6 - 15.4 %   Platelets 192 150 - 450 x10E3/uL   Neutrophils 37 Not Estab. %   Lymphs 45 Not Estab. %   Monocytes 11 Not  Estab. %   Eos 6 Not Estab. %   Basos 1 Not Estab. %   Neutrophils Absolute 2.2 1.4 - 7.0 x10E3/uL   Lymphocytes Absolute 2.6 0.7 - 3.1 x10E3/uL   Monocytes Absolute 0.7 0.1 - 0.9 x10E3/uL   EOS (ABSOLUTE) 0.4 0.0 - 0.4 x10E3/uL   Basophils Absolute 0.1 0.0 - 0.2 x10E3/uL   Immature Granulocytes 0 Not Estab. %   Immature Grans (Abs) 0.0 0.0 - 0.1 x10E3/uL  Comprehensive metabolic panel  Result Value Ref Range   Glucose 120 (H) 65 - 99 mg/dL   BUN 13 8 - 27 mg/dL   Creatinine, Ser 0.99 0.76 - 1.27 mg/dL   eGFR 86 >59 mL/min/1.73   BUN/Creatinine Ratio 13 10 - 24   Sodium 142 134 - 144 mmol/L   Potassium 4.3 3.5 - 5.2 mmol/L   Chloride 105 96 - 106 mmol/L   CO2 23 20 - 29 mmol/L   Calcium 9.6 8.6 - 10.2 mg/dL   Total Protein 6.8 6.0 - 8.5 g/dL   Albumin 4.7 3.8 - 4.8 g/dL   Globulin, Total 2.1 1.5 - 4.5 g/dL   Albumin/Globulin Ratio 2.2 1.2 - 2.2   Bilirubin Total 0.4 0.0 - 1.2 mg/dL   Alkaline Phosphatase 64 44 - 121 IU/L   AST 38 0 - 40 IU/L   ALT 46 (H) 0 - 44 IU/L  Lipid Panel w/o Chol/HDL Ratio  Result Value Ref Range   Cholesterol, Total 159 100 - 199 mg/dL   Triglycerides 89 0 - 149 mg/dL   HDL 45 >39 mg/dL   VLDL Cholesterol Cal 17 5 - 40 mg/dL   LDL Chol Calc (NIH) 97 0 - 99 mg/dL  VITAMIN D 25 Hydroxy (Vit-D Deficiency, Fractures)  Result Value Ref Range   Vit D, 25-Hydroxy 34.6 30.0 - 100.0 ng/mL        Current Outpatient Medications:    acyclovir (ZOVIRAX) 400 MG tablet, Take 1 tablet (400 mg total) by mouth 2 (two) times daily., Disp: 180 tablet, Rfl: 1   albuterol (VENTOLIN HFA) 108 (90 Base) MCG/ACT inhaler, Inhale 1-2 puffs into the lungs every 6 (six) hours as needed for wheezing or shortness of breath., Disp: 18 g, Rfl: 3   Ascorbic Acid (VITAMIN C) 1000 MG tablet, Take 1,000 mg by mouth at bedtime. , Disp: ,  Rfl:    aspirin EC 81 MG tablet, Take 81 mg by mouth daily., Disp: , Rfl:    azithromycin (ZITHROMAX) 250 MG tablet, Take 2 tablets on day 1,  then 1 tablet daily on days 2 through 5, Disp: 6 tablet, Rfl: 0   budesonide-formoterol (SYMBICORT) 160-4.5 MCG/ACT inhaler, Inhale 2 puffs into the lungs 2 (two) times daily., Disp: 3 each, Rfl: 3   cholecalciferol (VITAMIN D) 1000 UNITS tablet, Take 1,000 Units by mouth daily. , Disp: , Rfl:    cyclobenzaprine (FLEXERIL) 10 MG tablet, Take 1 tablet by mouth three times daily as needed for muscle spasm, Disp: 30 tablet, Rfl: 0   famotidine (PEPCID) 20 MG tablet, Take 1 tablet (20 mg total) by mouth 2 (two) times daily., Disp: 180 tablet, Rfl: 1   fexofenadine (ALLEGRA ALLERGY) 180 MG tablet, Take 1 tablet (180 mg total) by mouth daily., Disp: 10 tablet, Rfl: 1   fluticasone (FLONASE) 50 MCG/ACT nasal spray, Place 1 spray into both nostrils 2 (two) times daily., Disp: 48 g, Rfl: 12   guaiFENesin (MUCINEX) 600 MG 12 hr tablet, Take 1 tablet (600 mg total) by mouth 2 (two) times daily as needed for cough or to loosen phlegm., Disp: 30 tablet, Rfl: 0   lisinopril (ZESTRIL) 20 MG tablet, Take 1 tablet (20 mg total) by mouth daily., Disp: 90 tablet, Rfl: 1   lubiprostone (AMITIZA) 8 MCG capsule, Take 1 capsule (8 mcg total) by mouth 2 (two) times daily with a meal., Disp: 180 capsule, Rfl: 1   methocarbamol (ROBAXIN) 500 MG tablet, Take 500 mg by mouth daily as needed., Disp: , Rfl:    montelukast (SINGULAIR) 10 MG tablet, Take 1 tablet (10 mg total) by mouth at bedtime., Disp: 90 tablet, Rfl: 1   Multiple Vitamins-Minerals (MULTIVITAMIN WITH MINERALS) tablet, Take 1 tablet by mouth daily., Disp: , Rfl:    naproxen sodium (ALEVE) 220 MG tablet, Take 220 mg by mouth daily as needed., Disp: , Rfl:    nystatin cream (MYCOSTATIN), Apply 1 application topically 2 (two) times daily., Disp: 30 g, Rfl: 3   omeprazole (PRILOSEC) 40 MG capsule, Take 1 capsule (40 mg total) by mouth daily., Disp: 90 capsule, Rfl: 1   tadalafil (CIALIS) 5 MG tablet, TAKE 1 TABLET BY MOUTH ONCE DAILY AS NEEDED FOR BPH, Disp: 90  tablet, Rfl: 3   triamcinolone ointment (KENALOG) 0.1 %, Apply 1 application topically 2 (two) times daily., Disp: , Rfl:     Assessment & Plan:  URI: Flu , COVID and strep ordered at this visit, both negative. pt advised to take Tylenol q 4- 6 hourly as needed. pt to take allegra q pm as needed and to call office if symptoms worsened pt verbalised understanding of such.     Problem List Items Addressed This Visit   None Visit Diagnoses     Need for influenza vaccination    -  Primary   Relevant Orders   Flu Vaccine QUAD 30moIM (Fluarix, Fluzone & Alfiuria Quad PF)   Nasal congestion       Relevant Orders   Veritor Flu A/B Waived   Rapid Strep screen([facility])   Novel Coronavirus, NAA ([facility])   Throat discomfort       Relevant Orders   Veritor Flu A/B Waived   Rapid Strep screen([facility])   Novel Coronavirus, NAA ([facility])        Orders Placed This Encounter  Procedures   Rapid Strep screen([facility])   Novel Coronavirus,  NAA ([facility])   Flu Vaccine QUAD 48moIM (Fluarix, Fluzone & Alfiuria Quad PF)   Veritor Flu A/B Waived     Meds ordered this encounter  Medications   azithromycin (ZITHROMAX) 250 MG tablet    Sig: Take 2 tablets on day 1, then 1 tablet daily on days 2 through 5    Dispense:  6 tablet    Refill:  0   fexofenadine (ALLEGRA ALLERGY) 180 MG tablet    Sig: Take 1 tablet (180 mg total) by mouth daily.    Dispense:  10 tablet    Refill:  1     Follow up plan: No follow-ups on file.

## 2021-05-22 LAB — NOVEL CORONAVIRUS, NAA: SARS-CoV-2, NAA: NOT DETECTED

## 2021-05-22 LAB — SARS-COV-2, NAA 2 DAY TAT

## 2021-05-25 LAB — RAPID STREP SCREEN (MED CTR MEBANE ONLY): Strep Gp A Ag, IA W/Reflex: NEGATIVE

## 2021-05-25 LAB — CULTURE, GROUP A STREP: Strep A Culture: NEGATIVE

## 2021-05-25 LAB — VERITOR FLU A/B WAIVED
Influenza A: NEGATIVE
Influenza B: NEGATIVE

## 2021-05-26 NOTE — Progress Notes (Signed)
Please let pt know this was normal.

## 2021-06-09 ENCOUNTER — Encounter: Payer: Self-pay | Admitting: Family Medicine

## 2021-06-09 ENCOUNTER — Other Ambulatory Visit: Payer: Self-pay | Admitting: Family Medicine

## 2021-06-09 NOTE — Telephone Encounter (Signed)
last RF 04/14/21 #180 1 RF

## 2021-06-17 ENCOUNTER — Ambulatory Visit: Payer: PPO

## 2021-06-19 ENCOUNTER — Ambulatory Visit (INDEPENDENT_AMBULATORY_CARE_PROVIDER_SITE_OTHER): Payer: PPO

## 2021-06-19 VITALS — Ht 68.0 in | Wt 270.0 lb

## 2021-06-19 DIAGNOSIS — Z0000 Encounter for general adult medical examination without abnormal findings: Secondary | ICD-10-CM

## 2021-06-19 NOTE — Progress Notes (Signed)
I connected with Derrick Kins. today by telephone and verified that I am speaking with the correct person using two identifiers. Location patient: home Location provider: work Persons participating in the virtual visit: Derrick Kins., Derrick Durand LPN.   I discussed the limitations, risks, security and privacy concerns of performing an evaluation and management service by telephone and the availability of in person appointments. I also discussed with the patient that there may be a patient responsible charge related to this service. The patient expressed understanding and verbally consented to this telephonic visit.    Interactive audio and video telecommunications were attempted between this provider and patient, however failed, due to patient having technical difficulties OR patient did not have access to video capability.  We continued and completed visit with audio only.     Vital signs may be patient reported or missing.  Subjective:   Derrick Sandoval. is a 65 y.o. male who presents for Medicare Annual/Subsequent preventive examination.  Review of Systems     Cardiac Risk Factors include: advanced age (>27men, >56 women);dyslipidemia;hypertension;male gender;obesity (BMI >30kg/m2);sedentary lifestyle     Objective:    Today's Vitals   06/19/21 0856 06/19/21 0857  Weight: 270 lb (122.5 kg)   Height: 5\' 8"  (1.727 m)   PainSc:  5    Body mass index is 41.05 kg/m.  Advanced Directives 06/19/2021 06/16/2020 06/06/2019 06/01/2018 05/29/2018 05/12/2018 04/21/2018  Does Patient Have a Medical Advance Directive? No No No Yes No No No  Would patient like information on creating a medical advance directive? - - - - No - Patient declined - Yes (MAU/Ambulatory/Procedural Areas - Information given)    Current Medications (verified) Outpatient Encounter Medications as of 06/19/2021  Medication Sig   acyclovir (ZOVIRAX) 400 MG tablet Take 1 tablet (400 mg total) by mouth 2 (two)  times daily.   albuterol (VENTOLIN HFA) 108 (90 Base) MCG/ACT inhaler Inhale 1-2 puffs into the lungs every 6 (six) hours as needed for wheezing or shortness of breath.   Ascorbic Acid (VITAMIN C) 1000 MG tablet Take 1,000 mg by mouth at bedtime.    aspirin EC 81 MG tablet Take 81 mg by mouth daily.   budesonide-formoterol (SYMBICORT) 160-4.5 MCG/ACT inhaler Inhale 2 puffs into the lungs 2 (two) times daily.   cholecalciferol (VITAMIN D) 1000 UNITS tablet Take 1,000 Units by mouth daily.    cyclobenzaprine (FLEXERIL) 10 MG tablet Take 1 tablet by mouth three times daily as needed for muscle spasm   famotidine (PEPCID) 20 MG tablet Take 1 tablet (20 mg total) by mouth 2 (two) times daily.   fexofenadine (ALLEGRA ALLERGY) 180 MG tablet Take 1 tablet (180 mg total) by mouth daily.   fluticasone (FLONASE) 50 MCG/ACT nasal spray Place 1 spray into both nostrils 2 (two) times daily.   guaiFENesin (MUCINEX) 600 MG 12 hr tablet Take 1 tablet (600 mg total) by mouth 2 (two) times daily as needed for cough or to loosen phlegm.   lisinopril (ZESTRIL) 20 MG tablet Take 1 tablet (20 mg total) by mouth daily.   lubiprostone (AMITIZA) 8 MCG capsule Take 1 capsule (8 mcg total) by mouth 2 (two) times daily with a meal.   methocarbamol (ROBAXIN) 500 MG tablet Take 500 mg by mouth daily as needed.   montelukast (SINGULAIR) 10 MG tablet Take 1 tablet (10 mg total) by mouth at bedtime.   Multiple Vitamins-Minerals (MULTIVITAMIN WITH MINERALS) tablet Take 1 tablet by mouth daily.   naproxen sodium (  ALEVE) 220 MG tablet Take 220 mg by mouth daily as needed.   nystatin cream (MYCOSTATIN) Apply 1 application topically 2 (two) times daily.   omeprazole (PRILOSEC) 40 MG capsule Take 1 capsule (40 mg total) by mouth daily.   tadalafil (CIALIS) 5 MG tablet TAKE 1 TABLET BY MOUTH ONCE DAILY AS NEEDED FOR BPH   triamcinolone ointment (KENALOG) 0.1 % Apply 1 application topically 2 (two) times daily.   No  facility-administered encounter medications on file as of 06/19/2021.    Allergies (verified) Patient has no known allergies.   History: Past Medical History:  Diagnosis Date   Allergy    Arthritis    Cervical radiculopathy 01/16/2021   Cough    nonproductive no fever saw at dr 05-26-18   GERD (gastroesophageal reflux disease)    Hyperlipidemia    Hypertension    Meningitis due to viruses 1968   Past Surgical History:  Procedure Laterality Date   COLONOSCOPY WITH PROPOFOL N/A 05/12/2018   Procedure: COLONOSCOPY WITH PROPOFOL;  Surgeon: Jonathon Bellows, MD;  Location: [facility] ENDOSCOPY;  Service: Gastroenterology;  Laterality: N/A;   ESOPHAGOGASTRODUODENOSCOPY (EGD) WITH PROPOFOL N/A 05/12/2018   Procedure: ESOPHAGOGASTRODUODENOSCOPY (EGD) WITH PROPOFOL;  Surgeon: Jonathon Bellows, MD;  Location: [facility] ENDOSCOPY;  Service: Gastroenterology;  Laterality: N/A;   ESOPHAGOGASTRODUODENOSCOPY (EGD) WITH PROPOFOL N/A 06/01/2018   Procedure: ESOPHAGOGASTRODUODENOSCOPY (EGD) WITH PROPOFOL;  Surgeon: Milus Banister, MD;  Location: [facility] ENDOSCOPY;  Service: Endoscopy;  Laterality: N/A;   EUS N/A 06/01/2018   Procedure: UPPER ENDOSCOPIC ULTRASOUND (EUS) RADIAL;  Surgeon: Milus Banister, MD;  Location: [facility] ENDOSCOPY;  Service: Endoscopy;  Laterality: N/A;   JOINT REPLACEMENT Bilateral    NASAL SINUS SURGERY     TOTAL KNEE ARTHROPLASTY     Family History  Problem Relation Age of Onset   Breast cancer Mother    Cancer Father        unsure, passed away before it was confirmed   Pancreatic cancer Daughter    Diabetes Neg Hx    Heart disease Neg Hx    Hypertension Neg Hx    Stroke Neg Hx    COPD Neg Hx    Colon cancer Neg Hx    Social History   Socioeconomic History   Marital status: Married    Spouse name: Not on file   Number of children: Not on file   Years of education: 10th grade, GED   Highest education level: GED or equivalent  Occupational History   Occupation: disabled   Tobacco Use    Smoking status: Never   Smokeless tobacco: Never  Vaping Use   Vaping Use: Never used  Substance and Sexual Activity   Alcohol use: Not Currently    Alcohol/week: 0.0 standard drinks    Comment: occasionally, twice a year    Drug use: No   Sexual activity: Not Currently  Other Topics Concern   Not on file  Social History Narrative   [employer] 3x a week    Social Determinants of Health   Financial Resource Strain: Low Risk    Difficulty of Paying Living Expenses: Not hard at all  Food Insecurity: No Food Insecurity   Worried About Charity fundraiser in the Last Year: Never true   Arboriculturist in the Last Year: Never true  Transportation Needs: No Transportation Needs   Lack of Transportation (Medical): No   Lack of Transportation (Non-Medical): No  Physical Activity: Inactive   Days of Exercise per  Week: 0 days   Minutes of Exercise per Session: 0 min  Stress: No Stress Concern Present   Feeling of Stress : Not at all  Social Connections: Not on file    Tobacco Counseling Counseling given: Not Answered   Clinical Intake:  Pre-visit preparation completed: Yes  Pain : 0-10 Pain Score: 5  Pain Type: Acute pain Pain Location: Head Pain Descriptors / Indicators: Aching Pain Onset: 1 to 4 weeks ago Pain Frequency: Intermittent     Nutritional Status: BMI > 30  Obese Nutritional Risks: None Diabetes: No  How often do you need to have someone help you when you read instructions, pamphlets, or other written materials from your doctor or pharmacy?: 1 - Never What is the last grade level you completed in school?: 10th grade, GED  Diabetic? no  Interpreter Needed?: No  Information entered by :: NAllen LPN   Activities of Daily Living In your present state of health, do you have any difficulty performing the following activities: 06/19/2021 10/07/2020  Hearing? N N  Vision? N N  Difficulty concentrating or making decisions? N N  Walking or climbing stairs? N  N  Dressing or bathing? N N  Doing errands, shopping? N N  Preparing Food and eating ? N -  Using the Toilet? N -  In the past six months, have you accidently leaked urine? Y -  Do you have problems with loss of bowel control? Y -  Managing your Medications? N -  Managing your Finances? N -  Housekeeping or managing your Housekeeping? N -  Some recent data might be hidden    Patient Care Team: Valerie Roys, DO as PCP - General (Family Medicine) Delgaizo, Lavon Paganini, MD as Referring Physician (Orthopedic Surgery) Viprakasit, Marlowe Shores, MD as Referring Physician (Urology) Vladimir Faster, [facility] (Pharmacist)  Indicate any recent Medical Services you may have received from other than [facility] providers in the past year (date may be approximate).     Assessment:   This is a routine wellness examination for Rahm.  Hearing/Vision screen Vision Screening - Comments:: No regular eye exams,  Dietary issues and exercise activities discussed: Current Exercise Habits: The patient does not participate in regular exercise at present   Goals Addressed             This Visit's Progress    Patient Stated       06/19/2021, no goals       Depression Screen PHQ 2/9 Scores 06/19/2021 05/21/2021 04/14/2021 01/16/2021 10/07/2020 06/16/2020 09/18/2019  PHQ - 2 Score 0 3 0 0 0 3 4  PHQ- 9 Score - 11 0 - - 14 13    Fall Risk Fall Risk  06/19/2021 05/21/2021 04/14/2021 01/16/2021 10/07/2020  Falls in the past year? 0 0 0 0 0  Number falls in past yr: - 0 0 0 0  Injury with Fall? - 0 0 0 0  Comment - - - - -  Risk for fall due to : Medication side effect No Fall Risks No Fall Risks No Fall Risks No Fall Risks  Follow up Falls evaluation completed;Education provided;Falls prevention discussed Falls evaluation completed Falls evaluation completed Falls evaluation completed Falls evaluation completed    FALL RISK PREVENTION PERTAINING TO THE HOME:  Any stairs in or around the home? Yes  If so, are there  any without handrails? No  Home free of loose throw rugs in walkways, pet beds, electrical cords, etc? Yes  Adequate lighting in  your home to reduce risk of falls? Yes   ASSISTIVE DEVICES UTILIZED TO PREVENT FALLS:  Life alert? No  Use of a cane, walker or w/c? No  Grab bars in the bathroom? No  Shower chair or bench in shower? No  Elevated toilet seat or a handicapped toilet? No   TIMED UP AND GO:  Was the test performed? No .      Cognitive Function:     6CIT Screen 06/19/2021 06/16/2020 04/21/2018 02/22/2017  What Year? 0 points 0 points 0 points 0 points  What month? 0 points 0 points 0 points 0 points  What time? 0 points 0 points 0 points 0 points  Count back from 20 0 points 0 points 0 points 0 points  Months in reverse 0 points 0 points 0 points 0 points  Repeat phrase 0 points 0 points 0 points 0 points  Total Score 0 0 0 0    Immunizations Immunization History  Administered Date(s) Administered   Influenza, Quadrivalent, Recombinant, Inj, Pf 05/29/2021   Influenza,inj,Quad PF,6+ Mos 05/12/2015, 04/29/2016, 05/31/2017, 04/21/2018, 05/04/2019   Influenza-Unspecified 07/28/2015, 05/04/2019, 05/20/2020   Moderna Sars-Covid-2 Vaccination 03/29/2020, 04/26/2020   PFIZER(Purple Top)SARS-COV-2 Vaccination 11/09/2019, 11/30/2019, 04/23/2021   Td 01/23/2016   Tdap 01/05/2012, 10/08/2017   Zoster Recombinat (Shingrix) 05/04/2019, 07/13/2019    TDAP status: Up to date  Flu Vaccine status: Up to date  Pneumococcal vaccine status: Up to date  Covid-19 vaccine status: Completed vaccines  Qualifies for Shingles Vaccine? Yes   Zostavax completed No   Shingrix Completed?: Yes  Screening Tests Health Maintenance  Topic Date Due   COVID-19 Vaccine (6 - Booster) 06/18/2021   Pneumococcal Vaccine 36-37 Years old (1 - PCV) 04/14/2022 (Originally 10/11/1961)   TETANUS/TDAP  10/09/2027   COLONOSCOPY (Pts 45-44yrs Insurance coverage will need to be confirmed)  05/12/2028    INFLUENZA VACCINE  Completed   Hepatitis C Screening  Completed   HIV Screening  Completed   Zoster Vaccines- Shingrix  Completed   HPV VACCINES  Aged Out    Health Maintenance  Health Maintenance Due  Topic Date Due   COVID-19 Vaccine (6 - Booster) 06/18/2021    Colorectal cancer screening: Type of screening: Colonoscopy. Completed 05/12/2018. Repeat every 10 years  Lung Cancer Screening: (Low Dose CT Chest recommended if Age 55-80 years, 30 pack-year currently smoking OR have quit w/in 15years.) does not qualify.   Lung Cancer Screening Referral: no  Additional Screening:  Hepatitis C Screening: does qualify; Completed 12/04/2020  Vision Screening: Recommended annual ophthalmology exams for early detection of glaucoma and other disorders of the eye. Is the patient up to date with their annual eye exam?  No  Who is the provider or what is the name of the office in which the patient attends annual eye exams? none If pt is not established with a provider, would they like to be referred to a provider to establish care? No .   Dental Screening: Recommended annual dental exams for proper oral hygiene  Community Resource Referral / Chronic Care Management: CRR required this visit?  No   CCM required this visit?  No      Plan:     I have personally reviewed and noted the following in the patient's chart:   Medical and social history Use of alcohol, tobacco or illicit drugs  Current medications and supplements including opioid prescriptions. Patient is not currently taking opioid prescriptions. Functional ability and status Nutritional status Physical activity Advanced directives  List of other physicians Hospitalizations, surgeries, and ER visits in previous 12 months Vitals Screenings to include cognitive, depression, and falls Referrals and appointments  In addition, I have reviewed and discussed with patient certain preventive protocols, quality metrics, and best  practice recommendations. A written personalized care plan for preventive services as well as general preventive health recommendations were provided to patient.     Kellie Simmering, LPN   [id number]   Nurse Notes: none

## 2021-06-19 NOTE — Patient Instructions (Signed)
Mr. Derrick Sandoval , Thank you for taking time to come for your Medicare Wellness Visit. I appreciate your ongoing commitment to your health goals. Please review the following plan we discussed and let me know if I can assist you in the future.   Screening recommendations/referrals: Colonoscopy: completed 05/12/2018 Recommended yearly ophthalmology/optometry visit for glaucoma screening and checkup Recommended yearly dental visit for hygiene and checkup  Vaccinations: Influenza vaccine: completed 05/29/2021 Pneumococcal vaccine: decline Tdap vaccine: completed 10/08/2017, due 10/09/2027 Shingles vaccine: completed    Covid-19: 04/23/2021, 04/26/2020, 03/29/2020, 11/30/2019, 11/09/2019  Advanced directives: Advance directive discussed with you today.   Conditions/risks identified: none  Next appointment: Follow up in one year for your annual wellness visit   Preventive Care 40-64 Years, Male Preventive care refers to lifestyle choices and visits with your health care provider that can promote health and wellness. What does preventive care include? A yearly physical exam. This is also called an annual well check. Dental exams once or twice a year. Routine eye exams. Ask your health care provider how often you should have your eyes checked. Personal lifestyle choices, including: Daily care of your teeth and gums. Regular physical activity. Eating a healthy diet. Avoiding tobacco and drug use. Limiting alcohol use. Practicing safe sex. Taking low-dose aspirin every day starting at age 33. What happens during an annual well check? The services and screenings done by your health care provider during your annual well check will depend on your age, overall health, lifestyle risk factors, and family history of disease. Counseling  Your health care provider may ask you questions about your: Alcohol use. Tobacco use. Drug use. Emotional well-being. Home and relationship well-being. Sexual  activity. Eating habits. Work and work Statistician. Screening  You may have the following tests or measurements: Height, weight, and BMI. Blood pressure. Lipid and cholesterol levels. These may be checked every 5 years, or more frequently if you are over 44 years old. Skin check. Lung cancer screening. You may have this screening every year starting at age 42 if you have a 30-pack-year history of smoking and currently smoke or have quit within the past 15 years. Fecal occult blood test (FOBT) of the stool. You may have this test every year starting at age 13. Flexible sigmoidoscopy or colonoscopy. You may have a sigmoidoscopy every 5 years or a colonoscopy every 10 years starting at age 26. Prostate cancer screening. Recommendations will vary depending on your family history and other risks. Hepatitis C blood test. Hepatitis B blood test. Sexually transmitted disease (STD) testing. Diabetes screening. This is done by checking your blood sugar (glucose) after you have not eaten for a while (fasting). You may have this done every 1-3 years. Discuss your test results, treatment options, and if necessary, the need for more tests with your health care provider. Vaccines  Your health care provider may recommend certain vaccines, such as: Influenza vaccine. This is recommended every year. Tetanus, diphtheria, and acellular pertussis (Tdap, Td) vaccine. You may need a Td booster every 10 years. Zoster vaccine. You may need this after age 90. Pneumococcal 13-valent conjugate (PCV13) vaccine. You may need this if you have certain conditions and have not been vaccinated. Pneumococcal polysaccharide (PPSV23) vaccine. You may need one or two doses if you smoke cigarettes or if you have certain conditions. Talk to your health care provider about which screenings and vaccines you need and how often you need them. This information is not intended to replace advice given to you by your health  care provider.  Make sure you discuss any questions you have with your health care provider. Document Released: 08/29/2015 Document Revised: 04/21/2016 Document Reviewed: 06/03/2015 Elsevier Interactive Patient Education  2017 [place] Prevention in the Home Falls can cause injuries. They can happen to people of all ages. There are many things you can do to make your home safe and to help prevent falls. What can I do on the outside of my home? Regularly fix the edges of walkways and driveways and fix any cracks. Remove anything that might make you trip as you walk through a door, such as a raised step or threshold. Trim any bushes or trees on the path to your home. Use bright outdoor lighting. Clear any walking paths of anything that might make someone trip, such as rocks or tools. Regularly check to see if handrails are loose or broken. Make sure that both sides of any steps have handrails. Any raised decks and porches should have guardrails on the edges. Have any leaves, snow, or ice cleared regularly. Use sand or salt on walking paths during winter. Clean up any spills in your garage right away. This includes oil or grease spills. What can I do in the bathroom? Use night lights. Install grab bars by the toilet and in the tub and shower. Do not use towel bars as grab bars. Use non-skid mats or decals in the tub or shower. If you need to sit down in the shower, use a plastic, non-slip stool. Keep the floor dry. Clean up any water that spills on the floor as soon as it happens. Remove soap buildup in the tub or shower regularly. Attach bath mats securely with double-sided non-slip rug tape. Do not have throw rugs and other things on the floor that can make you trip. What can I do in the bedroom? Use night lights. Make sure that you have a light by your bed that is easy to reach. Do not use any sheets or blankets that are too big for your bed. They should not hang down onto the floor. Have a  firm chair that has side arms. You can use this for support while you get dressed. Do not have throw rugs and other things on the floor that can make you trip. What can I do in the kitchen? Clean up any spills right away. Avoid walking on wet floors. Keep items that you use a lot in easy-to-reach places. If you need to reach something above you, use a strong step stool that has a grab bar. Keep electrical cords out of the way. Do not use floor polish or wax that makes floors slippery. If you must use wax, use non-skid floor wax. Do not have throw rugs and other things on the floor that can make you trip. What can I do with my stairs? Do not leave any items on the stairs. Make sure that there are handrails on both sides of the stairs and use them. Fix handrails that are broken or loose. Make sure that handrails are as long as the stairways. Check any carpeting to make sure that it is firmly attached to the stairs. Fix any carpet that is loose or worn. Avoid having throw rugs at the top or bottom of the stairs. If you do have throw rugs, attach them to the floor with carpet tape. Make sure that you have a light switch at the top of the stairs and the bottom of the stairs. If you do not  have them, ask someone to add them for you. What else can I do to help prevent falls? Wear shoes that: Do not have high heels. Have rubber bottoms. Are comfortable and fit you well. Are closed at the toe. Do not wear sandals. If you use a stepladder: Make sure that it is fully opened. Do not climb a closed stepladder. Make sure that both sides of the stepladder are locked into place. Ask someone to hold it for you, if possible. Clearly mark and make sure that you can see: Any grab bars or handrails. First and last steps. Where the edge of each step is. Use tools that help you move around (mobility aids) if they are needed. These include: Canes. Walkers. Scooters. Crutches. Turn on the lights when you  go into a dark area. Replace any light bulbs as soon as they burn out. Set up your furniture so you have a clear path. Avoid moving your furniture around. If any of your floors are uneven, fix them. If there are any pets around you, be aware of where they are. Review your medicines with your doctor. Some medicines can make you feel dizzy. This can increase your chance of falling. Ask your doctor what other things that you can do to help prevent falls. This information is not intended to replace advice given to you by your health care provider. Make sure you discuss any questions you have with your health care provider. Document Released: 05/29/2009 Document Revised: 01/08/2016 Document Reviewed: 09/06/2014 Elsevier Interactive Patient Education  2017 [employer].

## 2021-06-24 ENCOUNTER — Encounter: Payer: Self-pay | Admitting: Internal Medicine

## 2021-06-24 ENCOUNTER — Telehealth (INDEPENDENT_AMBULATORY_CARE_PROVIDER_SITE_OTHER): Payer: PPO | Admitting: Internal Medicine

## 2021-06-24 DIAGNOSIS — R197 Diarrhea, unspecified: Secondary | ICD-10-CM | POA: Diagnosis not present

## 2021-06-24 DIAGNOSIS — J069 Acute upper respiratory infection, unspecified: Secondary | ICD-10-CM

## 2021-06-24 LAB — VERITOR FLU A/B WAIVED
Influenza A: NEGATIVE
Influenza B: NEGATIVE

## 2021-06-24 MED ORDER — LACTINEX PO CHEW: 1.0000 | CHEWABLE_TABLET | ORAL | 1 refills | Status: DC | PRN

## 2021-06-24 NOTE — Addendum Note (Signed)
Addended by: Georgina Peer on: 06/24/2021 03:56 PM   Modules accepted: Orders

## 2021-06-24 NOTE — Progress Notes (Signed)
There were no vitals taken for this visit.   Subjective:    Patient ID: Derrick Ball., male    DOB: 21-Jun-1956, 65 y.o.   MRN: [id number]  Chief Complaint  Patient presents with   Diarrhea    Pt states he has been having diarrhea since Monday. States his stomach was hurting and cramping early Monday morning, had a normal BM after sitting on the commode for a little bit, then started having diarrhea shortly after. States he has also noticed some blood when wiping as well.     HPI: Derrick Sandoval. is a 65 y.o. male   This visit was completed via video visit through MyChart due to the restrictions of the COVID-19 pandemic. All issues as above were discussed and addressed. Physical exam was done as above through visual confirmation on video through MyChart. If it was felt that the patient should be evaluated in the office, they were directed there. The patient verbally consented to this visit. Location of the patient: home Location of the provider: work Those involved with this call:  Provider: Charlynne Cousins, MD CMA: Yvonna Alanis, [place] Desk/Registration: Myrlene Broker  Time spent on call: 10 minutes with patient face to face via video conference. More than 50% of this time was spent in counseling and coordination of care. 10 minutes total spent in review of patient's record and preparation of their chart.    Diarrhea  This is a new (symptoms started 2 weeks ago , sinuses were huritng, better now, monday 3 am stomach started hurting across belly button, went to the bathroom - started sweating and then had a normal stool, layed down and then had diarrhea - very loose stools.) problem. The current episode started in the past 7 days (last episode diarrhea yesterday x 2 times, had loose stools x 2). The problem has been waxing and waning. The stool consistency is described as Watery. Associated symptoms include chills, coughing and a URI. Pertinent negatives include no abdominal  pain, arthralgias, bloating, fever, headaches, increased  flatus, myalgias, sweats, vomiting or weight loss. Associated symptoms comments: Monday afternoon noticed some blood when he wiped he noticed red blood Lost appetite no taste.   Temp 99 . He has tried increased fluids and electrolyte solution for the symptoms.   Chief Complaint  Patient presents with   Diarrhea    Pt states he has been having diarrhea since Monday. States his stomach was hurting and cramping early Monday morning, had a normal BM after sitting on the commode for a little bit, then started having diarrhea shortly after. States he has also noticed some blood when wiping as well.     Relevant past medical, surgical, family and social history reviewed and updated as indicated. Interim medical history since our last visit reviewed. Allergies and medications reviewed and updated.  Review of Systems  Constitutional:  Positive for chills. Negative for fever and weight loss.  Respiratory:  Positive for cough.   Gastrointestinal:  Positive for diarrhea. Negative for abdominal pain, bloating, flatus and vomiting.  Musculoskeletal:  Negative for arthralgias and myalgias.  Neurological:  Negative for headaches.   Per HPI unless specifically indicated above     Objective:    There were no vitals taken for this visit.  Wt Readings from Last 3 Encounters:  06/19/21 270 lb (122.5 kg)  05/21/21 270 lb (122.5 kg)  04/14/21 269 lb 9.6 oz (122.3 kg)    Physical Exam  Unable to peform  sec to virtual visit.   Results for orders placed or performed in visit on 05/21/21  Novel Coronavirus, NAA ([facility])   Specimen: Nasopharyngeal(NP) swabs in vial transport medium  Result Value Ref Range   SARS-CoV-2, NAA Not Detected Not Detected  Rapid Strep Screen ([facility] ONLY)   Naso  Result Value Ref Range   Strep Gp A Ag, IA W/Reflex Negative Negative  Culture, Group A Strep   Naso  Result Value Ref Range   Strep A Culture  Negative   SARS-COV-2, NAA 2 DAY TAT  Result Value Ref Range   SARS-CoV-2, NAA 2 DAY TAT Performed   Veritor Flu A/B Waived  Result Value Ref Range   Influenza A Negative Negative   Influenza B Negative Negative           Assessment & Plan:  URI: Flu and COVID ordered at this visit Increase fluid intake.  Pt will need to come in wait in the car and get swabbed for flu and COVID test today  Pt verbalized understanding of such, to get to the office at today and get a curb side test for the above.  pt advised to take Tylenol q 4- 6 hourly as needed. pt to take allegra q pm as needed and to call office if symptoms worsened pt verbalised understanding of such.     Problem List Items Addressed This Visit   None   No orders of the defined types were placed in this encounter.    Meds ordered this encounter  Medications   lactobacillus acidophilus & bulgar (LACTINEX) chewable tablet    Sig: Chew 1 tablet by mouth as needed (after each loose stool).    Dispense:  20 tablet    Refill:  1      Follow up plan: No follow-ups on file.

## 2021-06-25 ENCOUNTER — Encounter: Payer: Self-pay | Admitting: Internal Medicine

## 2021-06-25 LAB — SARS-COV-2, NAA 2 DAY TAT

## 2021-06-25 LAB — NOVEL CORONAVIRUS, NAA: SARS-CoV-2, NAA: NOT DETECTED

## 2021-06-25 NOTE — Progress Notes (Signed)
Please let pt know this was normal.

## 2021-06-26 NOTE — Telephone Encounter (Signed)
Could you pl call pt and Pl advice him to go to the ER if he continues to have bloody diarrhea.  All the tests done here were negative. Needs to get scanned. Thnx.

## 2021-06-26 NOTE — Telephone Encounter (Signed)
Patient made aware of results and verbalized understanding.  

## 2021-07-03 ENCOUNTER — Telehealth: Payer: Self-pay

## 2021-07-03 NOTE — Chronic Care Management (AMB) (Signed)
    Chronic Care Management Pharmacy Assistant   Name: Derrick Sandoval.  MRN: [id number] DOB: [birth year]  Reason for Encounter: Patient assistance renewal 2023   I called patient to inform him that I sent out a patient assistance 2023 renewal form for his Cialis. I informed him that the form is prefilled and that he needs to sign and provide income verification. I have attached instructions and my contact information if needed. Patient stated understanding  Andee Poles, [place]

## 2021-07-07 ENCOUNTER — Telehealth: Payer: Self-pay | Admitting: Family Medicine

## 2021-07-07 NOTE — Telephone Encounter (Signed)
Pt dropped off signed pt assistance forms to be signed by provider. Placed in provider's folder.

## 2021-07-10 ENCOUNTER — Encounter: Payer: Self-pay | Admitting: Family Medicine

## 2021-07-13 ENCOUNTER — Encounter: Payer: Self-pay | Admitting: Family Medicine

## 2021-07-14 ENCOUNTER — Ambulatory Visit (INDEPENDENT_AMBULATORY_CARE_PROVIDER_SITE_OTHER): Payer: PPO | Admitting: Family Medicine

## 2021-07-14 ENCOUNTER — Encounter: Payer: Self-pay | Admitting: Family Medicine

## 2021-07-14 VITALS — BP 155/99 | HR 85

## 2021-07-14 DIAGNOSIS — J069 Acute upper respiratory infection, unspecified: Secondary | ICD-10-CM | POA: Diagnosis not present

## 2021-07-14 MED ORDER — PREDNISONE 50 MG PO TABS: 50.0000 mg | ORAL_TABLET | Freq: Every day | ORAL | 0 refills | Status: DC

## 2021-07-14 NOTE — Progress Notes (Signed)
BP (!) 155/99   Pulse 85    Subjective:    Patient ID: Derrick Ball., male    DOB: [birth year], 65 y.o.   MRN: [id number]  HPI: Derrick Sandoval. is a 65 y.o. male  Chief Complaint  Patient presents with   URI    Patient states he is having headache, sinus drainage he is blowing light green mucus. Patient states he feels very tired. Patient states he tested negative for COVID.    UPPER RESPIRATORY TRACT INFECTION Duration: about a week Worst symptom: congestion and drainage Fever: no Cough: yes Shortness of breath: no Wheezing: no Chest pain: no Chest tightness: no Chest congestion: no Nasal congestion: yes Runny nose: yes Post nasal drip: yes Sneezing: yes Sore throat: no Swollen glands: no Sinus pressure: yes Headache: yes Face pain: yes Toothache: no Ear pain: no  Ear pressure: yes bilateral Eyes red/itching:no Eye drainage/crusting: no  Vomiting: no Rash: no Fatigue: yes Sick contacts: no Strep contacts: no  Context: worse Recurrent sinusitis: yes Relief with OTC cold/cough medications: no  Treatments attempted: cold/sinus, mucinex, anti-histamine, and pseudoephedrine    Relevant past medical, surgical, family and social history reviewed and updated as indicated. Interim medical history since our last visit reviewed. Allergies and medications reviewed and updated.  Review of Systems  Constitutional:  Positive for chills, diaphoresis, fatigue and fever. Negative for activity change, appetite change and unexpected weight change.  HENT:  Positive for congestion, nosebleeds, postnasal drip, rhinorrhea, sinus pressure, sinus pain and sneezing. Negative for dental problem, drooling, ear discharge, ear pain, facial swelling, hearing loss, mouth sores, sore throat, tinnitus, trouble swallowing and voice change.   Respiratory:  Positive for cough. Negative for apnea, choking, chest tightness, shortness of breath, wheezing and stridor.   Cardiovascular:  Negative.   Gastrointestinal: Negative.   Neurological: Negative.   Psychiatric/Behavioral: Negative.     Per HPI unless specifically indicated above     Objective:    BP (!) 155/99   Pulse 85   Wt Readings from Last 3 Encounters:  06/19/21 270 lb (122.5 kg)  05/21/21 270 lb (122.5 kg)  04/14/21 269 lb 9.6 oz (122.3 kg)    Physical Exam Vitals and nursing note reviewed.  Constitutional:      General: He is not in acute distress.    Appearance: Normal appearance. He is not ill-appearing, toxic-appearing or diaphoretic.  HENT:     Head: Normocephalic and atraumatic.     Right Ear: External ear normal.     Left Ear: External ear normal.     Nose: Nose normal.     Mouth/Throat:     Mouth: Mucous membranes are moist.     Pharynx: Oropharynx is clear.  Eyes:     General: No scleral icterus.       Right eye: No discharge.        Left eye: No discharge.     Conjunctiva/sclera: Conjunctivae normal.     Pupils: Pupils are equal, round, and reactive to light.  Pulmonary:     Effort: Pulmonary effort is normal. No respiratory distress.     Comments: Speaking in full sentences Musculoskeletal:        General: Normal range of motion.     Cervical back: Normal range of motion.  Skin:    Coloration: Skin is not jaundiced or pale.     Findings: No bruising, erythema, lesion or rash.  Neurological:     Mental Status: He is alert  and oriented to person, place, and time. Mental status is at baseline.  Psychiatric:        Mood and Affect: Mood normal.        Behavior: Behavior normal.        Thought Content: Thought content normal.        Judgment: Judgment normal.    Results for orders placed or performed in visit on 06/24/21  Novel Coronavirus, NAA ([facility])   Specimen: Nasopharyngeal(NP) swabs in vial transport medium  Result Value Ref Range   SARS-CoV-2, NAA Not Detected Not Detected  SARS-COV-2, NAA 2 DAY TAT  Result Value Ref Range   SARS-CoV-2, NAA 2 DAY TAT Performed    Veritor Flu A/B Waived  Result Value Ref Range   Influenza A Negative Negative   Influenza B Negative Negative      Assessment & Plan:   Problem List Items Addressed This Visit       Respiratory   Upper respiratory tract infection - Primary    Will come in for flu and COVID swabs. Treat with prednisone. Call if not better in a couple of days and we'll send doxy.      Relevant Orders   Veritor Flu A/B Waived   Novel Coronavirus, NAA ([facility])     Follow up plan: Return if symptoms worsen or fail to improve.     This visit was completed via video visit through MyChart due to the restrictions of the COVID-19 pandemic. All issues as above were discussed and addressed. Physical exam was done as above through visual confirmation on video through MyChart. If it was felt that the patient should be evaluated in the office, they were directed there. The patient verbally consented to this visit. Location of the patient: home Location of the provider: work Those involved with this call:  Provider: Park Liter, DO CMA: Louanna Raw, [place] Desk/Registration: [employer]  Time spent on call:  15 minutes with patient face to face via video conference. More than 50% of this time was spent in counseling and coordination of care. 23 minutes total spent in review of patient's record and preparation of their chart.

## 2021-07-14 NOTE — Assessment & Plan Note (Signed)
Will come in for flu and COVID swabs. Treat with prednisone. Call if not better in a couple of days and we'll send doxy.

## 2021-07-15 LAB — VERITOR FLU A/B WAIVED
Influenza A: NEGATIVE
Influenza B: NEGATIVE

## 2021-07-15 LAB — NOVEL CORONAVIRUS, NAA: SARS-CoV-2, NAA: DETECTED — AB

## 2021-07-15 LAB — SARS-COV-2, NAA 2 DAY TAT

## 2021-07-23 ENCOUNTER — Encounter: Payer: Self-pay | Admitting: Family Medicine

## 2021-07-23 MED ORDER — DOXYCYCLINE HYCLATE 100 MG PO TABS: 100.0000 mg | ORAL_TABLET | Freq: Two times a day (BID) | ORAL | 0 refills | Status: DC

## 2021-07-23 NOTE — Telephone Encounter (Signed)
appt

## 2021-07-23 NOTE — Telephone Encounter (Signed)
Disregard last message.

## 2021-07-27 ENCOUNTER — Telehealth: Payer: Self-pay

## 2021-07-27 NOTE — Telephone Encounter (Signed)
  Chronic Care Management  Outreach Note   Name: Derrick Sandoval. MRN: [id number] DOB: 1955/10/27  Referred by: Valerie Roys, DO Reason for referral: Medication assistance  Spoke with [facility] regarding additional information needed on patient application. Insurance information provided. Confirmed no additional information needed. Ralph Leyden will finalize application processing and update practice and patient once approved.    Madelin Rear, PharmD Clinical Pharmacist  [place] [phone]

## 2021-07-30 DIAGNOSIS — N35919 Unspecified urethral stricture, male, unspecified site: Secondary | ICD-10-CM | POA: Diagnosis not present

## 2021-07-30 DIAGNOSIS — N401 Enlarged prostate with lower urinary tract symptoms: Secondary | ICD-10-CM | POA: Diagnosis not present

## 2021-07-30 DIAGNOSIS — N319 Neuromuscular dysfunction of bladder, unspecified: Secondary | ICD-10-CM | POA: Diagnosis not present

## 2021-07-30 DIAGNOSIS — R339 Retention of urine, unspecified: Secondary | ICD-10-CM | POA: Diagnosis not present

## 2021-07-30 MED ORDER — FEXOFENADINE HCL 180 MG PO TABS: 180.0000 mg | ORAL_TABLET | Freq: Every day | ORAL | 12 refills | Status: DC

## 2021-07-30 NOTE — Addendum Note (Signed)
Addended by: Valerie Roys on: 07/30/2021 12:30 PM   Modules accepted: Orders

## 2021-08-03 ENCOUNTER — Ambulatory Visit
Admission: RE | Admit: 2021-08-03 | Discharge: 2021-08-03 | Disposition: A | Discharge status: Home / Self Care | Payer: PPO | Source: Ambulatory Visit | Attending: Family Medicine | Admitting: Family Medicine

## 2021-08-03 ENCOUNTER — Other Ambulatory Visit: Payer: Self-pay

## 2021-08-03 ENCOUNTER — Ambulatory Visit
Admission: RE | Admit: 2021-08-03 | Discharge: 2021-08-03 | Disposition: A | Discharge status: Home / Self Care | Payer: PPO | Attending: Family Medicine | Admitting: Family Medicine

## 2021-08-03 ENCOUNTER — Ambulatory Visit (INDEPENDENT_AMBULATORY_CARE_PROVIDER_SITE_OTHER): Payer: PPO | Admitting: Family Medicine

## 2021-08-03 ENCOUNTER — Encounter: Payer: Self-pay | Admitting: Family Medicine

## 2021-08-03 VITALS — BP 123/77 | HR 69 | Temp 98.1°F | Wt 274.6 lb

## 2021-08-03 DIAGNOSIS — U099 Post covid-19 condition, unspecified: Secondary | ICD-10-CM

## 2021-08-03 DIAGNOSIS — R053 Chronic cough: Secondary | ICD-10-CM | POA: Diagnosis not present

## 2021-08-03 DIAGNOSIS — R059 Cough, unspecified: Secondary | ICD-10-CM | POA: Diagnosis not present

## 2021-08-03 NOTE — Telephone Encounter (Signed)
appt

## 2021-08-03 NOTE — Progress Notes (Signed)
BP 123/77    Pulse 69    Temp 98.1 F (36.7 C)    Wt 274 lb 9.6 oz (124.6 kg)    SpO2 96%    BMI 41.75 kg/m    Subjective:    Patient ID: Derrick Sandoval., male    DOB: 09-Sep-1955, 65 y.o.   MRN: [id number]  HPI: Derrick Sandoval. is a 65 y.o. male  Chief Complaint  Patient presents with   Cough    Patient states he has been coughing, congestion, sneezing and headache. Patient tested positive for COVID on 07/14/21. Patient states symptoms have not cleared up since then.   UPPER RESPIRATORY TRACT INFECTION Duration: about 3 weeks Worst symptom: sneezing, congestion, coughing Fever: no Cough: yes Shortness of breath: yes Wheezing: no Chest pain: no Chest tightness: no Chest congestion: yes Nasal congestion: yes Runny nose: yes Post nasal drip: yes Sneezing: yes Sore throat: yes Swollen glands: no Sinus pressure: yes Headache: yes Face pain: yes Toothache: no Ear pain: yes, bilateral  Ear pressure: no  Eyes red/itching:no Eye drainage/crusting: no  Vomiting: no Rash: no Fatigue: yes Sick contacts: yes Strep contacts: no  Context: stable Recurrent sinusitis: no Relief with OTC cold/cough medications: no  Treatments attempted: steroid, doxycycline   Relevant past medical, surgical, family and social history reviewed and updated as indicated. Interim medical history since our last visit reviewed. Allergies and medications reviewed and updated.  Review of Systems  Constitutional:  Positive for chills, diaphoresis, fatigue and fever. Negative for activity change, appetite change and unexpected weight change.  HENT:  Positive for congestion, postnasal drip, rhinorrhea, sinus pressure, sinus pain and sneezing. Negative for dental problem, drooling, ear discharge, ear pain, facial swelling, hearing loss, mouth sores, nosebleeds, sore throat, tinnitus, trouble swallowing and voice change.   Eyes: Negative.   Respiratory:  Positive for cough, chest tightness,  shortness of breath and wheezing. Negative for apnea, choking and stridor.   Cardiovascular: Negative.   Gastrointestinal: Negative.   Musculoskeletal: Negative.   Psychiatric/Behavioral: Negative.     Per HPI unless specifically indicated above     Objective:    BP 123/77    Pulse 69    Temp 98.1 F (36.7 C)    Wt 274 lb 9.6 oz (124.6 kg)    SpO2 96%    BMI 41.75 kg/m   Wt Readings from Last 3 Encounters:  08/03/21 274 lb 9.6 oz (124.6 kg)  06/19/21 270 lb (122.5 kg)  05/21/21 270 lb (122.5 kg)    Physical Exam Vitals and nursing note reviewed.  Constitutional:      General: He is not in acute distress.    Appearance: Normal appearance. He is obese. He is not ill-appearing, toxic-appearing or diaphoretic.  HENT:     Head: Normocephalic and atraumatic.     Right Ear: Tympanic membrane, ear canal and external ear normal. There is no impacted cerumen.     Left Ear: Tympanic membrane, ear canal and external ear normal. There is no impacted cerumen.     Nose: Nose normal. No congestion or rhinorrhea.     Mouth/Throat:     Mouth: Mucous membranes are moist.     Pharynx: Oropharynx is clear. No oropharyngeal exudate or posterior oropharyngeal erythema.  Eyes:     General: No scleral icterus.       Right eye: No discharge.        Left eye: No discharge.     Extraocular Movements: Extraocular movements  intact.     Conjunctiva/sclera: Conjunctivae normal.     Pupils: Pupils are equal, round, and reactive to light.  Neck:     Vascular: No carotid bruit.  Cardiovascular:     Rate and Rhythm: Normal rate and regular rhythm.     Pulses: Normal pulses.     Heart sounds: Normal heart sounds. No murmur heard.   No friction rub. No gallop.  Pulmonary:     Effort: Pulmonary effort is normal. No respiratory distress.     Breath sounds: Normal breath sounds. No stridor. No wheezing, rhonchi or rales.  Chest:     Chest wall: No tenderness.  Musculoskeletal:        General: Normal range  of motion.     Cervical back: Normal range of motion and neck supple. No rigidity or tenderness.  Lymphadenopathy:     Cervical: No cervical adenopathy.  Skin:    General: Skin is warm and dry.     Capillary Refill: Capillary refill takes less than 2 seconds.     Coloration: Skin is not jaundiced or pale.     Findings: No bruising, erythema, lesion or rash.  Neurological:     General: No focal deficit present.     Mental Status: He is alert and oriented to person, place, and time. Mental status is at baseline.  Psychiatric:        Mood and Affect: Mood normal.        Behavior: Behavior normal.        Thought Content: Thought content normal.        Judgment: Judgment normal.    Results for orders placed or performed in visit on 07/14/21  Novel Coronavirus, NAA ([facility])   Specimen: Saline  Result Value Ref Range   SARS-CoV-2, NAA Detected (A) Not Detected  SARS-COV-2, NAA 2 DAY TAT  Result Value Ref Range   SARS-CoV-2, NAA 2 DAY TAT Performed   Veritor Flu A/B Waived  Result Value Ref Range   Influenza A Negative Negative   Influenza B Negative Negative      Assessment & Plan:   Problem List Items Addressed This Visit   None Visit Diagnoses     Post-COVID chronic cough    -  Primary   Will check CXR. Await results. Treat as needed.    Relevant Orders   DG Chest 2 View        Follow up plan: No follow-ups on file.

## 2021-08-18 ENCOUNTER — Encounter: Payer: Self-pay | Admitting: Nurse Practitioner

## 2021-08-18 ENCOUNTER — Ambulatory Visit (INDEPENDENT_AMBULATORY_CARE_PROVIDER_SITE_OTHER): Payer: PPO | Admitting: Nurse Practitioner

## 2021-08-18 ENCOUNTER — Other Ambulatory Visit: Payer: Self-pay

## 2021-08-18 VITALS — BP 127/87 | HR 87 | Temp 98.2°F | Resp 18

## 2021-08-18 DIAGNOSIS — J0100 Acute maxillary sinusitis, unspecified: Secondary | ICD-10-CM

## 2021-08-18 DIAGNOSIS — H6981 Other specified disorders of Eustachian tube, right ear: Secondary | ICD-10-CM

## 2021-08-18 MED ORDER — PREDNISONE 10 MG PO TABS: ORAL_TABLET | ORAL | 0 refills | Status: DC

## 2021-08-18 MED ORDER — AMOXICILLIN-POT CLAVULANATE 875-125 MG PO TABS: 1.0000 | ORAL_TABLET | Freq: Two times a day (BID) | ORAL | 0 refills | Status: DC

## 2021-08-18 NOTE — Progress Notes (Signed)
Acute Office Visit  Subjective:    Patient ID: Derrick Gras., male    DOB: 1956-04-01, 66 y.o.   MRN: [id number]  Chief Complaint  Patient presents with   Ear Pain    Right ear pain since Thursday. It feels full and when he chews he can hear "squishing"    HPI Patient is in today for right ear pain since Thursday.  EAR PAIN  Duration: days Involved ear(s): right Severity:  5/10  Quality:  aching, numb, feels full Fever: no Otorrhea: no Upper respiratory infection symptoms: yes Pruritus: no Hearing loss: no Water immersion no Using Q-tips: yes Recurrent otitis media: no Status: worse Treatments attempted: pseudoephedrine, mucinex   Past Medical History:  Diagnosis Date   Allergy    Arthritis    Cervical radiculopathy 01/16/2021   Cough    nonproductive no fever saw at dr 05-26-18   GERD (gastroesophageal reflux disease)    Hyperlipidemia    Hypertension    Meningitis due to viruses 1968    Past Surgical History:  Procedure Laterality Date   COLONOSCOPY WITH PROPOFOL N/A 05/12/2018   Procedure: COLONOSCOPY WITH PROPOFOL;  Surgeon: Jonathon Bellows, MD;  Location: [facility] ENDOSCOPY;  Service: Gastroenterology;  Laterality: N/A;   ESOPHAGOGASTRODUODENOSCOPY (EGD) WITH PROPOFOL N/A 05/12/2018   Procedure: ESOPHAGOGASTRODUODENOSCOPY (EGD) WITH PROPOFOL;  Surgeon: Jonathon Bellows, MD;  Location: [facility] ENDOSCOPY;  Service: Gastroenterology;  Laterality: N/A;   ESOPHAGOGASTRODUODENOSCOPY (EGD) WITH PROPOFOL N/A 06/01/2018   Procedure: ESOPHAGOGASTRODUODENOSCOPY (EGD) WITH PROPOFOL;  Surgeon: Milus Banister, MD;  Location: [facility] ENDOSCOPY;  Service: Endoscopy;  Laterality: N/A;   EUS N/A 06/01/2018   Procedure: UPPER ENDOSCOPIC ULTRASOUND (EUS) RADIAL;  Surgeon: Milus Banister, MD;  Location: [facility] ENDOSCOPY;  Service: Endoscopy;  Laterality: N/A;   JOINT REPLACEMENT Bilateral    NASAL SINUS SURGERY     TOTAL KNEE ARTHROPLASTY      Family History  Problem Relation Age of  Onset   Breast cancer Mother    Cancer Father        unsure, passed away before it was confirmed   Pancreatic cancer Daughter    Diabetes Neg Hx    Heart disease Neg Hx    Hypertension Neg Hx    Stroke Neg Hx    COPD Neg Hx    Colon cancer Neg Hx     Social History   Socioeconomic History   Marital status: Married    Spouse name: Not on file   Number of children: Not on file   Years of education: 10th grade, GED   Highest education level: GED or equivalent  Occupational History   Occupation: disabled   Tobacco Use   Smoking status: Never   Smokeless tobacco: Never  Vaping Use   Vaping Use: Never used  Substance and Sexual Activity   Alcohol use: Not Currently    Alcohol/week: 0.0 standard drinks    Comment: occasionally, twice a year    Drug use: No   Sexual activity: Not Currently  Other Topics Concern   Not on file  Social History Narrative   [employer] 3x a week    Social Determinants of Health   Financial Resource Strain: Low Risk    Difficulty of Paying Living Expenses: Not hard at all  Food Insecurity: No Food Insecurity   Worried About Charity fundraiser in the Last Year: Never true   Ran Out of Food in the Last Year: Never true  Transportation Needs: No  Transportation Needs   Lack of Transportation (Medical): No   Lack of Transportation (Non-Medical): No  Physical Activity: Inactive   Days of Exercise per Week: 0 days   Minutes of Exercise per Session: 0 min  Stress: No Stress Concern Present   Feeling of Stress : Not at all  Social Connections: Not on file  Intimate Partner Violence: Not on file    Outpatient Medications Prior to Visit  Medication Sig Dispense Refill   acyclovir (ZOVIRAX) 400 MG tablet Take 1 tablet (400 mg total) by mouth 2 (two) times daily. 180 tablet 1   albuterol (VENTOLIN HFA) 108 (90 Base) MCG/ACT inhaler Inhale 1-2 puffs into the lungs every 6 (six) hours as needed for wheezing or shortness of breath. 18 g 3   Ascorbic  Acid (VITAMIN C) 1000 MG tablet Take 1,000 mg by mouth at bedtime.      aspirin EC 81 MG tablet Take 81 mg by mouth daily.     budesonide-formoterol (SYMBICORT) 160-4.5 MCG/ACT inhaler Inhale 2 puffs into the lungs 2 (two) times daily. 3 each 3   cholecalciferol (VITAMIN D) 1000 UNITS tablet Take 1,000 Units by mouth daily.      cyclobenzaprine (FLEXERIL) 10 MG tablet Take 1 tablet by mouth three times daily as needed for muscle spasm 30 tablet 0   famotidine (PEPCID) 20 MG tablet Take 1 tablet (20 mg total) by mouth 2 (two) times daily. 180 tablet 1   fluticasone (FLONASE) 50 MCG/ACT nasal spray Place 1 spray into both nostrils 2 (two) times daily. 48 g 12   guaiFENesin (MUCINEX) 600 MG 12 hr tablet Take 1 tablet (600 mg total) by mouth 2 (two) times daily as needed for cough or to loosen phlegm. 30 tablet 0   lisinopril (ZESTRIL) 20 MG tablet Take 1 tablet (20 mg total) by mouth daily. 90 tablet 1   lubiprostone (AMITIZA) 8 MCG capsule Take 1 capsule (8 mcg total) by mouth 2 (two) times daily with a meal. 180 capsule 1   methocarbamol (ROBAXIN) 500 MG tablet Take 500 mg by mouth daily as needed.     montelukast (SINGULAIR) 10 MG tablet Take 1 tablet (10 mg total) by mouth at bedtime. 90 tablet 1   Multiple Vitamins-Minerals (MULTIVITAMIN WITH MINERALS) tablet Take 1 tablet by mouth daily.     naproxen sodium (ALEVE) 220 MG tablet Take 220 mg by mouth daily as needed.     nystatin cream (MYCOSTATIN) Apply 1 application topically 2 (two) times daily. 30 g 3   omeprazole (PRILOSEC) 40 MG capsule Take 1 capsule (40 mg total) by mouth daily. 90 capsule 1   tadalafil (CIALIS) 5 MG tablet TAKE 1 TABLET BY MOUTH ONCE DAILY AS NEEDED FOR BPH 90 tablet 3   triamcinolone ointment (KENALOG) 0.1 % Apply 1 application topically 2 (two) times daily.     No facility-administered medications prior to visit.    No Known Allergies  Review of Systems  Constitutional:  Positive for fatigue. Negative for fever.   HENT:  Positive for congestion, ear pain, postnasal drip and rhinorrhea. Negative for sore throat.   Respiratory: Negative.    Cardiovascular: Negative.   Neurological:  Positive for dizziness and headaches.      Objective:    Physical Exam Vitals and nursing note reviewed.  Constitutional:      Appearance: Normal appearance.  HENT:     Head: Normocephalic.     Right Ear: Ear canal and external ear normal. A middle  ear effusion is present. Tympanic membrane is not erythematous.     Left Ear: Tympanic membrane, ear canal and external ear normal.  Eyes:     Conjunctiva/sclera: Conjunctivae normal.  Cardiovascular:     Rate and Rhythm: Normal rate and regular rhythm.     Pulses: Normal pulses.     Heart sounds: Normal heart sounds.  Pulmonary:     Effort: Pulmonary effort is normal.     Breath sounds: Normal breath sounds.  Musculoskeletal:     Cervical back: Normal range of motion.  Skin:    General: Skin is warm.  Neurological:     General: No focal deficit present.     Mental Status: He is alert and oriented to person, place, and time.  Psychiatric:        Mood and Affect: Mood normal.        Behavior: Behavior normal.        Thought Content: Thought content normal.        Judgment: Judgment normal.    BP 127/87 (BP Location: Left Arm, Patient Position: Sitting)    Pulse 87    Temp 98.2 F (36.8 C) (Oral)    Resp 18    SpO2 96%  Wt Readings from Last 3 Encounters:  08/03/21 274 lb 9.6 oz (124.6 kg)  06/19/21 270 lb (122.5 kg)  05/21/21 270 lb (122.5 kg)    Health Maintenance Due  Topic Date Due   COVID-19 Vaccine (6 - Booster) 06/18/2021    There are no preventive care reminders to display for this patient.   Lab Results  Component Value Date   TSH 3.400 10/07/2020   Lab Results  Component Value Date   WBC 5.9 04/14/2021   HGB 16.1 04/14/2021   HCT 46.0 04/14/2021   MCV 89 04/14/2021   PLT 192 04/14/2021   Lab Results  Component Value Date   NA  142 04/14/2021   K 4.3 04/14/2021   CO2 23 04/14/2021   GLUCOSE 120 (H) 04/14/2021   BUN 13 04/14/2021   CREATININE 0.99 04/14/2021   BILITOT 0.4 04/14/2021   ALKPHOS 64 04/14/2021   AST 38 04/14/2021   ALT 46 (H) 04/14/2021   PROT 6.8 04/14/2021   ALBUMIN 4.7 04/14/2021   CALCIUM 9.6 04/14/2021   ANIONGAP 9 01/18/2012   EGFR 86 04/14/2021   Lab Results  Component Value Date   CHOL 159 04/14/2021   Lab Results  Component Value Date   HDL 45 04/14/2021   Lab Results  Component Value Date   LDLCALC 97 04/14/2021   Lab Results  Component Value Date   TRIG 89 04/14/2021   No results found for: CHOLHDL Lab Results  Component Value Date   HGBA1C 5.8 04/21/2018       Assessment & Plan:   Problem List Items Addressed This Visit   None Visit Diagnoses     Dysfunction of right eustachian tube    -  Primary   Continue singuliar, flonase. Will send in prednsione taper to help symptoms. F/U if not improving   Acute non-recurrent maxillary sinusitis       Will treat with augmentin. Encourage fluids, rest. Continue OTC medications. F/U if not improving.    Relevant Medications   predniSONE (DELTASONE) 10 MG tablet   amoxicillin-clavulanate (AUGMENTIN) 875-125 MG tablet        Meds ordered this encounter  Medications   predniSONE (DELTASONE) 10 MG tablet    Sig: Take 6 tablets today,  then 5 tablets tomorrow, then decrease by 1 tablet every day until gone    Dispense:  21 tablet    Refill:  0   amoxicillin-clavulanate (AUGMENTIN) 875-125 MG tablet    Sig: Take 1 tablet by mouth 2 (two) times daily.    Dispense:  14 tablet    Refill:  0     Charyl Dancer, NP

## 2021-08-19 ENCOUNTER — Telehealth: Payer: Self-pay

## 2021-08-25 NOTE — Telephone Encounter (Signed)
erro  neous encounter

## 2021-09-06 ENCOUNTER — Other Ambulatory Visit: Payer: Self-pay | Admitting: Family Medicine

## 2021-09-06 NOTE — Telephone Encounter (Signed)
Requested Prescriptions  Pending Prescriptions Disp Refills   montelukast (SINGULAIR) 10 MG tablet [Pharmacy Med Name: Montelukast Sodium 10 MG Oral Tablet] 90 tablet 0    Sig: TAKE 1 TABLET BY MOUTH AT BEDTIME     Pulmonology:  Leukotriene Inhibitors Passed - 09/06/2021  6:30 AM      Passed - Valid encounter within last 12 months    Recent Outpatient Visits          2 weeks ago Dysfunction of right eustachian tube   [facility], Lauren A, NP   1 month ago Post-COVID chronic cough   [facility] [place], Megan P, DO   1 month ago Upper respiratory tract infection, unspecified type   [facility], Megan P, DO   2 months ago Diarrhea, unspecified type   [place] [facility], Avanti, MD   3 months ago Need for influenza vaccination   [facility], Avanti, MD      Future Appointments            In 4 weeks McGowan, Hunt Oris, PA-C [place]   In 1 month Johnson, Barb Merino, DO [place], [place]   In 9 months  [employer], [facility]

## 2021-09-08 ENCOUNTER — Telehealth: Payer: Self-pay | Admitting: Family Medicine

## 2021-09-08 NOTE — Telephone Encounter (Signed)
Please coordination complete of PAP with patient

## 2021-09-08 NOTE — Telephone Encounter (Signed)
Please advise 

## 2021-09-08 NOTE — Telephone Encounter (Signed)
Copied from [employer] [id number]. Topic: Appointment Scheduling - Scheduling Inquiry for Clinic >> Sep 08, 2021  9:01 AM Oneta Rack wrote: Reason for CRM: Patient currently filling out a Information form for cialis which goes to [facility], form is requesting PCP e mail. Unable to give caller PCP e mail, patient would like nurse to fill out form on his behalf, patient states nurse has filled out before. The only difference now patient does make a little more money.

## 2021-09-17 ENCOUNTER — Telehealth: Payer: Self-pay

## 2021-09-17 NOTE — Chronic Care Management (AMB) (Signed)
° ° °  Chronic Care Management Pharmacy Assistant   Name: Derrick Sandoval.  MRN: [id number] DOB: [birth year]   Reason for Encounter: Patient Assistance application for Cialis 5 mg    Medications: Outpatient Encounter Medications as of 09/17/2021  Medication Sig   acyclovir (ZOVIRAX) 400 MG tablet Take 1 tablet (400 mg total) by mouth 2 (two) times daily.   albuterol (VENTOLIN HFA) 108 (90 Base) MCG/ACT inhaler Inhale 1-2 puffs into the lungs every 6 (six) hours as needed for wheezing or shortness of breath.   amoxicillin-clavulanate (AUGMENTIN) 875-125 MG tablet Take 1 tablet by mouth 2 (two) times daily.   Ascorbic Acid (VITAMIN C) 1000 MG tablet Take 1,000 mg by mouth at bedtime.    aspirin EC 81 MG tablet Take 81 mg by mouth daily.   budesonide-formoterol (SYMBICORT) 160-4.5 MCG/ACT inhaler Inhale 2 puffs into the lungs 2 (two) times daily.   cholecalciferol (VITAMIN D) 1000 UNITS tablet Take 1,000 Units by mouth daily.    cyclobenzaprine (FLEXERIL) 10 MG tablet Take 1 tablet by mouth three times daily as needed for muscle spasm   famotidine (PEPCID) 20 MG tablet Take 1 tablet (20 mg total) by mouth 2 (two) times daily.   fluticasone (FLONASE) 50 MCG/ACT nasal spray Place 1 spray into both nostrils 2 (two) times daily.   guaiFENesin (MUCINEX) 600 MG 12 hr tablet Take 1 tablet (600 mg total) by mouth 2 (two) times daily as needed for cough or to loosen phlegm.   lisinopril (ZESTRIL) 20 MG tablet Take 1 tablet (20 mg total) by mouth daily.   lubiprostone (AMITIZA) 8 MCG capsule Take 1 capsule (8 mcg total) by mouth 2 (two) times daily with a meal.   methocarbamol (ROBAXIN) 500 MG tablet Take 500 mg by mouth daily as needed.   montelukast (SINGULAIR) 10 MG tablet TAKE 1 TABLET BY MOUTH AT BEDTIME   Multiple Vitamins-Minerals (MULTIVITAMIN WITH MINERALS) tablet Take 1 tablet by mouth daily.   naproxen sodium (ALEVE) 220 MG tablet Take 220 mg by mouth daily as needed.   nystatin cream  (MYCOSTATIN) Apply 1 application topically 2 (two) times daily.   omeprazole (PRILOSEC) 40 MG capsule Take 1 capsule (40 mg total) by mouth daily.   predniSONE (DELTASONE) 10 MG tablet Take 6 tablets today, then 5 tablets tomorrow, then decrease by 1 tablet every day until gone   tadalafil (CIALIS) 5 MG tablet TAKE 1 TABLET BY MOUTH ONCE DAILY AS NEEDED FOR BPH   triamcinolone ointment (KENALOG) 0.1 % Apply 1 application topically 2 (two) times daily.   No facility-administered encounter medications on file as of 09/17/2021.   I have attempted to reach out to the patient to give directions on the form to fill out and sign. I highlighted the areas of the form and will mail it out. I wrote specific directions for the patient and left my contact information if he needs any help.   Corrie Mckusick, [place]

## 2021-09-24 ENCOUNTER — Encounter: Payer: Self-pay | Admitting: Family Medicine

## 2021-09-24 ENCOUNTER — Telehealth: Payer: Self-pay | Admitting: Family Medicine

## 2021-09-24 ENCOUNTER — Ambulatory Visit (INDEPENDENT_AMBULATORY_CARE_PROVIDER_SITE_OTHER): Payer: PPO | Admitting: Family Medicine

## 2021-09-24 ENCOUNTER — Other Ambulatory Visit: Payer: Self-pay

## 2021-09-24 VITALS — BP 134/99 | HR 76 | Temp 98.2°F | Ht 67.99 in | Wt 277.8 lb

## 2021-09-24 DIAGNOSIS — R0981 Nasal congestion: Secondary | ICD-10-CM

## 2021-09-24 DIAGNOSIS — J329 Chronic sinusitis, unspecified: Secondary | ICD-10-CM | POA: Diagnosis not present

## 2021-09-24 DIAGNOSIS — J020 Streptococcal pharyngitis: Secondary | ICD-10-CM | POA: Diagnosis not present

## 2021-09-24 LAB — RAPID STREP SCREEN (MED CTR MEBANE ONLY): Strep Gp A Ag, IA W/Reflex: POSITIVE — AB

## 2021-09-24 LAB — VERITOR FLU A/B WAIVED
Influenza A: NEGATIVE
Influenza B: NEGATIVE

## 2021-09-24 MED ORDER — DOXYCYCLINE HYCLATE 100 MG PO TABS: 100.0000 mg | ORAL_TABLET | Freq: Two times a day (BID) | ORAL | 0 refills | Status: DC

## 2021-09-24 MED ORDER — CETIRIZINE HCL 10 MG PO TABS: 10.0000 mg | ORAL_TABLET | Freq: Every day | ORAL | 3 refills | Status: DC

## 2021-09-24 MED ORDER — AMOXICILLIN-POT CLAVULANATE 875-125 MG PO TABS: 1.0000 | ORAL_TABLET | Freq: Two times a day (BID) | ORAL | 0 refills | Status: DC

## 2021-09-24 NOTE — Assessment & Plan Note (Signed)
+   strep.

## 2021-09-24 NOTE — Assessment & Plan Note (Signed)
[facility]. Has not seen his ENT since 2019. Will get him back in to see him. Referral generated today.

## 2021-09-24 NOTE — Progress Notes (Signed)
BP (!) 134/99    Pulse 76    Temp 98.2 F (36.8 C) (Oral)    Ht 5' 7.99" (1.727 m)    Wt 277 lb 12.8 oz (126 kg)    SpO2 96%    BMI 42.25 kg/m    Subjective:    Patient ID: Derrick Ball., male    DOB: [birth year], 66 y.o.   MRN: [id number]  HPI: Derrick Faith. is a 66 y.o. male  Chief Complaint  Patient presents with   Facial Pain   SINUS PRESSURE   Cough   SINUS DRAINAGE   Ear Pain    B/L ear pain  All has been gong on for the past 2 weeks.    Fatigue   UPPER RESPIRATORY TRACT INFECTION Duration: 2 weeks Worst symptom: facial pain and congestion Fever: no Cough: yes Shortness of breath: no Wheezing: no Chest pain: no Chest tightness: no Chest congestion: yes Nasal congestion: yes Runny nose: yes Post nasal drip: yes Sneezing: yes Sore throat: no Swollen glands: yes Sinus pressure: yes Headache: yes Face pain: yes Toothache: yes Ear pain: yes bilateral Ear pressure: yes left Eyes red/itching:no Eye drainage/crusting: no  Vomiting: no Rash: no Fatigue: yes Sick contacts: no Strep contacts: no  Context: worse Recurrent sinusitis: yes Relief with OTC cold/cough medications: no  Treatments attempted: flonase, singulair, mucinex, sinus rinse,     Relevant past medical, surgical, family and social history reviewed and updated as indicated. Interim medical history since our last visit reviewed. Allergies and medications reviewed and updated.  Review of Systems  Constitutional:  Positive for fatigue. Negative for activity change, chills, diaphoresis, fever and unexpected weight change.  HENT:  Positive for congestion, postnasal drip, rhinorrhea, sinus pressure, sinus pain, sneezing and sore throat. Negative for dental problem, drooling, ear discharge, ear pain, facial swelling, hearing loss, mouth sores, nosebleeds, tinnitus, trouble swallowing and voice change.   Respiratory: Negative.    Cardiovascular: Negative.   Gastrointestinal: Negative.    Psychiatric/Behavioral: Negative.     Per HPI unless specifically indicated above     Objective:    BP (!) 134/99    Pulse 76    Temp 98.2 F (36.8 C) (Oral)    Ht 5' 7.99" (1.727 m)    Wt 277 lb 12.8 oz (126 kg)    SpO2 96%    BMI 42.25 kg/m   Wt Readings from Last 3 Encounters:  09/24/21 277 lb 12.8 oz (126 kg)  08/03/21 274 lb 9.6 oz (124.6 kg)  06/19/21 270 lb (122.5 kg)    Physical Exam Vitals and nursing note reviewed.  Constitutional:      General: He is not in acute distress.    Appearance: Normal appearance. He is obese. He is not ill-appearing, toxic-appearing or diaphoretic.  HENT:     Head: Normocephalic and atraumatic.     Right Ear: Tympanic membrane, ear canal and external ear normal.     Left Ear: Tympanic membrane, ear canal and external ear normal.     Nose: Congestion and rhinorrhea present.     Mouth/Throat:     Mouth: Mucous membranes are moist.     Pharynx: Oropharynx is clear. No oropharyngeal exudate or posterior oropharyngeal erythema.  Eyes:     General: No scleral icterus.       Right eye: No discharge.        Left eye: No discharge.     Extraocular Movements: Extraocular movements intact.  Conjunctiva/sclera: Conjunctivae normal.     Pupils: Pupils are equal, round, and reactive to light.  Neck:     Vascular: No carotid bruit.  Cardiovascular:     Rate and Rhythm: Normal rate and regular rhythm.     Pulses: Normal pulses.     Heart sounds: Normal heart sounds. No murmur heard.   No friction rub. No gallop.  Pulmonary:     Effort: Pulmonary effort is normal. No respiratory distress.     Breath sounds: Normal breath sounds. No stridor. No wheezing, rhonchi or rales.  Chest:     Chest wall: No tenderness.  Musculoskeletal:        General: Normal range of motion.     Cervical back: Normal range of motion and neck supple. No rigidity or tenderness.  Lymphadenopathy:     Cervical: Cervical adenopathy present.  Skin:    General: Skin is  warm and dry.     Capillary Refill: Capillary refill takes less than 2 seconds.     Coloration: Skin is not jaundiced or pale.     Findings: No bruising, erythema, lesion or rash.  Neurological:     General: No focal deficit present.     Mental Status: He is alert and oriented to person, place, and time. Mental status is at baseline.  Psychiatric:        Mood and Affect: Mood normal.        Behavior: Behavior normal.        Thought Content: Thought content normal.        Judgment: Judgment normal.    Results for orders placed or performed in visit on 07/14/21  Novel Coronavirus, NAA ([facility])   Specimen: Saline  Result Value Ref Range   SARS-CoV-2, NAA Detected (A) Not Detected  SARS-COV-2, NAA 2 DAY TAT  Result Value Ref Range   SARS-CoV-2, NAA 2 DAY TAT Performed   Veritor Flu A/B Waived  Result Value Ref Range   Influenza A Negative Negative   Influenza B Negative Negative      Assessment & Plan:   Problem List Items Addressed This Visit       Respiratory   Recurrent sinusitis    [place]. Has not seen his ENT since 2019. Will get him back in to see him. Referral generated today.      Relevant Medications   cetirizine (ZYRTEC) 10 MG tablet   amoxicillin-clavulanate (AUGMENTIN) 875-125 MG tablet   Other Relevant Orders   Ambulatory referral to ENT     Other   Nasal congestion    + strep.      Relevant Orders   Veritor Flu A/B Waived   Rapid Strep Screen ([facility] ONLY)   Novel Coronavirus, NAA ([facility])   Other Visit Diagnoses     Strep pharyngitis    -  Primary   + strep. Will treat with augmentin. Call if not getting better or getting worse.         Follow up plan: Return if symptoms worsen or fail to improve.

## 2021-09-24 NOTE — Telephone Encounter (Signed)
Patient dropped off [employer] patient assistance form to be completed by provider and needs to be faxed to [phone].  Patient asked to be contacted at [phone] once form is completed.  Placed in provider's folder.

## 2021-09-25 LAB — NOVEL CORONAVIRUS, NAA: SARS-CoV-2, NAA: NOT DETECTED

## 2021-09-25 NOTE — Telephone Encounter (Signed)
Paperwork completed and faxed to [employer]. Called and let patient know that this was done for her.

## 2021-09-29 NOTE — Progress Notes (Signed)
Updated note on panel.  Derrick Sandoval, [name]

## 2021-10-01 ENCOUNTER — Encounter: Payer: Self-pay | Admitting: Family Medicine

## 2021-10-02 NOTE — Progress Notes (Signed)
10/15/21 9:01 AM   Derrick Sandoval. [birth year] [id number]  Referring provider:  Dorcas Carrow, DO [address] [place],  [place] [address]  Chief Complaint  Patient presents with   Follow-up    1 year follow-up   Urological history: 1. Incomplete bladder emptying -He has a personal history of a TURP and urethral stricture.  He is now being followed by [facility] urology where he is undergone urodynamics. He is scheduled to have an InterStim device placed by Dr. Daine Gip on 12/23/2020 and 01/06/2021 -currently cathing three times daily   2. BPH with LU TS -PSA pending -IPSS 15/3  -PVR 152 mL  -managed with tadalafil 5 mg daily and CIC  3. Orchitis and epididymitis -had episode in 2021   4.  Asymptomatic bacteriuria - Likely colonized due to self cathing   HPI: Derrick Sandoval. is a 66 y.o.male who presents today for a 1 year follow-up.  He reports that he has been having lower back pain and believe he may have an infection.     He has no urinary complaints at this time.  UA benign  Patient denies any modifying or aggravating factors.  Patient denies any gross hematuria, dysuria or suprapubic/flank pain.  Patient denies any fevers, chills, nausea or vomiting.     IPSS     Row Name 10/05/21 1000         International Prostate Symptom Score   How often have you had the sensation of not emptying your bladder? Almost always     How often have you had to urinate less than every two hours? Less than 1 in 5 times     How often have you found you stopped and started again several times when you urinated? Less than 1 in 5 times     How often have you found it difficult to postpone urination? Not at All     How often have you had a weak urinary stream? Not at All     How often have you had to strain to start urination? Almost always     How many times did you typically get up at night to urinate? 3 Times     Total IPSS Score 15       Quality of Life due to urinary symptoms   If  you were to spend the rest of your life with your urinary condition just the way it is now how would you feel about that? Mixed              Score:  1-7 Mild 8-19 Moderate 20-35 Severe  Patient still having spontaneous erections.  He denies any pain or curvature with erections.     SHIM     Row Name 10/05/21 1035         SHIM: Over the last 6 months:   How do you rate your confidence that you could get and keep an erection? Very High     When you had erections with sexual stimulation, how often were your erections hard enough for penetration (entering your partner)? Almost Always or Always     During sexual intercourse, how often were you able to maintain your erection after you had penetrated (entered) your partner? Almost Always or Always     During sexual intercourse, how difficult was it to maintain your erection to completion of intercourse? Not Difficult     When you attempted sexual intercourse, how often was it satisfactory for you?  Almost Always or Always       SHIM Total Score   SHIM 25              PMH: Past Medical History:  Diagnosis Date   Allergy    Arthritis    Cervical radiculopathy 01/16/2021   Cough    nonproductive no fever saw at dr 05-26-18   GERD (gastroesophageal reflux disease)    Hyperlipidemia    Hypertension    Meningitis due to viruses 1968    Surgical History: Past Surgical History:  Procedure Laterality Date   COLONOSCOPY WITH PROPOFOL N/A 05/12/2018   Procedure: COLONOSCOPY WITH PROPOFOL;  Surgeon: Wyline Mood, MD;  Location: [facility] ENDOSCOPY;  Service: Gastroenterology;  Laterality: N/A;   ESOPHAGOGASTRODUODENOSCOPY (EGD) WITH PROPOFOL N/A 05/12/2018   Procedure: ESOPHAGOGASTRODUODENOSCOPY (EGD) WITH PROPOFOL;  Surgeon: Wyline Mood, MD;  Location: [facility] ENDOSCOPY;  Service: Gastroenterology;  Laterality: N/A;   ESOPHAGOGASTRODUODENOSCOPY (EGD) WITH PROPOFOL N/A 06/01/2018   Procedure: ESOPHAGOGASTRODUODENOSCOPY (EGD) WITH PROPOFOL;   Surgeon: Rachael Fee, MD;  Location: [facility] ENDOSCOPY;  Service: Endoscopy;  Laterality: N/A;   EUS N/A 06/01/2018   Procedure: UPPER ENDOSCOPIC ULTRASOUND (EUS) RADIAL;  Surgeon: Rachael Fee, MD;  Location: [facility] ENDOSCOPY;  Service: Endoscopy;  Laterality: N/A;   JOINT REPLACEMENT Bilateral    NASAL SINUS SURGERY     TOTAL KNEE ARTHROPLASTY      Home Medications:  Allergies as of 10/05/2021   No Known Allergies      Medication List        Accurate as of October 05, 2021 11:59 PM. If you have any questions, ask your nurse or doctor.          STOP taking these medications    amoxicillin-clavulanate 875-125 MG tablet Commonly known as: AUGMENTIN Stopped by: Michiel Cowboy, PA-C       TAKE these medications    acyclovir 400 MG tablet Commonly known as: ZOVIRAX Take 1 tablet (400 mg total) by mouth 2 (two) times daily.   albuterol 108 (90 Base) MCG/ACT inhaler Commonly known as: VENTOLIN HFA Inhale 1-2 puffs into the lungs every 6 (six) hours as needed for wheezing or shortness of breath.   aspirin EC 81 MG tablet Take 81 mg by mouth daily.   budesonide-formoterol 160-4.5 MCG/ACT inhaler Commonly known as: SYMBICORT Inhale 2 puffs into the lungs 2 (two) times daily.   cetirizine 10 MG tablet Commonly known as: ZYRTEC Take 1 tablet (10 mg total) by mouth daily.   cholecalciferol 1000 units tablet Commonly known as: VITAMIN D Take 1,000 Units by mouth daily.   cyclobenzaprine 10 MG tablet Commonly known as: FLEXERIL Take 1 tablet by mouth three times daily as needed for muscle spasm   famotidine 20 MG tablet Commonly known as: PEPCID Take 1 tablet (20 mg total) by mouth 2 (two) times daily.   fluticasone 50 MCG/ACT nasal spray Commonly known as: FLONASE Place 1 spray into both nostrils 2 (two) times daily.   guaiFENesin 600 MG 12 hr tablet Commonly known as: Mucinex Take 1 tablet (600 mg total) by mouth 2 (two) times daily as needed for cough or  to loosen phlegm.   lisinopril 20 MG tablet Commonly known as: ZESTRIL Take 1 tablet (20 mg total) by mouth daily.   lubiprostone 8 MCG capsule Commonly known as: Amitiza Take 1 capsule (8 mcg total) by mouth 2 (two) times daily with a meal.   methocarbamol 500 MG tablet Commonly known as: ROBAXIN Take 500 mg  by mouth daily as needed.   montelukast 10 MG tablet Commonly known as: SINGULAIR TAKE 1 TABLET BY MOUTH AT BEDTIME   multivitamin with minerals tablet Take 1 tablet by mouth daily.   naproxen sodium 220 MG tablet Commonly known as: ALEVE Take 220 mg by mouth daily as needed.   nystatin cream Commonly known as: MYCOSTATIN Apply 1 application topically 2 (two) times daily.   omeprazole 40 MG capsule Commonly known as: PRILOSEC Take 1 capsule (40 mg total) by mouth daily.   tadalafil 5 MG tablet Commonly known as: CIALIS TAKE 1 TABLET BY MOUTH ONCE DAILY AS NEEDED FOR BPH   triamcinolone ointment 0.1 % Commonly known as: KENALOG Apply 1 application topically 2 (two) times daily.   vitamin C 1000 MG tablet Take 1,000 mg by mouth at bedtime.        Allergies:  No Known Allergies  Family History: Family History  Problem Relation Age of Onset   Breast cancer Mother    Cancer Father        unsure, passed away before it was confirmed   Pancreatic cancer Daughter    Diabetes Neg Hx    Heart disease Neg Hx    Hypertension Neg Hx    Stroke Neg Hx    COPD Neg Hx    Colon cancer Neg Hx     Social History:  reports that he has never smoked. He has never been exposed to tobacco smoke. He has never used smokeless tobacco. He reports that he does not currently use alcohol. He reports that he does not use drugs.   Physical Exam: BP (!) 155/98    Pulse 64    Ht 5\' 8"  (1.727 m)    Wt 283 lb (128.4 kg)    BMI 43.03 kg/m   Constitutional:  Well nourished. Alert and oriented, No acute distress. HEENT: [place] AT, mask in place trachea midline Cardiovascular: No  clubbing, cyanosis, or edema. Respiratory: Normal respiratory effort, no increased work of breathing. GU: No CVA tenderness.  No bladder fullness or masses.  Patient with uncircumcised phallus. Urethral meatus is patent.  No penile discharge. No penile lesions or rashes. Scrotum without lesions, cysts, rashes and/or edema.  Testicles are located scrotally bilaterally. No masses are appreciated in the testicles. Left and right epididymis are normal. Rectal: Patient with  normal sphincter tone. Anus and perineum without scarring or rashes. No rectal masses are appreciated. Prostate is approximately 40 grams, no nodules are appreciated. Seminal vesicles could not be palpated Neurologic: Grossly intact, no focal deficits, moving all 4 extremities. Psychiatric: Normal mood and affect.   Laboratory Data: Lab Results  Component Value Date   CREATININE 0.88 10/13/2021   Component     Latest Ref Rng & Units 04/14/2021  Glucose     65 - 99 mg/dL 629 (H)  BUN     8 - 27 mg/dL 13  Creatinine     5.28 - 1.27 mg/dL 4.13  eGFR     >24 MW/NUU/7.25 86  BUN/Creatinine Ratio     10 - 24 13  Sodium     134 - 144 mmol/L 142  Potassium     3.5 - 5.2 mmol/L 4.3  Chloride     96 - 106 mmol/L 105  CO2     20 - 29 mmol/L 23  Calcium     8.6 - 10.2 mg/dL 9.6  Total Protein     6.0 - 8.5 g/dL 6.8  Albumin  3.8 - 4.8 g/dL 4.7  Globulin, Total     1.5 - 4.5 g/dL 2.1  Albumin/Globulin Ratio     1.2 - 2.2 2.2  Total Bilirubin     0.0 - 1.2 mg/dL 0.4  Alkaline Phosphatase     44 - 121 IU/L 64  AST     0 - 40 IU/L 38  ALT     0 - 44 IU/L 46 (H)   Component     Latest Ref Rng & Units 04/14/2021  Cholesterol, Total     100 - 199 mg/dL 130  Triglycerides     0 - 149 mg/dL 89  HDL Cholesterol     >39 mg/dL 45  VLDL Cholesterol Cal     5 - 40 mg/dL 17  LDL Chol Calc (NIH)     0 - 99 mg/dL 97    Results for orders placed or performed during the hospital encounter of 10/05/21  PSA  Result  Value Ref Range   Prostatic Specific Antigen 0.09 0.00 - 4.00 ng/mL  Urinalysis, Complete w Microscopic  Result Value Ref Range   Color, Urine YELLOW YELLOW   APPearance CLEAR CLEAR   Specific Gravity, Urine 1.015 1.005 - 1.030   pH 5.5 5.0 - 8.0   Glucose, UA NEGATIVE NEGATIVE mg/dL   Hgb urine dipstick TRACE (A) NEGATIVE   Bilirubin Urine NEGATIVE NEGATIVE   Ketones, ur NEGATIVE NEGATIVE mg/dL   Protein, ur NEGATIVE NEGATIVE mg/dL   Nitrite NEGATIVE NEGATIVE   Leukocytes,Ua NEGATIVE NEGATIVE   Squamous Epithelial / LPF 0-5 0 - 5   WBC, UA 0-5 0 - 5 WBC/hpf   RBC / HPF 0-5 0 - 5 RBC/hpf   Bacteria, UA NONE SEEN NONE SEEN  Results for orders placed or performed in visit on 10/05/21  BLADDER SCAN AMB NON-IMAGING  Result Value Ref Range   Scan Result   I have reviewed the labs.   Assessment & Plan:    1. Incomplete bladder emptying - Managing with self cath x3 daily  - He was evaluated at [facility] urology and they recommended Interstim placement he has deferred that I light of recent surgeries. He is satisfied with cathing.  - PVR 152 ml  2. Lower back pain  - Most likely musculoskeletal in origin  - UA benign  3. BPH with LUTS -PSA stable -DRE benign -UA benign -PVR < 300 cc -continue conservative management, avoiding bladder irritants and timed voiding's -Continue Cialis 5 mg daily    Return in about 1 year (around 10/05/2022) for IPSS, SHIM, PSA and exam.  [facility] [address], [address] [place], [place] [address] [phone]  I,Lui Bellis,acting as a scribe for [facility], PA-C.,have documented all relevant documentation on the behalf of Zionna Homewood, PA-C,as directed by  [facility], PA-C while in the presence of Asa Baudoin, PA-C.

## 2021-10-05 ENCOUNTER — Other Ambulatory Visit: Payer: Self-pay

## 2021-10-05 ENCOUNTER — Encounter: Payer: Self-pay | Admitting: Urology

## 2021-10-05 ENCOUNTER — Ambulatory Visit (INDEPENDENT_AMBULATORY_CARE_PROVIDER_SITE_OTHER): Payer: PPO | Admitting: Urology

## 2021-10-05 ENCOUNTER — Other Ambulatory Visit
Admission: RE | Admit: 2021-10-05 | Discharge: 2021-10-05 | Disposition: A | Discharge status: Home / Self Care | Payer: PPO | Attending: Urology | Admitting: Urology

## 2021-10-05 ENCOUNTER — Other Ambulatory Visit: Payer: Self-pay | Admitting: *Deleted

## 2021-10-05 VITALS — BP 155/98 | HR 64 | Ht 68.0 in | Wt 283.0 lb

## 2021-10-05 DIAGNOSIS — N138 Other obstructive and reflux uropathy: Secondary | ICD-10-CM | POA: Diagnosis not present

## 2021-10-05 DIAGNOSIS — G8929 Other chronic pain: Secondary | ICD-10-CM | POA: Diagnosis not present

## 2021-10-05 DIAGNOSIS — N453 Epididymo-orchitis: Secondary | ICD-10-CM

## 2021-10-05 DIAGNOSIS — Z125 Encounter for screening for malignant neoplasm of prostate: Secondary | ICD-10-CM | POA: Insufficient documentation

## 2021-10-05 DIAGNOSIS — N401 Enlarged prostate with lower urinary tract symptoms: Secondary | ICD-10-CM

## 2021-10-05 DIAGNOSIS — M545 Low back pain, unspecified: Secondary | ICD-10-CM | POA: Diagnosis not present

## 2021-10-05 DIAGNOSIS — R339 Retention of urine, unspecified: Secondary | ICD-10-CM

## 2021-10-05 LAB — URINALYSIS, COMPLETE (UACMP) WITH MICROSCOPIC
Bacteria, UA: NONE SEEN
Bilirubin Urine: NEGATIVE
Glucose, UA: NEGATIVE mg/dL
Ketones, ur: NEGATIVE mg/dL
Leukocytes,Ua: NEGATIVE
Nitrite: NEGATIVE
Protein, ur: NEGATIVE mg/dL
Specific Gravity, Urine: 1.015 (ref 1.005–1.030)
pH: 5.5 (ref 5.0–8.0)

## 2021-10-05 LAB — BLADDER SCAN AMB NON-IMAGING

## 2021-10-05 LAB — PSA: Prostatic Specific Antigen: 0.09 ng/mL (ref 0.00–4.00)

## 2021-10-13 ENCOUNTER — Encounter: Payer: Self-pay | Admitting: Family Medicine

## 2021-10-13 ENCOUNTER — Ambulatory Visit (INDEPENDENT_AMBULATORY_CARE_PROVIDER_SITE_OTHER): Payer: PPO | Admitting: Family Medicine

## 2021-10-13 ENCOUNTER — Other Ambulatory Visit: Payer: Self-pay

## 2021-10-13 VITALS — BP 147/79 | HR 64 | Temp 97.7°F | Ht 67.5 in | Wt 282.8 lb

## 2021-10-13 DIAGNOSIS — N401 Enlarged prostate with lower urinary tract symptoms: Secondary | ICD-10-CM | POA: Diagnosis not present

## 2021-10-13 DIAGNOSIS — K760 Fatty (change of) liver, not elsewhere classified: Secondary | ICD-10-CM | POA: Diagnosis not present

## 2021-10-13 DIAGNOSIS — J309 Allergic rhinitis, unspecified: Secondary | ICD-10-CM

## 2021-10-13 DIAGNOSIS — Z0000 Encounter for general adult medical examination without abnormal findings: Secondary | ICD-10-CM | POA: Diagnosis not present

## 2021-10-13 DIAGNOSIS — I10 Essential (primary) hypertension: Secondary | ICD-10-CM

## 2021-10-13 DIAGNOSIS — K219 Gastro-esophageal reflux disease without esophagitis: Secondary | ICD-10-CM | POA: Diagnosis not present

## 2021-10-13 DIAGNOSIS — E559 Vitamin D deficiency, unspecified: Secondary | ICD-10-CM

## 2021-10-13 DIAGNOSIS — R338 Other retention of urine: Secondary | ICD-10-CM

## 2021-10-13 DIAGNOSIS — E782 Mixed hyperlipidemia: Secondary | ICD-10-CM

## 2021-10-13 DIAGNOSIS — R454 Irritability and anger: Secondary | ICD-10-CM | POA: Insufficient documentation

## 2021-10-13 LAB — MICROALBUMIN, URINE WAIVED
Creatinine, Urine Waived: 200 mg/dL (ref 10–300)
Microalb, Ur Waived: 80 mg/L — ABNORMAL HIGH (ref 0–19)
Microalb/Creat Ratio: <30 mg/g (ref <30)

## 2021-10-13 LAB — MICROSCOPIC EXAMINATION
Epithelial Cells (non renal): NONE SEEN /hpf (ref 0–10)
RBC, Urine: NONE SEEN /hpf (ref 0–2)

## 2021-10-13 LAB — URINALYSIS, ROUTINE W REFLEX MICROSCOPIC
Bilirubin, UA: NEGATIVE
Glucose, UA: NEGATIVE
Ketones, UA: NEGATIVE
Nitrite, UA: NEGATIVE
Protein,UA: NEGATIVE
RBC, UA: NEGATIVE
Specific Gravity, UA: 1.025 (ref 1.005–1.030)
Urobilinogen, Ur: 0.2 mg/dL (ref 0.2–1.0)
pH, UA: 5.5 (ref 5.0–7.5)

## 2021-10-13 MED ORDER — MONTELUKAST SODIUM 10 MG PO TABS: 10.0000 mg | ORAL_TABLET | Freq: Every day | ORAL | 1 refills | Status: DC

## 2021-10-13 MED ORDER — FLUTICASONE PROPIONATE 50 MCG/ACT NA SUSP: 1.0000 | Freq: Two times a day (BID) | NASAL | 12 refills | Status: DC

## 2021-10-13 MED ORDER — BUDESONIDE-FORMOTEROL FUMARATE 160-4.5 MCG/ACT IN AERO: 2.0000 | INHALATION_SPRAY | Freq: Two times a day (BID) | RESPIRATORY_TRACT | 3 refills | Status: DC

## 2021-10-13 MED ORDER — CETIRIZINE HCL 10 MG PO TABS: 10.0000 mg | ORAL_TABLET | Freq: Every day | ORAL | 3 refills | Status: DC

## 2021-10-13 MED ORDER — TADALAFIL 5 MG PO TABS: ORAL_TABLET | ORAL | 3 refills | Status: DC

## 2021-10-13 MED ORDER — LISINOPRIL 20 MG PO TABS: 20.0000 mg | ORAL_TABLET | Freq: Every day | ORAL | 1 refills | Status: DC

## 2021-10-13 MED ORDER — LUBIPROSTONE 8 MCG PO CAPS: 8.0000 ug | ORAL_CAPSULE | Freq: Two times a day (BID) | ORAL | 1 refills | Status: DC

## 2021-10-13 MED ORDER — FAMOTIDINE 20 MG PO TABS: 20.0000 mg | ORAL_TABLET | Freq: Two times a day (BID) | ORAL | 1 refills | Status: DC

## 2021-10-13 MED ORDER — CYCLOBENZAPRINE HCL 10 MG PO TABS: 10.0000 mg | ORAL_TABLET | Freq: Three times a day (TID) | ORAL | 3 refills | Status: DC | PRN

## 2021-10-13 MED ORDER — OMEPRAZOLE 40 MG PO CPDR: 40.0000 mg | DELAYED_RELEASE_CAPSULE | Freq: Every day | ORAL | 1 refills | Status: DC

## 2021-10-13 MED ORDER — ALBUTEROL SULFATE HFA 108 (90 BASE) MCG/ACT IN AERS: 1.0000 | INHALATION_SPRAY | Freq: Four times a day (QID) | RESPIRATORY_TRACT | 3 refills | Status: DC | PRN

## 2021-10-13 MED ORDER — SERTRALINE HCL 50 MG PO TABS: 50.0000 mg | ORAL_TABLET | Freq: Every day | ORAL | 3 refills | Status: DC

## 2021-10-13 MED ORDER — TRIAMCINOLONE ACETONIDE 40 MG/ML IJ SUSP
40.0000 mg | Freq: Once | INTRAMUSCULAR | Status: AC
  Administered 2021-10-13: 40 mg via INTRAMUSCULAR

## 2021-10-13 MED ORDER — ACYCLOVIR 400 MG PO TABS
400.0000 mg | ORAL_TABLET | Freq: Two times a day (BID) | ORAL | 1 refills | Status: DC
Start: 2021-10-13 — End: 2022-04-09

## 2021-10-13 NOTE — Assessment & Plan Note (Signed)
Under good control on current regimen. Continue current regimen. Continue to monitor. Call with any concerns. Refills given. Labs drawn today.   

## 2021-10-13 NOTE — Assessment & Plan Note (Signed)
Encouraged diet and exercise. Call with any concerns. Continue to monitor.  

## 2021-10-13 NOTE — Assessment & Plan Note (Signed)
Under good control on current regimen. Continue current regimen. Continue to monitor. Call with any concerns. Refills given. Continue to follow with urology.

## 2021-10-13 NOTE — Assessment & Plan Note (Signed)
Will start low dose sertraline and recheck 1 month. Call with any concerns.

## 2021-10-13 NOTE — Assessment & Plan Note (Signed)
Running a little high today, but very irritable. Will start treatment for irritability and recheck 1 month. Refills given today.

## 2021-10-13 NOTE — Assessment & Plan Note (Signed)
Encouraged diet and exercise with the goal of losing 1-2lbs per week. Call with any concerns.

## 2021-10-13 NOTE — Assessment & Plan Note (Signed)
Acting up. Will give triamcinalone shot today. Continue allergy medicines. Await appointment with ENT. Call with any concerns.

## 2021-10-13 NOTE — Assessment & Plan Note (Signed)
Rechecking labs today. Await results. Treat as needed.  °

## 2021-10-13 NOTE — Progress Notes (Signed)
BP (!) 147/79 (BP Location: Left Arm, Cuff Size: Normal)    Pulse 64    Temp 97.7 F (36.5 C) (Oral)    Ht 5' 7.5" (1.715 m)    Wt 282 lb 12.8 oz (128.3 kg)    SpO2 97%    BMI 43.64 kg/m    Subjective:    Patient ID: Derrick Sandoval., male    DOB: September 29, 1955, 66 y.o.   MRN: [id number]  HPI: Derrick Sandoval. is a 66 y.o. male presenting on 10/13/2021 for comprehensive medical examination. Current medical complaints include:  HYPERTENSION / HYPERLIPIDEMIA Satisfied with current treatment? yes Duration of hypertension: chronic BP monitoring frequency: not checking BP medication side effects: no Duration of hyperlipidemia: chronic Cholesterol medication side effects: no Cholesterol supplements: none Past cholesterol medications: none Medication compliance: excellent compliance Aspirin: yes Recent stressors: no Recurrent headaches: no Visual changes: no Palpitations: no Dyspnea: no Chest pain: no Lower extremity edema: no Dizzy/lightheaded: no  ANXIETY/STRESS Duration: couple of months Status:exacerbated Anxious mood: yes  Excessive worrying: no Irritability: yes  Sweating: no Nausea: no Palpitations:no Hyperventilation: no Panic attacks: no Agoraphobia: no  Obscessions/compulsions: no Depressed mood: no Depression screen [facility] 2/9 10/13/2021 09/24/2021 08/03/2021 06/19/2021 05/21/2021  Decreased Interest 3 3 0 0 3  Down, Depressed, Hopeless 3 0 0 0 0  PHQ - 2 Score 6 3 0 0 3  Altered sleeping 3 3 3  - 3  Tired, decreased energy 3 1 3  - 3  Change in appetite 3 0 1 - 2  Feeling bad or failure about yourself  0 0 0 - 0  Trouble concentrating 0 0 0 - 0  Moving slowly or fidgety/restless 0 0 0 - 0  Suicidal thoughts 0 0 0 - 0  PHQ-9 Score 15 7 7  - 11  Difficult doing work/chores Somewhat difficult Somewhat difficult - - Somewhat difficult  Some recent data might be hidden   Anhedonia: no Weight changes: no Insomnia: no   Hypersomnia: no Fatigue/loss of energy:  no Feelings of worthlessness: no Feelings of guilt: no Impaired concentration/indecisiveness: no Suicidal ideations: no  Crying spells: no Recent Stressors/Life Changes: no   Relationship problems: no   Family stress: no     Financial stress: no    Job stress: no    Recent death/loss: no   Interim Problems from his last visit: no  Depression Screen done today and results listed below:  Depression screen [facility] 2/9 10/13/2021 09/24/2021 08/03/2021 06/19/2021 05/21/2021  Decreased Interest 3 3 0 0 3  Down, Depressed, Hopeless 3 0 0 0 0  PHQ - 2 Score 6 3 0 0 3  Altered sleeping 3 3 3  - 3  Tired, decreased energy 3 1 3  - 3  Change in appetite 3 0 1 - 2  Feeling bad or failure about yourself  0 0 0 - 0  Trouble concentrating 0 0 0 - 0  Moving slowly or fidgety/restless 0 0 0 - 0  Suicidal thoughts 0 0 0 - 0  PHQ-9 Score 15 7 7  - 11  Difficult doing work/chores Somewhat difficult Somewhat difficult - - Somewhat difficult  Some recent data might be hidden   Past Medical History:  Past Medical History:  Diagnosis Date   Allergy    Arthritis    Cervical radiculopathy 01/16/2021   Cough    nonproductive no fever saw at dr 05-26-18   GERD (gastroesophageal reflux disease)    Hyperlipidemia    Hypertension  Meningitis due to viruses 1968    Surgical History:  Past Surgical History:  Procedure Laterality Date   COLONOSCOPY WITH PROPOFOL N/A 05/12/2018   Procedure: COLONOSCOPY WITH PROPOFOL;  Surgeon: Jonathon Bellows, MD;  Location[facility] ENDOSCOPY;  Service: Gastroenterology;  Laterality: N/A;   ESOPHAGOGASTRODUODENOSCOPY (EGD) WITH PROPOFOL N/A 05/12/2018   Procedure: ESOPHAGOGASTRODUODENOSCOPY (EGD) WITH PROPOFOL;  Surgeon: Jonathon Bellows, MD;  Location: [facility] ENDOSCOPY;  Service: Gastroenterology;  Laterality: N/A;   ESOPHAGOGASTRODUODENOSCOPY (EGD) WITH PROPOFOL N/A 06/01/2018   Procedure: ESOPHAGOGASTRODUODENOSCOPY (EGD) WITH PROPOFOL;  Surgeon: Milus Banister, MD;  Location: [facility]  ENDOSCOPY;  Service: Endoscopy;  Laterality: N/A;   EUS N/A 06/01/2018   Procedure: UPPER ENDOSCOPIC ULTRASOUND (EUS) RADIAL;  Surgeon: Milus Banister, MD;  Location: [facility] ENDOSCOPY;  Service: Endoscopy;  Laterality: N/A;   JOINT REPLACEMENT Bilateral    NASAL SINUS SURGERY     TOTAL KNEE ARTHROPLASTY      Medications:  Current Outpatient Medications on File Prior to Visit  Medication Sig   Ascorbic Acid (VITAMIN C) 1000 MG tablet Take 1,000 mg by mouth at bedtime.    aspirin EC 81 MG tablet Take 81 mg by mouth daily.   cholecalciferol (VITAMIN D) 1000 UNITS tablet Take 1,000 Units by mouth daily.    guaiFENesin (MUCINEX) 600 MG 12 hr tablet Take 1 tablet (600 mg total) by mouth 2 (two) times daily as needed for cough or to loosen phlegm.   methocarbamol (ROBAXIN) 500 MG tablet Take 500 mg by mouth daily as needed.   Multiple Vitamins-Minerals (MULTIVITAMIN WITH MINERALS) tablet Take 1 tablet by mouth daily.   naproxen sodium (ALEVE) 220 MG tablet Take 220 mg by mouth daily as needed.   nystatin cream (MYCOSTATIN) Apply 1 application topically 2 (two) times daily.   triamcinolone ointment (KENALOG) 0.1 % Apply 1 application topically 2 (two) times daily.   No current facility-administered medications on file prior to visit.    Allergies:  No Known Allergies  Social History:  Social History   Socioeconomic History   Marital status: Married    Spouse name: Not on file   Number of children: Not on file   Years of education: 10th grade, GED   Highest education level: GED or equivalent  Occupational History   Occupation: disabled   Tobacco Use   Smoking status: Never    Passive exposure: Never   Smokeless tobacco: Never  Vaping Use   Vaping Use: Never used  Substance and Sexual Activity   Alcohol use: Not Currently    Alcohol/week: 0.0 standard drinks    Comment: occasionally, twice a year    Drug use: No   Sexual activity: Not Currently  Other Topics Concern   Not on  file  Social History Narrative   [employer] 3x a week    Social Determinants of Health   Financial Resource Strain: Low Risk    Difficulty of Paying Living Expenses: Not hard at all  Food Insecurity: No Food Insecurity   Worried About Charity fundraiser in the Last Year: Never true   Arboriculturist in the Last Year: Never true  Transportation Needs: No Transportation Needs   Lack of Transportation (Medical): No   Lack of Transportation (Non-Medical): No  Physical Activity: Inactive   Days of Exercise per Week: 0 days   Minutes of Exercise per Session: 0 min  Stress: No Stress Concern Present   Feeling of Stress : Not at all  Social Connections: Not  on file  Intimate Partner Violence: Not on file   Social History   Tobacco Use  Smoking Status Never   Passive exposure: Never  Smokeless Tobacco Never   Social History   Substance and Sexual Activity  Alcohol Use Not Currently   Alcohol/week: 0.0 standard drinks   Comment: occasionally, twice a year     Family History:  Family History  Problem Relation Age of Onset   Breast cancer Mother    Cancer Father        unsure, passed away before it was confirmed   Pancreatic cancer Daughter    Diabetes Neg Hx    Heart disease Neg Hx    Hypertension Neg Hx    Stroke Neg Hx    COPD Neg Hx    Colon cancer Neg Hx     Past medical history, surgical history, medications, allergies, family history and social history reviewed with patient today and changes made to appropriate areas of the chart.   Review of Systems  Constitutional: Negative.   HENT:  Positive for congestion, ear pain and sinus pain. Negative for ear discharge, hearing loss, nosebleeds, sore throat and tinnitus.   Eyes: Negative.   Respiratory:  Positive for cough, shortness of breath and wheezing. Negative for hemoptysis, sputum production and stridor.   Cardiovascular: Negative.   Gastrointestinal:  Positive for nausea. Negative for abdominal pain, blood in  stool, constipation, diarrhea, heartburn, melena and vomiting.  Genitourinary: Negative.   Musculoskeletal: Negative.   Skin: Negative.   Neurological: Negative.   Endo/Heme/Allergies:  Positive for environmental allergies. Negative for polydipsia. Does not bruise/bleed easily.  Psychiatric/Behavioral: Negative.    All other ROS negative except what is listed above and in the HPI.      Objective:    BP (!) 147/79 (BP Location: Left Arm, Cuff Size: Normal)    Pulse 64    Temp 97.7 F (36.5 C) (Oral)    Ht 5' 7.5" (1.715 m)    Wt 282 lb 12.8 oz (128.3 kg)    SpO2 97%    BMI 43.64 kg/m   Wt Readings from Last 3 Encounters:  10/13/21 282 lb 12.8 oz (128.3 kg)  10/05/21 283 lb (128.4 kg)  09/24/21 277 lb 12.8 oz (126 kg)    Physical Exam Vitals and nursing note reviewed.  Constitutional:      General: He is not in acute distress.    Appearance: Normal appearance. He is obese. He is not ill-appearing, toxic-appearing or diaphoretic.  HENT:     Head: Normocephalic and atraumatic.     Right Ear: Tympanic membrane, ear canal and external ear normal. There is no impacted cerumen.     Left Ear: Tympanic membrane, ear canal and external ear normal. There is no impacted cerumen.     Nose: Nose normal. No congestion or rhinorrhea.     Mouth/Throat:     Mouth: Mucous membranes are moist.     Pharynx: Oropharynx is clear. No oropharyngeal exudate or posterior oropharyngeal erythema.  Eyes:     General: No scleral icterus.       Right eye: No discharge.        Left eye: No discharge.     Extraocular Movements: Extraocular movements intact.     Conjunctiva/sclera: Conjunctivae normal.     Pupils: Pupils are equal, round, and reactive to light.  Neck:     Vascular: No carotid bruit.  Cardiovascular:     Rate and Rhythm: Normal rate and regular  rhythm.     Pulses: Normal pulses.     Heart sounds: No murmur heard.   No friction rub. No gallop.  Pulmonary:     Effort: Pulmonary effort is  normal. No respiratory distress.     Breath sounds: Normal breath sounds. No stridor. No wheezing, rhonchi or rales.  Chest:     Chest wall: No tenderness.  Abdominal:     General: Abdomen is flat. Bowel sounds are normal. There is no distension.     Palpations: Abdomen is soft. There is no mass.     Tenderness: There is no abdominal tenderness. There is no right CVA tenderness, left CVA tenderness, guarding or rebound.     Hernia: No hernia is present.  Genitourinary:    Comments: Genital exam deferred with shared decision making Musculoskeletal:        General: No swelling, tenderness, deformity or signs of injury. Normal range of motion.     Cervical back: Normal range of motion and neck supple. No rigidity. No muscular tenderness.     Right lower leg: No edema.     Left lower leg: No edema.  Lymphadenopathy:     Cervical: No cervical adenopathy.  Skin:    General: Skin is warm and dry.     Capillary Refill: Capillary refill takes less than 2 seconds.     Coloration: Skin is not jaundiced or pale.     Findings: No bruising, erythema, lesion or rash.  Neurological:     General: No focal deficit present.     Mental Status: He is alert and oriented to person, place, and time.     Cranial Nerves: No cranial nerve deficit.     Sensory: No sensory deficit.     Motor: No weakness.     Coordination: Coordination normal.     Gait: Gait normal.     Deep Tendon Reflexes: Reflexes normal.  Psychiatric:        Mood and Affect: Mood normal.        Behavior: Behavior normal.        Thought Content: Thought content normal.        Judgment: Judgment normal.    Results for orders placed or performed in visit on 10/13/21  Microscopic Examination   Urine  Result Value Ref Range   WBC, UA 11-30 (A) 0 - 5 /hpf   RBC None seen 0 - 2 /hpf   Epithelial Cells (non renal) None seen 0 - 10 /hpf   Mucus, UA Present (A) Not Estab.   Bacteria, UA Many (A) None seen/Few  Urinalysis, Routine w  reflex microscopic  Result Value Ref Range   Specific Gravity, UA 1.025 1.005 - 1.030   pH, UA 5.5 5.0 - 7.5   Color, UA Yellow Yellow   Appearance Ur Cloudy (A) Clear   Leukocytes,UA 2+ (A) Negative   Protein,UA Negative Negative/Trace   Glucose, UA Negative Negative   Ketones, UA Negative Negative   RBC, UA Negative Negative   Bilirubin, UA Negative Negative   Urobilinogen, Ur 0.2 0.2 - 1.0 mg/dL   Nitrite, UA Negative Negative   Microscopic Examination See below:   Microalbumin, Urine Waived  Result Value Ref Range   Microalb, Ur Waived 80 (H) 0 - 19 mg/L   Creatinine, Urine Waived 200 10 - 300 mg/dL   Microalb/Creat Ratio <30 <30 mg/g      Assessment & Plan:   Problem List Items Addressed This Visit  Cardiovascular and Mediastinum   Benign hypertension    Running a little high today, but very irritable. Will start treatment for irritability and recheck 1 month. Refills given today.      Relevant Medications   tadalafil (CIALIS) 5 MG tablet   lisinopril (ZESTRIL) 20 MG tablet   Other Relevant Orders   Comprehensive metabolic panel   CBC with Differential/Platelet   TSH   Microalbumin, Urine Waived (Completed)     Respiratory   Rhinitis, allergic    Acting up. Will give triamcinalone shot today. Continue allergy medicines. Await appointment with ENT. Call with any concerns.       Relevant Medications   triamcinolone acetonide (KENALOG-40) injection 40 mg   Other Relevant Orders   Comprehensive metabolic panel   CBC with Differential/Platelet     Digestive   Fatty liver    Encouraged diet and exercise. Call with any concerns. Continue to monitor.       GERD (gastroesophageal reflux disease)    Under good control on current regimen. Continue current regimen. Continue to monitor. Call with any concerns. Refills given. Labs drawn today.       Relevant Medications   omeprazole (PRILOSEC) 40 MG capsule   lubiprostone (AMITIZA) 8 MCG capsule    famotidine (PEPCID) 20 MG tablet   Other Relevant Orders   Comprehensive metabolic panel   CBC with Differential/Platelet     Genitourinary   Benign prostatic hyperplasia with lower urinary tract symptoms    Under good control on current regimen. Continue current regimen. Continue to monitor. Call with any concerns. Refills given. Continue to follow with urology.       Relevant Orders   Comprehensive metabolic panel   CBC with Differential/Platelet   PSA   Urinalysis, Routine w reflex microscopic (Completed)     Other   Morbid obesity ([place])    Encouraged diet and exercise with the goal of losing 1-2lbs per week. Call with any concerns.       Hyperlipidemia    Under good control on current regimen. Continue current regimen. Continue to monitor. Call with any concerns. Refills given. Labs drawn today.       Relevant Medications   tadalafil (CIALIS) 5 MG tablet   lisinopril (ZESTRIL) 20 MG tablet   Other Relevant Orders   Comprehensive metabolic panel   CBC with Differential/Platelet   Lipid Panel w/o Chol/HDL Ratio   Vitamin D deficiency    Rechecking labs today. Await results. Treat as needed.       Relevant Orders   Comprehensive metabolic panel   CBC with Differential/Platelet   Irritability    Will start low dose sertraline and recheck 1 month. Call with any concerns.       Other Visit Diagnoses     Routine general medical examination at a health care facility    -  Primary   Vaccines up to date. Screening labs checked today. Colonoscopy up to date. Continue diet and exercise. Call with any concerns.         Discussed aspirin prophylaxis for myocardial infarction prevention and decision was made to continue ASA  LABORATORY TESTING:  Health maintenance labs ordered today as discussed above.   The natural history of prostate cancer and ongoing controversy regarding screening and potential treatment outcomes of prostate cancer has been discussed with the  patient. The meaning of a false positive PSA and a false negative PSA has been discussed. He indicates understanding of the limitations of this screening  test and wishes to proceed with screening PSA testing.   IMMUNIZATIONS:   - Tdap: Tetanus vaccination status reviewed: last tetanus booster within 10 years. - Influenza: Up to date - Pneumovax: Up to date - Prevnar: Not applicable - COVID: Up to date - HPV: Not applicable - Shingrix vaccine: Up to date  SCREENING: - Colonoscopy: Up to date  Discussed with patient purpose of the colonoscopy is to detect colon cancer at curable precancerous or early stages   PATIENT COUNSELING:    Sexuality: Discussed sexually transmitted diseases, partner selection, use of condoms, avoidance of unintended pregnancy  and contraceptive alternatives.   Advised to avoid cigarette smoking.  I discussed with the patient that most people either abstain from alcohol or drink within safe limits (<=14/week and <=4 drinks/occasion for males, <=7/weeks and <= 3 drinks/occasion for females) and that the risk for alcohol disorders and other health effects rises proportionally with the number of drinks per week and how often a drinker exceeds daily limits.  Discussed cessation/primary prevention of drug use and availability of treatment for abuse.   Diet: Encouraged to adjust caloric intake to maintain  or achieve ideal body weight, to reduce intake of dietary saturated fat and total fat, to limit sodium intake by avoiding high sodium foods and not adding table salt, and to maintain adequate dietary potassium and calcium preferably from fresh fruits, vegetables, and low-fat dairy products.    stressed the importance of regular exercise  Injury prevention: Discussed safety belts, safety helmets, smoke detector, smoking near bedding or upholstery.   Dental health: Discussed importance of regular tooth brushing, flossing, and dental visits.   Follow up plan: NEXT  PREVENTATIVE PHYSICAL DUE IN 1 YEAR. No follow-ups on file.

## 2021-10-14 ENCOUNTER — Encounter: Payer: Self-pay | Admitting: Family Medicine

## 2021-10-14 ENCOUNTER — Telehealth: Payer: Self-pay

## 2021-10-14 DIAGNOSIS — J0100 Acute maxillary sinusitis, unspecified: Secondary | ICD-10-CM

## 2021-10-14 LAB — CBC WITH DIFFERENTIAL/PLATELET
Basophils Absolute: 0.1 10*3/uL (ref 0.0–0.2)
Basos: 1 %
EOS (ABSOLUTE): 0.2 10*3/uL (ref 0.0–0.4)
Eos: 4 %
Hematocrit: 44.4 % (ref 37.5–51.0)
Hemoglobin: 15.2 g/dL (ref 13.0–17.7)
Immature Grans (Abs): 0 10*3/uL (ref 0.0–0.1)
Immature Granulocytes: 0 %
Lymphocytes Absolute: 2 10*3/uL (ref 0.7–3.1)
Lymphs: 35 %
MCH: 31.2 pg (ref 26.6–33.0)
MCHC: 34.2 g/dL (ref 31.5–35.7)
MCV: 91 fL (ref 79–97)
Monocytes Absolute: 0.6 10*3/uL (ref 0.1–0.9)
Monocytes: 11 %
Neutrophils Absolute: 2.7 10*3/uL (ref 1.4–7.0)
Neutrophils: 49 %
Platelets: 211 10*3/uL (ref 150–450)
RBC: 4.87 x10E6/uL (ref 4.14–5.80)
RDW: 13.7 % (ref 11.6–15.4)
WBC: 5.6 10*3/uL (ref 3.4–10.8)

## 2021-10-14 LAB — COMPREHENSIVE METABOLIC PANEL
ALT: 52 IU/L — ABNORMAL HIGH (ref 0–44)
AST: 48 IU/L — ABNORMAL HIGH (ref 0–40)
Albumin/Globulin Ratio: 1.9 (ref 1.2–2.2)
Albumin: 4.3 g/dL (ref 3.8–4.8)
Alkaline Phosphatase: 60 IU/L (ref 44–121)
BUN/Creatinine Ratio: 18 (ref 10–24)
BUN: 16 mg/dL (ref 8–27)
Bilirubin Total: 0.4 mg/dL (ref 0.0–1.2)
CO2: 23 mmol/L (ref 20–29)
Calcium: 9.2 mg/dL (ref 8.6–10.2)
Chloride: 105 mmol/L (ref 96–106)
Creatinine, Ser: 0.88 mg/dL (ref 0.76–1.27)
Globulin, Total: 2.3 g/dL (ref 1.5–4.5)
Glucose: 117 mg/dL — ABNORMAL HIGH (ref 70–99)
Potassium: 4.2 mmol/L (ref 3.5–5.2)
Sodium: 142 mmol/L (ref 134–144)
Total Protein: 6.6 g/dL (ref 6.0–8.5)
eGFR: 96 mL/min/{1.73_m2} (ref >59)

## 2021-10-14 LAB — LIPID PANEL W/O CHOL/HDL RATIO
Cholesterol, Total: 151 mg/dL (ref 100–199)
HDL: 36 mg/dL — ABNORMAL LOW (ref >39)
LDL Chol Calc (NIH): 96 mg/dL (ref 0–99)
Triglycerides: 99 mg/dL (ref 0–149)
VLDL Cholesterol Cal: 19 mg/dL (ref 5–40)

## 2021-10-14 LAB — PSA: Prostate Specific Ag, Serum: 0.5 ng/mL (ref 0.0–4.0)

## 2021-10-14 LAB — TSH: TSH: 3.2 u[IU]/mL (ref 0.450–4.500)

## 2021-10-14 NOTE — Telephone Encounter (Signed)
PA for Tadalafil initiated and submitted via Cover My Meds. Key: [id number]

## 2021-10-15 DIAGNOSIS — J329 Chronic sinusitis, unspecified: Secondary | ICD-10-CM | POA: Diagnosis not present

## 2021-10-15 DIAGNOSIS — J324 Chronic pansinusitis: Secondary | ICD-10-CM | POA: Diagnosis not present

## 2021-10-15 DIAGNOSIS — J310 Chronic rhinitis: Secondary | ICD-10-CM | POA: Diagnosis not present

## 2021-10-16 MED ORDER — GUAIFENESIN ER 600 MG PO TB12: 600.0000 mg | ORAL_TABLET | Freq: Two times a day (BID) | ORAL | 6 refills | Status: DC | PRN

## 2021-10-22 NOTE — Telephone Encounter (Signed)
PA approved, patient notified via mychart message.  ?

## 2021-10-28 DIAGNOSIS — N401 Enlarged prostate with lower urinary tract symptoms: Secondary | ICD-10-CM | POA: Diagnosis not present

## 2021-10-28 DIAGNOSIS — R339 Retention of urine, unspecified: Secondary | ICD-10-CM | POA: Diagnosis not present

## 2021-10-28 DIAGNOSIS — N35919 Unspecified urethral stricture, male, unspecified site: Secondary | ICD-10-CM | POA: Diagnosis not present

## 2021-10-28 DIAGNOSIS — N319 Neuromuscular dysfunction of bladder, unspecified: Secondary | ICD-10-CM | POA: Diagnosis not present

## 2021-11-09 NOTE — Progress Notes (Signed)
? ?BP 108/72   Pulse 63   Temp 97.7 ?F (36.5 ?C)   Wt 274 lb 9.6 oz (124.6 kg)   SpO2 96%   BMI 42.37 kg/m?   ? ?Subjective:  ? ? Patient ID: Derrick Ball., male    DOB: 12-29-1955, 66 y.o.   MRN: [id number] ? ?HPI: ?Derrick Tillery. is a 66 y.o. male ? ?Chief Complaint  ?Patient presents with  ? Irritability  ? MRSA  ?  Patient states he recently seen Dr. Virgia Land and was diagnosed with MRSA   ? ?IRRITABILITY ?Duration: chronic ?Status:uncontrolled ?Anxious mood: yes  ?Excessive worrying: yes ?Irritability: yes  ?Sweating: no ?Nausea: no ?Palpitations:no ?Hyperventilation: no ?Panic attacks: no ?Agoraphobia: no  ?Obscessions/compulsions: no ?Depressed mood: yes ? ?  11/10/2021  ?  8:31 AM 10/13/2021  ? 10:17 AM 09/24/2021  ? 11:22 AM 08/03/2021  ? 11:33 AM 06/19/2021  ?  9:02 AM  ?Depression screen PHQ 2/9  ?Decreased Interest 0 3 3 0 0  ?Down, Depressed, Hopeless 0 3 0 0 0  ?PHQ - 2 Score 0 6 3 0 0  ?Altered sleeping _0 ?Tired, decreased energy _1 ?Change in appetite 0 3 0 1   ?Feeling bad or failure about yourself  0 0 0 0   ?Trouble concentrating 3 0 0 0   ?Moving slowly or fidgety/restless 0 0 0 0   ?Suicidal thoughts 0 0 0 0   ?PHQ-9 Score _2 ?Difficult doing work/chores  Somewhat difficult Somewhat difficult    ? ? ?  11/10/2021  ?  8:31 AM 10/13/2021  ? 10:17 AM 09/24/2021  ? 11:22 AM 08/03/2021  ? 11:33 AM  ?GAD 7 : Generalized Anxiety Score  ?Nervous, Anxious, on Edge 0 0 0 0  ?Control/stop worrying 0 0 0 0  ?Worry too much - different things 0 0 0 0  ?Trouble relaxing 0 0 0 0  ?Restless 0 0 0 0  ?Easily annoyed or irritable _3 ?Afraid - awful might happen 0 0 0 0  ?Total GAD 7 Score _4 ?Anxiety Difficulty Somewhat difficult Somewhat difficult Somewhat difficult   ? ?Anhedonia: no ?Weight changes: no ?Insomnia: no   ?Hypersomnia: no ?Fatigue/loss of energy: no ?Feelings of worthlessness: no ?Feelings of guilt: no ?Impaired concentration/indecisiveness: no ?Suicidal  ideations: no  ?Crying spells: no ?Recent Stressors/Life Changes: no ?  Relationship problems: no ?  Family stress: no   ?  Financial stress: no  ?  Job stress: no  ?  Recent death/loss: no ? ? ?Relevant past medical, surgical, family and social history reviewed and updated as indicated. Interim medical history since our last visit reviewed. ?Allergies and medications reviewed and updated. ? ?Review of Systems  ?Constitutional: Negative.   ?Respiratory: Negative.    ?Cardiovascular: Negative.   ?Gastrointestinal: Negative.   ?Musculoskeletal: Negative.   ?Psychiatric/Behavioral:  Positive for dysphoric mood. Negative for agitation, behavioral problems, confusion, decreased concentration, hallucinations, self-injury, sleep disturbance and suicidal ideas. The patient is nervous/anxious. The patient is not hyperactive.   ? ?Per HPI unless specifically indicated above ? ?   ?Objective:  ?  ?BP 108/72   Pulse 63   Temp 97.7 ?F (36.5 ?C)   Wt 274 lb 9.6 oz (124.6 kg)   SpO2 96%   BMI 42.37 kg/m?   ?Wt Readings from Last 3 Encounters:  ?11/10/21  274 lb 9.6 oz (124.6 kg)  ?10/13/21 282 lb 12.8 oz (128.3 kg)  ?10/05/21 283 lb (128.4 kg)  ?  ?Physical Exam ?Vitals and nursing note reviewed.  ?Constitutional:   ?   General: He is not in acute distress. ?   Appearance: Normal appearance. He is not ill-appearing, toxic-appearing or diaphoretic.  ?HENT:  ?   Head: Normocephalic and atraumatic.  ?   Right Ear: External ear normal.  ?   Left Ear: External ear normal.  ?   Nose: Nose normal.  ?   Mouth/Throat:  ?   Mouth: Mucous membranes are moist.  ?   Pharynx: Oropharynx is clear.  ?Eyes:  ?   General: No scleral icterus.    ?   Right eye: No discharge.     ?   Left eye: No discharge.  ?   Extraocular Movements: Extraocular movements intact.  ?   Conjunctiva/sclera: Conjunctivae normal.  ?   Pupils: Pupils are equal, round, and reactive to light.  ?Cardiovascular:  ?   Rate and Rhythm: Normal rate and regular rhythm.  ?    Pulses: Normal pulses.  ?   Heart sounds: Normal heart sounds. No murmur heard. ?  No friction rub. No gallop.  ?Pulmonary:  ?   Effort: Pulmonary effort is normal. No respiratory distress.  ?   Breath sounds: Normal breath sounds. No stridor. No wheezing, rhonchi or rales.  ?Chest:  ?   Chest wall: No tenderness.  ?Musculoskeletal:     ?   General: Normal range of motion.  ?   Cervical back: Normal range of motion and neck supple.  ?Skin: ?   General: Skin is warm and dry.  ?   Capillary Refill: Capillary refill takes less than 2 seconds.  ?   Coloration: Skin is not jaundiced or pale.  ?   Findings: No bruising, erythema, lesion or rash.  ?Neurological:  ?   General: No focal deficit present.  ?   Mental Status: He is alert and oriented to person, place, and time. Mental status is at baseline.  ?Psychiatric:     ?   Mood and Affect: Mood normal.     ?   Behavior: Behavior normal.     ?   Thought Content: Thought content normal.     ?   Judgment: Judgment normal.  ? ? ?Results for orders placed or performed in visit on 10/13/21  ?Microscopic Examination  ? Urine  ?Result Value Ref Range  ? WBC, UA 11-30 (A) 0 - 5 /hpf  ? RBC None seen 0 - 2 /hpf  ? Epithelial Cells (non renal) None seen 0 - 10 /hpf  ? Mucus, UA Present (A) Not Estab.  ? Bacteria, UA Many (A) None seen/Few  ?Comprehensive metabolic panel  ?Result Value Ref Range  ? Glucose 117 (H) 70 - 99 mg/dL  ? BUN 16 8 - 27 mg/dL  ? Creatinine, Ser 0.88 0.76 - 1.27 mg/dL  ? eGFR 96 >59 mL/min/1.73  ? BUN/Creatinine Ratio 18 10 - 24  ? Sodium 142 134 - 144 mmol/L  ? Potassium 4.2 3.5 - 5.2 mmol/L  ? Chloride 105 96 - 106 mmol/L  ? CO2 23 20 - 29 mmol/L  ? Calcium 9.2 8.6 - 10.2 mg/dL  ? Total Protein 6.6 6.0 - 8.5 g/dL  ? Albumin 4.3 3.8 - 4.8 g/dL  ? Globulin, Total 2.3 1.5 - 4.5 g/dL  ? Albumin/Globulin Ratio 1.9 1.2 - 2.2  ?  Bilirubin Total 0.4 0.0 - 1.2 mg/dL  ? Alkaline Phosphatase 60 44 - 121 IU/L  ? AST 48 (H) 0 - 40 IU/L  ? ALT 52 (H) 0 - 44 IU/L  ?CBC  with Differential/Platelet  ?Result Value Ref Range  ? WBC 5.6 3.4 - 10.8 x10E3/uL  ? RBC 4.87 4.14 - 5.80 x10E6/uL  ? Hemoglobin 15.2 13.0 - 17.7 g/dL  ? Hematocrit 44.4 37.5 - 51.0 %  ? MCV 91 79 - 97 fL  ? MCH 31.2 26.6 - 33.0 pg  ? MCHC 34.2 31.5 - 35.7 g/dL  ? RDW 13.7 11.6 - 15.4 %  ? Platelets 211 150 - 450 x10E3/uL  ? Neutrophils 49 Not Estab. %  ? Lymphs 35 Not Estab. %  ? Monocytes 11 Not Estab. %  ? Eos 4 Not Estab. %  ? Basos 1 Not Estab. %  ? Neutrophils Absolute 2.7 1.4 - 7.0 x10E3/uL  ? Lymphocytes Absolute 2.0 0.7 - 3.1 x10E3/uL  ? Monocytes Absolute 0.6 0.1 - 0.9 x10E3/uL  ? EOS (ABSOLUTE) 0.2 0.0 - 0.4 x10E3/uL  ? Basophils Absolute 0.1 0.0 - 0.2 x10E3/uL  ? Immature Granulocytes 0 Not Estab. %  ? Immature Grans (Abs) 0.0 0.0 - 0.1 x10E3/uL  ?Lipid Panel w/o Chol/HDL Ratio  ?Result Value Ref Range  ? Cholesterol, Total 151 100 - 199 mg/dL  ? Triglycerides 99 0 - 149 mg/dL  ? HDL 36 (L) >39 mg/dL  ? VLDL Cholesterol Cal 19 5 - 40 mg/dL  ? LDL Chol Calc (NIH) 96 0 - 99 mg/dL  ?PSA  ?Result Value Ref Range  ? Prostate Specific Ag, Serum 0.5 0.0 - 4.0 ng/mL  ?TSH  ?Result Value Ref Range  ? TSH 3.200 0.450 - 4.500 uIU/mL  ?Urinalysis, Routine w reflex microscopic  ?Result Value Ref Range  ? Specific Gravity, UA 1.025 1.005 - 1.030  ? pH, UA 5.5 5.0 - 7.5  ? Color, UA Yellow Yellow  ? Appearance Ur Cloudy (A) Clear  ? Leukocytes,UA 2+ (A) Negative  ? Protein,UA Negative Negative/Trace  ? Glucose, UA Negative Negative  ? Ketones, UA Negative Negative  ? RBC, UA Negative Negative  ? Bilirubin, UA Negative Negative  ? Urobilinogen, Ur 0.2 0.2 - 1.0 mg/dL  ? Nitrite, UA Negative Negative  ? Microscopic Examination See below:   ?Microalbumin, Urine Waived  ?Result Value Ref Range  ? Microalb, Ur Waived 80 (H) 0 - 19 mg/L  ? Creatinine, Urine Waived 200 10 - 300 mg/dL  ? Microalb/Creat Ratio <30 <30 mg/g  ? ?   ?Assessment & Plan:  ? ?Problem List Items Addressed This Visit   ? ?  ? Other  ? Irritability -  Primary  ?  Better, but still not under good control. Will check on his medicine and consider increasing sertraline to 141m. Recheck 1 month.  ?  ?  ?  ? ?Follow up plan: ?Return in about 4 weeks (around 12/08/2021). ? ?

## 2021-11-10 ENCOUNTER — Encounter: Payer: Self-pay | Admitting: Family Medicine

## 2021-11-10 ENCOUNTER — Ambulatory Visit (INDEPENDENT_AMBULATORY_CARE_PROVIDER_SITE_OTHER): Payer: PPO | Admitting: Family Medicine

## 2021-11-10 ENCOUNTER — Other Ambulatory Visit: Payer: Self-pay

## 2021-11-10 VITALS — BP 108/72 | HR 63 | Temp 97.7°F | Wt 274.6 lb

## 2021-11-10 DIAGNOSIS — R454 Irritability and anger: Secondary | ICD-10-CM | POA: Diagnosis not present

## 2021-11-10 NOTE — Assessment & Plan Note (Signed)
Better, but still not under good control. Will check on his medicine and consider increasing sertraline to '100mg'$ . Recheck 1 month.  ?

## 2021-11-12 DIAGNOSIS — J329 Chronic sinusitis, unspecified: Secondary | ICD-10-CM | POA: Diagnosis not present

## 2021-11-18 ENCOUNTER — Telehealth: Payer: Self-pay

## 2021-11-18 NOTE — Telephone Encounter (Signed)
Patient messaged in saying medication was refilled correctly.  ?

## 2021-11-18 NOTE — Chronic Care Management (AMB) (Signed)
? ? ?Chronic Care Management ?Pharmacy Assistant  ? ?Name: Derrick Sandoval.  MRN: [id number] DOB: 1955-08-31 ? ?Reason for Encounter: Disease State General ? ? ?Recent office visits:  ?11/10/21-Megan Annia Friendly, DO (PCP) Seen for irritability and MRSA. Increase sertraline 100 mg. Follow up in 1 month. ?10/13/21-Megan Annia Friendly, DO (PCP) Seen for annual exam visit. Labs ordered. Triamcinolone acetonide (KENALOG-40) injection 40 mg given in the office. Start on Sertraline 50 mg once daily. Follow up in 1 year for physical. ?08/18/21-Lauren Avelina Laine, NP. Seen for ear pain. Start on predniSONE (DELTASONE) 10 MG tablet and amoxicillin-clavulanate (AUGMENTIN) 875-125 MG tablet. Return if symptoms worsen or fail to improve. ?08/03/21-Megan Annia Friendly, DO (PCP) Seen for a cough. Chest xray ordered. Follow up 2 weeks. ?07/14/21-Megan Annia Friendly, DO (PCP) Upper respiratory infection. Start on prednisone 50 mg. Return if symptoms worsen or fail to improve. ?06/24/21-Avanti Vigg, MD. (Video visit) Seen for diarrhea. Start on lactobacillus acidophilus & bulgar (LACTINEX) chewable tablet. ?05/21/21-Avanti Vigg, MD. Seen for a headache. Start on azithromycin (ZITHROMAX) 250 MG tablet and fexofenadine (ALLEGRA ALLERGY) 180 MG tablet. ? ?Recent consult visits:  ?10/05/21-Shannona A. McGowan, PA-C (Urology) Seen for 1 year follow up visit. Follow up in 1 year. ?09/24/21-Megan Annia Friendly, DO (PCP) Seen for facial pain. Ambulatory referral to ENT. Return if symptoms worsen or fail to improve. ? ?Hospital visits:  ?None in previous 6 months ? ?Medications: ?Outpatient Encounter Medications as of 11/18/2021  ?Medication Sig  ? acyclovir (ZOVIRAX) 400 MG tablet Take 1 tablet (400 mg total) by mouth 2 (two) times daily.  ? albuterol (VENTOLIN HFA) 108 (90 Base) MCG/ACT inhaler Inhale 1-2 puffs into the lungs every 6 (six) hours as needed for wheezing or shortness of breath.  ? Ascorbic Acid (VITAMIN C) 1000 MG tablet Take 1,000 mg by  mouth at bedtime.   ? aspirin EC 81 MG tablet Take 81 mg by mouth daily.  ? budesonide-formoterol (SYMBICORT) 160-4.5 MCG/ACT inhaler Inhale 2 puffs into the lungs 2 (two) times daily.  ? cetirizine (ZYRTEC) 10 MG tablet Take 1 tablet (10 mg total) by mouth daily.  ? cholecalciferol (VITAMIN D) 1000 UNITS tablet Take 1,000 Units by mouth daily.   ? cyclobenzaprine (FLEXERIL) 10 MG tablet Take 1 tablet (10 mg total) by mouth 3 (three) times daily as needed. for muscle spams  ? famotidine (PEPCID) 20 MG tablet Take 1 tablet (20 mg total) by mouth 2 (two) times daily.  ? fluticasone (FLONASE) 50 MCG/ACT nasal spray Place 1 spray into both nostrils 2 (two) times daily.  ? gentamicin 80 mg, ceFAZolin 1,000 mg in sodium chloride 0.9 % 1,000 mL Irrigate with as directed once.  ? guaiFENesin (MUCINEX) 600 MG 12 hr tablet Take 1 tablet (600 mg total) by mouth 2 (two) times daily as needed for cough or to loosen phlegm.  ? lisinopril (ZESTRIL) 20 MG tablet Take 1 tablet (20 mg total) by mouth daily.  ? lubiprostone (AMITIZA) 8 MCG capsule Take 1 capsule (8 mcg total) by mouth 2 (two) times daily with a meal.  ? methocarbamol (ROBAXIN) 500 MG tablet Take 500 mg by mouth daily as needed.  ? montelukast (SINGULAIR) 10 MG tablet Take 1 tablet (10 mg total) by mouth at bedtime.  ? Multiple Vitamins-Minerals (MULTIVITAMIN WITH MINERALS) tablet Take 1 tablet by mouth daily.  ? naproxen sodium (ALEVE) 220 MG tablet Take 220 mg by mouth daily as needed.  ? nystatin cream (MYCOSTATIN) Apply 1 application topically 2 (  two) times daily.  ? omeprazole (PRILOSEC) 40 MG capsule Take 1 capsule (40 mg total) by mouth daily.  ? sertraline (ZOLOFT) 50 MG tablet Take 1 tablet (50 mg total) by mouth daily.  ? tadalafil (CIALIS) 5 MG tablet TAKE 1 TABLET BY MOUTH ONCE DAILY AS NEEDED FOR BPH  ? triamcinolone ointment (KENALOG) 0.1 % Apply 1 application topically 2 (two) times daily.  ? ?No facility-administered encounter medications on file as of  11/18/2021.  ? ?Unsuccessful attempts to complete assessment call. I have called patient 3x and left 3 voicemail's for the patient to return my call when available. ? ? ?Care Gaps: ?None noted ? ?Star Rating Drugs: ?Lisinopril 20 mg Last filled:11/15/21 90 DS ? ?Corrie Mckusick, RMA ?Health Concierge ? ?

## 2021-11-18 NOTE — Telephone Encounter (Signed)
-----   Message from Valerie Roys, DO sent at 11/10/2021  8:51 AM EDT ----- ?Patient states last refill of meds was not the same- please confirm that he was given a refill of sertraline a couple of days ago- he thinks it was a different medicine ? ?

## 2021-12-07 NOTE — Progress Notes (Signed)
? ?BP 119/80   Pulse (!) 53   Temp 97.6 ?F (36.4 ?C)   Wt 275 lb (124.7 kg)   SpO2 98%   BMI 42.44 kg/m?   ? ?Subjective:  ? ? Patient ID: Derrick Ball., male    DOB: [birth year], 66 y.o.   MRN: [id number] ? ?HPI: ?Derrick Counterman. is a 66 y.o. male ? ?Chief Complaint  ?Patient presents with  ? Irritability  ? Shoulder Pain  ?  Patient states both his shoulders starting hurting about 2-3 weeks ago, says they hurt worse at night. Patient states he noticed the pain when he started mowing the yard   ?  ?IRRITABILITY ?Duration: chronic ?Status:stable ?Anxious mood: yes  ?Excessive worrying: no ?Irritability: yes  ?Sweating: no ?Nausea: no ?Palpitations:no ?Hyperventilation: no ?Panic attacks: no ?Agoraphobia: no  ?Obscessions/compulsions: no ?Depressed mood: no ? ?  12/08/2021  ?  9:18 AM 11/10/2021  ?  8:31 AM 10/13/2021  ? 10:17 AM 09/24/2021  ? 11:22 AM 08/03/2021  ? 11:33 AM  ?Depression screen PHQ 2/9  ?Decreased Interest 0 0 3 3 0  ?Down, Depressed, Hopeless 0 0 3 0 0  ?PHQ - 2 Score 0 0 6 3 0  ?Altered sleeping 2 3 3 3 3   ?Tired, decreased energy 0 1 3 1 3   ?Change in appetite 0 0 3 0 1  ?Feeling bad or failure about yourself  0 0 0 0 0  ?Trouble concentrating 0 3 0 0 0  ?Moving slowly or fidgety/restless 0 0 0 0 0  ?Suicidal thoughts 0 0 0 0 0  ?PHQ-9 Score 2 7 15 7 7   ?Difficult doing work/chores   Somewhat difficult Somewhat difficult   ? ? ?  12/08/2021  ?  9:18 AM 11/10/2021  ?  8:31 AM 10/13/2021  ? 10:17 AM 09/24/2021  ? 11:22 AM  ?GAD 7 : Generalized Anxiety Score  ?Nervous, Anxious, on Edge 3 0 0 0  ?Control/stop worrying 0 0 0 0  ?Worry too much - different things 0 0 0 0  ?Trouble relaxing 1 0 0 0  ?Restless 1 0 0 0  ?Easily annoyed or irritable 1 3 3 3   ?Afraid - awful might happen 0 0 0 0  ?Total GAD 7 Score 6 3 3 3   ?Anxiety Difficulty Not difficult at all Somewhat difficult Somewhat difficult Somewhat difficult  ? ?Anhedonia: no ?Weight changes: no ?Insomnia: no   ?Hypersomnia: no ?Fatigue/loss  of energy: yes ?Feelings of worthlessness: yes ?Feelings of guilt: no ?Impaired concentration/indecisiveness: no ?Suicidal ideations: no  ?Crying spells: no ?Recent Stressors/Life Changes: no ?  Relationship problems: no ?  Family stress: no   ?  Financial stress: no  ?  Job stress: no  ?  Recent death/loss: no ? ?SHOULDER PAIN ?Duration: chronic ?Involved shoulder: bilateral ?Mechanism of injury: unknown ?Location: diffuse ?Onset:gradual ?Severity: moderate  ?Quality:  aching and sore ?Frequency: constant ?Radiation: no ?Aggravating factors: lifting, movement, sleep, and throwing  ?Alleviating factors: nothing, APAP, and NSAIDs  ?Status: worse ?Treatments attempted: rest, ice, heat, APAP, ibuprofen, and aleve  ?Relief with NSAIDs?:  no ?Weakness: no ?Numbness: no ?Decreased grip strength: no ?Redness: no ?Swelling: no ?Bruising: no ?Fevers: no ? ?Relevant past medical, surgical, family and social history reviewed and updated as indicated. Interim medical history since our last visit reviewed. ?Allergies and medications reviewed and updated. ? ?Review of Systems  ?Constitutional: Negative.   ?Respiratory: Negative.    ?Cardiovascular: Negative.   ?  Gastrointestinal: Negative.   ?Musculoskeletal:  Positive for arthralgias, myalgias, neck pain and neck stiffness. Negative for back pain, gait problem and joint swelling.  ?Skin: Negative.   ?Neurological: Negative.   ?Psychiatric/Behavioral: Negative.    ? ?Per HPI unless specifically indicated above ? ?   ?Objective:  ?  ?BP 119/80   Pulse (!) 53   Temp 97.6 ?F (36.4 ?C)   Wt 275 lb (124.7 kg)   SpO2 98%   BMI 42.44 kg/m?   ?Wt Readings from Last 3 Encounters:  ?12/08/21 275 lb (124.7 kg)  ?11/10/21 274 lb 9.6 oz (124.6 kg)  ?10/13/21 282 lb 12.8 oz (128.3 kg)  ?  ?Physical Exam ?Vitals and nursing note reviewed.  ?Constitutional:   ?   General: He is not in acute distress. ?   Appearance: Normal appearance. He is not ill-appearing, toxic-appearing or diaphoretic.   ?HENT:  ?   Head: Normocephalic and atraumatic.  ?   Right Ear: External ear normal.  ?   Left Ear: External ear normal.  ?   Nose: Nose normal.  ?   Mouth/Throat:  ?   Mouth: Mucous membranes are moist.  ?   Pharynx: Oropharynx is clear.  ?Eyes:  ?   General: No scleral icterus.    ?   Right eye: No discharge.     ?   Left eye: No discharge.  ?   Extraocular Movements: Extraocular movements intact.  ?   Conjunctiva/sclera: Conjunctivae normal.  ?   Pupils: Pupils are equal, round, and reactive to light.  ?Cardiovascular:  ?   Rate and Rhythm: Normal rate and regular rhythm.  ?   Pulses: Normal pulses.  ?   Heart sounds: Normal heart sounds. No murmur heard. ?  No friction rub. No gallop.  ?Pulmonary:  ?   Effort: Pulmonary effort is normal. No respiratory distress.  ?   Breath sounds: Normal breath sounds. No stridor. No wheezing, rhonchi or rales.  ?Chest:  ?   Chest wall: No tenderness.  ?Musculoskeletal:     ?   General: Normal range of motion.  ?   Cervical back: Normal range of motion and neck supple.  ?Skin: ?   General: Skin is warm and dry.  ?   Capillary Refill: Capillary refill takes less than 2 seconds.  ?   Coloration: Skin is not jaundiced or pale.  ?   Findings: No bruising, erythema, lesion or rash.  ?Neurological:  ?   General: No focal deficit present.  ?   Mental Status: He is alert and oriented to person, place, and time. Mental status is at baseline.  ?Psychiatric:     ?   Mood and Affect: Mood normal.     ?   Behavior: Behavior normal.     ?   Thought Content: Thought content normal.     ?   Judgment: Judgment normal.  ? ? ?Results for orders placed or performed in visit on 10/13/21  ?Microscopic Examination  ? Urine  ?Result Value Ref Range  ? WBC, UA 11-30 (A) 0 - 5 /hpf  ? RBC None seen 0 - 2 /hpf  ? Epithelial Cells (non renal) None seen 0 - 10 /hpf  ? Mucus, UA Present (A) Not Estab.  ? Bacteria, UA Many (A) None seen/Few  ?Comprehensive metabolic panel  ?Result Value Ref Range  ? Glucose  117 (H) 70 - 99 mg/dL  ? BUN 16 8 - 27 mg/dL  ? Creatinine,  Ser 0.88 0.76 - 1.27 mg/dL  ? eGFR 96 >59 mL/min/1.73  ? BUN/Creatinine Ratio 18 10 - 24  ? Sodium 142 134 - 144 mmol/L  ? Potassium 4.2 3.5 - 5.2 mmol/L  ? Chloride 105 96 - 106 mmol/L  ? CO2 23 20 - 29 mmol/L  ? Calcium 9.2 8.6 - 10.2 mg/dL  ? Total Protein 6.6 6.0 - 8.5 g/dL  ? Albumin 4.3 3.8 - 4.8 g/dL  ? Globulin, Total 2.3 1.5 - 4.5 g/dL  ? Albumin/Globulin Ratio 1.9 1.2 - 2.2  ? Bilirubin Total 0.4 0.0 - 1.2 mg/dL  ? Alkaline Phosphatase 60 44 - 121 IU/L  ? AST 48 (H) 0 - 40 IU/L  ? ALT 52 (H) 0 - 44 IU/L  ?CBC with Differential/Platelet  ?Result Value Ref Range  ? WBC 5.6 3.4 - 10.8 x10E3/uL  ? RBC 4.87 4.14 - 5.80 x10E6/uL  ? Hemoglobin 15.2 13.0 - 17.7 g/dL  ? Hematocrit 44.4 37.5 - 51.0 %  ? MCV 91 79 - 97 fL  ? MCH 31.2 26.6 - 33.0 pg  ? MCHC 34.2 31.5 - 35.7 g/dL  ? RDW 13.7 11.6 - 15.4 %  ? Platelets 211 150 - 450 x10E3/uL  ? Neutrophils 49 Not Estab. %  ? Lymphs 35 Not Estab. %  ? Monocytes 11 Not Estab. %  ? Eos 4 Not Estab. %  ? Basos 1 Not Estab. %  ? Neutrophils Absolute 2.7 1.4 - 7.0 x10E3/uL  ? Lymphocytes Absolute 2.0 0.7 - 3.1 x10E3/uL  ? Monocytes Absolute 0.6 0.1 - 0.9 x10E3/uL  ? EOS (ABSOLUTE) 0.2 0.0 - 0.4 x10E3/uL  ? Basophils Absolute 0.1 0.0 - 0.2 x10E3/uL  ? Immature Granulocytes 0 Not Estab. %  ? Immature Grans (Abs) 0.0 0.0 - 0.1 x10E3/uL  ?Lipid Panel w/o Chol/HDL Ratio  ?Result Value Ref Range  ? Cholesterol, Total 151 100 - 199 mg/dL  ? Triglycerides 99 0 - 149 mg/dL  ? HDL 36 (L) >39 mg/dL  ? VLDL Cholesterol Cal 19 5 - 40 mg/dL  ? LDL Chol Calc (NIH) 96 0 - 99 mg/dL  ?PSA  ?Result Value Ref Range  ? Prostate Specific Ag, Serum 0.5 0.0 - 4.0 ng/mL  ?TSH  ?Result Value Ref Range  ? TSH 3.200 0.450 - 4.500 uIU/mL  ?Urinalysis, Routine w reflex microscopic  ?Result Value Ref Range  ? Specific Gravity, UA 1.025 1.005 - 1.030  ? pH, UA 5.5 5.0 - 7.5  ? Color, UA Yellow Yellow  ? Appearance Ur Cloudy (A) Clear  ?  Leukocytes,UA 2+ (A) Negative  ? Protein,UA Negative Negative/Trace  ? Glucose, UA Negative Negative  ? Ketones, UA Negative Negative  ? RBC, UA Negative Negative  ? Bilirubin, UA Negative Negative  ? Urobilinogen, Ur 0.2

## 2021-12-08 ENCOUNTER — Ambulatory Visit
Admission: RE | Admit: 2021-12-08 | Discharge: 2021-12-08 | Disposition: A | Discharge status: Home / Self Care | Payer: PPO | Attending: Family Medicine | Admitting: Family Medicine

## 2021-12-08 ENCOUNTER — Ambulatory Visit
Admission: RE | Admit: 2021-12-08 | Discharge: 2021-12-08 | Disposition: A | Discharge status: Home / Self Care | Payer: PPO | Source: Ambulatory Visit | Attending: Family Medicine | Admitting: Family Medicine

## 2021-12-08 ENCOUNTER — Ambulatory Visit (INDEPENDENT_AMBULATORY_CARE_PROVIDER_SITE_OTHER): Payer: PPO | Admitting: Family Medicine

## 2021-12-08 ENCOUNTER — Encounter: Payer: Self-pay | Admitting: Family Medicine

## 2021-12-08 VITALS — BP 119/80 | HR 53 | Temp 97.6°F | Wt 275.0 lb

## 2021-12-08 DIAGNOSIS — M4602 Spinal enthesopathy, cervical region: Secondary | ICD-10-CM | POA: Diagnosis not present

## 2021-12-08 DIAGNOSIS — M5412 Radiculopathy, cervical region: Secondary | ICD-10-CM | POA: Diagnosis not present

## 2021-12-08 DIAGNOSIS — G8929 Other chronic pain: Secondary | ICD-10-CM

## 2021-12-08 DIAGNOSIS — M25511 Pain in right shoulder: Secondary | ICD-10-CM

## 2021-12-08 DIAGNOSIS — M25512 Pain in left shoulder: Secondary | ICD-10-CM

## 2021-12-08 DIAGNOSIS — R454 Irritability and anger: Secondary | ICD-10-CM

## 2021-12-08 DIAGNOSIS — M47812 Spondylosis without myelopathy or radiculopathy, cervical region: Secondary | ICD-10-CM | POA: Diagnosis not present

## 2021-12-08 MED ORDER — SERTRALINE HCL 50 MG PO TABS
50.0000 mg | ORAL_TABLET | Freq: Every day | ORAL | 1 refills | Status: DC
Start: 2021-12-08 — End: 2022-04-09

## 2021-12-08 MED ORDER — DICLOFENAC SODIUM 1 % EX GEL
4.0000 g | Freq: Four times a day (QID) | CUTANEOUS | 12 refills | Status: DC
Start: 2021-12-08 — End: 2022-10-14

## 2021-12-08 NOTE — Assessment & Plan Note (Signed)
Under fair control on current regimen. Continue current regimen. Continue to monitor. Call with any concerns. Refills given.   

## 2021-12-08 NOTE — Assessment & Plan Note (Signed)
Has been 2 years since his last x-ray. Will repeat. Will get him into PM&R. Call with any concerns. Continue to monitor.  ?

## 2021-12-21 ENCOUNTER — Encounter: Payer: Self-pay | Admitting: Family Medicine

## 2021-12-23 ENCOUNTER — Encounter: Payer: Self-pay | Admitting: Family Medicine

## 2022-01-20 ENCOUNTER — Telehealth: Payer: Self-pay

## 2022-01-20 NOTE — Progress Notes (Signed)
Chronic Care Management Pharmacy Assistant   Name: Derrick Sandoval.  MRN: [id number] DOB: 1956/07/24   Reason for Encounter: Disease State-General    Recent office visits:  12/08/21 [facility], DO-PCP (Irritability and shoulder pain) Orders: DG Shoulder left/right, DG Cervical spine complete, Ambulatory referral to Physical Medicine Rehab Medication changes: diclofenac Sodium (VOLTAREN) 1 % GEL  Recent consult visits:  None since last coordination call 11/18/21  Hospital visits:  None since last coordination call 11/18/21   Medications: Outpatient Encounter Medications as of 01/20/2022  Medication Sig   acyclovir (ZOVIRAX) 400 MG tablet Take 1 tablet (400 mg total) by mouth 2 (two) times daily.   albuterol (VENTOLIN HFA) 108 (90 Base) MCG/ACT inhaler Inhale 1-2 puffs into the lungs every 6 (six) hours as needed for wheezing or shortness of breath.   Ascorbic Acid (VITAMIN C) 1000 MG tablet Take 1,000 mg by mouth at bedtime.    aspirin EC 81 MG tablet Take 81 mg by mouth daily.   budesonide-formoterol (SYMBICORT) 160-4.5 MCG/ACT inhaler Inhale 2 puffs into the lungs 2 (two) times daily.   cetirizine (ZYRTEC) 10 MG tablet Take 1 tablet (10 mg total) by mouth daily.   cholecalciferol (VITAMIN D) 1000 UNITS tablet Take 1,000 Units by mouth daily.    cyclobenzaprine (FLEXERIL) 10 MG tablet Take 1 tablet (10 mg total) by mouth 3 (three) times daily as needed. for muscle spams   diclofenac Sodium (VOLTAREN) 1 % GEL Apply 4 g topically 4 (four) times daily.   famotidine (PEPCID) 20 MG tablet Take 1 tablet (20 mg total) by mouth 2 (two) times daily.   fluticasone (FLONASE) 50 MCG/ACT nasal spray Place 1 spray into both nostrils 2 (two) times daily.   gentamicin 80 mg, ceFAZolin 1,000 mg in sodium chloride 0.9 % 1,000 mL Irrigate with as directed once.   guaiFENesin (MUCINEX) 600 MG 12 hr tablet Take 1 tablet (600 mg total) by mouth 2 (two) times daily as needed for cough or to loosen  phlegm.   lisinopril (ZESTRIL) 20 MG tablet Take 1 tablet (20 mg total) by mouth daily.   lubiprostone (AMITIZA) 8 MCG capsule Take 1 capsule (8 mcg total) by mouth 2 (two) times daily with a meal.   methocarbamol (ROBAXIN) 500 MG tablet Take 500 mg by mouth daily as needed.   montelukast (SINGULAIR) 10 MG tablet Take 1 tablet (10 mg total) by mouth at bedtime.   Multiple Vitamins-Minerals (MULTIVITAMIN WITH MINERALS) tablet Take 1 tablet by mouth daily.   naproxen sodium (ALEVE) 220 MG tablet Take 220 mg by mouth daily as needed.   nystatin cream (MYCOSTATIN) Apply 1 application topically 2 (two) times daily.   omeprazole (PRILOSEC) 40 MG capsule Take 1 capsule (40 mg total) by mouth daily.   sertraline (ZOLOFT) 50 MG tablet Take 1 tablet (50 mg total) by mouth daily.   tadalafil (CIALIS) 5 MG tablet TAKE 1 TABLET BY MOUTH ONCE DAILY AS NEEDED FOR BPH   triamcinolone ointment (KENALOG) 0.1 % Apply 1 application topically 2 (two) times daily.   No facility-administered encounter medications on file as of 01/20/2022.   [facility] for [facility] Review Call   Chart Review:  Have there been any documented new, changed, or discontinued medications since last visit? Yes (If yes, include name, dose, frequency, date)12/08/21 [facility], [facility], DO-PCP, Medication changes: diclofenac Sodium (VOLTAREN) 1 % GEL  Has there been any documented recent hospitalizations or ED visits since last  visit with Clinical Pharmacist? No Brief Summary (including medication and/or Diagnosis changes):   Adherence Review:  Does the Clinical Pharmacist Assistant have access to adherence rates? Yes Adherence rates for STAR metric medications (List medication(s)/day supply/ last 2 fill dates). Adherence rates for medications indicated for disease state being reviewed (List medication(s)/day supply/ last 2 fill dates). Does the patient have >5 day gap between last estimated fill dates for any of the above  medications or other medication gaps? No Reason for medication gaps.   Disease State Questions:  Able to connect with Patient? Yes  Did patient have any problems with their health recently? No Note problems and Concerns:  Have you had any admissions or emergency room visits or worsening of your condition(s) since last visit? No Details of ED visit, hospital visit and/or worsening condition(s):  Have you had any visits with new specialists or providers since your last visit? No Explain:  Have you had any new health care problem(s) since your last visit? No New problem(s) reported:  Have you run out of any of your medications since you last spoke with clinical pharmacist? No What caused you to run out of your medications?  Are there any medications you are not taking as prescribed? No What kept you from taking your medications as prescribed?  Are you having any issues or side effects with your medications? No Note of issues or side effects:  Do you have any other health concerns or questions you want to discuss with your Clinical Pharmacist before your next visit? No Note additional concerns and questions from Patient.  Are there any health concerns that you feel we can do a better job addressing? No Note Patient's response.  Are you having any problems with any of the following since the last visit: (select all that apply)  None  Details:  12. Any falls since last visit? No  Details:  13. Any increased or uncontrolled pain since last visit? Yes  Details:Patient states that he still has shoulder pain. He states that he is not taking physical therapy because it does not do any good. He states that working out in his yard doe him plenty good for now  14. Next visit Type: None scheduled       15. Additional Details? No    Care Gaps: Colonoscopy-05/12/18 Diabetic Foot Exam-NA Ophthalmology-NA Dexa Scan - NA Annual Well Visit -  Micro albumin-10/13/21 Hemoglobin A1c-  NA  Star Rating Drugs: Lisinopril 20 mg-last fill 11/15/21 90 ds  [place] Pharmacist Assistant [phone]

## 2022-01-26 DIAGNOSIS — N319 Neuromuscular dysfunction of bladder, unspecified: Secondary | ICD-10-CM | POA: Diagnosis not present

## 2022-01-26 DIAGNOSIS — N401 Enlarged prostate with lower urinary tract symptoms: Secondary | ICD-10-CM | POA: Diagnosis not present

## 2022-01-26 DIAGNOSIS — R339 Retention of urine, unspecified: Secondary | ICD-10-CM | POA: Diagnosis not present

## 2022-01-26 DIAGNOSIS — N35919 Unspecified urethral stricture, male, unspecified site: Secondary | ICD-10-CM | POA: Diagnosis not present

## 2022-04-05 ENCOUNTER — Other Ambulatory Visit: Payer: Self-pay

## 2022-04-05 ENCOUNTER — Encounter: Payer: Self-pay | Admitting: Family Medicine

## 2022-04-05 MED ORDER — NYSTATIN 100000 UNIT/GM EX CREA: 1.0000 | TOPICAL_CREAM | Freq: Two times a day (BID) | CUTANEOUS | 3 refills | Status: DC

## 2022-04-05 NOTE — Telephone Encounter (Signed)
Patient requesting refill via MyChart message

## 2022-04-09 ENCOUNTER — Ambulatory Visit (INDEPENDENT_AMBULATORY_CARE_PROVIDER_SITE_OTHER): Payer: PPO | Admitting: Family Medicine

## 2022-04-09 ENCOUNTER — Encounter: Payer: Self-pay | Admitting: Family Medicine

## 2022-04-09 VITALS — BP 132/71 | HR 68 | Temp 97.7°F | Wt 264.8 lb

## 2022-04-09 DIAGNOSIS — R8290 Unspecified abnormal findings in urine: Secondary | ICD-10-CM | POA: Diagnosis not present

## 2022-04-09 DIAGNOSIS — B372 Candidiasis of skin and nail: Secondary | ICD-10-CM | POA: Diagnosis not present

## 2022-04-09 DIAGNOSIS — K219 Gastro-esophageal reflux disease without esophagitis: Secondary | ICD-10-CM

## 2022-04-09 DIAGNOSIS — K760 Fatty (change of) liver, not elsewhere classified: Secondary | ICD-10-CM

## 2022-04-09 DIAGNOSIS — E559 Vitamin D deficiency, unspecified: Secondary | ICD-10-CM | POA: Diagnosis not present

## 2022-04-09 DIAGNOSIS — R454 Irritability and anger: Secondary | ICD-10-CM | POA: Diagnosis not present

## 2022-04-09 DIAGNOSIS — E782 Mixed hyperlipidemia: Secondary | ICD-10-CM | POA: Diagnosis not present

## 2022-04-09 DIAGNOSIS — I10 Essential (primary) hypertension: Secondary | ICD-10-CM | POA: Diagnosis not present

## 2022-04-09 MED ORDER — FAMOTIDINE 20 MG PO TABS: 20.0000 mg | ORAL_TABLET | Freq: Two times a day (BID) | ORAL | 1 refills | Status: DC

## 2022-04-09 MED ORDER — NYSTATIN 100000 UNIT/GM EX POWD
1.0000 | Freq: Three times a day (TID) | CUTANEOUS | 0 refills | Status: DC
Start: 2022-04-09 — End: 2023-10-24

## 2022-04-09 MED ORDER — FLUCONAZOLE 100 MG PO TABS: 100.0000 mg | ORAL_TABLET | Freq: Every day | ORAL | 0 refills | Status: DC

## 2022-04-09 MED ORDER — SERTRALINE HCL 50 MG PO TABS: 50.0000 mg | ORAL_TABLET | Freq: Every day | ORAL | 1 refills | Status: DC

## 2022-04-09 MED ORDER — ACYCLOVIR 400 MG PO TABS: 400.0000 mg | ORAL_TABLET | Freq: Two times a day (BID) | ORAL | 1 refills | Status: DC

## 2022-04-09 MED ORDER — CYCLOBENZAPRINE HCL 10 MG PO TABS: 10.0000 mg | ORAL_TABLET | Freq: Three times a day (TID) | ORAL | 3 refills | Status: DC | PRN

## 2022-04-09 MED ORDER — SERTRALINE HCL 100 MG PO TABS: 100.0000 mg | ORAL_TABLET | Freq: Every day | ORAL | 1 refills | Status: DC

## 2022-04-09 MED ORDER — OMEPRAZOLE 40 MG PO CPDR: 40.0000 mg | DELAYED_RELEASE_CAPSULE | Freq: Every day | ORAL | 1 refills | Status: DC

## 2022-04-09 MED ORDER — MONTELUKAST SODIUM 10 MG PO TABS: 10.0000 mg | ORAL_TABLET | Freq: Every day | ORAL | 1 refills | Status: DC

## 2022-04-09 MED ORDER — LISINOPRIL 20 MG PO TABS: 20.0000 mg | ORAL_TABLET | Freq: Every day | ORAL | 1 refills | Status: DC

## 2022-04-09 NOTE — Assessment & Plan Note (Signed)
Under good control on current regimen. Continue current regimen. Continue to monitor. Call with any concerns. Refills given. Labs drawn today.   

## 2022-04-09 NOTE — Assessment & Plan Note (Signed)
Will increase his sertraline to '100mg'$ . Follow up 3 months. Call with any concerns.

## 2022-04-09 NOTE — Assessment & Plan Note (Signed)
Rechecking labs today. Await results. Treat as needed.  °

## 2022-04-09 NOTE — Progress Notes (Signed)
BP 132/71   Pulse 68   Temp 97.7 F (36.5 C)   Wt 264 lb 12.8 oz (120.1 kg)   SpO2 96%   BMI 40.86 kg/m    Subjective:    Patient ID: Derrick Ball., male    DOB: 08-Aug-1956, 66 y.o.   MRN: [id number]  HPI: Derrick Ouzts. is a 66 y.o. male  Chief Complaint  Patient presents with   Gastroesophageal Reflux   Hyperlipidemia   Hypertension   Fatty Liver   Headache    Patient states he has been getting a headache every other day for a few weeks. Patient states he feels very tired.    Irritability    Per patient he states he has been very irritable lately.    Foul Odor    Patient states he has a foul odor in his private area, does a self catheter 3 times a day.   DEPRESSION Mood status: exacerbated Satisfied with current treatment?: no Symptom severity: moderate  Duration of current treatment : chronic Side effects: no Medication compliance: good compliance Psychotherapy/counseling: no  Previous psychiatric medications: sertraline Depressed mood: yes Anxious mood: yes Anhedonia: no Significant weight loss or gain: no Insomnia: no  Fatigue: yes Feelings of worthlessness or guilt: no Impaired concentration/indecisiveness: no Suicidal ideations: no Hopelessness: no Crying spells: no    04/09/2022    1:08 PM 12/08/2021    9:18 AM 11/10/2021    8:31 AM 10/13/2021   10:17 AM 09/24/2021   11:22 AM  Depression screen PHQ 2/9  Decreased Interest 1 0 0 3 3  Down, Depressed, Hopeless 1 0 0 3 0  PHQ - 2 Score 2 0 0 6 3  Altered sleeping 1 2 3 3 3   Tired, decreased energy 1 0 1 3 1   Change in appetite 1 0 0 3 0  Feeling bad or failure about yourself  0 0 0 0 0  Trouble concentrating 0 0 3 0 0  Moving slowly or fidgety/restless 0 0 0 0 0  Suicidal thoughts 0 0 0 0 0  PHQ-9 Score 5 2 7 15 7   Difficult doing work/chores    Somewhat difficult Somewhat difficult    HYPERTENSION / HYPERLIPIDEMIA Satisfied with current treatment? yes Duration of hypertension:  chronic BP monitoring frequency: not checking BP medication side effects: no Past BP meds: lisinopril,  Duration of hyperlipidemia: chronic Cholesterol medication side effects: no Cholesterol supplements: none Past cholesterol medications: none Medication compliance: excellent compliance Aspirin: yes Recent stressors: no Recurrent headaches: no Visual changes: no Palpitations: no Dyspnea: no Chest pain: no Lower extremity edema: no Dizzy/lightheaded: no   Relevant past medical, surgical, family and social history reviewed and updated as indicated. Interim medical history since our last visit reviewed. Allergies and medications reviewed and updated.  Review of Systems  Constitutional: Negative.   Respiratory: Negative.    Cardiovascular: Negative.   Gastrointestinal: Negative.   Genitourinary: Negative.   Musculoskeletal: Negative.   Skin:  Positive for rash. Negative for color change, pallor and wound.  Neurological: Negative.   Psychiatric/Behavioral:  Positive for agitation. Negative for behavioral problems, confusion, decreased concentration, dysphoric mood, hallucinations, self-injury, sleep disturbance and suicidal ideas. The patient is not nervous/anxious and is not hyperactive.     Per HPI unless specifically indicated above     Objective:    BP 132/71   Pulse 68   Temp 97.7 F (36.5 C)   Wt 264 lb 12.8 oz (120.1 kg)  SpO2 96%   BMI 40.86 kg/m   Wt Readings from Last 3 Encounters:  04/09/22 264 lb 12.8 oz (120.1 kg)  12/08/21 275 lb (124.7 kg)  11/10/21 274 lb 9.6 oz (124.6 kg)    Physical Exam Vitals and nursing note reviewed.  Constitutional:      General: He is not in acute distress.    Appearance: Normal appearance. He is not ill-appearing, toxic-appearing or diaphoretic.  HENT:     Head: Normocephalic and atraumatic.     Right Ear: External ear normal.     Left Ear: External ear normal.     Nose: Nose normal.     Mouth/Throat:     Mouth:  Mucous membranes are moist.     Pharynx: Oropharynx is clear.  Eyes:     General: No scleral icterus.       Right eye: No discharge.        Left eye: No discharge.     Extraocular Movements: Extraocular movements intact.     Conjunctiva/sclera: Conjunctivae normal.     Pupils: Pupils are equal, round, and reactive to light.  Cardiovascular:     Rate and Rhythm: Normal rate and regular rhythm.     Pulses: Normal pulses.     Heart sounds: Normal heart sounds. No murmur heard.    No friction rub. No gallop.  Pulmonary:     Effort: Pulmonary effort is normal. No respiratory distress.     Breath sounds: Normal breath sounds. No stridor. No wheezing, rhonchi or rales.  Chest:     Chest wall: No tenderness.  Musculoskeletal:        General: Normal range of motion.     Cervical back: Normal range of motion and neck supple.  Skin:    General: Skin is warm and dry.     Capillary Refill: Capillary refill takes less than 2 seconds.     Coloration: Skin is not jaundiced or pale.     Findings: Rash (goin) present. No bruising, erythema or lesion.  Neurological:     General: No focal deficit present.     Mental Status: He is alert and oriented to person, place, and time. Mental status is at baseline.  Psychiatric:        Mood and Affect: Mood normal.        Behavior: Behavior normal.        Thought Content: Thought content normal.        Judgment: Judgment normal.     Results for orders placed or performed in visit on 10/13/21  Microscopic Examination   Urine  Result Value Ref Range   WBC, UA 11-30 (A) 0 - 5 /hpf   RBC, Urine None seen 0 - 2 /hpf   Epithelial Cells (non renal) None seen 0 - 10 /hpf   Mucus, UA Present (A) Not Estab.   Bacteria, UA Many (A) None seen/Few  Comprehensive metabolic panel  Result Value Ref Range   Glucose 117 (H) 70 - 99 mg/dL   BUN 16 8 - 27 mg/dL   Creatinine, Ser 0.88 0.76 - 1.27 mg/dL   eGFR 96 >59 mL/min/1.73   BUN/Creatinine Ratio 18 10 - 24    Sodium 142 134 - 144 mmol/L   Potassium 4.2 3.5 - 5.2 mmol/L   Chloride 105 96 - 106 mmol/L   CO2 23 20 - 29 mmol/L   Calcium 9.2 8.6 - 10.2 mg/dL   Total Protein 6.6 6.0 - 8.5 g/dL  Albumin 4.3 3.8 - 4.8 g/dL   Globulin, Total 2.3 1.5 - 4.5 g/dL   Albumin/Globulin Ratio 1.9 1.2 - 2.2   Bilirubin Total 0.4 0.0 - 1.2 mg/dL   Alkaline Phosphatase 60 44 - 121 IU/L   AST 48 (H) 0 - 40 IU/L   ALT 52 (H) 0 - 44 IU/L  CBC with Differential/Platelet  Result Value Ref Range   WBC 5.6 3.4 - 10.8 x10E3/uL   RBC 4.87 4.14 - 5.80 x10E6/uL   Hemoglobin 15.2 13.0 - 17.7 g/dL   Hematocrit 44.4 37.5 - 51.0 %   MCV 91 79 - 97 fL   MCH 31.2 26.6 - 33.0 pg   MCHC 34.2 31.5 - 35.7 g/dL   RDW 13.7 11.6 - 15.4 %   Platelets 211 150 - 450 x10E3/uL   Neutrophils 49 Not Estab. %   Lymphs 35 Not Estab. %   Monocytes 11 Not Estab. %   Eos 4 Not Estab. %   Basos 1 Not Estab. %   Neutrophils Absolute 2.7 1.4 - 7.0 x10E3/uL   Lymphocytes Absolute 2.0 0.7 - 3.1 x10E3/uL   Monocytes Absolute 0.6 0.1 - 0.9 x10E3/uL   EOS (ABSOLUTE) 0.2 0.0 - 0.4 x10E3/uL   Basophils Absolute 0.1 0.0 - 0.2 x10E3/uL   Immature Granulocytes 0 Not Estab. %   Immature Grans (Abs) 0.0 0.0 - 0.1 x10E3/uL  Lipid Panel w/o Chol/HDL Ratio  Result Value Ref Range   Cholesterol, Total 151 100 - 199 mg/dL   Triglycerides 99 0 - 149 mg/dL   HDL 36 (L) >39 mg/dL   VLDL Cholesterol Cal 19 5 - 40 mg/dL   LDL Chol Calc (NIH) 96 0 - 99 mg/dL  PSA  Result Value Ref Range   Prostate Specific Ag, Serum 0.5 0.0 - 4.0 ng/mL  TSH  Result Value Ref Range   TSH 3.200 0.450 - 4.500 uIU/mL  Urinalysis, Routine w reflex microscopic  Result Value Ref Range   Specific Gravity, UA 1.025 1.005 - 1.030   pH, UA 5.5 5.0 - 7.5   Color, UA Yellow Yellow   Appearance Ur Cloudy (A) Clear   Leukocytes,UA 2+ (A) Negative   Protein,UA Negative Negative/Trace   Glucose, UA Negative Negative   Ketones, UA Negative Negative   RBC, UA Negative Negative    Bilirubin, UA Negative Negative   Urobilinogen, Ur 0.2 0.2 - 1.0 mg/dL   Nitrite, UA Negative Negative   Microscopic Examination See below:   Microalbumin, Urine Waived  Result Value Ref Range   Microalb, Ur Waived 80 (H) 0 - 19 mg/L   Creatinine, Urine Waived 200 10 - 300 mg/dL   Microalb/Creat Ratio <30 <30 mg/g      Assessment & Plan:   Problem List Items Addressed This Visit       Cardiovascular and Mediastinum   Benign hypertension    Under good control on current regimen. Continue current regimen. Continue to monitor. Call with any concerns. Refills given. Labs drawn today.       Relevant Medications   lisinopril (ZESTRIL) 20 MG tablet   Other Relevant Orders   Comprehensive metabolic panel   CBC with Differential/Platelet     Digestive   Fatty liver    Rechecking labs today. Await results. Treat as needed.        Relevant Orders   Comprehensive metabolic panel   CBC with Differential/Platelet   GERD (gastroesophageal reflux disease) - Primary   Relevant Medications   omeprazole (  PRILOSEC) 40 MG capsule   famotidine (PEPCID) 20 MG tablet   Other Relevant Orders   Comprehensive metabolic panel   CBC with Differential/Platelet     Other   Hyperlipidemia    Under good control on current regimen. Continue current regimen. Continue to monitor. Call with any concerns. Refills given. Labs drawn today.       Relevant Medications   lisinopril (ZESTRIL) 20 MG tablet   Other Relevant Orders   Comprehensive metabolic panel   CBC with Differential/Platelet   Lipid Panel w/o Chol/HDL Ratio   Vitamin D deficiency    Rechecking labs today. Await results. Treat as needed.       Relevant Orders   Comprehensive metabolic panel   CBC with Differential/Platelet   VITAMIN D 25 Hydroxy (Vit-D Deficiency, Fractures)   Irritability    Will increase his sertraline to 136m. Follow up 3 months. Call with any concerns.       Relevant Orders   Comprehensive metabolic  panel   CBC with Differential/Platelet   Other Visit Diagnoses     Candidal intertrigo       Will treat with diflucan. Call if not getting better or getting worse.   Relevant Medications   nystatin (MYCOSTATIN/NYSTOP) powder   fluconazole (DIFLUCAN) 100 MG tablet   acyclovir (ZOVIRAX) 400 MG tablet   Other Relevant Orders   Comprehensive metabolic panel   CBC with Differential/Platelet        Follow up plan: Return in about 3 months (around 07/10/2022) for follow up mood.

## 2022-04-12 ENCOUNTER — Ambulatory Visit (INDEPENDENT_AMBULATORY_CARE_PROVIDER_SITE_OTHER): Payer: PPO

## 2022-04-12 ENCOUNTER — Other Ambulatory Visit: Payer: PPO

## 2022-04-12 DIAGNOSIS — K760 Fatty (change of) liver, not elsewhere classified: Secondary | ICD-10-CM

## 2022-04-12 DIAGNOSIS — E782 Mixed hyperlipidemia: Secondary | ICD-10-CM

## 2022-04-12 DIAGNOSIS — I10 Essential (primary) hypertension: Secondary | ICD-10-CM

## 2022-04-12 DIAGNOSIS — R454 Irritability and anger: Secondary | ICD-10-CM | POA: Diagnosis not present

## 2022-04-12 DIAGNOSIS — K219 Gastro-esophageal reflux disease without esophagitis: Secondary | ICD-10-CM

## 2022-04-12 DIAGNOSIS — R8290 Unspecified abnormal findings in urine: Secondary | ICD-10-CM

## 2022-04-12 DIAGNOSIS — B372 Candidiasis of skin and nail: Secondary | ICD-10-CM | POA: Diagnosis not present

## 2022-04-12 DIAGNOSIS — E559 Vitamin D deficiency, unspecified: Secondary | ICD-10-CM

## 2022-04-12 LAB — URINALYSIS, ROUTINE W REFLEX MICROSCOPIC
Bilirubin, UA: NEGATIVE
Glucose, UA: NEGATIVE
Ketones, UA: NEGATIVE
Nitrite, UA: NEGATIVE
Protein,UA: NEGATIVE
Specific Gravity, UA: 1.02 (ref 1.005–1.030)
Urobilinogen, Ur: 0.2 mg/dL (ref 0.2–1.0)
pH, UA: 5.5 (ref 5.0–7.5)

## 2022-04-12 LAB — MICROSCOPIC EXAMINATION

## 2022-04-12 NOTE — Progress Notes (Signed)
Chronic Care Management Pharmacy Note  04/12/2022 Name:  Derrick Sandoval. MRN:  [id number] DOB:  [birth year]  Summary: Will re-do PAP yearly, move to self-care Recommend adding on statin or adding ICD code stating intolerance Patient hasn't doubled Sertraline dose per PCP note. States he's waiting until his granddaughter visits so she can be there if he has a bad reaction to it  Subjective: Derrick Sandoval. is an 66 y.o. year old male who is a primary patient of Valerie Roys, DO.  The CCM team was consulted for assistance with disease management and care coordination needs.    Engaged with patient by telephone for follow up visit in response to provider referral for pharmacy case management and/or care coordination services.   Consent to Services:  The patient was given the following information about Chronic Care Management services today, agreed to services, and gave verbal consent: 1. CCM service includes personalized support from designated clinical staff supervised by the primary care provider, including individualized plan of care and coordination with other care providers 2. 24/7 contact phone numbers for assistance for urgent and routine care needs. 3. Service will only be billed when office clinical staff spend 20 minutes or more in a month to coordinate care. 4. Only one practitioner may furnish and bill the service in a calendar month. 5.The patient may stop CCM services at any time (effective at the end of the month) by phone call to the office staff. 6. The patient will be responsible for cost sharing (co-pay) of up to 20% of the service fee (after annual deductible is met). Patient agreed to services and consent obtained.  Patient Care Team: Valerie Roys, DO as PCP - General (Family Medicine) Delgaizo, Lavon Paganini, MD as Referring Physician (Orthopedic Surgery) Viprakasit, Marlowe Shores, MD as Referring Physician (Urology) Lane Hacker, [facility] (Pharmacist)  Hospital  visits: None in previous 6 months  Objective:  Lab Results  Component Value Date   CREATININE 0.88 10/13/2021   CREATININE 0.99 04/14/2021   CREATININE 1.04 10/07/2020    Lab Results  Component Value Date   HGBA1C 5.8 04/21/2018   HGBA1C 5.3 12/13/2016   HGBA1C 5.6 08/25/2016   Last diabetic Eye exam: No results found for: "HMDIABEYEEXA"  Last diabetic Foot exam: No results found for: "HMDIABFOOTEX"      Component Value Date/Time   CHOL 151 10/13/2021 0907   CHOL 159 04/14/2021 0904   CHOL 160 10/07/2020 1515   CHOL 135 08/25/2016 0827   CHOL 174 01/23/2016 0908   CHOL 164 01/29/2015 1117   CHOL 93 01/17/2012 0644   TRIG 99 10/13/2021 0907   TRIG 89 04/14/2021 0904   TRIG 123 10/07/2020 1515   TRIG 78 08/25/2016 0827   TRIG 91 01/23/2016 0908   TRIG 109 01/29/2015 1117   TRIG 86 01/17/2012 0644   HDL 36 (L) 10/13/2021 0907   HDL 45 04/14/2021 0904   HDL 37 (L) 10/07/2020 1515   HDL 32 (L) 01/17/2012 0644   VLDL 16 08/25/2016 0827   VLDL 17 01/17/2012 0644   LDLCALC 96 10/13/2021 0907   LDLCALC 97 04/14/2021 0904   LDLCALC 101 (H) 10/07/2020 1515   LDLCALC 44 01/17/2012 0644       Latest Ref Rng & Units 10/13/2021    9:07 AM 04/14/2021    9:04 AM 10/07/2020    3:15 PM  Hepatic Function  Total Protein 6.0 - 8.5 g/dL 6.6  6.8  7.0  Albumin 3.8 - 4.8 g/dL 4.3  4.7  4.7   AST 0 - 40 IU/L 48  38  68   ALT 0 - 44 IU/L 52  46  83   Alk Phosphatase 44 - 121 IU/L 60  64  75   Total Bilirubin 0.0 - 1.2 mg/dL 0.4  0.4  0.4     Lab Results  Component Value Date/Time   TSH 3.200 10/13/2021 09:07 AM   TSH 3.400 10/07/2020 03:15 PM   FREET4 0.99 08/04/2015 03:53 PM       Latest Ref Rng & Units 10/13/2021    9:07 AM 04/14/2021    9:04 AM 10/07/2020    3:15 PM  CBC  WBC 3.4 - 10.8 x10E3/uL 5.6  5.9  7.6   Hemoglobin 13.0 - 17.7 g/dL 15.2  16.1  16.9   Hematocrit 37.5 - 51.0 % 44.4  46.0  49.0   Platelets 150 - 450 x10E3/uL 211  192  178     Lab Results   Component Value Date/Time   VD25OH 34.6 04/14/2021 09:04 AM   VD25OH 30.7 10/07/2020 03:15 PM    Clinical ASCVD:  The 10-year ASCVD risk score (Arnett DK, et al., 2019) is: 14.3%   Values used to calculate the score:     Age: 84 years     Sex: Male     Is Non-Hispanic African American: No     Diabetic: No     Tobacco smoker: No     Systolic Blood Pressure: 335 mmHg     Is BP treated: Yes     HDL Cholesterol: 36 mg/dL     Total Cholesterol: 151 mg/dL    Social History   Tobacco Use  Smoking Status Never   Passive exposure: Never  Smokeless Tobacco Never   BP Readings from Last 3 Encounters:  04/09/22 132/71  12/08/21 119/80  11/10/21 108/72   Pulse Readings from Last 3 Encounters:  04/09/22 68  12/08/21 (!) 53  11/10/21 63   Wt Readings from Last 3 Encounters:  04/09/22 264 lb 12.8 oz (120.1 kg)  12/08/21 275 lb (124.7 kg)  11/10/21 274 lb 9.6 oz (124.6 kg)   BMI Readings from Last 3 Encounters:  04/09/22 40.86 kg/m  12/08/21 42.44 kg/m  11/10/21 42.37 kg/m   Assessment: Review of patient past medical history, allergies, medications, health status, including review of consultants reports, laboratory and other test data, was performed as part of comprehensive evaluation and provision of chronic care management services.   SDOH:  (Social Determinants of Health) assessments and interventions performed:  SDOH Interventions    Flowsheet Row Most Recent Value  SDOH Interventions   Financial Strain Interventions Other (Comment)  Transportation Interventions Intervention Not Indicated       CCM Care Plan  No Known Allergies  Medications Reviewed Today     Reviewed by Lane Hacker, [facility] (Pharmacist) on 04/12/22 at 1322  Med List Status: <None>   Medication Order Taking? Sig Documenting Provider Last Dose Status Informant  acyclovir (ZOVIRAX) 400 MG tablet [id number]  Take 1 tablet (400 mg total) by mouth 2 (two) times daily. Johnson, Megan P, DO   Active   albuterol (VENTOLIN HFA) 108 (90 Base) MCG/ACT inhaler [id number] No Inhale 1-2 puffs into the lungs every 6 (six) hours as needed for wheezing or shortness of breath. Park Liter P, DO Taking Active   Ascorbic Acid (VITAMIN C) 1000 MG tablet [id number] No Take 1,000 mg by mouth at  bedtime.  [provider] Taking Active Self  aspirin EC 81 MG tablet [id number] No Take 81 mg by mouth daily. [provider] Taking Active Self  budesonide-formoterol (SYMBICORT) 160-4.5 MCG/ACT inhaler [id number] No Inhale 2 puffs into the lungs 2 (two) times daily. Johnson, Megan P, DO Taking Active   cetirizine (ZYRTEC) 10 MG tablet [id number] No Take 1 tablet (10 mg total) by mouth daily. Park Liter P, DO Taking Active   cholecalciferol (VITAMIN D) 1000 UNITS tablet [id number] No Take 1,000 Units by mouth daily.  [provider] Taking Active Self  cyclobenzaprine (FLEXERIL) 10 MG tablet [id number]  Take 1 tablet (10 mg total) by mouth 3 (three) times daily as needed. for muscle spams Park Liter P, DO  Active   diclofenac Sodium (VOLTAREN) 1 % GEL [id number] No Apply 4 g topically 4 (four) times daily. Johnson, Megan P, DO Taking Active   famotidine (PEPCID) 20 MG tablet [id number]  Take 1 tablet (20 mg total) by mouth 2 (two) times daily. Johnson, Megan P, DO  Active   fluconazole (DIFLUCAN) 100 MG tablet [id number]  Take 1 tablet (100 mg total) by mouth daily. Johnson, Megan P, DO  Active   fluticasone (FLONASE) 50 MCG/ACT nasal spray [id number] No Place 1 spray into both nostrils 2 (two) times daily. Johnson, Megan P, DO Taking Active   guaiFENesin (MUCINEX) 600 MG 12 hr tablet [id number] No Take 1 tablet (600 mg total) by mouth 2 (two) times daily as needed for cough or to loosen phlegm. Johnson, Megan P, DO Taking Active   lisinopril (ZESTRIL) 20 MG tablet [id number]  Take 1 tablet (20 mg total) by mouth daily. Johnson, Megan P, DO  Active   lubiprostone (AMITIZA) 8 MCG capsule  [id number] No Take 1 capsule (8 mcg total) by mouth 2 (two) times daily with a meal. Wynetta Emery, Megan P, DO Taking Active   methocarbamol (ROBAXIN) 500 MG tablet [id number] No Take 500 mg by mouth daily as needed. [provider] Taking Active   montelukast (SINGULAIR) 10 MG tablet [id number]  Take 1 tablet (10 mg total) by mouth at bedtime. Johnson, Megan P, DO  Active   Multiple Vitamins-Minerals (MULTIVITAMIN WITH MINERALS) tablet [id number] No Take 1 tablet by mouth daily. [provider] Taking Active Self  naproxen sodium (ALEVE) 220 MG tablet [id number] No Take 220 mg by mouth daily as needed. [provider] Taking Active   nystatin (MYCOSTATIN/NYSTOP) powder [id number]  Apply 1 Application topically 3 (three) times daily. Johnson, Megan P, DO  Active   nystatin cream (MYCOSTATIN) [id number] No Apply 1 Application topically 2 (two) times daily. Wynetta Emery, Megan P, DO Taking Active   omeprazole (PRILOSEC) 40 MG capsule [id number]  Take 1 capsule (40 mg total) by mouth daily. Johnson, Megan P, DO  Active   sertraline (ZOLOFT) 100 MG tablet [id number]  Take 1 tablet (100 mg total) by mouth daily. Johnson, Megan P, DO  Active   tadalafil (CIALIS) 5 MG tablet [id number] No TAKE 1 TABLET BY MOUTH ONCE DAILY AS NEEDED FOR BPH Johnson, Megan P, DO Taking Active   triamcinolone ointment (KENALOG) 0.1 % [id number] No Apply 1 application topically 2 (two) times daily. [provider] Taking Active             Patient Active Problem List   Diagnosis Date Noted   Irritability 10/13/2021   Recurrent sinusitis 09/24/2021   Cervical radiculopathy 01/16/2021   Cervical spondylosis 09/16/2020   Tension headache 09/18/2019   Gastric  mass    Genital herpes 01/23/2016   Skin lesion of left arm 08/16/2015   Nail abnormality 08/04/2015   Short-term memory loss 08/04/2015   Dysphagia 05/12/2015   Vitamin D deficiency 05/12/2015   GERD (gastroesophageal reflux disease) 01/29/2015    OA (osteoarthritis) of knee 01/29/2015   Fatty liver 01/28/2015   Left ventricular hypertrophy 01/28/2015   Morbid obesity ([place]) 01/28/2015   Rhinitis, allergic 01/28/2015   Elevated transaminase level 01/28/2015   Hyperlipidemia 01/28/2015   Benign hypertension 01/28/2015   Benign prostatic hyperplasia with lower urinary tract symptoms 01/28/2015    Immunization History  Administered Date(s) Administered   Influenza, Quadrivalent, Recombinant, Inj, Pf 05/29/2021   Influenza,inj,Quad PF,6+ Mos 05/12/2015, 04/29/2016, 05/31/2017, 04/21/2018, 05/04/2019   Influenza-Unspecified 07/28/2015, 05/04/2019, 05/20/2020   Moderna Sars-Covid-2 Vaccination 03/29/2020, 04/26/2020   PFIZER(Purple Top)SARS-COV-2 Vaccination 11/09/2019, 11/30/2019, 04/23/2021   Td 01/23/2016   Tdap 01/05/2012, 10/08/2017   Zoster Recombinat (Shingrix) 05/04/2019, 07/13/2019    Conditions to be addressed/monitored: HLD HTN GERD FLD Rhinitis   Care Plan : [place]  Updates made by Lane Hacker, [place] since 04/12/2022 12:00 AM     Problem: BPH, HTN, GERD, HLD   Priority: High     Long-Range Goal: Disease Management   Start Date: 10/23/2020  Recent Progress: On track  Priority: High  Note:   Current Barriers:  Unable to independently afford treatment regimen Unable to independently monitor therapeutic efficacy  Pharmacist Clinical Goal(s):  Over the next 90 days, patient will verbalize ability to afford treatment regimen achieve adherence to monitoring guidelines and medication adherence to achieve therapeutic efficacy through collaboration with PharmD and provider.    Interventions: 1:1 collaboration with Valerie Roys, DO regarding development and update of comprehensive plan of care as evidenced by provider attestation and co-signature Inter-disciplinary care team collaboration (see longitudinal plan of care) Comprehensive medication review performed; medication list updated in  electronic medical record   Hypertension (BP goal <130/80) -Controlled -Current treatment: Lisinopril 20 mg qd Appropriate, Effective, Safe, Accessible -Medications previously tried: amlodipine  -Current home readings: 130s-140s/80-90s Update6/1/22 Not checking per his report -Denies hypotensive/hypertensive symptoms -Educated on BP goals and benefits of medications for prevention of heart attack, stroke and kidney damage; Daily salt intake goal < 2300 mg; Exercise goal of 150 minutes per week; Importance of home blood pressure monitoring; -Counseled to monitor BP at home 2-3 times weekly, document, and provide log at future appointments -Counseled on diet and exercise extensively Recommended to continue current medication Reccommended checking BP 3 times weekly at home.   CAD: (LDL goal < 100) The 10-year ASCVD risk score (Arnett DK, et al., 2019) is: 14.3%   Values used to calculate the score:     Age: 60 years     Sex: Male     Is Non-Hispanic African American: No     Diabetic: No     Tobacco smoker: No     Systolic Blood Pressure: 818 mmHg     Is BP treated: Yes     HDL Cholesterol: 36 mg/dL     Total Cholesterol: 151 mg/dL Lab Results  Component Value Date   CHOL 151 10/13/2021   CHOL 159 04/14/2021   CHOL 160 10/07/2020   Lab Results  Component Value Date   HDL 36 (L) 10/13/2021   HDL 45 04/14/2021   HDL 37 (L) 10/07/2020   Lab Results  Component Value Date   LDLCALC 96 10/13/2021   [place] 97 04/14/2021  LDLCALC 101 (H) 10/07/2020   Lab Results  Component Value Date   TRIG 99 10/13/2021   TRIG 89 04/14/2021   TRIG 123 10/07/2020  No results found for: "CHOLHDL" No results found for: "LDLDIRECT" -Controlled -Current treatment: Aspirin 81 mg ec qd Appropriate, Effective, Safe, Accessible -Medications previously tried: Simvastatin per cardiology note from 2015 unsure why this was discontinued  -Educated on Cholesterol goals;  Benefits of statin for  ASCVD risk reduction; Importance of limiting foods high in cholesterol; -Recommended statin therapy for fatty liver/HLD or adding reason to exclude therapy  BPH/Urinary retention (Goal: minimize symptoms prevent complications) -Not ideally controlled -Current treatment  Tadalafil 5 mg qd prn (PAP per patient, approved 2023) Appropriate, Effective, Safe, Accessible -Medications previously tried: tamsulosin finasteride s/p TURP -Recommended to continue current medication Assessed patient finances. Cialis patient assistance forms submitted and waiting for approval 01/14/21 Patient approved receiving from PAP Patient self caths tid and is considering placement of an interstem device 01/14/21: Interstim palcement cancelled 2/2/ infection. Not yet rescheduled. Patient want to change suppliers of his catheters. Has 2 numbers to call today. Advised him to return call to Dr. Cherrie Gauze office.  Constipation (Goal:relief of symptoms) -Controlled -Current treatment  Amitiza 8 mcg bid (PAP Approved 2023) Appropriate, Effective, Safe, Accessible -Medications previously tried: NA  -Counseled on diet and exercise extensively Assessed patient finances. Patient assistance application in process 10/21/44 patient approved and receiving.  GERD (Goal: reduction in symptom) -Controlled -Current treatment  Famotidine 20 mg bid Appropriate, Effective, Safe, Accessible Omeprazole 40 mg qd Appropriate, Effective, Safe, Accessible -Medications previously tried: NA  -Counseled on diet and exercise extensively Counseled on nonpharm interventions  Recommended trial off one agent, magnesium level   Patient Goals/Self-Care Activities Over the next 90  days, patient will:  - take medications as prescribed  Follow Up Plan: Telephone follow up appointment with care management team member scheduled for: PRN  [place] Constable, Pharm.D. - [phone]      Current Barriers:  Unable to independently afford treatment  regimen PAP approved on ED, Constipation  Pharmacist Clinical Goal(s):  Patient will verbalize ability to afford treatment regimen achieve adherence to monitoring guidelines and medication adherence to achieve therapeutic efficacy through collaboration with PharmD and provider.   Interventions: 1:1 collaboration with Valerie Roys, DO regarding development and update of comprehensive plan of care as evidenced by provider attestation and co-signature Inter-disciplinary care team collaboration (see longitudinal plan of care) Comprehensive medication review performed; medication list updated in electronic medical record  Hypertension (BP goal <140/90) -Controlled based on home readings -Current treatment: Lisinopril 20 mg  -Reviewed home readings  -Current home readings: today is 130/77 -Denies hypotensive/hypertensive symptoms -Educated on BP goals and benefits of medications for prevention of heart attack, stroke and kidney damage; Exercise goal of 150 minutes per week; Importance of home blood pressure monitoring; -Counseled to monitor BP at home 1-2x/week, document, and provide log at future appointments -Counseled on diet and exercise extensively Recommended to continue current medication  Hyperlipidemia: (LDL goal < 100) -Not ideally controlled ASCVD  10 yr risk<15% 04/2021.  No DM noted, non smoker.  +Morbid obestiy,  HLD, HTN.  -Current treatment: No current Rx therapy -Medications previously tried: atorvastatin noted 2016 (side effect concern/fear of statins) -Educated on Cholesterol goals;  - Will continue to monitor. No changes at this time due to patient not wanting cholesterol medication  Health Maintenance -Vaccine gaps: flu shot -Current therapy:   Medication Assistance: None required.  Patient affirms current  coverage meets needs.  Patient's preferred pharmacy is:  [facility] ([facility]) [place], [place] [address] Phone: [phone] Fax: [phone]  [facility] [address]), [place] - [place]) [place] [address] Phone: [phone] Fax: [phone]  [facility]) - [place], [place] [address] Phone: [phone] Fax: [place], [place] - [place] [address] Phone: [phone] Fax: [phone]   Follow Up:  Patient agrees to Care Plan and Follow-up. Plan: Self Care  Future Appointments  Date Time Provider [place]  06/21/2022  9:00 AM [facility] NURSE HEALTH ADVISOR [facility]  07/13/2022  1:00 PM Valerie Roys, DO [facility] PEC  10/11/2022  9:30 AM [place], Hunt Oris, PA-C [facility] None    [place] Constable, Pharm.D. - [phone]

## 2022-04-12 NOTE — Patient Instructions (Signed)
Visit Information   Goals Addressed   None    Patient Care Plan: CCM Pharmacy Care Plan     Problem Identified: BPH, HTN, GERD, HLD   Priority: High     Long-Range Goal: Disease Management   Start Date: 10/23/2020  Recent Progress: On track  Priority: High  Note:   Current Barriers:  Unable to independently afford treatment regimen Unable to independently monitor therapeutic efficacy  Pharmacist Clinical Goal(s):  Over the next 90 days, patient will verbalize ability to afford treatment regimen achieve adherence to monitoring guidelines and medication adherence to achieve therapeutic efficacy through collaboration with PharmD and provider.    Interventions: 1:1 collaboration with Valerie Roys, DO regarding development and update of comprehensive plan of care as evidenced by provider attestation and co-signature Inter-disciplinary care team collaboration (see longitudinal plan of care) Comprehensive medication review performed; medication list updated in electronic medical record   Hypertension (BP goal <130/80) -Controlled -Current treatment: Lisinopril 20 mg qd Appropriate, Effective, Safe, Accessible -Medications previously tried: amlodipine  -Current home readings: 130s-140s/80-90s Update6/1/22 Not checking per his report -Denies hypotensive/hypertensive symptoms -Educated on BP goals and benefits of medications for prevention of heart attack, stroke and kidney damage; Daily salt intake goal < 2300 mg; Exercise goal of 150 minutes per week; Importance of home blood pressure monitoring; -Counseled to monitor BP at home 2-3 times weekly, document, and provide log at future appointments -Counseled on diet and exercise extensively Recommended to continue current medication Reccommended checking BP 3 times weekly at home.   CAD: (LDL goal < 100) The 10-year ASCVD risk score (Arnett DK, et al., 2019) is: 14.3%   Values used to calculate the score:     Age: 66  years     Sex: Male     Is Non-Hispanic African American: No     Diabetic: No     Tobacco smoker: No     Systolic Blood Pressure: 166 mmHg     Is BP treated: Yes     HDL Cholesterol: 36 mg/dL     Total Cholesterol: 151 mg/dL Lab Results  Component Value Date   CHOL 151 10/13/2021   CHOL 159 04/14/2021   CHOL 160 10/07/2020   Lab Results  Component Value Date   HDL 36 (L) 10/13/2021   HDL 45 04/14/2021   HDL 37 (L) 10/07/2020   Lab Results  Component Value Date   LDLCALC 96 10/13/2021   LDLCALC 97 04/14/2021   LDLCALC 101 (H) 10/07/2020   Lab Results  Component Value Date   TRIG 99 10/13/2021   TRIG 89 04/14/2021   TRIG 123 10/07/2020  No results found for: "CHOLHDL" No results found for: "LDLDIRECT" -Controlled -Current treatment: Aspirin 81 mg ec qd Appropriate, Effective, Safe, Accessible -Medications previously tried: Simvastatin per cardiology note from 2015 unsure why this was discontinued  -Educated on Cholesterol goals;  Benefits of statin for ASCVD risk reduction; Importance of limiting foods high in cholesterol; -Recommended statin therapy for fatty liver/HLD or adding reason to exclude therapy  BPH/Urinary retention (Goal: minimize symptoms prevent complications) -Not ideally controlled -Current treatment  Tadalafil 5 mg qd prn (PAP per patient, approved 2023) Appropriate, Effective, Safe, Accessible -Medications previously tried: tamsulosin finasteride s/p TURP -Recommended to continue current medication Assessed patient finances. Cialis patient assistance forms submitted and waiting for approval 01/14/21 Patient approved receiving from PAP Patient self caths tid and is considering placement of an interstem device 01/14/21: Interstim palcement cancelled 2/2/ infection. Not yet rescheduled.  Patient want to change suppliers of his catheters. Has 2 numbers to call today. Advised him to return call to Dr. Cherrie Gauze office.  Constipation (Goal:relief of  symptoms) -Controlled -Current treatment  Amitiza 8 mcg bid (PAP Approved 2023) Appropriate, Effective, Safe, Accessible -Medications previously tried: NA  -Counseled on diet and exercise extensively Assessed patient finances. Patient assistance application in process 01/18/32 patient approved and receiving.  GERD (Goal: reduction in symptom) -Controlled -Current treatment  Famotidine 20 mg bid Appropriate, Effective, Safe, Accessible Omeprazole 40 mg qd Appropriate, Effective, Safe, Accessible -Medications previously tried: NA  -Counseled on diet and exercise extensively Counseled on nonpharm interventions  Recommended trial off one agent, magnesium level   Patient Goals/Self-Care Activities Over the next 90  days, patient will:  - take medications as prescribed  Follow Up Plan: Telephone follow up appointment with care management team member scheduled for: PRN  [place] Constable, Pharm.D. - [phone]      Mr. Seese was given information about Chronic Care Management services today including:  CCM service includes personalized support from designated clinical staff supervised by his physician, including individualized plan of care and coordination with other care providers 24/7 contact phone numbers for assistance for urgent and routine care needs. Standard insurance, coinsurance, copays and deductibles apply for chronic care management only during months in which we provide at least 20 minutes of these services. Most insurances cover these services at 100%, however patients may be responsible for any copay, coinsurance and/or deductible if applicable. This service may help you avoid the need for more expensive face-to-face services. Only one practitioner may furnish and bill the service in a calendar month. The patient may stop CCM services at any time (effective at the end of the month) by phone call to the office staff.  Patient agreed to services and verbal consent obtained.    The patient verbalized understanding of instructions, educational materials, and care plan provided today and DECLINED offer to receive copy of patient instructions, educational materials, and care plan.  CPP F/U PRN  Lane Hacker, [place]

## 2022-04-13 LAB — CBC WITH DIFFERENTIAL/PLATELET
Basophils Absolute: 0.1 10*3/uL (ref 0.0–0.2)
Basos: 1 %
EOS (ABSOLUTE): 0.3 10*3/uL (ref 0.0–0.4)
Eos: 4 %
Hematocrit: 47.2 % (ref 37.5–51.0)
Hemoglobin: 16.1 g/dL (ref 13.0–17.7)
Immature Grans (Abs): 0 10*3/uL (ref 0.0–0.1)
Immature Granulocytes: 0 %
Lymphocytes Absolute: 3 10*3/uL (ref 0.7–3.1)
Lymphs: 42 %
MCH: 31 pg (ref 26.6–33.0)
MCHC: 34.1 g/dL (ref 31.5–35.7)
MCV: 91 fL (ref 79–97)
Monocytes Absolute: 0.5 10*3/uL (ref 0.1–0.9)
Monocytes: 8 %
Neutrophils Absolute: 3.3 10*3/uL (ref 1.4–7.0)
Neutrophils: 45 %
Platelets: 220 10*3/uL (ref 150–450)
RBC: 5.19 x10E6/uL (ref 4.14–5.80)
RDW: 12.9 % (ref 11.6–15.4)
WBC: 7.1 10*3/uL (ref 3.4–10.8)

## 2022-04-13 LAB — COMPREHENSIVE METABOLIC PANEL
ALT: 49 IU/L — ABNORMAL HIGH (ref 0–44)
AST: 38 IU/L (ref 0–40)
Albumin/Globulin Ratio: 2.2 (ref 1.2–2.2)
Albumin: 4.6 g/dL (ref 3.9–4.9)
Alkaline Phosphatase: 58 IU/L (ref 44–121)
BUN/Creatinine Ratio: 16 (ref 10–24)
BUN: 17 mg/dL (ref 8–27)
Bilirubin Total: 0.4 mg/dL (ref 0.0–1.2)
CO2: 20 mmol/L (ref 20–29)
Calcium: 9.8 mg/dL (ref 8.6–10.2)
Chloride: 105 mmol/L (ref 96–106)
Creatinine, Ser: 1.06 mg/dL (ref 0.76–1.27)
Globulin, Total: 2.1 g/dL (ref 1.5–4.5)
Glucose: 152 mg/dL — ABNORMAL HIGH (ref 70–99)
Potassium: 4.4 mmol/L (ref 3.5–5.2)
Sodium: 142 mmol/L (ref 134–144)
Total Protein: 6.7 g/dL (ref 6.0–8.5)
eGFR: 78 mL/min/{1.73_m2} (ref >59)

## 2022-04-13 LAB — LIPID PANEL W/O CHOL/HDL RATIO
Cholesterol, Total: 159 mg/dL (ref 100–199)
HDL: 44 mg/dL (ref >39)
LDL Chol Calc (NIH): 93 mg/dL (ref 0–99)
Triglycerides: 125 mg/dL (ref 0–149)
VLDL Cholesterol Cal: 22 mg/dL (ref 5–40)

## 2022-04-13 LAB — VITAMIN D 25 HYDROXY (VIT D DEFICIENCY, FRACTURES): Vit D, 25-Hydroxy: 32.9 ng/mL (ref 30.0–100.0)

## 2022-04-14 ENCOUNTER — Other Ambulatory Visit: Payer: Self-pay | Admitting: Family Medicine

## 2022-04-14 MED ORDER — CIPROFLOXACIN HCL 500 MG PO TABS: 500.0000 mg | ORAL_TABLET | Freq: Two times a day (BID) | ORAL | 0 refills | Status: DC

## 2022-04-15 DIAGNOSIS — N400 Enlarged prostate without lower urinary tract symptoms: Secondary | ICD-10-CM | POA: Diagnosis not present

## 2022-04-15 DIAGNOSIS — I10 Essential (primary) hypertension: Secondary | ICD-10-CM

## 2022-04-15 DIAGNOSIS — I2510 Atherosclerotic heart disease of native coronary artery without angina pectoris: Secondary | ICD-10-CM | POA: Diagnosis not present

## 2022-04-26 DIAGNOSIS — N35919 Unspecified urethral stricture, male, unspecified site: Secondary | ICD-10-CM | POA: Diagnosis not present

## 2022-04-26 DIAGNOSIS — N319 Neuromuscular dysfunction of bladder, unspecified: Secondary | ICD-10-CM | POA: Diagnosis not present

## 2022-04-26 DIAGNOSIS — N401 Enlarged prostate with lower urinary tract symptoms: Secondary | ICD-10-CM | POA: Diagnosis not present

## 2022-04-26 DIAGNOSIS — R339 Retention of urine, unspecified: Secondary | ICD-10-CM | POA: Diagnosis not present

## 2022-05-12 ENCOUNTER — Ambulatory Visit (INDEPENDENT_AMBULATORY_CARE_PROVIDER_SITE_OTHER): Payer: PPO | Admitting: Nurse Practitioner

## 2022-05-12 ENCOUNTER — Ambulatory Visit: Payer: Self-pay | Admitting: *Deleted

## 2022-05-12 ENCOUNTER — Encounter: Payer: Self-pay | Admitting: Nurse Practitioner

## 2022-05-12 VITALS — BP 122/85 | HR 77 | Temp 97.8°F | Wt 262.9 lb

## 2022-05-12 DIAGNOSIS — J0100 Acute maxillary sinusitis, unspecified: Secondary | ICD-10-CM | POA: Diagnosis not present

## 2022-05-12 DIAGNOSIS — J029 Acute pharyngitis, unspecified: Secondary | ICD-10-CM | POA: Diagnosis not present

## 2022-05-12 DIAGNOSIS — R6889 Other general symptoms and signs: Secondary | ICD-10-CM | POA: Diagnosis not present

## 2022-05-12 MED ORDER — GUAIFENESIN ER 600 MG PO TB12: 600.0000 mg | ORAL_TABLET | Freq: Two times a day (BID) | ORAL | 0 refills | Status: DC

## 2022-05-12 MED ORDER — METHYLPREDNISOLONE 4 MG PO TBPK: ORAL_TABLET | ORAL | 0 refills | Status: DC

## 2022-05-12 NOTE — Progress Notes (Signed)
BP 122/85   Pulse 77   Temp 97.8 F (36.6 C) (Oral)   Wt 262 lb 14.4 oz (119.3 kg)   SpO2 97%   BMI 40.57 kg/m    Subjective:    Patient ID: Derrick Ball., male    DOB: January 19, 1956, 65 y.o.   MRN: [id number]  HPI: Derrick Giangregorio. is a 66 y.o. male  NOTE WRITTEN BY DNP STUDENT.  ASSESSMENT AND PLAN OF CARE REVIEWED WITH STUDENT, AGREE WITH ABOVE FINDINGS AND PLAN.  Chief Complaint  Patient presents with  . Nasal Congestion    Onset yesterday. Worsening today per patient  . Fatigue     X 3 days. Patient reports slight cough. Denies fever at home.   Marland Kitchen Headache    Intermittent.   . Sore Throat        HEADACHES/FATIGUE Duration: days Onset: gradual Severity: 6/10 Quality: pressure-like and throbbing Frequency: intermittent Location: front of head Headache duration: Radiation: yes; down sides of nose Time of day headache occurs: random, today has been all day Alleviating factors:  Aggravating factors: worse when bending over Headache status at time of visit: current headache Treatments attempted: Treatments attempted:  Mucinex, did not work    Aura: no Nausea:  no Vomiting: no Photophobia:  no Phonophobia:  no Effect on social functioning:  yes Numbers of missed days of school/work each month: 0 Confusion:  no Gait disturbance/ataxia:  yes; depth perception and grip is weaker/off. Feels his coordination is off. Behavioral changes:  no Fevers:  no   Relevant past medical, surgical, family and social history reviewed and updated as indicated. Interim medical history since our last visit reviewed. Allergies and medications reviewed and updated.  Review of Systems  Constitutional:  Positive for activity change, appetite change and fatigue. Negative for chills and fever.  HENT:  Positive for congestion, ear pain, postnasal drip, rhinorrhea, sinus pressure, sinus pain, sneezing and sore throat. Negative for hearing loss and trouble swallowing.   Eyes:   Negative for discharge and itching.  Respiratory:  Positive for cough. Negative for choking, chest tightness, shortness of breath and wheezing.   Cardiovascular:  Negative for chest pain, palpitations and leg swelling.  Gastrointestinal:  Negative for nausea and vomiting.  Musculoskeletal:  Negative for myalgias.  Neurological:  Positive for headaches. Negative for dizziness, light-headedness and numbness.  Psychiatric/Behavioral:  Negative for behavioral problems.     Per HPI unless specifically indicated above     Objective:    BP 122/85   Pulse 77   Temp 97.8 F (36.6 C) (Oral)   Wt 262 lb 14.4 oz (119.3 kg)   SpO2 97%   BMI 40.57 kg/m   Wt Readings from Last 3 Encounters:  05/12/22 262 lb 14.4 oz (119.3 kg)  04/09/22 264 lb 12.8 oz (120.1 kg)  12/08/21 275 lb (124.7 kg)    Physical Exam Constitutional:      Appearance: He is ill-appearing.  HENT:     Head: Normocephalic.     Right Ear: Hearing normal. A middle ear effusion is present. Tympanic membrane is bulging.     Left Ear: Hearing normal. A middle ear effusion is present. Tympanic membrane is bulging.     Nose: Mucosal edema present.     Right Sinus: Maxillary sinus tenderness and frontal sinus tenderness present.     Left Sinus: Maxillary sinus tenderness and frontal sinus tenderness present.     Mouth/Throat:     Mouth: Mucous membranes are  moist.     Pharynx: Posterior oropharyngeal erythema present. No pharyngeal swelling or oropharyngeal exudate.     Tonsils: No tonsillar exudate. 0 on the right. 0 on the left.  Eyes:     Extraocular Movements:     Right eye: Normal extraocular motion.     Left eye: Normal extraocular motion.     Conjunctiva/sclera: Conjunctivae normal.     Pupils: Pupils are equal, round, and reactive to light.  Cardiovascular:     Rate and Rhythm: Normal rate and regular rhythm.     Heart sounds: Normal heart sounds. No murmur heard.    No gallop.  Pulmonary:     Effort: Pulmonary  effort is normal. No respiratory distress.     Breath sounds: Normal breath sounds. No wheezing or rhonchi.  Chest:     Chest wall: No tenderness.  Abdominal:     Palpations: Abdomen is soft.  Skin:    General: Skin is warm and dry.  Neurological:     Mental Status: He is alert and oriented to person, place, and time.  Psychiatric:        Mood and Affect: Mood normal.        Behavior: Behavior normal.    Results for orders placed or performed in visit on 05/12/22  Rapid Strep screen([facility])   Specimen: Other   Other  Result Value Ref Range   Strep Gp A Ag, IA W/Reflex Negative Negative  Culture, Group A Strep   Other  Result Value Ref Range   Strep A Culture WILL FOLLOW   Veritor Flu A/B Waived  Result Value Ref Range   Influenza A Negative Negative   Influenza B Negative Negative      Assessment & Plan:   Problem List Items Addressed This Visit       Respiratory   Acute non-recurrent maxillary sinusitis - Primary    Treating pt for acute sinusitis with methylprednisolone taper. Pt advised he can take his  zyrtec with mucinex and take mucinex twice/day.        Relevant Medications   methylPREDNISolone (MEDROL DOSEPAK) 4 MG TBPK tablet   guaiFENesin (MUCINEX) 600 MG 12 hr tablet     Other   Flu-like symptoms    Pt afebrile, pt currently completely course of ciprofloxacin.  Pt advised to continue abx therapy and to communicate with clinic if symptoms do not improve.       Relevant Orders   Veritor Flu A/B Waived (Completed)   Novel Coronavirus, NAA ([facility])   Sore throat    Strep and COVID swabs completed in clinic. Rapid strep negative. Will notify pt of COVID results.       Relevant Orders   Rapid Strep screen([facility]) (Completed)   Novel Coronavirus, NAA ([facility])   Culture, Group A Strep (Completed)     Follow up plan: Return if symptoms worsen or fail to improve.

## 2022-05-12 NOTE — Telephone Encounter (Signed)
  Chief Complaint: headache worsening  Symptoms: headache, runny nose green mucus , nasal stuffiness, difficulty breathing through nose. Dizziness at times but now walking through "[place]". C/o sinus headache. Taking "qualifanex" OTC with no relief. Requesting RX for "qualifanex 600 mg". Reports still taking cipro for UTI.   Frequency: couple of days  Pertinent Negatives: Patient denies fever, no reports of blurred vision no weakness on either side of body  Disposition: '[]'$ ED /'[]'$ Urgent Care (no appt availability in office) / '[x]'$ Appointment(In office/virtual)/ '[]'$  [facility]/ '[]'$ Home Care/ '[]'$ Refused Recommended Disposition /'[]'$ [facility]/ '[]'$  Follow-up with PCP Additional Notes:  Appt scheduled today .   Reason for Disposition  [1] MODERATE headache (e.g., interferes with normal activities) AND [2] present > 24 hours AND [3] unexplained  (Exceptions: analgesics not tried, typical migraine, or headache part of viral illness)  Answer Assessment - Initial Assessment Questions 1. LOCATION: "Where does it hurt?"      Through nose and forehead 2. ONSET: "When did the headache start?" (Minutes, hours or days)      2-3 days  3. PATTERN: "Does the pain come and go, or has it been constant since it started?"     Constant  4. SEVERITY: "How bad is the pain?" and "What does it keep you from doing?"  (e.g., Scale 1-10; mild, moderate, or severe)   - MILD (1-3): doesn't interfere with normal activities    - MODERATE (4-7): interferes with normal activities or awakens from sleep    - SEVERE (8-10): excruciating pain, unable to do any normal activities        Moderate but can do normal activities  5. RECURRENT SYMPTOM: "Have you ever had headaches before?" If Yes, ask: "When was the last time?" and "What happened that time?"      Yes 6. CAUSE: "What do you think is causing the headache?"     Sinus headache  7. MIGRAINE: "Have you been diagnosed with migraine headaches?" If Yes, ask: "Is  this headache similar?"      na 8. HEAD INJURY: "Has there been any recent injury to the head?"      na 9. OTHER SYMPTOMS: "Do you have any other symptoms?" (fever, stiff neck, eye pain, sore throat, cold symptoms)     Cold symptoms . Nose stopped difficulty breathing  blows nose for green mucus, dizziness at times. Feeling tired  10. PREGNANCY: "Is there any chance you are pregnant?" "When was your last menstrual period?"       na  Protocols used: [facility]

## 2022-05-12 NOTE — Assessment & Plan Note (Addendum)
Pt afebrile, pt currently completely course of ciprofloxacin.  Pt advised to continue abx therapy and to communicate with clinic if symptoms do not improve.

## 2022-05-12 NOTE — Assessment & Plan Note (Signed)
Treating pt for acute sinusitis with methylprednisolone taper. Pt advised he can take his  zyrtec with mucinex and take mucinex twice/day.

## 2022-05-12 NOTE — Progress Notes (Signed)
Results discussed with patient during visit.

## 2022-05-12 NOTE — Assessment & Plan Note (Addendum)
Strep and COVID swabs completed in clinic. Rapid strep negative. Will notify pt of COVID results.

## 2022-05-13 ENCOUNTER — Encounter: Payer: Self-pay | Admitting: Nurse Practitioner

## 2022-05-13 LAB — NOVEL CORONAVIRUS, NAA: SARS-CoV-2, NAA: NOT DETECTED

## 2022-05-13 NOTE — Progress Notes (Signed)
Please let patient know his COVID test was negative.

## 2022-05-15 LAB — VERITOR FLU A/B WAIVED
Influenza A: NEGATIVE
Influenza B: NEGATIVE

## 2022-05-15 LAB — CULTURE, GROUP A STREP: Strep A Culture: NEGATIVE

## 2022-05-15 LAB — RAPID STREP SCREEN (MED CTR MEBANE ONLY): Strep Gp A Ag, IA W/Reflex: NEGATIVE

## 2022-05-19 ENCOUNTER — Encounter: Payer: Self-pay | Admitting: Nurse Practitioner

## 2022-05-19 MED ORDER — AMOXICILLIN 500 MG PO CAPS: 500.0000 mg | ORAL_CAPSULE | Freq: Two times a day (BID) | ORAL | 0 refills | Status: AC

## 2022-06-04 ENCOUNTER — Ambulatory Visit (INDEPENDENT_AMBULATORY_CARE_PROVIDER_SITE_OTHER): Payer: PPO | Admitting: Nurse Practitioner

## 2022-06-04 ENCOUNTER — Ambulatory Visit: Payer: Self-pay | Admitting: *Deleted

## 2022-06-04 ENCOUNTER — Ambulatory Visit: Payer: Self-pay

## 2022-06-04 ENCOUNTER — Encounter: Payer: Self-pay | Admitting: Nurse Practitioner

## 2022-06-04 VITALS — BP 125/81 | HR 71 | Temp 97.6°F | Wt 267.4 lb

## 2022-06-04 DIAGNOSIS — R8281 Pyuria: Secondary | ICD-10-CM | POA: Diagnosis not present

## 2022-06-04 DIAGNOSIS — N451 Epididymitis: Secondary | ICD-10-CM

## 2022-06-04 DIAGNOSIS — N50812 Left testicular pain: Secondary | ICD-10-CM | POA: Diagnosis not present

## 2022-06-04 DIAGNOSIS — N50811 Right testicular pain: Secondary | ICD-10-CM | POA: Diagnosis not present

## 2022-06-04 LAB — MICROSCOPIC EXAMINATION: Bacteria, UA: NONE SEEN

## 2022-06-04 LAB — URINALYSIS, ROUTINE W REFLEX MICROSCOPIC
Bilirubin, UA: NEGATIVE
Glucose, UA: NEGATIVE
Ketones, UA: NEGATIVE
Nitrite, UA: NEGATIVE
RBC, UA: NEGATIVE
Specific Gravity, UA: 1.025 (ref 1.005–1.030)
Urobilinogen, Ur: 0.2 mg/dL (ref 0.2–1.0)
pH, UA: 5.5 (ref 5.0–7.5)

## 2022-06-04 MED ORDER — SULFAMETHOXAZOLE-TRIMETHOPRIM 800-160 MG PO TABS: 1.0000 | ORAL_TABLET | Freq: Two times a day (BID) | ORAL | 0 refills | Status: AC

## 2022-06-04 NOTE — Assessment & Plan Note (Signed)
Bilaterally R>L tenderness with symptoms present.  UA trace PRT and 2 + LEUKS, will send for culture as higher risk for infection with his self cath.  At this time start Bactrim 1 tablet BID for 10 days, recommend increased hydration at home.  No masses palpated.  Adjust regimen as needed.  Return as scheduled in November.

## 2022-06-04 NOTE — Telephone Encounter (Signed)
Patient called, left VM to return the call to the office to discuss symptoms with a nurse.  Summary: maxillary sinusitis not resolved Advice   Pt is calling to report a headache associated with Sinuses. Pt was seen in office on 05/12/22 for maxillary sinusitis. Pt reports that this has not resolved. When advised that agent was going to send a message to the nurse. Pt insisted on an appt being scheduled for tenderness in testicles reporting that the 2 are not related. Apt was scheduled today at 4pm.

## 2022-06-04 NOTE — Progress Notes (Signed)
Acute Office Visit  Subjective:     Patient ID: Derrick Febus., male    DOB: 1956-06-18, 66 y.o.   MRN: [id number]  Chief Complaint  Patient presents with   Testicle Pain    Pt states he has been having some tenderness in his testicles for the last few weeks. States he doesn't feel the discomfort constantly but feels it when he has to touch his testicles or when changing clothes     Started with testicular pain at the beginning of week, R>L.  Prior to pain he had not been heavy lifting, but had been out doing yard week [employer] community) although this typically causes no issues.  Had intercourse the day prior to pain starting, no semen came out during this.  Has history of prostate surgery 5-6 years ago he reports.  Follows by urology, with last visit February 2023.  Is to use catheter 3 times a day to empty bladder, does this 2 times a day.  Testicle Pain The patient's primary symptoms include testicular pain. The patient's pertinent negatives include no genital injury, genital itching, genital lesions, penile discharge, penile pain or scrotal swelling. This is a new problem. The current episode started in the past 7 days. The problem occurs intermittently. The problem has been unchanged. The pain is mild. Associated symptoms include abdominal pain, dysuria (occasional), frequency and urgency. Pertinent negatives include no anorexia, chills, constipation, diarrhea, discolored urine, fever, hematuria, joint swelling, nausea, painful intercourse, shortness of breath, urinary retention or vomiting. The testicular pain affects both testicles. The symptoms are aggravated by tactile pressure. He has tried nothing for the symptoms. He is sexually active. He never uses condoms. No, his partner does not have an STD. His past medical history is significant for BPH and prostatitis (in past). There is no history of chlamydia, erectile aid use, gonorrhea, HIV, kidney stones or syphilis.   Patient is in today  for testicle pain.  Review of Systems  Constitutional:  Negative for chills and fever.  Respiratory:  Negative for shortness of breath.   Gastrointestinal:  Positive for abdominal pain. Negative for anorexia, constipation, diarrhea, nausea and vomiting.  Genitourinary:  Positive for dysuria (occasional), frequency, testicular pain and urgency. Negative for penile discharge, penile pain and scrotal swelling.      Objective:    BP 125/81   Pulse 71   Temp 97.6 F (36.4 C) (Oral)   Wt 267 lb 6.4 oz (121.3 kg)   SpO2 95%   BMI 41.26 kg/m  BP Readings from Last 3 Encounters:  06/04/22 125/81  05/12/22 122/85  04/09/22 132/71   Wt Readings from Last 3 Encounters:  06/04/22 267 lb 6.4 oz (121.3 kg)  05/12/22 262 lb 14.4 oz (119.3 kg)  04/09/22 264 lb 12.8 oz (120.1 kg)   Physical Exam Vitals and nursing note reviewed. Exam conducted with a chaperone present.  Constitutional:      General: He is awake. He is not in acute distress.    Appearance: He is well-developed and well-groomed. He is obese. He is not ill-appearing or toxic-appearing.  HENT:     Head: Normocephalic and atraumatic.     Right Ear: Hearing normal. No drainage.     Left Ear: Hearing normal. No drainage.  Eyes:     General: Lids are normal.        Right eye: No discharge.        Left eye: No discharge.     Conjunctiva/sclera: Conjunctivae normal.  Pupils: Pupils are equal, round, and reactive to light.  Neck:     Thyroid: No thyromegaly.     Vascular: No carotid bruit.  Cardiovascular:     Rate and Rhythm: Normal rate and regular rhythm.     Heart sounds: Normal heart sounds, S1 normal and S2 normal. No murmur heard.    No gallop.  Pulmonary:     Effort: Pulmonary effort is normal. No accessory muscle usage or respiratory distress.     Breath sounds: Normal breath sounds.  Abdominal:     General: Bowel sounds are normal.     Palpations: Abdomen is soft.     Hernia: There is no hernia in the left  inguinal area or right inguinal area.  Genitourinary:    Penis: Normal.      Testes:        Right: Mass, tenderness or swelling not present. Cremasteric reflex is present.         Left: Mass, tenderness or swelling not present. Cremasteric reflex is present.      Epididymis:     Right: Not enlarged. Tenderness (right more tender then left) present. No mass.     Left: Not enlarged. Tenderness present. No mass.  Musculoskeletal:        General: Normal range of motion.     Cervical back: Normal range of motion and neck supple.     Right lower leg: No edema.     Left lower leg: No edema.  Lymphadenopathy:     Cervical: No cervical adenopathy.  Skin:    General: Skin is warm and dry.     Capillary Refill: Capillary refill takes less than 2 seconds.  Neurological:     Mental Status: He is alert and oriented to person, place, and time.  Psychiatric:        Attention and Perception: Attention normal.        Mood and Affect: Mood normal.        Speech: Speech normal.        Behavior: Behavior normal. Behavior is cooperative.        Thought Content: Thought content normal.       Assessment & Plan:   Problem List Items Addressed This Visit       Genitourinary   Epididymitis, bilateral - Primary    Bilaterally R>L tenderness with symptoms present.  UA trace PRT and 2 + LEUKS, will send for culture as higher risk for infection with his self cath.  At this time start Bactrim 1 tablet BID for 10 days, recommend increased hydration at home.  No masses palpated.  Adjust regimen as needed.  Return as scheduled in November.      Relevant Orders   Urinalysis, Routine w reflex microscopic   Other Visit Diagnoses     Pyuria       Urine sent for culture to further assess.   Relevant Orders   Urine Culture       Meds ordered this encounter  Medications   sulfamethoxazole-trimethoprim (BACTRIM DS) 800-160 MG tablet    Sig: Take 1 tablet by mouth 2 (two) times daily for 10 days.     Dispense:  20 tablet    Refill:  0    Return for as scheduled in November.  Venita Lick, NP

## 2022-06-04 NOTE — Telephone Encounter (Signed)
Reason for Disposition  [1] Pain comes and goes (intermittent) AND [2] present > 24 hours  Answer Assessment - Initial Assessment Questions 1. LOCATION and RADIATION: "Where is the pain located?"      Tender in his testicles.   And in his back. 2. QUALITY: "What does the pain feel like?"  (e.g., sharp, dull, aching, burning)     Having tenderness in both of his testicles and his back.   His sinuses are also congested.   The headaches have resolved. 3. SEVERITY: "How bad is the pain?"  (Scale 1-10; or mild, moderate, severe)   - MILD (1-3): doesn't interfere with normal activities    - MODERATE (4-7): interferes with normal activities (e.g., work or school) or awakens from sleep   - SEVERE (8-10): excruciating pain, unable to do any normal activities, difficulty walking     Mild tenderness in both testicles 4. ONSET: "When did the pain start?"     Not asked.   He has an appt. This afternoon at 4:00 and our connection was not good.   (He was driving) so no further triage done 5. PATTERN: "Does it come and go, or has it been constant since it started?"     Not asked 6. SCROTAL APPEARANCE: "What does the scrotum look like?" "Is there any swelling or redness?"      Not asked 7. HERNIA: "Has a doctor ever told you that you have a hernia?"     N/A 8. OTHER SYMPTOMS: "Do you have any other symptoms?" (e.g., fever, abdominal pain, vomiting, difficulty passing urine)     Sinus congestion and back pain  Protocols used: Scrotal Pain-A-AH

## 2022-06-04 NOTE — Telephone Encounter (Signed)
See triage notes.   Did not see this note in queue so a separate triage call done regarding the tenderness in his testicles, back pain and continued sinus congestion.   Has an appt. For today at 4:00 with Marnee Guarneri, NP.

## 2022-06-04 NOTE — Patient Instructions (Signed)
Epididymitis  Epididymitis is inflammation or swelling of the epididymis. This is caused by an infection. The epididymis is a cord-like structure that is located along the top and back part of the testicle. It collects and stores sperm from the testicle. This condition can also cause pain and swelling of the testicle and scrotum. Symptoms usually start suddenly (acute epididymitis). Sometimes epididymitis starts gradually and lasts for a while (chronic epididymitis). Chronic epididymitis may be harder to treat. What are the causes? In men ages 20-40, this condition is usually caused by a bacterial infection or a sexually transmitted infection (STI), such as gonorrhea or chlamydia. In men 40 and older, this condition is usually caused by bacteria from a urinary blockage or from abnormalities in the urinary system. These can result from: Having a tube placed into the bladder (urinary catheter). Having an enlarged or inflamed prostate gland. Having recently had urinary tract surgery. Having a problem with a backward flow of urine (retrograde). In men who have a condition that weakens the body's defense system (immune system), such as human immunodeficiency virus (HIV), this condition can be caused by: Other bacteria, including tuberculosis and syphilis. Viruses. Fungi. Sometimes this condition occurs without infection. This may happen because of trauma or repetitive activities such as sports. What increases the risk? You are more likely to develop this condition if you have: Unprotected sex with more than one partner. Anal sex. Had recent surgery. A urinary catheter. Urinary problems. A suppressed immune system. What are the signs or symptoms? This condition usually begins suddenly with chills, fever, and pain behind the scrotum and in the testicle. Other symptoms include: Swelling of the scrotum, testicle, or both. Pain when ejaculating or urinating. Pain in the back or  abdomen. Nausea. Itching and discharge from the penis. A frequent need to pass urine. Redness, increased warmth, and tenderness of the scrotum. How is this diagnosed? Your health care provider can diagnose this condition based on your symptoms and medical history. Your health care provider will also do a physical exam to check your scrotum and testicle for swelling, pain, and redness. You may also have other tests, including: Testing of discharge from the penis. Testing your urine for infections, such as STIs. Ultrasound to check for blood flow and inflammation. Your health care provider may test you for other STIs, including HIV. How is this treated? Treatment for this condition depends on the cause. If your condition is caused by a bacterial infection, oral antibiotic medicine may be prescribed. If the bacterial infection has spread to your blood, you may need to receive IV antibiotics. For both bacterial and nonbacterial epididymitis, you may be treated with: Rest. Elevation of the scrotum. Pain medicines. Anti-inflammatory medicines. Surgery may be needed if: You have pus buildup in the scrotum (abscess). You have epididymitis that has not responded to other treatments. Follow these instructions at home: Medicines Take over-the-counter and prescription medicines only as told by your health care provider. If you were prescribed an antibiotic medicine, take it as told by your health care provider. Do not stop taking the antibiotic even if your condition improves. Sexual activity If your epididymitis was caused by an STI, avoid sexual activity until your treatment is complete. Inform your sexual partner or partners if you test positive for an STI. They may need to be treated. Do not engage in sexual activity with your partner or partners until their treatment is completed. Managing pain and swelling  If directed, raise (elevate) your scrotum and apply ice.   To do this: Put ice in a  plastic bag. Place a small towel or pillow between your legs. Rest your scrotum on the pillow or towel. Place another towel between your skin and the plastic bag. Leave the ice on for 20 minutes, 2-3 times a day. Remove the ice if your skin turns bright red. This is very important. If you cannot feel pain, heat, or cold, you have a greater risk of damage to the area. Keep your scrotum elevated and supported while resting. Ask your health care provider if you should wear a scrotal support, such as a jockstrap. Wear it as told by your health care provider. Try taking a sitz bath to help with discomfort. This is a warm water bath that is taken while you are sitting down. The water should come up to your hips and should cover your buttocks. Do this 3-4 times per day or as told by your health care provider. General instructions Drink enough fluid to keep your urine pale yellow. Return to your normal activities as told by your health care provider. Ask your health care provider what activities are safe for you. Keep all follow-up visits. This is important. Contact a health care provider if: You have a fever. Your pain medicine is not helping. Your pain is getting worse. Your symptoms do not improve within 3 days. Summary Epididymitis is inflammation or swelling of the epididymis. This is caused by an infection. This condition can also cause pain and swelling of the testicle and scrotum. Treatment for this condition depends on the cause. If your condition is caused by a bacterial infection, oral antibiotic medicine may be prescribed. Inform your sexual partner or partners if you test positive for an STI. They may need to be treated. Do not engage in sexual activity with your partner or partners until their treatment is completed. Contact a health care provider if your symptoms do not improve within 3 days. This information is not intended to replace advice given to you by your health care provider.  Make sure you discuss any questions you have with your health care provider. Document Revised: 03/11/2021 Document Reviewed: 03/11/2021 Elsevier Patient Education  2023 [employer]  

## 2022-06-04 NOTE — Telephone Encounter (Signed)
  Chief Complaint: Tenderness in both testicles, back pain and sinus congestion Symptoms: above Frequency: The sinus congestion is ongoing but the sinus headaches have resolved Pertinent Negatives: Patient denies injuries or accidents involving his testicles Disposition: '[]'$ ED /'[]'$ Urgent Care (no appt availability in office) / '[x]'$ Appointment(In office/virtual)/ '[]'$  [facility]/ '[]'$ Home Care/ '[]'$ Refused Recommended Disposition /'[]'$ [facility]/ '[]'$  Follow-up with PCP Additional Notes: Has an appt. Today at 4:00 with Marnee Guarneri, NP   We had a bad connection, he was driving, so our conversation was cutting in and out.   A complete triage wasn't done due to that and the fact he has an appt. This afternoon at 4:00.

## 2022-06-06 LAB — URINE CULTURE

## 2022-06-06 NOTE — Progress Notes (Signed)
Contacted via MyChart   No infection in urine noted:)

## 2022-06-21 ENCOUNTER — Ambulatory Visit (INDEPENDENT_AMBULATORY_CARE_PROVIDER_SITE_OTHER): Payer: PPO | Admitting: *Deleted

## 2022-06-21 DIAGNOSIS — Z0000 Encounter for general adult medical examination without abnormal findings: Secondary | ICD-10-CM | POA: Diagnosis not present

## 2022-06-21 NOTE — Patient Instructions (Signed)
Derrick Sandoval , Thank you for taking time to come for your Medicare Wellness Visit. I appreciate your ongoing commitment to your health goals. Please review the following plan we discussed and let me know if I can assist you in the future.   These are the goals we discussed:  Goals      DIET - INCREASE WATER INTAKE     Recommend drinking at least 6-8 glasses of water a day      Manage Pain-Osteoarthritis     Timeframe:  Long-Range Goal Priority:  Medium Start Date:                             Expected End Date:                       Follow Up Date 3 month follow up    - prioritize tasks for the day - use ice or heat for pain relief - work slower and less intense when having pain    Why is this important?   Day-to-day life can be hard when you have joint pain.  Pain medicine is just one piece of the treatment puzzle. There are many things you can do to manage pain and stay strong.   Lifestyle changes like stopping smoking and eating foods with Vitamin D and calcium keeps your bones and muscles healthy. Your joints are better when supported by strong muscles.  You can try these action steps to help you manage your pain.     Notes:      Patient Stated     06/16/2020, get back to the gym     Patient Stated     06/19/2021, no goals     Patient Stated     No goals     Track and Manage My Blood Pressure-Hypertension     Timeframe:  Long-Range Goal Priority:  Medium Start Date:                             Expected End Date:                       Follow Up Date 3 month follow up   - check blood pressure 3 times per week    Why is this important?   You won't feel high blood pressure, but it can still hurt your blood vessels.  High blood pressure can cause heart or kidney problems. It can also cause a stroke.  Making lifestyle changes like losing a little weight or eating less salt will help.  Checking your blood pressure at home and at different times of the day can help to control  blood pressure.  If the doctor prescribes medicine remember to take it the way the doctor ordered.  Call the office if you cannot afford the medicine or if there are questions about it.     Notes:         This is a list of the screening recommended for you and due dates:  Health Maintenance  Topic Date Due   COVID-19 Vaccine (6 - Mixed Product risk series) 06/18/2021   Medicare Annual Wellness Visit  06/22/2023   Tetanus Vaccine  10/09/2027   Colon Cancer Screening  05/12/2028   Flu Shot  Completed   Hepatitis C Screening: USPSTF Recommendation to screen - Ages 18-79 yo.  Completed   HIV Screening  Completed   Zoster (Shingles) Vaccine  Completed   HPV Vaccine  Aged Out    Advanced directives: Education provided  Conditions/risks identified:   Next appointment: Follow up in one year for your annual wellness visit.  07-13-2022 @ 1:00  [facility] [facility] 65 Years and Older, Male  Preventive care refers to lifestyle choices and visits with your health care provider that can promote health and wellness. What does preventive care include? A yearly physical exam. This is also called an annual well check. Dental exams once or twice a year. Routine eye exams. Ask your health care provider how often you should have your eyes checked. Personal lifestyle choices, including: Daily care of your teeth and gums. Regular physical activity. Eating a healthy diet. Avoiding tobacco and drug use. Limiting alcohol use. Practicing safe sex. Taking low doses of aspirin every day. Taking vitamin and mineral supplements as recommended by your health care provider. What happens during an annual well check? The services and screenings done by your health care provider during your annual well check will depend on your age, overall health, lifestyle risk factors, and family history of disease. Counseling  Your health care provider may ask you questions about your: Alcohol use. Tobacco  use. Drug use. Emotional well-being. Home and relationship well-being. Sexual activity. Eating habits. History of falls. Memory and ability to understand (cognition). Work and work Statistician. Screening  You may have the following tests or measurements: Height, weight, and BMI. Blood pressure. Lipid and cholesterol levels. These may be checked every 5 years, or more frequently if you are over 63 years old. Skin check. Lung cancer screening. You may have this screening every year starting at age 32 if you have a 30-pack-year history of smoking and currently smoke or have quit within the past 15 years. Fecal occult blood test (FOBT) of the stool. You may have this test every year starting at age 21. Flexible sigmoidoscopy or colonoscopy. You may have a sigmoidoscopy every 5 years or a colonoscopy every 10 years starting at age 104. Prostate cancer screening. Recommendations will vary depending on your family history and other risks. Hepatitis C blood test. Hepatitis B blood test. Sexually transmitted disease (STD) testing. Diabetes screening. This is done by checking your blood sugar (glucose) after you have not eaten for a while (fasting). You may have this done every 1-3 years. Abdominal aortic aneurysm (AAA) screening. You may need this if you are a current or former smoker. Osteoporosis. You may be screened starting at age 58 if you are at high risk. Talk with your health care provider about your test results, treatment options, and if necessary, the need for more tests. Vaccines  Your health care provider may recommend certain vaccines, such as: Influenza vaccine. This is recommended every year. Tetanus, diphtheria, and acellular pertussis (Tdap, Td) vaccine. You may need a Td booster every 10 years. Zoster vaccine. You may need this after age 62. Pneumococcal 13-valent conjugate (PCV13) vaccine. One dose is recommended after age 69. Pneumococcal polysaccharide (PPSV23) vaccine.  One dose is recommended after age 54. Talk to your health care provider about which screenings and vaccines you need and how often you need them. This information is not intended to replace advice given to you by your health care provider. Make sure you discuss any questions you have with your health care provider. Document Released: 08/29/2015 Document Revised: 04/21/2016 Document Reviewed: 06/03/2015 Elsevier Interactive Patient Education  2017 [employer].  Fall Prevention in the Home Falls can cause injuries. They can happen to people of all ages. There are many things you can do to make your home safe and to help prevent falls. What can I do on the outside of my home? Regularly fix the edges of walkways and driveways and fix any cracks. Remove anything that might make you trip as you walk through a door, such as a raised step or threshold. Trim any bushes or trees on the path to your home. Use bright outdoor lighting. Clear any walking paths of anything that might make someone trip, such as rocks or tools. Regularly check to see if handrails are loose or broken. Make sure that both sides of any steps have handrails. Any raised decks and porches should have guardrails on the edges. Have any leaves, snow, or ice cleared regularly. Use sand or salt on walking paths during winter. Clean up any spills in your garage right away. This includes oil or grease spills. What can I do in the bathroom? Use night lights. Install grab bars by the toilet and in the tub and shower. Do not use towel bars as grab bars. Use non-skid mats or decals in the tub or shower. If you need to sit down in the shower, use a plastic, non-slip stool. Keep the floor dry. Clean up any water that spills on the floor as soon as it happens. Remove soap buildup in the tub or shower regularly. Attach bath mats securely with double-sided non-slip rug tape. Do not have throw rugs and other things on the floor that can make  you trip. What can I do in the bedroom? Use night lights. Make sure that you have a light by your bed that is easy to reach. Do not use any sheets or blankets that are too big for your bed. They should not hang down onto the floor. Have a firm chair that has side arms. You can use this for support while you get dressed. Do not have throw rugs and other things on the floor that can make you trip. What can I do in the kitchen? Clean up any spills right away. Avoid walking on wet floors. Keep items that you use a lot in easy-to-reach places. If you need to reach something above you, use a strong step stool that has a grab bar. Keep electrical cords out of the way. Do not use floor polish or wax that makes floors slippery. If you must use wax, use non-skid floor wax. Do not have throw rugs and other things on the floor that can make you trip. What can I do with my stairs? Do not leave any items on the stairs. Make sure that there are handrails on both sides of the stairs and use them. Fix handrails that are broken or loose. Make sure that handrails are as long as the stairways. Check any carpeting to make sure that it is firmly attached to the stairs. Fix any carpet that is loose or worn. Avoid having throw rugs at the top or bottom of the stairs. If you do have throw rugs, attach them to the floor with carpet tape. Make sure that you have a light switch at the top of the stairs and the bottom of the stairs. If you do not have them, ask someone to add them for you. What else can I do to help prevent falls? Wear shoes that: Do not have high heels. Have rubber bottoms. Are comfortable and fit you  well. Are closed at the toe. Do not wear sandals. If you use a stepladder: Make sure that it is fully opened. Do not climb a closed stepladder. Make sure that both sides of the stepladder are locked into place. Ask someone to hold it for you, if possible. Clearly mark and make sure that you can  see: Any grab bars or handrails. First and last steps. Where the edge of each step is. Use tools that help you move around (mobility aids) if they are needed. These include: Canes. Walkers. Scooters. Crutches. Turn on the lights when you go into a dark area. Replace any light bulbs as soon as they burn out. Set up your furniture so you have a clear path. Avoid moving your furniture around. If any of your floors are uneven, fix them. If there are any pets around you, be aware of where they are. Review your medicines with your doctor. Some medicines can make you feel dizzy. This can increase your chance of falling. Ask your doctor what other things that you can do to help prevent falls. This information is not intended to replace advice given to you by your health care provider. Make sure you discuss any questions you have with your health care provider. Document Released: 05/29/2009 Document Revised: 01/08/2016 Document Reviewed: 09/06/2014 Elsevier Interactive Patient Education  2017 [employer].

## 2022-06-21 NOTE — Progress Notes (Signed)
Subjective:   Derrick Sandoval. is a 66 y.o. male who presents for Medicare Annual/Subsequent preventive examination.  I connected with  [name] on 06/21/22 by a telephone enabled telemedicine application and verified that I am speaking with the correct person using two identifiers.   I discussed the limitations of evaluation and management by telemedicine. The patient expressed understanding and agreed to proceed.  Patient location: home  Provider location: Tele-health-home    Review of Systems     Cardiac Risk Factors include: advanced age (>57mn, >>64women);male gender;obesity (BMI >30kg/m2);sedentary lifestyle;hypertension     Objective:    Today's Vitals   There is no height or weight on file to calculate BMI.     06/21/2022    9:07 AM 06/19/2021    9:01 AM 06/16/2020    9:50 AM 06/06/2019    2:39 PM 06/01/2018    9:24 AM 05/29/2018    1:15 PM 05/12/2018    9:09 AM  Advanced Directives  Does Patient Have a Medical Advance Directive? No No No No Yes No No  Would patient like information on creating a medical advance directive?      No - Patient declined     Current Medications (verified) Outpatient Encounter Medications as of 06/21/2022  Medication Sig   acyclovir (ZOVIRAX) 400 MG tablet Take 1 tablet (400 mg total) by mouth 2 (two) times daily.   albuterol (VENTOLIN HFA) 108 (90 Base) MCG/ACT inhaler Inhale 1-2 puffs into the lungs every 6 (six) hours as needed for wheezing or shortness of breath.   Ascorbic Acid (VITAMIN C) 1000 MG tablet Take 1,000 mg by mouth at bedtime.    aspirin EC 81 MG tablet Take 81 mg by mouth daily.   budesonide-formoterol (SYMBICORT) 160-4.5 MCG/ACT inhaler Inhale 2 puffs into the lungs 2 (two) times daily.   cetirizine (ZYRTEC) 10 MG tablet Take 1 tablet (10 mg total) by mouth daily.   cholecalciferol (VITAMIN D) 1000 UNITS tablet Take 1,000 Units by mouth daily.    cyclobenzaprine (FLEXERIL) 10 MG tablet Take 1 tablet  (10 mg total) by mouth 3 (three) times daily as needed. for muscle spams   diclofenac Sodium (VOLTAREN) 1 % GEL Apply 4 g topically 4 (four) times daily.   famotidine (PEPCID) 20 MG tablet Take 1 tablet (20 mg total) by mouth 2 (two) times daily.   fluconazole (DIFLUCAN) 100 MG tablet Take 1 tablet (100 mg total) by mouth daily.   fluticasone (FLONASE) 50 MCG/ACT nasal spray Place 1 spray into both nostrils 2 (two) times daily.   guaiFENesin (MUCINEX) 600 MG 12 hr tablet Take 1 tablet (600 mg total) by mouth 2 (two) times daily.   lisinopril (ZESTRIL) 20 MG tablet Take 1 tablet (20 mg total) by mouth daily.   lubiprostone (AMITIZA) 8 MCG capsule Take 1 capsule (8 mcg total) by mouth 2 (two) times daily with a meal.   methocarbamol (ROBAXIN) 500 MG tablet Take 500 mg by mouth daily as needed.   methylPREDNISolone (MEDROL DOSEPAK) 4 MG TBPK tablet Take as directed   montelukast (SINGULAIR) 10 MG tablet Take 1 tablet (10 mg total) by mouth at bedtime.   Multiple Vitamins-Minerals (MULTIVITAMIN WITH MINERALS) tablet Take 1 tablet by mouth daily.   naproxen sodium (ALEVE) 220 MG tablet Take 220 mg by mouth daily as needed.   nystatin (MYCOSTATIN/NYSTOP) powder Apply 1 Application topically 3 (three) times daily.   nystatin cream (MYCOSTATIN) Apply 1 Application topically 2 (two)  times daily.   omeprazole (PRILOSEC) 40 MG capsule Take 1 capsule (40 mg total) by mouth daily.   sertraline (ZOLOFT) 100 MG tablet Take 1 tablet (100 mg total) by mouth daily.   tadalafil (CIALIS) 5 MG tablet TAKE 1 TABLET BY MOUTH ONCE DAILY AS NEEDED FOR BPH   triamcinolone ointment (KENALOG) 0.1 % Apply 1 application topically 2 (two) times daily.   No facility-administered encounter medications on file as of 06/21/2022.    Allergies (verified) Patient has no known allergies.   History: Past Medical History:  Diagnosis Date   Allergy    Arthritis    Cervical radiculopathy 01/16/2021   Cough    nonproductive no  fever saw at dr 05-26-18   GERD (gastroesophageal reflux disease)    Hyperlipidemia    Hypertension    Meningitis due to viruses 1968   Past Surgical History:  Procedure Laterality Date   COLONOSCOPY WITH PROPOFOL N/A 05/12/2018   Procedure: COLONOSCOPY WITH PROPOFOL;  Surgeon: Jonathon Bellows, MD;  Location: [facility] ENDOSCOPY;  Service: Gastroenterology;  Laterality: N/A;   ESOPHAGOGASTRODUODENOSCOPY (EGD) WITH PROPOFOL N/A 05/12/2018   Procedure: ESOPHAGOGASTRODUODENOSCOPY (EGD) WITH PROPOFOL;  Surgeon: Jonathon Bellows, MD;  Location: [facility] ENDOSCOPY;  Service: Gastroenterology;  Laterality: N/A;   ESOPHAGOGASTRODUODENOSCOPY (EGD) WITH PROPOFOL N/A 06/01/2018   Procedure: ESOPHAGOGASTRODUODENOSCOPY (EGD) WITH PROPOFOL;  Surgeon: Milus Banister, MD;  Location: [facility] ENDOSCOPY;  Service: Endoscopy;  Laterality: N/A;   EUS N/A 06/01/2018   Procedure: UPPER ENDOSCOPIC ULTRASOUND (EUS) RADIAL;  Surgeon: Milus Banister, MD;  Location: [facility] ENDOSCOPY;  Service: Endoscopy;  Laterality: N/A;   JOINT REPLACEMENT Bilateral    NASAL SINUS SURGERY     TOTAL KNEE ARTHROPLASTY     Family History  Problem Relation Age of Onset   Breast cancer Mother    Cancer Father        unsure, passed away before it was confirmed   Pancreatic cancer Daughter    Diabetes Neg Hx    Heart disease Neg Hx    Hypertension Neg Hx    Stroke Neg Hx    COPD Neg Hx    Colon cancer Neg Hx    Social History   Socioeconomic History   Marital status: Married    Spouse name: Not on file   Number of children: Not on file   Years of education: 10th grade, GED   Highest education level: GED or equivalent  Occupational History   Occupation: disabled   Tobacco Use   Smoking status: Never    Passive exposure: Never   Smokeless tobacco: Never  Vaping Use   Vaping Use: Never used  Substance and Sexual Activity   Alcohol use: Not Currently    Alcohol/week: 0.0 standard drinks of alcohol    Comment: occasionally, twice a year     Drug use: No   Sexual activity: Not Currently  Other Topics Concern   Not on file  Social History Narrative   [employer] 3x a week    Social Determinants of Health   Financial Resource Strain: Low Risk  (06/21/2022)   Overall Financial Resource Strain (CARDIA)    Difficulty of Paying Living Expenses: Not very hard  Recent Concern: Financial Resource Strain - High Risk (04/12/2022)   Overall Financial Resource Strain (CARDIA)    Difficulty of Paying Living Expenses: Hard  Food Insecurity: No Food Insecurity (06/21/2022)   Hunger Vital Sign    Worried About Running Out of Food in the Last Year: Never true  Ran Out of Food in the Last Year: Never true  Transportation Needs: No Transportation Needs (06/21/2022)   PRAPARE - Hydrologist (Medical): No    Lack of Transportation (Non-Medical): No  Physical Activity: Inactive (06/21/2022)   Exercise Vital Sign    Days of Exercise per Week: 0 days    Minutes of Exercise per Session: 0 min  Stress: No Stress Concern Present (06/21/2022)   [place]    Feeling of Stress : Not at all  Social Connections: Socially Isolated (06/21/2022)   Social Connection and Isolation Panel [NHANES]    Frequency of Communication with Friends and Family: Once a week    Frequency of Social Gatherings with Friends and Family: Never    Attends Religious Services: Never    Marine scientist or Organizations: No    Attends Music therapist: Never    Marital Status: Married    Tobacco Counseling Counseling given: Not Answered   Clinical Intake:  Pre-visit preparation completed: Yes  Pain : No/denies pain        How often do you need to have someone help you when you read instructions, pamphlets, or other written materials from your doctor or pharmacy?: 1 - Never  Diabetic?  no  Interpreter Needed?: No  Information entered by :: Leroy Kennedy LPN   Activities of Daily Living    06/21/2022    9:10 AM  In your present state of health, do you have any difficulty performing the following activities:  Hearing? 0  Vision? 0  Difficulty concentrating or making decisions? 0  Walking or climbing stairs? 0  Dressing or bathing? 0  Doing errands, shopping? 0  Preparing Food and eating ? N  Using the Toilet? N  In the past six months, have you accidently leaked urine? N  Do you have problems with loss of bowel control? N  Managing your Medications? N  Managing your Finances? N  Housekeeping or managing your Housekeeping? N    Patient Care Team: Valerie Roys, DO as PCP - General (Family Medicine) Delgaizo, Lavon Paganini, MD as Referring Physician (Orthopedic Surgery) Viprakasit, Marlowe Shores, MD as Referring Physician (Urology) Lane Hacker, [facility] (Pharmacist)  Indicate any recent Medical Services you may have received from other than [facility] providers in the past year (date may be approximate).     Assessment:   This is a routine wellness examination for Cordon.  Hearing/Vision screen Hearing Screening - Comments:: No trouble hearing Vision Screening - Comments:: Not up to date  Dietary issues and exercise activities discussed: Current Exercise Habits: The patient does not participate in regular exercise at present   Goals Addressed             This Visit's Progress    Patient Stated       No goals       Depression Screen    06/21/2022    9:11 AM 06/04/2022    3:55 PM 05/12/2022    1:16 PM 04/09/2022    1:08 PM 12/08/2021    9:18 AM 11/10/2021    8:31 AM 10/13/2021   10:17 AM  PHQ 2/9 Scores  PHQ - 2 Score 0 '6 3 2 '$ 0 0 6  PHQ- 9 Score '9 15 14 5 2 7 15    '$ Fall Risk    06/21/2022    9:07 AM 06/04/2022    3:54 PM 05/12/2022  1:16 PM 04/09/2022    1:08 PM 10/13/2021   10:17 AM  Fall Risk   Falls in the past year? 0 0 0 0 0  Number falls in past yr: 0 0 0 0   Injury with Fall? 0 0 0 0   Risk for fall  due to :  No Fall Risks No Fall Risks No Fall Risks   Follow up Falls evaluation completed;Education provided;Falls prevention discussed Falls evaluation completed Falls evaluation completed Falls evaluation completed     FALL RISK PREVENTION PERTAINING TO THE HOME:  Any stairs in or around the home? No  If so, are there any without handrails? No  Home free of loose throw rugs in walkways, pet beds, electrical cords, etc? Yes  Adequate lighting in your home to reduce risk of falls? Yes   ASSISTIVE DEVICES UTILIZED TO PREVENT FALLS:  Life alert? No  Use of a cane, walker or w/c? No  Grab bars in the bathroom? No  Shower chair or bench in shower? No  Elevated toilet seat or a handicapped toilet? No   TIMED UP AND GO:  Was the test performed? No .    Cognitive Function:        06/21/2022    9:08 AM 06/19/2021    9:03 AM 06/16/2020    9:58 AM 04/21/2018    8:47 AM 02/22/2017    3:48 PM  6CIT Screen  What Year? 0 points 0 points 0 points 0 points 0 points  What month? 0 points 0 points 0 points 0 points 0 points  What time? 0 points 0 points 0 points 0 points 0 points  Count back from 20 0 points 0 points 0 points 0 points 0 points  Months in reverse 0 points 0 points 0 points 0 points 0 points  Repeat phrase 0 points 0 points 0 points 0 points 0 points  Total Score 0 points 0 points 0 points 0 points 0 points    Immunizations Immunization History  Administered Date(s) Administered   Influenza, High Dose Seasonal PF 04/21/2022   Influenza, Quadrivalent, Recombinant, Inj, Pf 05/29/2021   Influenza,inj,Quad PF,6+ Mos 05/12/2015, 04/29/2016, 05/31/2017, 04/21/2018, 05/04/2019   Influenza-Unspecified 07/28/2015, 05/04/2019, 05/20/2020   Moderna Sars-Covid-2 Vaccination 03/29/2020, 04/26/2020   PFIZER(Purple Top)SARS-COV-2 Vaccination 11/09/2019, 11/30/2019, 04/23/2021   Respiratory Syncytial Virus Vaccine,Recomb Aduvanted(Arexvy) 04/21/2022   Td 01/23/2016   Tdap 01/05/2012,  10/08/2017   Zoster Recombinat (Shingrix) 05/04/2019, 07/13/2019    TDAP status: Up to date  Flu Vaccine status: Up to date  Pneumococcal vaccine status: Up to date  Covid-19 vaccine status: Information provided on how to obtain vaccines.   Qualifies for Shingles Vaccine? No   Zostavax completed No   Shingrix Completed?: Yes  Screening Tests Health Maintenance  Topic Date Due   COVID-19 Vaccine (6 - Mixed Product risk series) 06/18/2021   Medicare Annual Wellness (AWV)  06/22/2023   TETANUS/TDAP  10/09/2027   COLONOSCOPY (Pts 45-57yr Insurance coverage will need to be confirmed)  05/12/2028   INFLUENZA VACCINE  Completed   Hepatitis C Screening  Completed   HIV Screening  Completed   Zoster Vaccines- Shingrix  Completed   HPV VACCINES  Aged Out    Health Maintenance  Health Maintenance Due  Topic Date Due   COVID-19 Vaccine (6 - Mixed Product risk series) 06/18/2021    Colorectal cancer screening: Type of screening: Colonoscopy. Completed 2019. Repeat every 10 years  Lung Cancer Screening: (Low Dose CT Chest recommended if  Age 55-80 years, 30 pack-year currently smoking OR have quit w/in 15years.) does not qualify.   Lung Cancer Screening Referral:   Additional Screening:  Hepatitis C Screening: does not qualify; Completed 2022  Vision Screening: Recommended annual ophthalmology exams for early detection of glaucoma and other disorders of the eye. Is the patient up to date with their annual eye exam?  Yes  Who is the provider or what is the name of the office in which the patient attends annual eye exams?  If pt is not established with a provider, would they like to be referred to a provider to establish care? No .   Dental Screening: Recommended annual dental exams for proper oral hygiene  Community Resource Referral / Chronic Care Management: CRR required this visit?  No   CCM required this visit?  No      Plan:     I have personally reviewed and  noted the following in the patient's chart:   Medical and social history Use of alcohol, tobacco or illicit drugs  Current medications and supplements including opioid prescriptions. Patient is not currently taking opioid prescriptions. Functional ability and status Nutritional status Physical activity Advanced directives List of other physicians Hospitalizations, surgeries, and ER visits in previous 12 months Vitals Screenings to include cognitive, depression, and falls Referrals and appointments  In addition, I have reviewed and discussed with patient certain preventive protocols, quality metrics, and best practice recommendations. A written personalized care plan for preventive services as well as general preventive health recommendations were provided to patient.     Leroy Kennedy, LPN   24/11/100   Nurse Notes:

## 2022-07-13 ENCOUNTER — Ambulatory Visit: Payer: PPO | Admitting: Family Medicine

## 2022-07-13 ENCOUNTER — Ambulatory Visit (INDEPENDENT_AMBULATORY_CARE_PROVIDER_SITE_OTHER): Payer: PPO | Admitting: Physician Assistant

## 2022-07-13 ENCOUNTER — Encounter: Payer: Self-pay | Admitting: Physician Assistant

## 2022-07-13 VITALS — BP 112/75 | HR 70 | Temp 98.4°F | Wt 272.8 lb

## 2022-07-13 DIAGNOSIS — R454 Irritability and anger: Secondary | ICD-10-CM

## 2022-07-13 DIAGNOSIS — J309 Allergic rhinitis, unspecified: Secondary | ICD-10-CM | POA: Diagnosis not present

## 2022-07-13 MED ORDER — RYALTRIS 665-25 MCG/ACT NA SUSP: 2.0000 | Freq: Two times a day (BID) | NASAL | 1 refills | Status: AC

## 2022-07-13 NOTE — Progress Notes (Unsigned)
Established Patient Office Visit  Name: Derrick Sandoval.   MRN: [id number]    DOB: July 26, 1956   Date:07/15/2022  Today's Provider: Jacquelin Hawking, MHS, PA-C Introduced myself to the patient as a PA-C and provided education on APPs in clinical practice.         Subjective  Chief Complaint  Chief Complaint  Patient presents with   Irritability    Patient was increased to 100mg  sertraline 3 months ago. Per patient he still feels irritable and tired all the time.    Headache    Patient states he has been having daily headaches for a few weeks, has tried OTC medication with no relief.     Headache  Associated symptoms include dizziness. Pertinent negatives include no blurred vision, fever, nausea, photophobia or vomiting.    Irritability  States he feels moody and irritable  Reports he is sleeping "all the time"  Reports this has been going on for some time  Reports he was feeling better while on the steroid about a month ago  Reports headaches have not been helping his mood  States he did not notice much of a difference in mood when his Zoloft was increased to 100 mg PO QD      07/13/2022    2:02 PM 06/21/2022    9:11 AM 06/04/2022    3:55 PM 05/12/2022    1:16 PM 04/09/2022    1:08 PM  Depression screen PHQ 2/9  Decreased Interest 3 0 3 3 1   Down, Depressed, Hopeless 3 0 3 0 1  PHQ - 2 Score 6 0 6 3 2   Altered sleeping 0 3 3 3 1   Tired, decreased energy 3 3 3 3 1   Change in appetite 3 0 3 3 1   Feeling bad or failure about yourself  0 0 0 0 0  Trouble concentrating 0 3 0 2 0  Moving slowly or fidgety/restless 0 0 0 0 0  Suicidal thoughts 0 0 0 0 0  PHQ-9 Score 12 9 15 14 5   Difficult doing work/chores Somewhat difficult Not difficult at all Somewhat difficult Somewhat difficult       07/13/2022    2:03 PM 06/04/2022    3:55 PM 05/12/2022    1:16 PM 04/09/2022    1:08 PM  GAD 7 : Generalized Anxiety Score  Nervous, Anxious, on Edge 2 3 0 0  Control/stop  worrying 1 0 0 0  Worry too much - different things 0 0 0 0  Trouble relaxing 0 0 0 0  Restless 0 0 0 0  Easily annoyed or irritable 3 3 0 1  Afraid - awful might happen 0 0 0 0  Total GAD 7 Score 6 6 0 1  Anxiety Difficulty Somewhat difficult Somewhat difficult Not difficult at all       Headaches  Onset: gradual Duration: started last week  Pain level and character: 6-7/10, reports it is a numb and tingling feeling Location: temples and sinuses  Associated symptoms: reduced appetite, fatigue  Denies vision changes, photophobia, sound senstitivity, pain along occipital region, or pain in neck or shoulders   Aura: denies visual aura or prodrome  Alleviating: Aleve yesterday , sneezing  Aggravating: Interventions: Ibuprofen, Bayer, Tylenol   States he has been taking Zyrtec, Flonase and restarted his Singulair yesterday  States he does a Nettipot rinse 3 times per day and after he mows the lawn  Patient Active Problem List   Diagnosis Date Noted   Epididymitis, bilateral 06/04/2022   Irritability 10/13/2021   Recurrent sinusitis 09/24/2021   Cervical radiculopathy 01/16/2021   Cervical spondylosis 09/16/2020   Tension headache 09/18/2019   Genital herpes 01/23/2016   Skin lesion of left arm 08/16/2015   Short-term memory loss 08/04/2015   Dysphagia 05/12/2015   Vitamin D deficiency 05/12/2015   GERD (gastroesophageal reflux disease) 01/29/2015   OA (osteoarthritis) of knee 01/29/2015   Fatty liver 01/28/2015   Left ventricular hypertrophy 01/28/2015   Morbid obesity (HCC) 01/28/2015   Rhinitis, allergic 01/28/2015   Elevated transaminase level 01/28/2015   Hyperlipidemia 01/28/2015   Benign hypertension 01/28/2015   Benign prostatic hyperplasia with lower urinary tract symptoms 01/28/2015    Past Surgical History:  Procedure Laterality Date   COLONOSCOPY WITH PROPOFOL N/A 05/12/2018   Procedure: COLONOSCOPY WITH PROPOFOL;  Surgeon: Wyline Mood, MD;   Location[facility] ENDOSCOPY;  Service: Gastroenterology;  Laterality: N/A;   ESOPHAGOGASTRODUODENOSCOPY (EGD) WITH PROPOFOL N/A 05/12/2018   Procedure: ESOPHAGOGASTRODUODENOSCOPY (EGD) WITH PROPOFOL;  Surgeon: Wyline Mood, MD;  Location: [facility] ENDOSCOPY;  Service: Gastroenterology;  Laterality: N/A;   ESOPHAGOGASTRODUODENOSCOPY (EGD) WITH PROPOFOL N/A 06/01/2018   Procedure: ESOPHAGOGASTRODUODENOSCOPY (EGD) WITH PROPOFOL;  Surgeon: Rachael Fee, MD;  Location: [facility] ENDOSCOPY;  Service: Endoscopy;  Laterality: N/A;   EUS N/A 06/01/2018   Procedure: UPPER ENDOSCOPIC ULTRASOUND (EUS) RADIAL;  Surgeon: Rachael Fee, MD;  Location: [facility] ENDOSCOPY;  Service: Endoscopy;  Laterality: N/A;   JOINT REPLACEMENT Bilateral    NASAL SINUS SURGERY     TOTAL KNEE ARTHROPLASTY      Family History  Problem Relation Age of Onset   Breast cancer Mother    Cancer Father        unsure, passed away before it was confirmed   Pancreatic cancer Daughter    Diabetes Neg Hx    Heart disease Neg Hx    Hypertension Neg Hx    Stroke Neg Hx    COPD Neg Hx    Colon cancer Neg Hx     Social History   Tobacco Use   Smoking status: Never    Passive exposure: Never   Smokeless tobacco: Never  Substance Use Topics   Alcohol use: Not Currently    Alcohol/week: 0.0 standard drinks of alcohol    Comment: occasionally, twice a year      Current Outpatient Medications:    acyclovir (ZOVIRAX) 400 MG tablet, Take 1 tablet (400 mg total) by mouth 2 (two) times daily., Disp: 180 tablet, Rfl: 1   albuterol (VENTOLIN HFA) 108 (90 Base) MCG/ACT inhaler, Inhale 1-2 puffs into the lungs every 6 (six) hours as needed for wheezing or shortness of breath., Disp: 18 g, Rfl: 3   Ascorbic Acid (VITAMIN C) 1000 MG tablet, Take 1,000 mg by mouth at bedtime. , Disp: , Rfl:    aspirin EC 81 MG tablet, Take 81 mg by mouth daily., Disp: , Rfl:    budesonide-formoterol (SYMBICORT) 160-4.5 MCG/ACT inhaler, Inhale 2 puffs into the lungs 2  (two) times daily., Disp: 3 each, Rfl: 3   cetirizine (ZYRTEC) 10 MG tablet, Take 1 tablet (10 mg total) by mouth daily., Disp: 90 tablet, Rfl: 3   cholecalciferol (VITAMIN D) 1000 UNITS tablet, Take 1,000 Units by mouth daily. , Disp: , Rfl:    cyclobenzaprine (FLEXERIL) 10 MG tablet, Take 1 tablet (10 mg total) by mouth 3 (three) times daily as needed. for muscle spams,  Disp: 30 tablet, Rfl: 3   diclofenac Sodium (VOLTAREN) 1 % GEL, Apply 4 g topically 4 (four) times daily., Disp: 350 g, Rfl: 12   famotidine (PEPCID) 20 MG tablet, Take 1 tablet (20 mg total) by mouth 2 (two) times daily., Disp: 180 tablet, Rfl: 1   fluconazole (DIFLUCAN) 100 MG tablet, Take 1 tablet (100 mg total) by mouth daily., Disp: 7 tablet, Rfl: 0   guaiFENesin (MUCINEX) 600 MG 12 hr tablet, Take 1 tablet (600 mg total) by mouth 2 (two) times daily., Disp: 10 tablet, Rfl: 0   lisinopril (ZESTRIL) 20 MG tablet, Take 1 tablet (20 mg total) by mouth daily., Disp: 90 tablet, Rfl: 1   lubiprostone (AMITIZA) 8 MCG capsule, Take 1 capsule (8 mcg total) by mouth 2 (two) times daily with a meal., Disp: 180 capsule, Rfl: 1   methocarbamol (ROBAXIN) 500 MG tablet, Take 500 mg by mouth daily as needed., Disp: , Rfl:    montelukast (SINGULAIR) 10 MG tablet, Take 1 tablet (10 mg total) by mouth at bedtime., Disp: 90 tablet, Rfl: 1   Multiple Vitamins-Minerals (MULTIVITAMIN WITH MINERALS) tablet, Take 1 tablet by mouth daily., Disp: , Rfl:    naproxen sodium (ALEVE) 220 MG tablet, Take 220 mg by mouth daily as needed., Disp: , Rfl:    nystatin (MYCOSTATIN/NYSTOP) powder, Apply 1 Application topically 3 (three) times daily., Disp: 15 g, Rfl: 0   nystatin cream (MYCOSTATIN), Apply 1 Application topically 2 (two) times daily., Disp: 30 g, Rfl: 3   Olopatadine-Mometasone (RYALTRIS) 665-25 MCG/ACT SUSP, Place 2 sprays into the nose in the morning and at bedtime., Disp: 29 g, Rfl: 1   omeprazole (PRILOSEC) 40 MG capsule, Take 1 capsule (40 mg  total) by mouth daily., Disp: 90 capsule, Rfl: 1   sertraline (ZOLOFT) 100 MG tablet, Take 1 tablet (100 mg total) by mouth daily., Disp: 90 tablet, Rfl: 1   tadalafil (CIALIS) 5 MG tablet, TAKE 1 TABLET BY MOUTH ONCE DAILY AS NEEDED FOR BPH, Disp: 90 tablet, Rfl: 3   triamcinolone ointment (KENALOG) 0.1 %, Apply 1 application topically 2 (two) times daily., Disp: , Rfl:   No Known Allergies  I personally reviewed active problem list, medication list, allergies, notes from last encounter with the patient/caregiver today.   Review of Systems  Constitutional:  Positive for chills and malaise/fatigue. Negative for fever.  Eyes:  Negative for blurred vision, double vision and photophobia.  Gastrointestinal:  Negative for nausea and vomiting.  Neurological:  Positive for dizziness and headaches.      Objective  Vitals:   07/13/22 1357  BP: 112/75  Pulse: 70  Temp: 98.4 F (36.9 C)  SpO2: 96%  Weight: 272 lb 12.8 oz (123.7 kg)    Body mass index is 42.1 kg/m.  Physical Exam Vitals reviewed.  Constitutional:      Appearance: He is well-developed.  HENT:     Head: Normocephalic and atraumatic.  Eyes:     Extraocular Movements: Extraocular movements intact.     Pupils: Pupils are equal, round, and reactive to light.  Pulmonary:     Effort: Pulmonary effort is normal.  Musculoskeletal:        General: Normal range of motion.  Skin:    General: Skin is warm.  Neurological:     Mental Status: He is alert and oriented to person, place, and time.     GCS: GCS eye subscore is 4. GCS verbal subscore is 5. GCS motor subscore is 6.  Cranial Nerves: No dysarthria or facial asymmetry.  Psychiatric:        Attention and Perception: Attention normal.        Mood and Affect: Mood normal. Affect is blunt and flat.        Speech: Speech normal.        Behavior: Behavior normal. Behavior is cooperative.      Recent Results (from the past 2160 hour(s))  Rapid Strep  screen([facility])     Status: None   Collection Time: 05/12/22  1:17 PM   Specimen: Other   Other  Result Value Ref Range   Strep Gp A Ag, IA W/Reflex Negative Negative  Veritor Flu A/B Waived     Status: None   Collection Time: 05/12/22  1:17 PM  Result Value Ref Range   Influenza A Negative Negative   Influenza B Negative Negative    Comment: If the test is negative for the presence of influenza A or influenza B antigen, infection due to influenza cannot be ruled-out because the antigen present in the sample may be below the detection limit of the test. It is recommended that these results be confirmed by viral culture or an FDA-cleared influenza A and B molecular assay.   Culture, Group A Strep     Status: None   Collection Time: 05/12/22  1:17 PM   Other  Result Value Ref Range   Strep A Culture Negative     Comment:                             Reference Range: Negative  Novel Coronavirus, NAA ([facility])     Status: None   Collection Time: 05/12/22  1:25 PM   Specimen: Nasopharyngeal(NP) swabs in vial transport medium  Result Value Ref Range   SARS-CoV-2, NAA Not Detected Not Detected    Comment: This nucleic acid amplification test was developed and its performance characteristics determined by [employer]. Nucleic acid amplification tests include RT-PCR and TMA. This test has not been FDA cleared or approved. This test has been authorized by FDA under an Emergency Use Authorization (EUA). This test is only authorized for the duration of time the declaration that circumstances exist justifying the authorization of the emergency use of in vitro diagnostic tests for detection of SARS-CoV-2 virus and/or diagnosis of COVID-19 infection under section 564(b)(1) of the Act, 21 U.S.C. 147WGN-5(A) (1), unless the authorization is terminated or revoked sooner. When diagnostic testing is negative, the possibility of a false negative result should be considered in the  context of a patient's recent exposures and the presence of clinical signs and symptoms consistent with COVID-19. An individual without symptoms of COVID-19 and who is not shedding SARS-CoV-2 virus wo uld expect to have a negative (not detected) result in this assay.   Urinalysis, Routine w reflex microscopic     Status: Abnormal   Collection Time: 06/04/22  3:53 PM  Result Value Ref Range   Specific Gravity, UA 1.025 1.005 - 1.030   pH, UA 5.5 5.0 - 7.5   Color, UA Yellow Yellow   Appearance Ur Cloudy (A) Clear   Leukocytes,UA 2+ (A) Negative   Protein,UA Trace (A) Negative/Trace   Glucose, UA Negative Negative   Ketones, UA Negative Negative   RBC, UA Negative Negative   Bilirubin, UA Negative Negative   Urobilinogen, Ur 0.2 0.2 - 1.0 mg/dL   Nitrite, UA Negative Negative   Microscopic Examination See  below:   Microscopic Examination     Status: None   Collection Time: 06/04/22  3:53 PM   Urine  Result Value Ref Range   WBC, UA >30W 0 - 5 /hpf   RBC, Urine 0-2 0 - 2 /hpf   Epithelial Cells (non renal) 0-10 0 - 10 /hpf   Bacteria, UA None seen None seen/Few  Urine Culture     Status: None   Collection Time: 06/04/22  4:40 PM   Specimen: Urine   UR  Result Value Ref Range   Urine Culture, Routine Final report    Organism ID, Bacteria Comment     Comment: Culture shows less than 10,000 colony forming units of bacteria per milliliter of urine. This colony count is not generally considered to be clinically significant.      PHQ2/9:    07/13/2022    2:02 PM 06/21/2022    9:11 AM 06/04/2022    3:55 PM 05/12/2022    1:16 PM 04/09/2022    1:08 PM  Depression screen PHQ 2/9  Decreased Interest 3 0 3 3 1   Down, Depressed, Hopeless 3 0 3 0 1  PHQ - 2 Score 6 0 6 3 2   Altered sleeping 0 3 3 3 1   Tired, decreased energy 3 3 3 3 1   Change in appetite 3 0 3 3 1   Feeling bad or failure about yourself  0 0 0 0 0  Trouble concentrating 0 3 0 2 0  Moving slowly or  fidgety/restless 0 0 0 0 0  Suicidal thoughts 0 0 0 0 0  PHQ-9 Score 12 9 15 14 5   Difficult doing work/chores Somewhat difficult Not difficult at all Somewhat difficult Somewhat difficult       Fall Risk:    06/21/2022    9:07 AM 06/04/2022    3:54 PM 05/12/2022    1:16 PM 04/09/2022    1:08 PM 10/13/2021   10:17 AM  Fall Risk   Falls in the past year? 0 0 0 0 0  Number falls in past yr: 0 0 0 0   Injury with Fall? 0 0 0 0   Risk for fall due to :  No Fall Risks No Fall Risks No Fall Risks   Follow up Falls evaluation completed;Education provided;Falls prevention discussed Falls evaluation completed Falls evaluation completed Falls evaluation completed       Functional Status Survey:      Assessment & Plan  Problem List Items Addressed This Visit       Respiratory   Rhinitis, allergic    Chronic, recurrent concern Patient currently takes Zyrtec, flonase and restarted Singulair yesterday but symptoms are still prevalent and causing sinus headaches Recommend stopping Flonase and will add Ryaltris instead to assist with headaches  Suspect this will assist with headache relief by reducing sinus pressure and pain Provided savings information from manufacturer to assist with cost Continue with Zyrtec and Singulair  Follow up as needed       Relevant Medications   Olopatadine-Mometasone (RYALTRIS) 665-25 MCG/ACT SUSP     Other   Irritability - Primary    Chronic, ongoing Suspect this may be related to persistent sinus headaches and pressure as most of his concerns center around these symptoms today rather than mood  Will provide Ryaltris instead of Flonase to assist with symptoms  Recommend he continue with Sertraline at this time  Follow up in 6 weeks to assess response to Ryaltris and if this has  improved irritability  If not may need to increase sertraline dose again         Return in about 6 weeks (around 08/24/2022) for Irritability and sinus headaches, rhinitis  follow up .   I, Zareth Rippetoe E Donnielle Addison, PA-C, have reviewed all documentation for this visit. The documentation on 07/15/22 for the exam, diagnosis, procedures, and orders are all accurate and complete.   Jacquelin Hawking, MHS, PA-C [facility]

## 2022-07-13 NOTE — Patient Instructions (Addendum)
RYALTRIST - Nasal Spray  Savings and Support   Please use the  link above for savings and cost assistance for the Ryaltris   Please stop your Flonase and use this instead for about 2-4 weeks to see if that improves your sinus congestion and headaches   Please continue taking your Montelukast and Zyrtec daily

## 2022-07-15 NOTE — Assessment & Plan Note (Signed)
Chronic, ongoing Suspect this may be related to persistent sinus headaches and pressure as most of his concerns center around these symptoms today rather than mood  Will provide Ryaltris instead of Flonase to assist with symptoms  Recommend he continue with Sertraline at this time  Follow up in 6 weeks to assess response to Ryaltris and if this has improved irritability  If not may need to increase sertraline dose again

## 2022-07-15 NOTE — Assessment & Plan Note (Signed)
Chronic, recurrent concern Patient currently takes Zyrtec, flonase and restarted Singulair yesterday but symptoms are still prevalent and causing sinus headaches Recommend stopping Flonase and will add Ryaltris instead to assist with headaches  Suspect this will assist with headache relief by reducing sinus pressure and pain Provided savings information from manufacturer to assist with cost Continue with Zyrtec and Singulair  Follow up as needed

## 2022-07-22 ENCOUNTER — Telehealth: Payer: Self-pay | Admitting: Family Medicine

## 2022-07-22 NOTE — Telephone Encounter (Signed)
The office received a Help at Hand Patient assistance Application in the mail for Provider to complete. Application was placed in the providers folder.

## 2022-07-23 NOTE — Telephone Encounter (Signed)
Error

## 2022-08-10 ENCOUNTER — Encounter: Payer: Self-pay | Admitting: Family Medicine

## 2022-08-11 ENCOUNTER — Encounter: Payer: Self-pay | Admitting: Family Medicine

## 2022-08-11 MED ORDER — FLUTICASONE PROPIONATE 50 MCG/ACT NA SUSP: 2.0000 | Freq: Every day | NASAL | 12 refills | Status: DC

## 2022-08-24 ENCOUNTER — Ambulatory Visit (INDEPENDENT_AMBULATORY_CARE_PROVIDER_SITE_OTHER): Payer: PPO | Admitting: Family Medicine

## 2022-08-24 ENCOUNTER — Encounter: Payer: Self-pay | Admitting: Family Medicine

## 2022-08-24 DIAGNOSIS — J329 Chronic sinusitis, unspecified: Secondary | ICD-10-CM

## 2022-08-24 DIAGNOSIS — R454 Irritability and anger: Secondary | ICD-10-CM | POA: Diagnosis not present

## 2022-08-24 NOTE — Assessment & Plan Note (Signed)
Acting up. Encouraged him to call his ENT. Call with any concerns.

## 2022-08-24 NOTE — Assessment & Plan Note (Signed)
No better. He still feels like this is due to his sinuses. Will get him in with ENT for further evaluation. Continue '50mg'$  of sertraline for now- may need to increase/change meds.

## 2022-08-24 NOTE — Assessment & Plan Note (Signed)
Encouraged diet and exercise with goal of losing 1-2lbs per week.

## 2022-08-24 NOTE — Progress Notes (Signed)
BP 121/83   Pulse 76   Temp 98.4 F (36.9 C) (Oral)   Ht 5' 7.5" (1.715 m)   Wt 273 lb 6.4 oz (124 kg)   SpO2 96%   BMI 42.19 kg/m    Subjective:    Patient ID: Derrick Ball., male    DOB: 1955-12-17, 67 y.o.   MRN: [id number]  HPI: Derrick Rogerson. is a 67 y.o. male  Chief Complaint  Patient presents with   Irritability    Patient says he feels about the same still having fatigue, headaches and irritable (moody).   IRRITABILITY- Not feeling any different. Still feels like his sinuses are the main issue messing with his mood. He has not reached out to his ENT. He has cut his sertraline in 1/2 because he didn't feel like it was helping.  Duration: chronic Status:stable Anxious mood: yes  Excessive worrying: no Irritability: yes  Sweating: no Nausea: no Palpitations:no Hyperventilation: no Panic attacks: no Agoraphobia: no  Obscessions/compulsions: no Depressed mood: yes    08/24/2022    9:13 AM 07/13/2022    2:02 PM 06/21/2022    9:11 AM 06/04/2022    3:55 PM 05/12/2022    1:16 PM  Depression screen PHQ 2/9  Decreased Interest 3 3 0 3 3  Down, Depressed, Hopeless 1 3 0 3 0  PHQ - 2 Score 4 6 0 6 3  Altered sleeping 3 0 '3 3 3  '$ Tired, decreased energy '2 3 3 3 3  '$ Change in appetite 2 3 0 3 3  Feeling bad or failure about yourself  0 0 0 0 0  Trouble concentrating 1 0 3 0 2  Moving slowly or fidgety/restless 0 0 0 0 0  Suicidal thoughts 0 0 0 0 0  PHQ-9 Score '12 12 9 15 14  '$ Difficult doing work/chores Somewhat difficult Somewhat difficult Not difficult at all Somewhat difficult Somewhat difficult      08/24/2022    9:13 AM 07/13/2022    2:03 PM 06/04/2022    3:55 PM 05/12/2022    1:16 PM  GAD 7 : Generalized Anxiety Score  Nervous, Anxious, on Edge '1 2 3 '$ 0  Control/stop worrying 1 1 0 0  Worry too much - different things 1 0 0 0  Trouble relaxing 1 0 0 0  Restless 1 0 0 0  Easily annoyed or irritable '2 3 3 '$ 0  Afraid - awful might happen 0 0 0 0   Total GAD 7 Score '7 6 6 '$ 0  Anxiety Difficulty Very difficult Somewhat difficult Somewhat difficult Not difficult at all   Anhedonia: no Weight changes: no Insomnia: no   Hypersomnia: yes Fatigue/loss of energy: yes Feelings of worthlessness: no Feelings of guilt: no Impaired concentration/indecisiveness: no Suicidal ideations: no  Crying spells: no Recent Stressors/Life Changes: no   Relationship problems: no   Family stress: no     Financial stress: no    Job stress: no    Recent death/loss: no  Relevant past medical, surgical, family and social history reviewed and updated as indicated. Interim medical history since our last visit reviewed. Allergies and medications reviewed and updated.  Review of Systems  Constitutional: Negative.   HENT:  Positive for congestion, rhinorrhea, sinus pressure and sinus pain. Negative for dental problem, drooling, ear discharge, ear pain, facial swelling, hearing loss, mouth sores, nosebleeds, postnasal drip, sneezing, sore throat, tinnitus, trouble swallowing and voice change.   Respiratory: Negative.  Cardiovascular: Negative.   Gastrointestinal: Negative.   Musculoskeletal: Negative.   Neurological: Negative.   Psychiatric/Behavioral:  Positive for dysphoric mood. Negative for agitation, behavioral problems, confusion, decreased concentration, hallucinations, self-injury, sleep disturbance and suicidal ideas. The patient is nervous/anxious. The patient is not hyperactive.     Per HPI unless specifically indicated above     Objective:    BP 121/83   Pulse 76   Temp 98.4 F (36.9 C) (Oral)   Ht 5' 7.5" (1.715 m)   Wt 273 lb 6.4 oz (124 kg)   SpO2 96%   BMI 42.19 kg/m   Wt Readings from Last 3 Encounters:  08/24/22 273 lb 6.4 oz (124 kg)  07/13/22 272 lb 12.8 oz (123.7 kg)  06/04/22 267 lb 6.4 oz (121.3 kg)    Physical Exam Vitals and nursing note reviewed.  Constitutional:      General: He is not in acute distress.     Appearance: Normal appearance. He is not ill-appearing, toxic-appearing or diaphoretic.  HENT:     Head: Normocephalic and atraumatic.     Right Ear: External ear normal.     Left Ear: External ear normal.     Nose: Nose normal.     Mouth/Throat:     Mouth: Mucous membranes are moist.     Pharynx: Oropharynx is clear.  Eyes:     General: No scleral icterus.       Right eye: No discharge.        Left eye: No discharge.     Extraocular Movements: Extraocular movements intact.     Conjunctiva/sclera: Conjunctivae normal.     Pupils: Pupils are equal, round, and reactive to light.  Cardiovascular:     Rate and Rhythm: Normal rate and regular rhythm.     Pulses: Normal pulses.     Heart sounds: Normal heart sounds. No murmur heard.    No friction rub. No gallop.  Pulmonary:     Effort: Pulmonary effort is normal. No respiratory distress.     Breath sounds: Normal breath sounds. No stridor. No wheezing, rhonchi or rales.  Chest:     Chest wall: No tenderness.  Musculoskeletal:        General: Normal range of motion.     Cervical back: Normal range of motion and neck supple.  Skin:    General: Skin is warm and dry.     Capillary Refill: Capillary refill takes less than 2 seconds.     Coloration: Skin is not jaundiced or pale.     Findings: No bruising, erythema, lesion or rash.  Neurological:     General: No focal deficit present.     Mental Status: He is alert and oriented to person, place, and time. Mental status is at baseline.  Psychiatric:        Mood and Affect: Mood normal.        Behavior: Behavior normal.        Thought Content: Thought content normal.        Judgment: Judgment normal.     Results for orders placed or performed in visit on 06/04/22  Urine Culture   Specimen: Urine   UR  Result Value Ref Range   Urine Culture, Routine Final report    Organism ID, Bacteria Comment   Microscopic Examination   Urine  Result Value Ref Range   WBC, UA >30W 0 - 5  /hpf   RBC, Urine 0-2 0 - 2 /hpf   Epithelial Cells (  non renal) 0-10 0 - 10 /hpf   Bacteria, UA None seen None seen/Few  Urinalysis, Routine w reflex microscopic  Result Value Ref Range   Specific Gravity, UA 1.025 1.005 - 1.030   pH, UA 5.5 5.0 - 7.5   Color, UA Yellow Yellow   Appearance Ur Cloudy (A) Clear   Leukocytes,UA 2+ (A) Negative   Protein,UA Trace (A) Negative/Trace   Glucose, UA Negative Negative   Ketones, UA Negative Negative   RBC, UA Negative Negative   Bilirubin, UA Negative Negative   Urobilinogen, Ur 0.2 0.2 - 1.0 mg/dL   Nitrite, UA Negative Negative   Microscopic Examination See below:       Assessment & Plan:   Problem List Items Addressed This Visit       Respiratory   Recurrent sinusitis    Acting up. Encouraged him to call his ENT. Call with any concerns.         Other   Morbid obesity ([place]) - Primary    Encouraged diet and exercise with goal of losing 1-2lbs per week.       Irritability    No better. He still feels like this is due to his sinuses. Will get him in with ENT for further evaluation. Continue '50mg'$  of sertraline for now- may need to increase/change meds.        Follow up plan: Return after 10/13/22 for physical.

## 2022-08-25 NOTE — Progress Notes (Signed)
LVM asking patient to call back to schedule her follow up

## 2022-09-15 DIAGNOSIS — J329 Chronic sinusitis, unspecified: Secondary | ICD-10-CM | POA: Diagnosis not present

## 2022-09-15 DIAGNOSIS — H9313 Tinnitus, bilateral: Secondary | ICD-10-CM | POA: Diagnosis not present

## 2022-09-15 DIAGNOSIS — H6983 Other specified disorders of Eustachian tube, bilateral: Secondary | ICD-10-CM | POA: Diagnosis not present

## 2022-09-15 DIAGNOSIS — J310 Chronic rhinitis: Secondary | ICD-10-CM | POA: Diagnosis not present

## 2022-09-27 ENCOUNTER — Encounter: Payer: Self-pay | Admitting: Family Medicine

## 2022-09-27 MED ORDER — CETIRIZINE HCL 10 MG PO TABS
10.0000 mg | ORAL_TABLET | Freq: Every day | ORAL | 3 refills | Status: DC
Start: 2022-09-27 — End: 2023-10-24

## 2022-10-08 ENCOUNTER — Other Ambulatory Visit: Payer: Self-pay | Admitting: *Deleted

## 2022-10-08 ENCOUNTER — Other Ambulatory Visit
Admission: RE | Admit: 2022-10-08 | Discharge: 2022-10-08 | Disposition: A | Discharge status: Home / Self Care | Payer: PPO | Attending: Urology | Admitting: Urology

## 2022-10-08 DIAGNOSIS — N138 Other obstructive and reflux uropathy: Secondary | ICD-10-CM | POA: Diagnosis not present

## 2022-10-08 DIAGNOSIS — N401 Enlarged prostate with lower urinary tract symptoms: Secondary | ICD-10-CM

## 2022-10-08 LAB — PSA: Prostatic Specific Antigen: 0.22 ng/mL (ref 0.00–4.00)

## 2022-10-08 NOTE — Progress Notes (Unsigned)
10/15/21 9:01 AM   Derrick Sandoval. Feb 24, 1956 [id number]  Referring provider:  Valerie Roys, DO Derrick Sandoval,  Derrick Sandoval [id number]  Chief Complaint  Patient presents with   Follow-up    1 year follow-up   Urological history: 1. Incomplete bladder emptying -He has a personal history of a TURP and urethral stricture.  He was being followed by [facility] urology where he is undergone urodynamics. He was scheduled to have an InterStim device placed by Dr. Westly Sandoval on 12/23/2020 and 01/06/2021 -currently cathing three times daily   2. BPH with LU TS -PSA pending -IPSS *** -PVR *** mL  -tadalafil 5 mg daily and CIC  3. Orchitis and epididymitis -had episode in 2021   4.  Asymptomatic bacteriuria - Likely colonized due to self cathing   HPI: Derrick Sandoval. is a 67 y.o.male who presents today for a 1 year follow-up.  I PSS ***  PVR ***   IPSS     Row Name 10/05/21 1000         International Prostate Symptom Score   How often have you had the sensation of not emptying your bladder? Almost always     How often have you had to urinate less than every two hours? Less than 1 in 5 times     How often have you found you stopped and started again several times when you urinated? Less than 1 in 5 times     How often have you found it difficult to postpone urination? Not at All     How often have you had a weak urinary stream? Not at All     How often have you had to strain to start urination? Almost always     How many times did you typically get up at night to urinate? 3 Times     Total IPSS Score 15       Quality of Life due to urinary symptoms   If you were to spend the rest of your life with your urinary condition just the way it is now how would you feel about that? Mixed              Score:  1-7 Mild 8-19 Moderate 20-35 Severe  SHIM ***   SHIM     Row Name 10/05/21 1035         SHIM: Over the last 6 months:   How do you rate your confidence that you  could get and keep an erection? Very High     When you had erections with sexual stimulation, how often were your erections hard enough for penetration (entering your partner)? Almost Always or Always     During sexual intercourse, how often were you able to maintain your erection after you had penetrated (entered) your partner? Almost Always or Always     During sexual intercourse, how difficult was it to maintain your erection to completion of intercourse? Not Difficult     When you attempted sexual intercourse, how often was it satisfactory for you? Almost Always or Always       SHIM Total Score   SHIM 25              PMH: Past Medical History:  Diagnosis Date   Allergy    Arthritis    Cervical radiculopathy 01/16/2021   Cough    nonproductive no fever saw at dr 05-26-18   GERD (gastroesophageal reflux disease)    Hyperlipidemia  Hypertension    Meningitis due to viruses 1968    Surgical History: Past Surgical History:  Procedure Laterality Date   COLONOSCOPY WITH PROPOFOL N/A 05/12/2018   Procedure: COLONOSCOPY WITH PROPOFOL;  Surgeon: Jonathon Bellows, MD;  Location: [facility] ENDOSCOPY;  Service: Gastroenterology;  Laterality: N/A;   ESOPHAGOGASTRODUODENOSCOPY (EGD) WITH PROPOFOL N/A 05/12/2018   Procedure: ESOPHAGOGASTRODUODENOSCOPY (EGD) WITH PROPOFOL;  Surgeon: Jonathon Bellows, MD;  Location: [facility] ENDOSCOPY;  Service: Gastroenterology;  Laterality: N/A;   ESOPHAGOGASTRODUODENOSCOPY (EGD) WITH PROPOFOL N/A 06/01/2018   Procedure: ESOPHAGOGASTRODUODENOSCOPY (EGD) WITH PROPOFOL;  Surgeon: Milus Banister, MD;  Location: [facility] ENDOSCOPY;  Service: Endoscopy;  Laterality: N/A;   EUS N/A 06/01/2018   Procedure: UPPER ENDOSCOPIC ULTRASOUND (EUS) RADIAL;  Surgeon: Milus Banister, MD;  Location: [facility] ENDOSCOPY;  Service: Endoscopy;  Laterality: N/A;   JOINT REPLACEMENT Bilateral    NASAL SINUS SURGERY     TOTAL KNEE ARTHROPLASTY      Home Medications:  Allergies as of 10/05/2021   No  Known Allergies      Medication List        Accurate as of October 05, 2021 11:59 PM. If you have any questions, ask your nurse or doctor.          STOP taking these medications    amoxicillin-clavulanate 875-125 MG tablet Commonly known as: AUGMENTIN Stopped by: Derrick Council, PA-C       TAKE these medications    acyclovir 400 MG tablet Commonly known as: ZOVIRAX Take 1 tablet (400 mg total) by mouth 2 (two) times daily.   albuterol 108 (90 Base) MCG/ACT inhaler Commonly known as: VENTOLIN HFA Inhale 1-2 puffs into the lungs every 6 (six) hours as needed for wheezing or shortness of breath.   aspirin EC 81 MG tablet Take 81 mg by mouth daily.   budesonide-formoterol 160-4.5 MCG/ACT inhaler Commonly known as: SYMBICORT Inhale 2 puffs into the lungs 2 (two) times daily.   cetirizine 10 MG tablet Commonly known as: ZYRTEC Take 1 tablet (10 mg total) by mouth daily.   cholecalciferol 1000 units tablet Commonly known as: VITAMIN D Take 1,000 Units by mouth daily.   cyclobenzaprine 10 MG tablet Commonly known as: FLEXERIL Take 1 tablet by mouth three times daily as needed for muscle spasm   famotidine 20 MG tablet Commonly known as: PEPCID Take 1 tablet (20 mg total) by mouth 2 (two) times daily.   fluticasone 50 MCG/ACT nasal spray Commonly known as: FLONASE Place 1 spray into both nostrils 2 (two) times daily.   guaiFENesin 600 MG 12 hr tablet Commonly known as: Mucinex Take 1 tablet (600 mg total) by mouth 2 (two) times daily as needed for cough or to loosen phlegm.   lisinopril 20 MG tablet Commonly known as: ZESTRIL Take 1 tablet (20 mg total) by mouth daily.   lubiprostone 8 MCG capsule Commonly known as: Amitiza Take 1 capsule (8 mcg total) by mouth 2 (two) times daily with a meal.   methocarbamol 500 MG tablet Commonly known as: ROBAXIN Take 500 mg by mouth daily as needed.   montelukast 10 MG tablet Commonly known as: SINGULAIR TAKE  1 TABLET BY MOUTH AT BEDTIME   multivitamin with minerals tablet Take 1 tablet by mouth daily.   naproxen sodium 220 MG tablet Commonly known as: ALEVE Take 220 mg by mouth daily as needed.   nystatin cream Commonly known as: MYCOSTATIN Apply 1 application topically 2 (two) times daily.   omeprazole 40 MG capsule Commonly  known as: PRILOSEC Take 1 capsule (40 mg total) by mouth daily.   tadalafil 5 MG tablet Commonly known as: CIALIS TAKE 1 TABLET BY MOUTH ONCE DAILY AS NEEDED FOR BPH   triamcinolone ointment 0.1 % Commonly known as: KENALOG Apply 1 application topically 2 (two) times daily.   vitamin C 1000 MG tablet Take 1,000 mg by mouth at bedtime.        Allergies:  No Known Allergies  Family History: Family History  Problem Relation Age of Onset   Breast cancer Mother    Cancer Father        unsure, passed away before it was confirmed   Pancreatic cancer Daughter    Diabetes Neg Hx    Heart disease Neg Hx    Hypertension Neg Hx    Stroke Neg Hx    COPD Neg Hx    Colon cancer Neg Hx     Social History:  reports that he has never smoked. He has never been exposed to tobacco smoke. He has never used smokeless tobacco. He reports that he does not currently use alcohol. He reports that he does not use drugs.   Physical Exam: BP (!) 155/98   Pulse 64   Ht '5\' 8"'$  (1.727 m)   Wt 283 lb (128.4 kg)   BMI 43.03 kg/m   Constitutional:  Well nourished. Alert and oriented, No acute distress. HEENT: [place] AT, moist mucus membranes.  Trachea midline Cardiovascular: No clubbing, cyanosis, or edema. Respiratory: Normal respiratory effort, no increased work of breathing. GU: No CVA tenderness.  No bladder fullness or masses.  Patient with circumcised/uncircumcised phallus. ***Foreskin easily retracted***  Urethral meatus is patent.  No penile discharge. No penile lesions or rashes. Scrotum without lesions, cysts, rashes and/or edema.  Testicles are located scrotally  bilaterally. No masses are appreciated in the testicles. Left and right epididymis are normal. Rectal: Patient with  normal sphincter tone. Anus and perineum without scarring or rashes. No rectal masses are appreciated. Prostate is approximately *** grams, *** nodules are appreciated. Seminal vesicles are normal. Neurologic: Grossly intact, no focal deficits, moving all 4 extremities. Psychiatric: Normal mood and affect.   Laboratory Data: Serum creatinine (03/2022) 1.06 Total cholesterol (03/2022) 159 I have reviewed the labs.   Assessment & Plan:    1. Incomplete bladder emptying - Managing with self cath x3 daily  - He was evaluated at [facility] urology and they recommended Interstim placement he has deferred that I light of recent surgeries. He is satisfied with cathing.  - PVR 152 ml  2. BPH with LUTS -PSA stable -DRE benign -UA benign -PVR < 300 cc -continue conservative management, avoiding bladder irritants and timed voiding's -Continue Cialis 5 mg daily    Return in about 1 year (around 10/05/2022) for IPSS, SHIM, PSA and exam.  Derrick Council, PA-C   [facility] [address] [place], [place] [address] [phone]

## 2022-10-11 ENCOUNTER — Ambulatory Visit (INDEPENDENT_AMBULATORY_CARE_PROVIDER_SITE_OTHER): Payer: PPO | Admitting: Urology

## 2022-10-11 ENCOUNTER — Encounter: Payer: Self-pay | Admitting: Urology

## 2022-10-11 VITALS — BP 161/83 | HR 73 | Ht 67.5 in | Wt 279.0 lb

## 2022-10-11 DIAGNOSIS — R3989 Other symptoms and signs involving the genitourinary system: Secondary | ICD-10-CM

## 2022-10-11 DIAGNOSIS — N410 Acute prostatitis: Secondary | ICD-10-CM | POA: Diagnosis not present

## 2022-10-11 DIAGNOSIS — R339 Retention of urine, unspecified: Secondary | ICD-10-CM

## 2022-10-11 DIAGNOSIS — N401 Enlarged prostate with lower urinary tract symptoms: Secondary | ICD-10-CM

## 2022-10-11 LAB — BLADDER SCAN AMB NON-IMAGING: Scan Result: 31

## 2022-10-11 MED ORDER — TAMSULOSIN HCL 0.4 MG PO CAPS: 0.4000 mg | ORAL_CAPSULE | Freq: Every day | ORAL | 3 refills | Status: DC

## 2022-10-11 MED ORDER — SULFAMETHOXAZOLE-TRIMETHOPRIM 800-160 MG PO TABS: 1.0000 | ORAL_TABLET | Freq: Two times a day (BID) | ORAL | 0 refills | Status: DC

## 2022-10-14 ENCOUNTER — Encounter: Payer: Self-pay | Admitting: Family Medicine

## 2022-10-14 ENCOUNTER — Ambulatory Visit (INDEPENDENT_AMBULATORY_CARE_PROVIDER_SITE_OTHER): Payer: PPO | Admitting: Family Medicine

## 2022-10-14 VITALS — BP 137/83 | HR 80 | Temp 98.0°F | Ht 67.5 in | Wt 278.9 lb

## 2022-10-14 DIAGNOSIS — K760 Fatty (change of) liver, not elsewhere classified: Secondary | ICD-10-CM | POA: Diagnosis not present

## 2022-10-14 DIAGNOSIS — Z0000 Encounter for general adult medical examination without abnormal findings: Secondary | ICD-10-CM

## 2022-10-14 DIAGNOSIS — E559 Vitamin D deficiency, unspecified: Secondary | ICD-10-CM | POA: Diagnosis not present

## 2022-10-14 DIAGNOSIS — R338 Other retention of urine: Secondary | ICD-10-CM

## 2022-10-14 DIAGNOSIS — N401 Enlarged prostate with lower urinary tract symptoms: Secondary | ICD-10-CM

## 2022-10-14 DIAGNOSIS — I10 Essential (primary) hypertension: Secondary | ICD-10-CM | POA: Diagnosis not present

## 2022-10-14 DIAGNOSIS — R454 Irritability and anger: Secondary | ICD-10-CM

## 2022-10-14 DIAGNOSIS — E782 Mixed hyperlipidemia: Secondary | ICD-10-CM

## 2022-10-14 DIAGNOSIS — Z23 Encounter for immunization: Secondary | ICD-10-CM

## 2022-10-14 DIAGNOSIS — R3911 Hesitancy of micturition: Secondary | ICD-10-CM | POA: Diagnosis not present

## 2022-10-14 LAB — URINALYSIS, ROUTINE W REFLEX MICROSCOPIC
Bilirubin, UA: NEGATIVE
Glucose, UA: NEGATIVE
Ketones, UA: NEGATIVE
Nitrite, UA: POSITIVE — AB
Protein,UA: NEGATIVE
RBC, UA: NEGATIVE
Specific Gravity, UA: 1.01 (ref 1.005–1.030)
Urobilinogen, Ur: 0.2 mg/dL (ref 0.2–1.0)
pH, UA: 5.5 (ref 5.0–7.5)

## 2022-10-14 LAB — MICROALBUMIN, URINE WAIVED
Creatinine, Urine Waived: 50 mg/dL (ref 10–300)
Microalb, Ur Waived: 10 mg/L (ref 0–19)

## 2022-10-14 LAB — MICROSCOPIC EXAMINATION

## 2022-10-14 MED ORDER — TADALAFIL 5 MG PO TABS: ORAL_TABLET | ORAL | 3 refills | Status: DC

## 2022-10-14 MED ORDER — FLUTICASONE PROPIONATE 50 MCG/ACT NA SUSP: 2.0000 | Freq: Every day | NASAL | 12 refills | Status: DC

## 2022-10-14 MED ORDER — LISINOPRIL 20 MG PO TABS: 20.0000 mg | ORAL_TABLET | Freq: Every day | ORAL | 1 refills | Status: DC

## 2022-10-14 MED ORDER — BUDESONIDE-FORMOTEROL FUMARATE 160-4.5 MCG/ACT IN AERO: 2.0000 | INHALATION_SPRAY | Freq: Two times a day (BID) | RESPIRATORY_TRACT | 3 refills | Status: DC

## 2022-10-14 MED ORDER — ALBUTEROL SULFATE HFA 108 (90 BASE) MCG/ACT IN AERS: 1.0000 | INHALATION_SPRAY | Freq: Four times a day (QID) | RESPIRATORY_TRACT | 3 refills | Status: DC | PRN

## 2022-10-14 MED ORDER — LUBIPROSTONE 8 MCG PO CAPS: 8.0000 ug | ORAL_CAPSULE | Freq: Two times a day (BID) | ORAL | 1 refills | Status: DC

## 2022-10-14 MED ORDER — SERTRALINE HCL 100 MG PO TABS: 100.0000 mg | ORAL_TABLET | Freq: Every day | ORAL | 1 refills | Status: DC

## 2022-10-14 MED ORDER — ACYCLOVIR 400 MG PO TABS: 400.0000 mg | ORAL_TABLET | Freq: Two times a day (BID) | ORAL | 1 refills | Status: DC

## 2022-10-14 MED ORDER — CYCLOBENZAPRINE HCL 10 MG PO TABS: 10.0000 mg | ORAL_TABLET | Freq: Three times a day (TID) | ORAL | 3 refills | Status: DC | PRN

## 2022-10-14 MED ORDER — OMEPRAZOLE 40 MG PO CPDR: 40.0000 mg | DELAYED_RELEASE_CAPSULE | Freq: Every day | ORAL | 1 refills | Status: DC

## 2022-10-14 MED ORDER — MONTELUKAST SODIUM 10 MG PO TABS: 10.0000 mg | ORAL_TABLET | Freq: Every day | ORAL | 1 refills | Status: DC

## 2022-10-14 MED ORDER — FAMOTIDINE 20 MG PO TABS: 20.0000 mg | ORAL_TABLET | Freq: Two times a day (BID) | ORAL | 1 refills | Status: DC

## 2022-10-14 MED ORDER — DICLOFENAC SODIUM 1 % EX GEL: 4.0000 g | Freq: Four times a day (QID) | CUTANEOUS | 12 refills | Status: DC

## 2022-10-14 NOTE — Progress Notes (Signed)
BP 137/83   Pulse 80   Temp 98 F (36.7 C) (Oral)   Ht 5' 7.5" (1.715 m)   Wt 278 lb 14.4 oz (126.5 kg)   SpO2 96%   BMI 43.04 kg/m    Subjective:    Patient ID: Derrick Ball., male    DOB: 03-06-1956, 67 y.o.   MRN: [id number]  HPI: Derrick Vedder. is a 67 y.o. male presenting on 10/14/2022 for comprehensive medical examination. Current medical complaints include:  IRRITABILITY Duration: chronic Status:stable Anxious mood: yes  Excessive worrying: no Irritability: no  Sweating: no Nausea: no Palpitations:no Hyperventilation: no Panic attacks: no Agoraphobia: no  Obscessions/compulsions: no Depressed mood: no    10/14/2022   10:47 AM 08/24/2022    9:13 AM 07/13/2022    2:02 PM 06/21/2022    9:11 AM 06/04/2022    3:55 PM  Depression screen PHQ 2/9  Decreased Interest '1 3 3 '$ 0 3  Down, Depressed, Hopeless 0 1 3 0 3  PHQ - 2 Score '1 4 6 '$ 0 6  Altered sleeping 0 3 0 3 3  Tired, decreased energy '1 2 3 3 3  '$ Change in appetite 0 2 3 0 3  Feeling bad or failure about yourself  0 0 0 0 0  Trouble concentrating 0 1 0 3 0  Moving slowly or fidgety/restless 0 0 0 0 0  Suicidal thoughts 0 0 0 0 0  PHQ-9 Score '2 12 12 9 15  '$ Difficult doing work/chores Somewhat difficult Somewhat difficult Somewhat difficult Not difficult at all Somewhat difficult   Anhedonia: no Weight changes: no Insomnia: no   Hypersomnia: no Fatigue/loss of energy: yes Feelings of worthlessness: no Feelings of guilt: no Impaired concentration/indecisiveness: no Suicidal ideations: no  Crying spells: no Recent Stressors/Life Changes: yes   Relationship problems: no   Family stress: no     Financial stress: no    Job stress: no    Recent death/loss: no  HYPERTENSION / HYPERLIPIDEMIA Satisfied with current treatment? yes Duration of hypertension: chronic BP monitoring frequency: not checking BP medication side effects: no Duration of hyperlipidemia: chronic Cholesterol medication side  effects: no Cholesterol supplements: none Past cholesterol medications: none Medication compliance: excellent compliance Aspirin: yes Recent stressors: no Recurrent headaches: no Visual changes: no Palpitations: no Dyspnea: no Chest pain: no Lower extremity edema: no Dizzy/lightheaded: no  Interim Problems from his last visit: no  Depression Screen done today and results listed below:     10/14/2022   10:47 AM 08/24/2022    9:13 AM 07/13/2022    2:02 PM 06/21/2022    9:11 AM 06/04/2022    3:55 PM  Depression screen PHQ 2/9  Decreased Interest '1 3 3 '$ 0 3  Down, Depressed, Hopeless 0 1 3 0 3  PHQ - 2 Score '1 4 6 '$ 0 6  Altered sleeping 0 3 0 3 3  Tired, decreased energy '1 2 3 3 3  '$ Change in appetite 0 2 3 0 3  Feeling bad or failure about yourself  0 0 0 0 0  Trouble concentrating 0 1 0 3 0  Moving slowly or fidgety/restless 0 0 0 0 0  Suicidal thoughts 0 0 0 0 0  PHQ-9 Score '2 12 12 9 15  '$ Difficult doing work/chores Somewhat difficult Somewhat difficult Somewhat difficult Not difficult at all Somewhat difficult    Past Medical History:  Past Medical History:  Diagnosis Date   Allergy    Arthritis  Cervical radiculopathy 01/16/2021   Cough    nonproductive no fever saw at dr 05-26-18   GERD (gastroesophageal reflux disease)    Hyperlipidemia    Hypertension    Meningitis due to viruses 1968    Surgical History:  Past Surgical History:  Procedure Laterality Date   COLONOSCOPY WITH PROPOFOL N/A 05/12/2018   Procedure: COLONOSCOPY WITH PROPOFOL;  Surgeon: Jonathon Bellows, MD;  Location[facility] ENDOSCOPY;  Service: Gastroenterology;  Laterality: N/A;   ESOPHAGOGASTRODUODENOSCOPY (EGD) WITH PROPOFOL N/A 05/12/2018   Procedure: ESOPHAGOGASTRODUODENOSCOPY (EGD) WITH PROPOFOL;  Surgeon: Jonathon Bellows, MD;  Location: [facility] ENDOSCOPY;  Service: Gastroenterology;  Laterality: N/A;   ESOPHAGOGASTRODUODENOSCOPY (EGD) WITH PROPOFOL N/A 06/01/2018   Procedure: ESOPHAGOGASTRODUODENOSCOPY  (EGD) WITH PROPOFOL;  Surgeon: Milus Banister, MD;  Location: [facility] ENDOSCOPY;  Service: Endoscopy;  Laterality: N/A;   EUS N/A 06/01/2018   Procedure: UPPER ENDOSCOPIC ULTRASOUND (EUS) RADIAL;  Surgeon: Milus Banister, MD;  Location: [facility] ENDOSCOPY;  Service: Endoscopy;  Laterality: N/A;   JOINT REPLACEMENT Bilateral    NASAL SINUS SURGERY     TOTAL KNEE ARTHROPLASTY      Medications:  Current Outpatient Medications on File Prior to Visit  Medication Sig   Ascorbic Acid (VITAMIN C) 1000 MG tablet Take 1,000 mg by mouth at bedtime.    aspirin EC 81 MG tablet Take 81 mg by mouth daily.   azelastine (ASTELIN) 0.1 % nasal spray SMARTSIG:1-2 Spray(s) Both Nares Twice Daily   cetirizine (ZYRTEC) 10 MG tablet Take 1 tablet (10 mg total) by mouth daily.   cholecalciferol (VITAMIN D) 1000 UNITS tablet Take 1,000 Units by mouth daily.    guaiFENesin (MUCINEX) 600 MG 12 hr tablet Take 1 tablet (600 mg total) by mouth 2 (two) times daily.   methocarbamol (ROBAXIN) 500 MG tablet Take 500 mg by mouth daily as needed.   Multiple Vitamins-Minerals (MULTIVITAMIN WITH MINERALS) tablet Take 1 tablet by mouth daily.   naproxen sodium (ALEVE) 220 MG tablet Take 220 mg by mouth daily as needed.   nystatin (MYCOSTATIN/NYSTOP) powder Apply 1 Application topically 3 (three) times daily.   nystatin cream (MYCOSTATIN) Apply 1 Application topically 2 (two) times daily.   sulfamethoxazole-trimethoprim (BACTRIM DS) 800-160 MG tablet Take 1 tablet by mouth every 12 (twelve) hours.   tamsulosin (FLOMAX) 0.4 MG CAPS capsule Take 1 capsule (0.4 mg total) by mouth daily.   triamcinolone ointment (KENALOG) 0.1 % Apply 1 application topically 2 (two) times daily.   No current facility-administered medications on file prior to visit.    Allergies:  No Known Allergies  Social History:  Social History   Socioeconomic History   Marital status: Married    Spouse name: Not on file   Number of children: Not on file    Years of education: 10th grade, GED   Highest education level: GED or equivalent  Occupational History   Occupation: disabled   Tobacco Use   Smoking status: Never    Passive exposure: Never   Smokeless tobacco: Never  Vaping Use   Vaping Use: Never used  Substance and Sexual Activity   Alcohol use: Not Currently    Alcohol/week: 0.0 standard drinks of alcohol    Comment: occasionally, twice a year    Drug use: No   Sexual activity: Not Currently  Other Topics Concern   Not on file  Social History Narrative   [employer] 3x a week    Social Determinants of Health   Financial Resource Strain: Low Risk  (  06/21/2022)   Overall Financial Resource Strain (CARDIA)    Difficulty of Paying Living Expenses: Not very hard  Recent Concern: Financial Resource Strain - High Risk (04/12/2022)   Overall Financial Resource Strain (CARDIA)    Difficulty of Paying Living Expenses: Hard  Food Insecurity: No Food Insecurity (06/21/2022)   Hunger Vital Sign    Worried About Running Out of Food in the Last Year: Never true    Ran Out of Food in the Last Year: Never true  Transportation Needs: No Transportation Needs (06/21/2022)   PRAPARE - Hydrologist (Medical): No    Lack of Transportation (Non-Medical): No  Physical Activity: Inactive (06/21/2022)   Exercise Vital Sign    Days of Exercise per Week: 0 days    Minutes of Exercise per Session: 0 min  Stress: No Stress Concern Present (06/21/2022)   [place]    Feeling of Stress : Not at all  Social Connections: Socially Isolated (06/21/2022)   Social Connection and Isolation Panel [NHANES]    Frequency of Communication with Friends and Family: Once a week    Frequency of Social Gatherings with Friends and Family: Never    Attends Religious Services: Never    Marine scientist or Organizations: No    Attends Archivist Meetings: Never     Marital Status: Married  Human resources officer Violence: Not At Risk (06/21/2022)   Humiliation, Afraid, Rape, and Kick questionnaire    Fear of Current or Ex-Partner: No    Emotionally Abused: No    Physically Abused: No    Sexually Abused: No   Social History   Tobacco Use  Smoking Status Never   Passive exposure: Never  Smokeless Tobacco Never   Social History   Substance and Sexual Activity  Alcohol Use Not Currently   Alcohol/week: 0.0 standard drinks of alcohol   Comment: occasionally, twice a year     Family History:  Family History  Problem Relation Age of Onset   Breast cancer Mother    Cancer Father        unsure, passed away before it was confirmed   Pancreatic cancer Daughter    Diabetes Neg Hx    Heart disease Neg Hx    Hypertension Neg Hx    Stroke Neg Hx    COPD Neg Hx    Colon cancer Neg Hx     Past medical history, surgical history, medications, allergies, family history and social history reviewed with patient today and changes made to appropriate areas of the chart.   Review of Systems  Constitutional: Negative.   HENT:  Positive for congestion. Negative for ear discharge, ear pain, hearing loss, nosebleeds, sinus pain, sore throat and tinnitus.   Eyes: Negative.   Respiratory: Negative.  Negative for stridor.   Cardiovascular: Negative.   Gastrointestinal:  Positive for abdominal pain. Negative for blood in stool, constipation, diarrhea, heartburn, melena, nausea and vomiting.  Genitourinary: Negative.   Musculoskeletal: Negative.   Skin: Negative.   Neurological: Negative.   Endo/Heme/Allergies: Negative.   Psychiatric/Behavioral: Negative.     All other ROS negative except what is listed above and in the HPI.      Objective:    BP 137/83   Pulse 80   Temp 98 F (36.7 C) (Oral)   Ht 5' 7.5" (1.715 m)   Wt 278 lb 14.4 oz (126.5 kg)   SpO2 96%   BMI  43.04 kg/m   Wt Readings from Last 3 Encounters:  10/14/22 278 lb 14.4 oz (126.5 kg)   10/11/22 279 lb (126.6 kg)  08/24/22 273 lb 6.4 oz (124 kg)    Physical Exam Vitals and nursing note reviewed.  Constitutional:      General: He is not in acute distress.    Appearance: Normal appearance. He is obese. He is not ill-appearing, toxic-appearing or diaphoretic.  HENT:     Head: Normocephalic and atraumatic.     Right Ear: Tympanic membrane, ear canal and external ear normal. There is no impacted cerumen.     Left Ear: Tympanic membrane, ear canal and external ear normal. There is no impacted cerumen.     Nose: Nose normal. No congestion or rhinorrhea.     Mouth/Throat:     Mouth: Mucous membranes are moist.     Pharynx: Oropharynx is clear. No oropharyngeal exudate or posterior oropharyngeal erythema.  Eyes:     General: No scleral icterus.       Right eye: No discharge.        Left eye: No discharge.     Extraocular Movements: Extraocular movements intact.     Conjunctiva/sclera: Conjunctivae normal.     Pupils: Pupils are equal, round, and reactive to light.  Neck:     Vascular: No carotid bruit.  Cardiovascular:     Rate and Rhythm: Normal rate and regular rhythm.     Pulses: Normal pulses.     Heart sounds: No murmur heard.    No friction rub. No gallop.  Pulmonary:     Effort: Pulmonary effort is normal. No respiratory distress.     Breath sounds: Normal breath sounds. No stridor. No wheezing, rhonchi or rales.  Chest:     Chest wall: No tenderness.  Abdominal:     General: Abdomen is flat. Bowel sounds are normal. There is no distension.     Palpations: Abdomen is soft. There is no mass.     Tenderness: There is no abdominal tenderness. There is no right CVA tenderness, left CVA tenderness, guarding or rebound.     Hernia: No hernia is present.  Genitourinary:    Comments: Genital exam deferred with shared decision making Musculoskeletal:        General: No swelling, tenderness, deformity or signs of injury.     Cervical back: Normal range of motion  and neck supple. No rigidity. No muscular tenderness.     Right lower leg: No edema.     Left lower leg: No edema.  Lymphadenopathy:     Cervical: No cervical adenopathy.  Skin:    General: Skin is warm and dry.     Capillary Refill: Capillary refill takes less than 2 seconds.     Coloration: Skin is not jaundiced or pale.     Findings: No bruising, erythema, lesion or rash.  Neurological:     General: No focal deficit present.     Mental Status: He is alert and oriented to person, place, and time.     Cranial Nerves: No cranial nerve deficit.     Sensory: No sensory deficit.     Motor: No weakness.     Coordination: Coordination normal.     Gait: Gait normal.     Deep Tendon Reflexes: Reflexes normal.  Psychiatric:        Mood and Affect: Mood normal.        Behavior: Behavior normal.        Thought  Content: Thought content normal.        Judgment: Judgment normal.     Results for orders placed or performed in visit on 10/14/22  Microscopic Examination   Urine  Result Value Ref Range   WBC, UA 6-10 (A) 0 - 5 /hpf   RBC, Urine 0-2 0 - 2 /hpf   Epithelial Cells (non renal) 0-10 0 - 10 /hpf   Bacteria, UA Many (A) None seen/Few  Comprehensive metabolic panel  Result Value Ref Range   Glucose 155 (H) 70 - 99 mg/dL   BUN 15 8 - 27 mg/dL   Creatinine, Ser 1.13 0.76 - 1.27 mg/dL   eGFR 72 >59 mL/min/1.73   BUN/Creatinine Ratio 13 10 - 24   Sodium 139 134 - 144 mmol/L   Potassium 4.4 3.5 - 5.2 mmol/L   Chloride 102 96 - 106 mmol/L   CO2 20 20 - 29 mmol/L   Calcium 9.7 8.6 - 10.2 mg/dL   Total Protein 7.2 6.0 - 8.5 g/dL   Albumin 4.8 3.9 - 4.9 g/dL   Globulin, Total 2.4 1.5 - 4.5 g/dL   Albumin/Globulin Ratio 2.0 1.2 - 2.2   Bilirubin Total 0.3 0.0 - 1.2 mg/dL   Alkaline Phosphatase 64 44 - 121 IU/L   AST 62 (H) 0 - 40 IU/L   ALT 73 (H) 0 - 44 IU/L  CBC with Differential/Platelet  Result Value Ref Range   WBC 6.7 3.4 - 10.8 x10E3/uL   RBC 5.17 4.14 - 5.80 x10E6/uL    Hemoglobin 16.0 13.0 - 17.7 g/dL   Hematocrit 46.6 37.5 - 51.0 %   MCV 90 79 - 97 fL   MCH 30.9 26.6 - 33.0 pg   MCHC 34.3 31.5 - 35.7 g/dL   RDW 13.6 11.6 - 15.4 %   Platelets 211 150 - 450 x10E3/uL   Neutrophils 36 Not Estab. %   Lymphs 48 Not Estab. %   Monocytes 11 Not Estab. %   Eos 4 Not Estab. %   Basos 1 Not Estab. %   Neutrophils Absolute 2.4 1.4 - 7.0 x10E3/uL   Lymphocytes Absolute 3.2 (H) 0.7 - 3.1 x10E3/uL   Monocytes Absolute 0.8 0.1 - 0.9 x10E3/uL   EOS (ABSOLUTE) 0.3 0.0 - 0.4 x10E3/uL   Basophils Absolute 0.0 0.0 - 0.2 x10E3/uL   Immature Granulocytes 0 Not Estab. %   Immature Grans (Abs) 0.0 0.0 - 0.1 x10E3/uL  Lipid Panel w/o Chol/HDL Ratio  Result Value Ref Range   Cholesterol, Total 174 100 - 199 mg/dL   Triglycerides 115 0 - 149 mg/dL   HDL 41 >39 mg/dL   VLDL Cholesterol Cal 21 5 - 40 mg/dL   LDL Chol Calc (NIH) 112 (H) 0 - 99 mg/dL  TSH  Result Value Ref Range   TSH 2.500 0.450 - 4.500 uIU/mL  Urinalysis, Routine w reflex microscopic  Result Value Ref Range   Specific Gravity, UA 1.010 1.005 - 1.030   pH, UA 5.5 5.0 - 7.5   Color, UA Yellow Yellow   Appearance Ur Cloudy (A) Clear   Leukocytes,UA 2+ (A) Negative   Protein,UA Negative Negative/Trace   Glucose, UA Negative Negative   Ketones, UA Negative Negative   RBC, UA Negative Negative   Bilirubin, UA Negative Negative   Urobilinogen, Ur 0.2 0.2 - 1.0 mg/dL   Nitrite, UA Positive (A) Negative   Microscopic Examination See below:   Microalbumin, Urine Waived  Result Value Ref Range   Microalb, Ur  Waived 10 0 - 19 mg/L   Creatinine, Urine Waived 50 10 - 300 mg/dL   Microalb/Creat Ratio 30-300 (H) <30 mg/g  VITAMIN D 25 Hydroxy (Vit-D Deficiency, Fractures)  Result Value Ref Range   Vit D, 25-Hydroxy 30.8 30.0 - 100.0 ng/mL      Assessment & Plan:   Problem List Items Addressed This Visit       Cardiovascular and Mediastinum   Benign hypertension    Under good control on current  regimen. Continue current regimen. Continue to monitor. Call with any concerns. Refills given. Labs drawn today.        Relevant Medications   lisinopril (ZESTRIL) 20 MG tablet   tadalafil (CIALIS) 5 MG tablet   Other Relevant Orders   Comprehensive metabolic panel (Completed)   CBC with Differential/Platelet (Completed)   TSH (Completed)   Urinalysis, Routine w reflex microscopic (Completed)   Microalbumin, Urine Waived (Completed)     Digestive   Fatty liver    Rechecking labs today. Work on diet and exercise. Call with any concerns.       Relevant Orders   Comprehensive metabolic panel (Completed)   CBC with Differential/Platelet (Completed)     Genitourinary   Benign prostatic hyperplasia with lower urinary tract symptoms    Under good control on current regimen. Continue current regimen. Continue to monitor. Call with any concerns. Refills given. Labs drawn today.       Relevant Orders   Comprehensive metabolic panel (Completed)   CBC with Differential/Platelet (Completed)     Other   Hyperlipidemia    Under good control on current regimen. Continue current regimen. Continue to monitor. Call with any concerns. Refills given. Labs drawn today.       Relevant Medications   lisinopril (ZESTRIL) 20 MG tablet   tadalafil (CIALIS) 5 MG tablet   Other Relevant Orders   Comprehensive metabolic panel (Completed)   CBC with Differential/Platelet (Completed)   Lipid Panel w/o Chol/HDL Ratio (Completed)   Vitamin D deficiency    Rechecking labs today. Await results. Treat as needed.       Relevant Orders   Comprehensive metabolic panel (Completed)   CBC with Differential/Platelet (Completed)   VITAMIN D 25 Hydroxy (Vit-D Deficiency, Fractures) (Completed)   Irritability    Under good control on current regimen. Continue current regimen. Continue to monitor. Call with any concerns. Refills given. Labs drawn today.       Relevant Orders   Comprehensive metabolic panel  (Completed)   CBC with Differential/Platelet (Completed)   Other Visit Diagnoses     Routine general medical examination at a health care facility    -  Primary   Vaccines up to date. Screening labs checked today. Colonoscopy up to date. Continue diet and exercise. Call with any concerns.   Need for vaccination for pneumococcus       Prevnar 20 given today.   Relevant Orders   Pneumococcal conjugate vaccine 20-valent (Prevnar 20) (Completed)        Discussed aspirin prophylaxis for myocardial infarction prevention and decision was made to continue ASA  LABORATORY TESTING:  Health maintenance labs ordered today as discussed above.   The natural history of prostate cancer and ongoing controversy regarding screening and potential treatment outcomes of prostate cancer has been discussed with the patient. The meaning of a false positive PSA and a false negative PSA has been discussed. He indicates understanding of the limitations of this screening test and wishes to proceed with  screening PSA testing.   IMMUNIZATIONS:   - Tdap: Tetanus vaccination status reviewed: last tetanus booster within 10 years. - Influenza: Up to date - Prevnar: Administered today - COVID: Up to date - HPV: Not applicable - Shingrix vaccine: Up to date  SCREENING: - Colonoscopy: Up to date  Discussed with patient purpose of the colonoscopy is to detect colon cancer at curable precancerous or early stages   PATIENT COUNSELING:    Sexuality: Discussed sexually transmitted diseases, partner selection, use of condoms, avoidance of unintended pregnancy  and contraceptive alternatives.   Advised to avoid cigarette smoking.  I discussed with the patient that most people either abstain from alcohol or drink within safe limits (<=14/week and <=4 drinks/occasion for males, <=7/weeks and <= 3 drinks/occasion for females) and that the risk for alcohol disorders and other health effects rises proportionally with the  number of drinks per week and how often a drinker exceeds daily limits.  Discussed cessation/primary prevention of drug use and availability of treatment for abuse.   Diet: Encouraged to adjust caloric intake to maintain  or achieve ideal body weight, to reduce intake of dietary saturated fat and total fat, to limit sodium intake by avoiding high sodium foods and not adding table salt, and to maintain adequate dietary potassium and calcium preferably from fresh fruits, vegetables, and low-fat dairy products.    stressed the importance of regular exercise  Injury prevention: Discussed safety belts, safety helmets, smoke detector, smoking near bedding or upholstery.   Dental health: Discussed importance of regular tooth brushing, flossing, and dental visits.   Follow up plan: NEXT PREVENTATIVE PHYSICAL DUE IN 1 YEAR. Return in about 6 months (around 04/14/2023).

## 2022-10-15 LAB — COMPREHENSIVE METABOLIC PANEL
ALT: 73 IU/L — ABNORMAL HIGH (ref 0–44)
AST: 62 IU/L — ABNORMAL HIGH (ref 0–40)
Albumin/Globulin Ratio: 2 (ref 1.2–2.2)
Albumin: 4.8 g/dL (ref 3.9–4.9)
Alkaline Phosphatase: 64 IU/L (ref 44–121)
BUN/Creatinine Ratio: 13 (ref 10–24)
BUN: 15 mg/dL (ref 8–27)
Bilirubin Total: 0.3 mg/dL (ref 0.0–1.2)
CO2: 20 mmol/L (ref 20–29)
Calcium: 9.7 mg/dL (ref 8.6–10.2)
Chloride: 102 mmol/L (ref 96–106)
Creatinine, Ser: 1.13 mg/dL (ref 0.76–1.27)
Globulin, Total: 2.4 g/dL (ref 1.5–4.5)
Glucose: 155 mg/dL — ABNORMAL HIGH (ref 70–99)
Potassium: 4.4 mmol/L (ref 3.5–5.2)
Sodium: 139 mmol/L (ref 134–144)
Total Protein: 7.2 g/dL (ref 6.0–8.5)
eGFR: 72 mL/min/{1.73_m2} (ref >59)

## 2022-10-15 LAB — CBC WITH DIFFERENTIAL/PLATELET
Basophils Absolute: 0 10*3/uL (ref 0.0–0.2)
Basos: 1 %
EOS (ABSOLUTE): 0.3 10*3/uL (ref 0.0–0.4)
Eos: 4 %
Hematocrit: 46.6 % (ref 37.5–51.0)
Hemoglobin: 16 g/dL (ref 13.0–17.7)
Immature Grans (Abs): 0 10*3/uL (ref 0.0–0.1)
Immature Granulocytes: 0 %
Lymphocytes Absolute: 3.2 10*3/uL — ABNORMAL HIGH (ref 0.7–3.1)
Lymphs: 48 %
MCH: 30.9 pg (ref 26.6–33.0)
MCHC: 34.3 g/dL (ref 31.5–35.7)
MCV: 90 fL (ref 79–97)
Monocytes Absolute: 0.8 10*3/uL (ref 0.1–0.9)
Monocytes: 11 %
Neutrophils Absolute: 2.4 10*3/uL (ref 1.4–7.0)
Neutrophils: 36 %
Platelets: 211 10*3/uL (ref 150–450)
RBC: 5.17 x10E6/uL (ref 4.14–5.80)
RDW: 13.6 % (ref 11.6–15.4)
WBC: 6.7 10*3/uL (ref 3.4–10.8)

## 2022-10-15 LAB — TSH: TSH: 2.5 u[IU]/mL (ref 0.450–4.500)

## 2022-10-15 LAB — VITAMIN D 25 HYDROXY (VIT D DEFICIENCY, FRACTURES): Vit D, 25-Hydroxy: 30.8 ng/mL (ref 30.0–100.0)

## 2022-10-15 LAB — LIPID PANEL W/O CHOL/HDL RATIO
Cholesterol, Total: 174 mg/dL (ref 100–199)
HDL: 41 mg/dL (ref >39)
LDL Chol Calc (NIH): 112 mg/dL — ABNORMAL HIGH (ref 0–99)
Triglycerides: 115 mg/dL (ref 0–149)
VLDL Cholesterol Cal: 21 mg/dL (ref 5–40)

## 2022-10-19 NOTE — Assessment & Plan Note (Signed)
Rechecking labs today. Work on diet and exercise. Call with any concerns.

## 2022-10-19 NOTE — Assessment & Plan Note (Signed)
Under good control on current regimen. Continue current regimen. Continue to monitor. Call with any concerns. Refills given. Labs drawn today.   

## 2022-10-19 NOTE — Assessment & Plan Note (Signed)
Rechecking labs today. Await results. Treat as needed.  °

## 2022-10-22 ENCOUNTER — Encounter: Payer: Self-pay | Admitting: Family Medicine

## 2022-10-25 NOTE — Telephone Encounter (Signed)
Please have him find out what's covered.

## 2022-11-04 NOTE — Progress Notes (Signed)
11/08/22 9:58 AM   Derrick Sandoval. [birth year] [id number]  Referring provider:  Valerie Roys, DO [place],  [place] [id number]  Chief Complaint  Patient presents with   Follow-up   Urological history: 1. Incomplete bladder emptying -He has a personal history of a TURP and urethral stricture.  He was being followed by [facility] urology where he is undergone urodynamics. He was scheduled to have an InterStim device placed by Dr. Westly Sandoval on 12/23/2020 and 01/06/2021 -currently cathing three times daily   2. BPH with LU TS -PSA (09/2022) 0.22 -tadalafil 5 mg daily and CIC  3. Orchitis and epididymitis -had episode in 2021   4.  Asymptomatic bacteriuria - Likely colonized due to self cathing   HPI: Derrick Sandoval. is a 67 y.o.male who presents today for a 1 one month follow up after a trial of tamsulosin for BPH with LUTS and a recheck on prostatitis.  At his visit on 10/11/2022, I PSS 17/3.  PVR 31 mL.  He has been experiencing dysuria, nausea and feeling hot and cold for the last 3 days.  He has some back pain which he contributes to constipation.  He thinks he may have a prostate infection.  He is still self cathing 2-3 times daily.  He will sometimes postpone self-catheterization in order to have an urge to urinate.  He was taking tadalafil 5 mg daily, but he discontinued the medication and his symptoms got worse.  Since he is no longer sexually active, he is wondering if he can go back on the tamsulosin to see if that was more effective.  He does continue to have issues with postvoid dribbling.  Patient denies any modifying or aggravating factors.  Patient denies any gross hematuria or suprapubic/flank pain.  Patient denies any fevers, chills, nausea or vomiting.  His prostate was indurated and tender on exam.  He was started on Septra DS twice daily x 30 days.    I PPS 9/4  PVR 147 mL   CATH UA yellow hazy, specific gravity 1.020, pH 5.5, moderate hemoglobin, large  leukocytes, greater than 50 WBCs, 11-20 RBCs and many bacteria.  He feels much better today.  With the tamsulosin he has noticed that the pulse alendronate has dramatically decreased.  The dysuria, nausea and feeling hot and cold has dissipated.  He still has some back pain which he feels is secondary to his constipation.  Patient denies any modifying or aggravating factors.  Patient denies any gross hematuria, dysuria or suprapubic/flank pain.  Patient denies any fevers, chills, nausea or vomiting.    He continues to self cath twice daily for the exception of every 3 days he will cath 3 times.    IPSS     Row Name 11/08/22 0900         International Prostate Symptom Score   How often have you had the sensation of not emptying your bladder? About half the time     How often have you had to urinate less than every two hours? Less than half the time     How often have you found you stopped and started again several times when you urinated? Not at All     How often have you found it difficult to postpone urination? Less than 1 in 5 times     How often have you had a weak urinary stream? Less than 1 in 5 times     How often have you had  to strain to start urination? Not at All     How many times did you typically get up at night to urinate? 2 Times     Total IPSS Score 9       Quality of Life due to urinary symptoms   If you were to spend the rest of your life with your urinary condition just the way it is now how would you feel about that? Mostly Disatisfied               Score:  1-7 Mild 8-19 Moderate 20-35 Severe   PMH: Past Medical History:  Diagnosis Date   Allergy    Arthritis    Cervical radiculopathy 01/16/2021   Cough    nonproductive no fever saw at dr 05-26-18   GERD (gastroesophageal reflux disease)    Hyperlipidemia    Hypertension    Meningitis due to viruses 1968    Surgical History: Past Surgical History:  Procedure Laterality Date   COLONOSCOPY  WITH PROPOFOL N/A 05/12/2018   Procedure: COLONOSCOPY WITH PROPOFOL;  Surgeon: Derrick Bellows, MD;  Location: [facility] ENDOSCOPY;  Service: Gastroenterology;  Laterality: N/A;   ESOPHAGOGASTRODUODENOSCOPY (EGD) WITH PROPOFOL N/A 05/12/2018   Procedure: ESOPHAGOGASTRODUODENOSCOPY (EGD) WITH PROPOFOL;  Surgeon: Derrick Bellows, MD;  Location: [facility] ENDOSCOPY;  Service: Gastroenterology;  Laterality: N/A;   ESOPHAGOGASTRODUODENOSCOPY (EGD) WITH PROPOFOL N/A 06/01/2018   Procedure: ESOPHAGOGASTRODUODENOSCOPY (EGD) WITH PROPOFOL;  Surgeon: Derrick Banister, MD;  Location: [facility] ENDOSCOPY;  Service: Endoscopy;  Laterality: N/A;   EUS N/A 06/01/2018   Procedure: UPPER ENDOSCOPIC ULTRASOUND (EUS) RADIAL;  Surgeon: Derrick Banister, MD;  Location: [facility] ENDOSCOPY;  Service: Endoscopy;  Laterality: N/A;   JOINT REPLACEMENT Bilateral    NASAL SINUS SURGERY     TOTAL KNEE ARTHROPLASTY      Home Medications:  Allergies as of 11/08/2022   No Known Allergies      Medication List        Accurate as of November 08, 2022  9:58 AM. If you have any questions, ask your nurse or doctor.          acyclovir 400 MG tablet Commonly known as: ZOVIRAX Take 1 tablet (400 mg total) by mouth 2 (two) times daily.   albuterol 108 (90 Base) MCG/ACT inhaler Commonly known as: VENTOLIN HFA Inhale 1-2 puffs into the lungs every 6 (six) hours as needed for wheezing or shortness of breath.   aspirin EC 81 MG tablet Take 81 mg by mouth daily.   azelastine 0.1 % nasal spray Commonly known as: ASTELIN SMARTSIG:1-2 Spray(s) Both Nares Twice Daily   budesonide-formoterol 160-4.5 MCG/ACT inhaler Commonly known as: SYMBICORT Inhale 2 puffs into the lungs 2 (two) times daily.   cetirizine 10 MG tablet Commonly known as: ZYRTEC Take 1 tablet (10 mg total) by mouth daily.   cholecalciferol 1000 units tablet Commonly known as: VITAMIN D Take 1,000 Units by mouth daily.   cyclobenzaprine 10 MG tablet Commonly known as: FLEXERIL Take  1 tablet (10 mg total) by mouth 3 (three) times daily as needed. for muscle spams   diclofenac Sodium 1 % Gel Commonly known as: Voltaren Apply 4 g topically 4 (four) times daily.   famotidine 20 MG tablet Commonly known as: PEPCID Take 1 tablet (20 mg total) by mouth 2 (two) times daily.   fluticasone 50 MCG/ACT nasal spray Commonly known as: FLONASE Place 2 sprays into both nostrils daily.   guaiFENesin 600 MG 12 hr tablet Commonly known as: Mucinex  Take 1 tablet (600 mg total) by mouth 2 (two) times daily.   lisinopril 20 MG tablet Commonly known as: ZESTRIL Take 1 tablet (20 mg total) by mouth daily.   lubiprostone 8 MCG capsule Commonly known as: Amitiza Take 1 capsule (8 mcg total) by mouth 2 (two) times daily with a meal.   methocarbamol 500 MG tablet Commonly known as: ROBAXIN Take 500 mg by mouth daily as needed.   montelukast 10 MG tablet Commonly known as: SINGULAIR Take 1 tablet (10 mg total) by mouth at bedtime.   multivitamin with minerals tablet Take 1 tablet by mouth daily.   naproxen sodium 220 MG tablet Commonly known as: ALEVE Take 220 mg by mouth daily as needed.   nystatin cream Commonly known as: MYCOSTATIN Apply 1 Application topically 2 (two) times daily.   nystatin powder Commonly known as: MYCOSTATIN/NYSTOP Apply 1 Application topically 3 (three) times daily.   omeprazole 40 MG capsule Commonly known as: PRILOSEC Take 1 capsule (40 mg total) by mouth daily.   sertraline 100 MG tablet Commonly known as: ZOLOFT Take 1 tablet (100 mg total) by mouth daily.   sulfamethoxazole-trimethoprim 800-160 MG tablet Commonly known as: BACTRIM DS Take 1 tablet by mouth every 12 (twelve) hours.   tadalafil 5 MG tablet Commonly known as: CIALIS TAKE 1 TABLET BY MOUTH ONCE DAILY AS NEEDED FOR BPH   tamsulosin 0.4 MG Caps capsule Commonly known as: FLOMAX Take 1 capsule (0.4 mg total) by mouth daily.   triamcinolone ointment 0.1 % Commonly  known as: KENALOG Apply 1 application topically 2 (two) times daily.   vitamin C 1000 MG tablet Take 1,000 mg by mouth at bedtime.        Allergies:  No Known Allergies  Family History: Family History  Problem Relation Age of Onset   Breast cancer Mother    Cancer Father        unsure, passed away before it was confirmed   Pancreatic cancer Daughter    Diabetes Neg Hx    Heart disease Neg Hx    Hypertension Neg Hx    Stroke Neg Hx    COPD Neg Hx    Colon cancer Neg Hx     Social History:  reports that he has never smoked. He has never been exposed to tobacco smoke. He has never used smokeless tobacco. He reports that he does not currently use alcohol. He reports that he does not use drugs.   Physical Exam: BP (!) 136/90   Pulse 74   Ht 5' 7.5" (1.715 m)   Wt 270 lb (122.5 kg)   BMI 41.66 kg/m   Constitutional:  Well nourished. Alert and oriented, No acute distress. HEENT: [place] AT, moist mucus membranes.  Trachea midline Cardiovascular: No clubbing, cyanosis, or edema. Respiratory: Normal respiratory effort, no increased work of breathing. Rectal: Patient with  normal sphincter tone. Anus and perineum without scarring or rashes. No rectal masses are appreciated. Prostate is approximately 35 grams, no nodules are appreciated. Seminal vesicles could not be palpated Neurologic: Grossly intact, no focal deficits, moving all 4 extremities. Psychiatric: Normal mood and affect.   Laboratory Data: Serum creatinine (09/2022) 1.13 Total cholesterol (09/2022) 174 TSH (09/2022) 2.500 I have reviewed the labs.   Pertinent imaging  11/08/22 09:43  Scan Result 147    Assessment & Plan:    1. Incomplete bladder emptying - Managing with self cath x 2-3 daily  -PVR demonstrates adequate emptying  2. BPH with LUTS -  PSA stable -DRE normal -PVR < 300 cc -continue conservative management, avoiding bladder irritants and timed voiding's -continue tamsulosin 0.4 mg daily  3.  Prostatitis -Resolved -CATH UA with micro heme, so we will send for culture  Return in about 1 year (around 11/08/2023) for IPSS, PSA, PVR and exam.  Zara Council, [place] [address], [place], [place] [address] [phone]

## 2022-11-08 ENCOUNTER — Ambulatory Visit (INDEPENDENT_AMBULATORY_CARE_PROVIDER_SITE_OTHER): Payer: PPO | Admitting: Urology

## 2022-11-08 ENCOUNTER — Other Ambulatory Visit: Payer: Self-pay | Admitting: Family Medicine

## 2022-11-08 ENCOUNTER — Other Ambulatory Visit
Admission: RE | Admit: 2022-11-08 | Discharge: 2022-11-08 | Disposition: A | Discharge status: Home / Self Care | Payer: PPO | Attending: Urology | Admitting: Urology

## 2022-11-08 ENCOUNTER — Encounter: Payer: Self-pay | Admitting: Urology

## 2022-11-08 ENCOUNTER — Telehealth: Payer: Self-pay | Admitting: Family Medicine

## 2022-11-08 VITALS — BP 136/90 | HR 74 | Ht 67.5 in | Wt 270.0 lb

## 2022-11-08 DIAGNOSIS — N138 Other obstructive and reflux uropathy: Secondary | ICD-10-CM

## 2022-11-08 DIAGNOSIS — N401 Enlarged prostate with lower urinary tract symptoms: Secondary | ICD-10-CM

## 2022-11-08 DIAGNOSIS — R339 Retention of urine, unspecified: Secondary | ICD-10-CM

## 2022-11-08 DIAGNOSIS — N419 Inflammatory disease of prostate, unspecified: Secondary | ICD-10-CM | POA: Diagnosis not present

## 2022-11-08 LAB — URINALYSIS, COMPLETE (UACMP) WITH MICROSCOPIC
Bilirubin Urine: NEGATIVE
Glucose, UA: NEGATIVE mg/dL
Ketones, ur: NEGATIVE mg/dL
Nitrite: NEGATIVE
Protein, ur: NEGATIVE mg/dL
Specific Gravity, Urine: 1.02 (ref 1.005–1.030)
Squamous Epithelial / HPF: NONE SEEN /HPF (ref 0–5)
WBC, UA: >50 WBC/hpf (ref 0–5)
pH: 5.5 (ref 5.0–8.0)

## 2022-11-08 LAB — BLADDER SCAN AMB NON-IMAGING: Scan Result: 147

## 2022-11-08 MED ORDER — TAMSULOSIN HCL 0.4 MG PO CAPS: 0.4000 mg | ORAL_CAPSULE | Freq: Every day | ORAL | 3 refills | Status: DC

## 2022-11-08 NOTE — Telephone Encounter (Signed)
-----   Message from Nori Riis, PA-C sent at 11/08/2022 10:22 AM EDT ----- Would you let Mr. Rewerts know that his urine was positive for white blood cells, red blood cells and many bacteria and we are sending it for culture?

## 2022-11-08 NOTE — Telephone Encounter (Signed)
Left message, informed patient we are sending urine for culture and will call with those results.

## 2022-11-10 ENCOUNTER — Other Ambulatory Visit: Payer: Self-pay | Admitting: Family Medicine

## 2022-11-10 ENCOUNTER — Telehealth: Payer: Self-pay | Admitting: Family Medicine

## 2022-11-10 DIAGNOSIS — R3989 Other symptoms and signs involving the genitourinary system: Secondary | ICD-10-CM

## 2022-11-10 LAB — URINE CULTURE: Culture: >=100000 — AB

## 2022-11-10 MED ORDER — NITROFURANTOIN MONOHYD MACRO 100 MG PO CAPS: 100.0000 mg | ORAL_CAPSULE | Freq: Two times a day (BID) | ORAL | 0 refills | Status: DC

## 2022-11-10 NOTE — Telephone Encounter (Signed)
-----   Message from Nori Riis, PA-C sent at 11/10/2022  9:53 AM EDT ----- Please let Mr. Derrick Sandoval know that there was microscopic hematuria on his UA, so I went ahead and sent it for culture.  It did grow out a bacteria, so I would like for him to start Macrobid 100 mg twice daily for 7 days.  I then would like to have his urine rechecked in 2 months.  I need this to be a voided specimen and not a cath specimen.

## 2022-11-10 NOTE — Telephone Encounter (Signed)
Patient notified and voiced understanding. Appointment made and ABX sent to pharmacy.

## 2022-11-17 ENCOUNTER — Encounter: Payer: Self-pay | Admitting: Family Medicine

## 2022-11-19 MED ORDER — BREZTRI AEROSPHERE 160-9-4.8 MCG/ACT IN AERO: 2.0000 | INHALATION_SPRAY | Freq: Two times a day (BID) | RESPIRATORY_TRACT | 11 refills | Status: DC

## 2022-12-02 ENCOUNTER — Ambulatory Visit (INDEPENDENT_AMBULATORY_CARE_PROVIDER_SITE_OTHER): Payer: PPO | Admitting: Family Medicine

## 2022-12-02 VITALS — BP 121/84 | HR 72 | Temp 97.9°F | Ht 67.52 in | Wt 273.1 lb

## 2022-12-02 DIAGNOSIS — R21 Rash and other nonspecific skin eruption: Secondary | ICD-10-CM

## 2022-12-02 DIAGNOSIS — J302 Other seasonal allergic rhinitis: Secondary | ICD-10-CM

## 2022-12-02 DIAGNOSIS — R0989 Other specified symptoms and signs involving the circulatory and respiratory systems: Secondary | ICD-10-CM

## 2022-12-02 MED ORDER — TRIAMCINOLONE ACETONIDE 0.5 % EX CREA: 1.0000 | TOPICAL_CREAM | Freq: Three times a day (TID) | CUTANEOUS | 0 refills | Status: DC

## 2022-12-02 MED ORDER — LEVOCETIRIZINE DIHYDROCHLORIDE 5 MG PO TABS: 5.0000 mg | ORAL_TABLET | Freq: Every evening | ORAL | 0 refills | Status: DC

## 2022-12-02 NOTE — Patient Instructions (Addendum)
Start taking Xyzal at night instead of Zyrtec Follow up with ENT Continue taking Mucinex daily Flonase + Astelin combined Albuterol inhaler for Shortness of breath as needed Use Triamcinolone cream daily after bathing

## 2022-12-02 NOTE — Progress Notes (Signed)
BP 121/84   Pulse 72   Temp 97.9 F (36.6 C) (Oral)   Ht 5' 7.52" (1.715 m)   Wt 273 lb 1.6 oz (123.9 kg)   SpO2 96%   BMI 42.12 kg/m    Subjective:    Patient ID: Derrick Sandoval., male    DOB: 19-Mar-1956, 67 y.o.   MRN: [id number]  HPI: Saw Mendenhall. is a 67 y.o. male  Chief Complaint  Patient presents with   Cough    Chest congestion, headache, will not go away    Rash    Patient states that he has a hair follicle infection on various parts of his body.     RASH Started 5 days ago, first appeared on stomach, and now on his bilateral lower legs and forearms.The rash is pink, maculopapular, and dispersed throughout. He denies itching, burning, tingling, and irritation. He denies scratching the areas.  CHEST CONGESTION Started 2 months ago, he has been dealing with this on and off.  Worst symptom: Chest congestion and tightness Fever: yes patient reported Cough: yes Shortness of breath: yes with activity, denies orthopnea Wheezing: no Chest pain: no Chest tightness: yes Chest congestion: yes Nasal congestion: no Runny nose: no Post nasal drip: no Sneezing: yes Sore throat: no Swollen glands: no Sinus pressure: no Headache: yes, taking ibuprofen and tylenol interchangeably Face pain: no Toothache: no Ear pain: no  Ear pressure: yes left Eyes red/itching:no Eye drainage/crusting: no  Vomiting: no Rash: no Fatigue: yes Sick contacts: no Strep contacts: no  Context: worse Recurrent sinusitis: no Relief with OTC cold/cough medications:  Astelin did not help   Treatments attempted: He has tried mucinex, singulair, astelin, zyrtec, flonase, and albuterol  Relevant past medical, surgical, family and social history reviewed and updated as indicated. Interim medical history since our last visit reviewed. Allergies and medications reviewed and updated.  Review of Systems  Constitutional:  Negative for chills, fatigue and fever.  HENT:  Positive for  congestion and sneezing. Negative for ear pain, postnasal drip, rhinorrhea, sinus pressure, sinus pain and sore throat.   Eyes:  Negative for discharge, redness and itching.  Respiratory:  Positive for cough and chest tightness. Negative for shortness of breath.   Cardiovascular:  Negative for chest pain.  Gastrointestinal:  Negative for vomiting.  Skin:  Positive for rash.  Allergic/Immunologic: Positive for environmental allergies.  Neurological:  Positive for headaches.    Per HPI unless specifically indicated above     Objective:    BP 121/84   Pulse 72   Temp 97.9 F (36.6 C) (Oral)   Ht 5' 7.52" (1.715 m)   Wt 273 lb 1.6 oz (123.9 kg)   SpO2 96%   BMI 42.12 kg/m   Wt Readings from Last 3 Encounters:  12/02/22 273 lb 1.6 oz (123.9 kg)  11/08/22 270 lb (122.5 kg)  10/14/22 278 lb 14.4 oz (126.5 kg)    Physical Exam Vitals and nursing note reviewed.  Constitutional:      General: He is not in acute distress.    Appearance: Normal appearance. He is not ill-appearing, toxic-appearing or diaphoretic.  HENT:     Head: Normocephalic and atraumatic.     Right Ear: Tympanic membrane, ear canal and external ear normal. There is no impacted cerumen.     Left Ear: Tympanic membrane, ear canal and external ear normal. There is no impacted cerumen.     Nose: Congestion present. No rhinorrhea.  Mouth/Throat:     Mouth: Mucous membranes are moist.     Pharynx: Oropharynx is clear. No oropharyngeal exudate or posterior oropharyngeal erythema.  Eyes:     General: No scleral icterus.       Right eye: No discharge.        Left eye: No discharge.     Extraocular Movements: Extraocular movements intact.     Conjunctiva/sclera: Conjunctivae normal.     Pupils: Pupils are equal, round, and reactive to light.  Neck:     Vascular: No carotid bruit.  Cardiovascular:     Rate and Rhythm: Normal rate and regular rhythm.     Pulses: Normal pulses.     Heart sounds: Normal heart  sounds. No murmur heard.    No friction rub. No gallop.  Pulmonary:     Effort: Pulmonary effort is normal. No respiratory distress.     Breath sounds: Normal breath sounds. No stridor. No wheezing, rhonchi or rales.  Chest:     Chest wall: No tenderness.  Musculoskeletal:        General: Normal range of motion.     Cervical back: Normal range of motion and neck supple. No rigidity. No muscular tenderness.  Lymphadenopathy:     Cervical: No cervical adenopathy.  Skin:    General: Skin is warm and dry.     Capillary Refill: Capillary refill takes less than 2 seconds.     Coloration: Skin is not jaundiced or pale.     Findings: No bruising, erythema, lesion or rash.  Neurological:     General: No focal deficit present.     Mental Status: He is alert and oriented to person, place, and time. Mental status is at baseline.     Cranial Nerves: No cranial nerve deficit.     Sensory: No sensory deficit.     Motor: No weakness.     Coordination: Coordination normal.     Gait: Gait normal.     Deep Tendon Reflexes: Reflexes normal.  Psychiatric:        Mood and Affect: Mood normal.        Behavior: Behavior normal.        Thought Content: Thought content normal.        Judgment: Judgment normal.     Results for orders placed or performed during the hospital encounter of 11/08/22  Urine Culture   Specimen: Urine, Random  Result Value Ref Range   Specimen Description      URINE, RANDOM Performed at [facility], [address], [place], [place] [address]    Special Requests      NONE Performed at [facility], [address], [place], [place] [address]    Culture >=100,000 COLONIES/mL ESCHERICHIA COLI (A)    Report Status 11/10/2022 FINAL    Organism ID, Bacteria ESCHERICHIA COLI (A)       Susceptibility   Escherichia coli - MIC*    AMPICILLIN >=32 RESISTANT Resistant     CEFAZOLIN <=4 SENSITIVE Sensitive     CEFEPIME 0.25 SENSITIVE Sensitive      CEFTRIAXONE <=0.25 SENSITIVE Sensitive     CIPROFLOXACIN >=4 RESISTANT Resistant     GENTAMICIN <=1 SENSITIVE Sensitive     IMIPENEM <=0.25 SENSITIVE Sensitive     NITROFURANTOIN <=16 SENSITIVE Sensitive     TRIMETH/SULFA >=320 RESISTANT Resistant     AMPICILLIN/SULBACTAM 16 INTERMEDIATE Intermediate     PIP/TAZO <=4 SENSITIVE Sensitive     * >=100,000  COLONIES/mL ESCHERICHIA COLI  Urinalysis, Complete w Microscopic -  Result Value Ref Range   Color, Urine YELLOW YELLOW   APPearance HAZY (A) CLEAR   Specific Gravity, Urine 1.020 1.005 - 1.030   pH 5.5 5.0 - 8.0   Glucose, UA NEGATIVE NEGATIVE mg/dL   Hgb urine dipstick MODERATE (A) NEGATIVE   Bilirubin Urine NEGATIVE NEGATIVE   Ketones, ur NEGATIVE NEGATIVE mg/dL   Protein, ur NEGATIVE NEGATIVE mg/dL   Nitrite NEGATIVE NEGATIVE   Leukocytes,Ua LARGE (A) NEGATIVE   Squamous Epithelial / HPF NONE SEEN 0 - 5 /HPF   WBC, UA >50 0 - 5 WBC/hpf   RBC / HPF 11-20 0 - 5 RBC/hpf   Bacteria, UA MANY (A) NONE SEEN      Assessment & Plan:   Problem List Items Addressed This Visit       Respiratory   Rhinitis, allergic - Primary    Acute, ongoing. Recommend switching Zyrtec to Xyzal nightly, continue use of flonase, asteline, singulair, and albuterol PRN. Recommend continue use of alternating tylenol and ibuprofen for associated headache.  Recommend f/u with ENT and f/u if symptoms worsen.      Other Visit Diagnoses     Rash and nonspecific skin eruption       Acute, ongoing. Prescribed Trimacinolone 0.5% to apply to areas after bath while rash persist.   Chest congestion       Acute, ongoing. Chest xray ordered to rule out CAP. Will treat based on results. Continue use of mucinex PRN.   Relevant Orders   DG Chest 2 View        Follow up plan: Return in about 19 weeks (around 04/14/2023), or if symptoms worsen or fail to improve, for physical with Dr. Laural Benes.

## 2022-12-02 NOTE — Assessment & Plan Note (Addendum)
Acute, ongoing. Recommend switching Zyrtec to Xyzal nightly, continue use of flonase, asteline, singulair, and albuterol PRN. Recommend continue use of alternating tylenol and ibuprofen for associated headache.  Recommend f/u with ENT and f/u if symptoms worsen.

## 2022-12-16 ENCOUNTER — Encounter: Payer: Self-pay | Admitting: Family Medicine

## 2022-12-20 ENCOUNTER — Ambulatory Visit
Admission: RE | Admit: 2022-12-20 | Discharge: 2022-12-20 | Disposition: A | Discharge status: Home / Self Care | Payer: PPO | Attending: Family Medicine | Admitting: Family Medicine

## 2022-12-20 ENCOUNTER — Ambulatory Visit
Admission: RE | Admit: 2022-12-20 | Discharge: 2022-12-20 | Disposition: A | Discharge status: Home / Self Care | Payer: PPO | Source: Ambulatory Visit | Attending: Family Medicine | Admitting: Family Medicine

## 2022-12-20 DIAGNOSIS — R0989 Other specified symptoms and signs involving the circulatory and respiratory systems: Secondary | ICD-10-CM | POA: Diagnosis not present

## 2022-12-23 ENCOUNTER — Encounter: Payer: Self-pay | Admitting: Family Medicine

## 2022-12-29 NOTE — Progress Notes (Signed)
Called patient and he stated that he would call back to schedule his appointment for his rash, he was waiting on people right now.

## 2023-01-01 NOTE — Progress Notes (Addendum)
01/03/23 10:17 AM   [name]. 1955/12/20 [id number]  Referring provider:  Dorcas Carrow, DO [address] [place],  [place] [address]  Urological history: 1. Incomplete bladder emptying -He has a personal history of a TURP and urethral stricture.  He was being followed by [facility] urology where he is undergone urodynamics. He was scheduled to have an InterStim device placed by Dr. Daine Gip on 12/23/2020 and 01/06/2021 - deferred -currently cathing three times daily   2. BPH with LU TS -PSA (09/2022) 0.22 -tadalafil 5 mg daily and CIC  3. Orchitis and epididymitis -had episode in 2021   4.  Asymptomatic bacteriuria - Likely colonized due to self cathing   Follow-up    HPI: Derrick Sandoval. is a 67 y.o.male who presents today for a a recheck on his UA to ensure his micro heme has resolved.    At his visit on 10/11/2022, I PSS 17/3.  PVR 31 mL.  He has been experiencing dysuria, nausea and feeling hot and cold for the last 3 days.  He has some back pain which he contributes to constipation.  He thinks he may have a prostate infection.  He is still self cathing 2-3 times daily.  He will sometimes postpone self-catheterization in order to have an urge to urinate.  He was taking tadalafil 5 mg daily, but he discontinued the medication and his symptoms got worse.  Since he is no longer sexually active, he is wondering if he can go back on the tamsulosin to see if that was more effective.  He does continue to have issues with postvoid dribbling.  Patient denies any modifying or aggravating factors.  Patient denies any gross hematuria or suprapubic/flank pain.  Patient denies any fevers, chills, nausea or vomiting.  His prostate was indurated and tender on exam.  He was started on Septra DS twice daily x 30 days.    At his visit on 11/08/2022, I PPS 9/4.  PVR 147 mL  CATH UA yellow hazy, specific gravity 1.020, pH 5.5, moderate hemoglobin, large leukocytes, greater than 50 WBCs, 11-20 RBCs  and many bacteria.  He feels much better today.  With the tamsulosin he has noticed that the pulse alendronate has dramatically decreased.  The dysuria, nausea and feeling hot and cold has dissipated.  He still has some back pain which he feels is secondary to his constipation.  Patient denies any modifying or aggravating factors.  Patient denies any gross hematuria, dysuria or suprapubic/flank pain.  Patient denies any fevers, chills, nausea or vomiting.   He continues to self cath twice daily for the exception of every 3 days he will cath 3 times. His urine culture was positive for E.coli.   He is at baseline.  He is cathing without issue.  He has not needed any catheters at this time.  Patient denies any modifying or aggravating factors.  Patient denies any gross hematuria, dysuria or suprapubic/flank pain.  Patient denies any fevers, chills, nausea or vomiting.    UA yellow clear, specific gravity 1.020, pH 6.5, 0-5 WBCs, 0-5 RBCs and rare bacteria.  PMH: Past Medical History:  Diagnosis Date   Allergy    Arthritis    Cervical radiculopathy 01/16/2021   Cough    nonproductive no fever saw at dr 05-26-18   GERD (gastroesophageal reflux disease)    Hyperlipidemia    Hypertension    Meningitis due to viruses 1968    Surgical History: Past Surgical History:  Procedure Laterality Date   COLONOSCOPY WITH PROPOFOL N/A 05/12/2018   Procedure: COLONOSCOPY WITH PROPOFOL;  Surgeon: Wyline Mood, MD;  Location: [facility] ENDOSCOPY;  Service: Gastroenterology;  Laterality: N/A;   ESOPHAGOGASTRODUODENOSCOPY (EGD) WITH PROPOFOL N/A 05/12/2018   Procedure: ESOPHAGOGASTRODUODENOSCOPY (EGD) WITH PROPOFOL;  Surgeon: Wyline Mood, MD;  Location: [facility] ENDOSCOPY;  Service: Gastroenterology;  Laterality: N/A;   ESOPHAGOGASTRODUODENOSCOPY (EGD) WITH PROPOFOL N/A 06/01/2018   Procedure: ESOPHAGOGASTRODUODENOSCOPY (EGD) WITH PROPOFOL;  Surgeon: Rachael Fee, MD;  Location: [facility] ENDOSCOPY;  Service: Endoscopy;   Laterality: N/A;   EUS N/A 06/01/2018   Procedure: UPPER ENDOSCOPIC ULTRASOUND (EUS) RADIAL;  Surgeon: Rachael Fee, MD;  Location: [facility] ENDOSCOPY;  Service: Endoscopy;  Laterality: N/A;   JOINT REPLACEMENT Bilateral    NASAL SINUS SURGERY     TOTAL KNEE ARTHROPLASTY      Home Medications:  Allergies as of 01/03/2023   No Known Allergies      Medication List        Accurate as of Jan 03, 2023 10:17 AM. If you have any questions, ask your nurse or doctor.          STOP taking these medications    nitrofurantoin (macrocrystal-monohydrate) 100 MG capsule Commonly known as: MACROBID   sulfamethoxazole-trimethoprim 800-160 MG tablet Commonly known as: BACTRIM DS       TAKE these medications    acyclovir 400 MG tablet Commonly known as: ZOVIRAX Take 1 tablet (400 mg total) by mouth 2 (two) times daily.   albuterol 108 (90 Base) MCG/ACT inhaler Commonly known as: VENTOLIN HFA Inhale 1-2 puffs into the lungs every 6 (six) hours as needed for wheezing or shortness of breath.   amoxicillin 500 MG capsule Commonly known as: AMOXIL Take 1,000 mg by mouth 2 (two) times daily.   aspirin EC 81 MG tablet Take 81 mg by mouth daily.   azelastine 0.1 % nasal spray Commonly known as: ASTELIN SMARTSIG:1-2 Spray(s) Both Nares Twice Daily   Breztri Aerosphere 160-9-4.8 MCG/ACT Aero Generic drug: Budeson-Glycopyrrol-Formoterol Inhale 2 puffs into the lungs 2 (two) times daily.   cetirizine 10 MG tablet Commonly known as: ZYRTEC Take 1 tablet (10 mg total) by mouth daily.   cholecalciferol 1000 units tablet Commonly known as: VITAMIN D Take 1,000 Units by mouth daily.   cyclobenzaprine 10 MG tablet Commonly known as: FLEXERIL Take 1 tablet (10 mg total) by mouth 3 (three) times daily as needed. for muscle spams   diclofenac Sodium 1 % Gel Commonly known as: Voltaren Apply 4 g topically 4 (four) times daily.   famotidine 20 MG tablet Commonly known as: PEPCID Take  1 tablet (20 mg total) by mouth 2 (two) times daily.   fluticasone 50 MCG/ACT nasal spray Commonly known as: FLONASE Place 2 sprays into both nostrils daily.   guaiFENesin 600 MG 12 hr tablet Commonly known as: Mucinex Take 1 tablet (600 mg total) by mouth 2 (two) times daily.   levocetirizine 5 MG tablet Commonly known as: XYZAL Take 1 tablet (5 mg total) by mouth every evening.   lisinopril 20 MG tablet Commonly known as: ZESTRIL Take 1 tablet (20 mg total) by mouth daily.   lubiprostone 8 MCG capsule Commonly known as: Amitiza Take 1 capsule (8 mcg total) by mouth 2 (two) times daily with a meal.   methocarbamol 500 MG tablet Commonly known as: ROBAXIN Take 500 mg by mouth daily as needed.   montelukast 10 MG tablet Commonly known as: SINGULAIR Take 1 tablet (10  mg total) by mouth at bedtime.   multivitamin with minerals tablet Take 1 tablet by mouth daily.   naproxen sodium 220 MG tablet Commonly known as: ALEVE Take 220 mg by mouth daily as needed.   nystatin cream Commonly known as: MYCOSTATIN Apply 1 Application topically 2 (two) times daily.   nystatin powder Commonly known as: MYCOSTATIN/NYSTOP Apply 1 Application topically 3 (three) times daily.   omeprazole 40 MG capsule Commonly known as: PRILOSEC Take 1 capsule (40 mg total) by mouth daily.   sertraline 100 MG tablet Commonly known as: ZOLOFT Take 1 tablet (100 mg total) by mouth daily.   tadalafil 5 MG tablet Commonly known as: CIALIS TAKE 1 TABLET BY MOUTH ONCE DAILY AS NEEDED FOR BPH   tamsulosin 0.4 MG Caps capsule Commonly known as: FLOMAX Take 1 capsule (0.4 mg total) by mouth daily.   triamcinolone ointment 0.1 % Commonly known as: KENALOG Apply 1 application topically 2 (two) times daily.   triamcinolone cream 0.5 % Commonly known as: KENALOG Apply 1 Application topically 3 (three) times daily.   vitamin C 1000 MG tablet Take 1,000 mg by mouth at bedtime.         Allergies:  No Known Allergies  Family History: Family History  Problem Relation Age of Onset   Breast cancer Mother    Cancer Father        unsure, passed away before it was confirmed   Pancreatic cancer Daughter    Diabetes Neg Hx    Heart disease Neg Hx    Hypertension Neg Hx    Stroke Neg Hx    COPD Neg Hx    Colon cancer Neg Hx     Social History:  reports that he has never smoked. He has never been exposed to tobacco smoke. He has never used smokeless tobacco. He reports that he does not currently use alcohol. He reports that he does not use drugs.   Physical Exam: BP (!) 173/96 (BP Location: Left Arm, Patient Position: Sitting, Cuff Size: Large)   Pulse 65   Ht 5' 7.5" (1.715 m)   Wt 270 lb (122.5 kg)   BMI 41.66 kg/m   Constitutional:  Well nourished. Alert and oriented, No acute distress. HEENT: [place] AT, moist mucus membranes.  Trachea midline Cardiovascular: No clubbing, cyanosis, or edema. Respiratory: Normal respiratory effort, no increased work of breathing. Neurologic: Grossly intact, no focal deficits, moving all 4 extremities. Psychiatric: Normal mood and affect.   Laboratory Data: Component     Latest Ref Rng 01/03/2023  Glucose, UA     NEGATIVE mg/dL NEGATIVE   WBC, UA     0 - 5 WBC/hpf 0-5   Bacteria, UA     NONE SEEN  RARE !   Color, Urine     YELLOW  YELLOW   Appearance     CLEAR  CLEAR   Specific Gravity, Urine     1.005 - 1.030  1.020   pH     5.0 - 8.0  6.5   Hgb urine dipstick     NEGATIVE  NEGATIVE   Bilirubin Urine     NEGATIVE  NEGATIVE   Ketones, ur     NEGATIVE mg/dL NEGATIVE   Protein     NEGATIVE mg/dL NEGATIVE   Nitrite     NEGATIVE  NEGATIVE   Leukocytes,Ua     NEGATIVE  NEGATIVE   Squamous Epithelial / HPF     0 - 5 /HPF NONE SEEN  RBC / HPF     0 - 5 RBC/hpf 0-5     Legend: ! Abnormal I have reviewed the labs.   Pertinent imaging. N/A   Assessment & Plan:    1. Incomplete bladder emptying -Patient  caths 4 times daily, indefinitely due to stricture disease and BPH.  Patient requires coude caths because he has prostate enlargement and cannot advance straight catheter.     2. BPH with LUTS -PSA stable -continue conservative management, avoiding bladder irritants and timed voiding's -tamsulosin 0.4 mg daily  3. Prostatitis -Resolved  4. Microhematuria -repeat UA negative for micro heme -he denies gross heme   Return for as scheduled .  Michiel Cowboy, PA-C   [facility]  [facility]) [address], [address] [place], [place] [address] [phone]

## 2023-01-03 ENCOUNTER — Encounter: Payer: Self-pay | Admitting: Urology

## 2023-01-03 ENCOUNTER — Ambulatory Visit (INDEPENDENT_AMBULATORY_CARE_PROVIDER_SITE_OTHER): Payer: PPO | Admitting: Urology

## 2023-01-03 ENCOUNTER — Other Ambulatory Visit
Admission: RE | Admit: 2023-01-03 | Discharge: 2023-01-03 | Disposition: A | Discharge status: Home / Self Care | Payer: PPO | Attending: Urology | Admitting: Urology

## 2023-01-03 VITALS — BP 173/96 | HR 65 | Ht 67.5 in | Wt 270.0 lb

## 2023-01-03 DIAGNOSIS — N419 Inflammatory disease of prostate, unspecified: Secondary | ICD-10-CM

## 2023-01-03 DIAGNOSIS — N401 Enlarged prostate with lower urinary tract symptoms: Secondary | ICD-10-CM

## 2023-01-03 DIAGNOSIS — R3129 Other microscopic hematuria: Secondary | ICD-10-CM

## 2023-01-03 DIAGNOSIS — R3989 Other symptoms and signs involving the genitourinary system: Secondary | ICD-10-CM | POA: Insufficient documentation

## 2023-01-03 DIAGNOSIS — R339 Retention of urine, unspecified: Secondary | ICD-10-CM

## 2023-01-03 DIAGNOSIS — N138 Other obstructive and reflux uropathy: Secondary | ICD-10-CM

## 2023-01-03 LAB — URINALYSIS, COMPLETE (UACMP) WITH MICROSCOPIC
Bilirubin Urine: NEGATIVE
Glucose, UA: NEGATIVE mg/dL
Hgb urine dipstick: NEGATIVE
Ketones, ur: NEGATIVE mg/dL
Leukocytes,Ua: NEGATIVE
Nitrite: NEGATIVE
Protein, ur: NEGATIVE mg/dL
Specific Gravity, Urine: 1.02 (ref 1.005–1.030)
Squamous Epithelial / HPF: NONE SEEN /HPF (ref 0–5)
pH: 6.5 (ref 5.0–8.0)

## 2023-01-04 ENCOUNTER — Encounter: Payer: Self-pay | Admitting: Family Medicine

## 2023-01-04 ENCOUNTER — Ambulatory Visit (INDEPENDENT_AMBULATORY_CARE_PROVIDER_SITE_OTHER): Payer: PPO | Admitting: Family Medicine

## 2023-01-04 VITALS — BP 150/80 | HR 66 | Temp 98.0°F | Ht 67.5 in | Wt 277.8 lb

## 2023-01-04 DIAGNOSIS — I10 Essential (primary) hypertension: Secondary | ICD-10-CM

## 2023-01-04 DIAGNOSIS — R21 Rash and other nonspecific skin eruption: Secondary | ICD-10-CM | POA: Diagnosis not present

## 2023-01-04 MED ORDER — PREDNISONE 50 MG PO TABS: 50.0000 mg | ORAL_TABLET | Freq: Every day | ORAL | 0 refills | Status: DC

## 2023-01-04 MED ORDER — TRIAMCINOLONE ACETONIDE 40 MG/ML IJ SUSP
40.0000 mg | Freq: Once | INTRAMUSCULAR | Status: AC
  Administered 2023-01-04: 40 mg via INTRAMUSCULAR

## 2023-01-04 MED ORDER — MUPIROCIN 2 % EX OINT: 1.0000 | TOPICAL_OINTMENT | Freq: Two times a day (BID) | CUTANEOUS | 2 refills | Status: DC

## 2023-01-04 NOTE — Assessment & Plan Note (Signed)
Running high today, but has been really good recently- will work on [employer] and recheck in about 3 weeks.

## 2023-01-04 NOTE — Progress Notes (Signed)
BP (!) 150/80   Pulse 66   Temp 98 F (36.7 C) (Oral)   Ht 5' 7.5" (1.715 m)   Wt 277 lb 12.8 oz (126 kg)   SpO2 95%   BMI 42.87 kg/m    Subjective:    Patient ID: Derrick Sandoval., male    DOB: May 08, 1956, 67 y.o.   MRN: [id number]  HPI: Ha Krengel. is a 67 y.o. male  Chief Complaint  Patient presents with   Rash    Patient says he still has the rash from his previous visit and says it is not getting any better. Patient says he noticed the rash was very bad when his blood pressure reading was elevated, and says today it is not as bad.    RASH Duration:  weeks  Location: generalized  Itching: yes Burning: no Redness: no Oozing: no Scaling: yes Blisters: no Painful: no Fevers: no Change in detergents/soaps/personal care products: no Recent illness: no Recent travel:no History of same: no Context: stable Alleviating factors: nothing Treatments attempted:hydrocortisone cream Shortness of breath: no  Throat/tongue swelling: no Myalgias/arthralgias: no   Relevant past medical, surgical, family and social history reviewed and updated as indicated. Interim medical history since our last visit reviewed. Allergies and medications reviewed and updated.  Review of Systems  Constitutional: Negative.   Respiratory: Negative.    Cardiovascular: Negative.   Gastrointestinal: Negative.   Musculoskeletal: Negative.   Skin:  Positive for rash. Negative for color change, pallor and wound.  Psychiatric/Behavioral: Negative.      Per HPI unless specifically indicated above     Objective:    BP (!) 150/80   Pulse 66   Temp 98 F (36.7 C) (Oral)   Ht 5' 7.5" (1.715 m)   Wt 277 lb 12.8 oz (126 kg)   SpO2 95%   BMI 42.87 kg/m   Wt Readings from Last 3 Encounters:  01/04/23 277 lb 12.8 oz (126 kg)  01/03/23 270 lb (122.5 kg)  12/02/22 273 lb 1.6 oz (123.9 kg)    Physical Exam Vitals and nursing note reviewed.  Constitutional:      General: He is not  in acute distress.    Appearance: Normal appearance. He is not ill-appearing, toxic-appearing or diaphoretic.  HENT:     Head: Normocephalic and atraumatic.     Right Ear: External ear normal.     Left Ear: External ear normal.     Nose: Nose normal.     Mouth/Throat:     Mouth: Mucous membranes are moist.     Pharynx: Oropharynx is clear.  Eyes:     General: No scleral icterus.       Right eye: No discharge.        Left eye: No discharge.     Extraocular Movements: Extraocular movements intact.     Conjunctiva/sclera: Conjunctivae normal.     Pupils: Pupils are equal, round, and reactive to light.  Cardiovascular:     Rate and Rhythm: Normal rate and regular rhythm.     Pulses: Normal pulses.     Heart sounds: Normal heart sounds. No murmur heard.    No friction rub. No gallop.  Pulmonary:     Effort: Pulmonary effort is normal. No respiratory distress.     Breath sounds: Normal breath sounds. No stridor. No wheezing, rhonchi or rales.  Chest:     Chest wall: No tenderness.  Musculoskeletal:        General: Normal range  of motion.     Cervical back: Normal range of motion and neck supple.  Skin:    General: Skin is warm and dry.     Capillary Refill: Capillary refill takes less than 2 seconds.     Coloration: Skin is not jaundiced or pale.     Findings: No bruising, erythema, lesion or rash.     Comments: Excoriated lesions on legs and abdomen  Neurological:     General: No focal deficit present.     Mental Status: He is alert and oriented to person, place, and time. Mental status is at baseline.  Psychiatric:        Mood and Affect: Mood normal.        Behavior: Behavior normal.        Thought Content: Thought content normal.        Judgment: Judgment normal.     Results for orders placed or performed during the hospital encounter of 01/03/23  Urinalysis, Complete w Microscopic -  Result Value Ref Range   Color, Urine YELLOW YELLOW   APPearance CLEAR CLEAR    Specific Gravity, Urine 1.020 1.005 - 1.030   pH 6.5 5.0 - 8.0   Glucose, UA NEGATIVE NEGATIVE mg/dL   Hgb urine dipstick NEGATIVE NEGATIVE   Bilirubin Urine NEGATIVE NEGATIVE   Ketones, ur NEGATIVE NEGATIVE mg/dL   Protein, ur NEGATIVE NEGATIVE mg/dL   Nitrite NEGATIVE NEGATIVE   Leukocytes,Ua NEGATIVE NEGATIVE   Squamous Epithelial / HPF NONE SEEN 0 - 5 /HPF   WBC, UA 0-5 0 - 5 WBC/hpf   RBC / HPF 0-5 0 - 5 RBC/hpf   Bacteria, UA RARE (A) NONE SEEN      Assessment & Plan:   Problem List Items Addressed This Visit       Cardiovascular and Mediastinum   Benign hypertension    Running high today, but has been really good recently- will work on [employer] and recheck in about 3 weeks.       Other Visit Diagnoses     Rash and nonspecific skin eruption    -  Primary   Will treat with steroids and bactroban ointment. Call with any concerns. Continue to monitor.   Relevant Medications   triamcinolone acetonide (KENALOG-40) injection 40 mg (Completed)        Follow up plan: Return in about 3 weeks (around 01/25/2023).

## 2023-01-13 ENCOUNTER — Encounter: Payer: Self-pay | Admitting: Family Medicine

## 2023-01-21 NOTE — Telephone Encounter (Signed)
[facility] requesting patient return my call directly at [phone].  Will inform patient that I will contact [employer] for further information regarding the specialist copay charge.

## 2023-01-26 ENCOUNTER — Ambulatory Visit: Payer: PPO | Admitting: Family Medicine

## 2023-03-15 ENCOUNTER — Encounter: Payer: Self-pay | Admitting: Family Medicine

## 2023-04-07 ENCOUNTER — Encounter: Payer: Self-pay | Admitting: Family Medicine

## 2023-04-14 ENCOUNTER — Ambulatory Visit: Payer: PPO | Admitting: Family Medicine

## 2023-04-25 ENCOUNTER — Ambulatory Visit (INDEPENDENT_AMBULATORY_CARE_PROVIDER_SITE_OTHER): Payer: PPO | Admitting: Family Medicine

## 2023-04-25 ENCOUNTER — Encounter: Payer: Self-pay | Admitting: Family Medicine

## 2023-04-25 VITALS — BP 136/80 | HR 68 | Temp 97.7°F | Wt 278.0 lb

## 2023-04-25 DIAGNOSIS — R454 Irritability and anger: Secondary | ICD-10-CM | POA: Diagnosis not present

## 2023-04-25 DIAGNOSIS — I10 Essential (primary) hypertension: Secondary | ICD-10-CM

## 2023-04-25 DIAGNOSIS — K5900 Constipation, unspecified: Secondary | ICD-10-CM | POA: Diagnosis not present

## 2023-04-25 DIAGNOSIS — R338 Other retention of urine: Secondary | ICD-10-CM

## 2023-04-25 DIAGNOSIS — E559 Vitamin D deficiency, unspecified: Secondary | ICD-10-CM

## 2023-04-25 DIAGNOSIS — N401 Enlarged prostate with lower urinary tract symptoms: Secondary | ICD-10-CM

## 2023-04-25 DIAGNOSIS — E782 Mixed hyperlipidemia: Secondary | ICD-10-CM

## 2023-04-25 MED ORDER — LISINOPRIL 20 MG PO TABS: 20.0000 mg | ORAL_TABLET | Freq: Every day | ORAL | 1 refills | Status: DC

## 2023-04-25 MED ORDER — MUPIROCIN 2 % EX OINT: 1.0000 | TOPICAL_OINTMENT | Freq: Two times a day (BID) | CUTANEOUS | 2 refills | Status: DC

## 2023-04-25 MED ORDER — MONTELUKAST SODIUM 10 MG PO TABS: 10.0000 mg | ORAL_TABLET | Freq: Every day | ORAL | 1 refills | Status: DC

## 2023-04-25 MED ORDER — FAMOTIDINE 20 MG PO TABS: 20.0000 mg | ORAL_TABLET | Freq: Two times a day (BID) | ORAL | 1 refills | Status: DC

## 2023-04-25 MED ORDER — ACYCLOVIR 400 MG PO TABS: 400.0000 mg | ORAL_TABLET | Freq: Two times a day (BID) | ORAL | 1 refills | Status: DC

## 2023-04-25 MED ORDER — OMEPRAZOLE 40 MG PO CPDR: 40.0000 mg | DELAYED_RELEASE_CAPSULE | Freq: Every day | ORAL | 1 refills | Status: DC

## 2023-04-25 MED ORDER — SERTRALINE HCL 100 MG PO TABS: 100.0000 mg | ORAL_TABLET | Freq: Every day | ORAL | 1 refills | Status: DC

## 2023-04-25 MED ORDER — ALBUTEROL SULFATE HFA 108 (90 BASE) MCG/ACT IN AERS: 1.0000 | INHALATION_SPRAY | Freq: Four times a day (QID) | RESPIRATORY_TRACT | 3 refills | Status: AC | PRN

## 2023-04-25 NOTE — Assessment & Plan Note (Signed)
Rechecking labs today. Await results. Treat as needed.  °

## 2023-04-25 NOTE — Assessment & Plan Note (Signed)
Currently well controlled on amitiza. Has been getting this through [facility]. Will refer to pharmacy for help completing the paperwork. Call with any concerns.

## 2023-04-25 NOTE — Progress Notes (Signed)
BP 136/80   Pulse 68   Temp 97.7 F (36.5 C) (Oral)   Wt 278 lb (126.1 kg)   SpO2 96%   BMI 42.90 kg/m    Subjective:    Patient ID: Derrick Sandoval., male    DOB: 17-Nov-1955, 67 y.o.   MRN: [id number]  HPI: Derrick Sandoval. is a 67 y.o. male  Chief Complaint  Patient presents with   Hypertension   Hyperlipidemia   HYPERTENSION / HYPERLIPIDEMIA Satisfied with current treatment? yes Duration of hypertension: chronic BP medication side effects: no Past BP meds: lisinopril Duration of hyperlipidemia: chronic Cholesterol medication side effects: no Cholesterol supplements: none Past cholesterol medications: none Medication compliance: excellent compliance Aspirin: yes Recent stressors: no Recurrent headaches: no Visual changes: no Palpitations: no Dyspnea: no Chest pain: no Lower extremity edema: no Dizzy/lightheaded: no  ANXIETY/STRESS Duration:chronic Status: controlled Anxious mood: yes  Excessive worrying: no Irritability: yes  Sweating: no Nausea: no Palpitations:no Hyperventilation: no Panic attacks: no Agoraphobia: no  Obscessions/compulsions: no Depressed mood: no    04/25/2023   10:49 AM 01/04/2023   10:53 AM 12/02/2022    2:45 PM 10/14/2022   10:47 AM 08/24/2022    9:13 AM  Depression screen PHQ 2/9  Decreased Interest 0 0 3 1 3   Down, Depressed, Hopeless 0 0 0 0 1  PHQ - 2 Score 0 0 3 1 4   Altered sleeping 3 0 2 0 3  Tired, decreased energy 0 0 3 1 2   Change in appetite 0 2 2 0 2  Feeling bad or failure about yourself  0 0 0 0 0  Trouble concentrating 3 0 0 0 1  Moving slowly or fidgety/restless 0 0 0 0 0  Suicidal thoughts 0 0 0 0 0  PHQ-9 Score 6 2 10 2 12   Difficult doing work/chores Not difficult at all Not difficult at all Somewhat difficult Somewhat difficult Somewhat difficult   Anhedonia: no Weight changes: no Insomnia: no  Hypersomnia: no Fatigue/loss of energy: no Feelings of worthlessness: no Feelings of guilt:  no Impaired concentration/indecisiveness: no Suicidal ideations: no  Crying spells: no Recent Stressors/Life Changes: no   Relationship problems: no   Family stress: no     Financial stress: no    Job stress: no    Recent death/loss: no  BPH BPH status: controlled Satisfied with current treatment?: yes Medication side effects: no Medication compliance: excellent compliance Duration: chronic Nocturia: 1/night Urinary frequency:no Incomplete voiding: no Urgency: no Weak urinary stream: no Straining to start stream: no Dysuria: no Onset: gradual Severity: severe  Relevant past medical, surgical, family and social history reviewed and updated as indicated. Interim medical history since our last visit reviewed. Allergies and medications reviewed and updated.  Review of Systems  Constitutional: Negative.   Respiratory: Negative.    Cardiovascular: Negative.   Gastrointestinal: Negative.   Musculoskeletal: Negative.   Skin:  Positive for rash. Negative for color change, pallor and wound.  Neurological: Negative.   Psychiatric/Behavioral: Negative.      Per HPI unless specifically indicated above     Objective:    BP 136/80   Pulse 68   Temp 97.7 F (36.5 C) (Oral)   Wt 278 lb (126.1 kg)   SpO2 96%   BMI 42.90 kg/m   Wt Readings from Last 3 Encounters:  04/25/23 278 lb (126.1 kg)  01/04/23 277 lb 12.8 oz (126 kg)  01/03/23 270 lb (122.5 kg)    Physical Exam  Vitals and nursing note reviewed.  Constitutional:      General: He is not in acute distress.    Appearance: Normal appearance. He is obese. He is not ill-appearing, toxic-appearing or diaphoretic.  HENT:     Head: Normocephalic and atraumatic.     Right Ear: External ear normal.     Left Ear: External ear normal.     Nose: Nose normal.     Mouth/Throat:     Mouth: Mucous membranes are moist.     Pharynx: Oropharynx is clear.  Eyes:     General: No scleral icterus.       Right eye: No discharge.         Left eye: No discharge.     Extraocular Movements: Extraocular movements intact.     Conjunctiva/sclera: Conjunctivae normal.     Pupils: Pupils are equal, round, and reactive to light.  Cardiovascular:     Rate and Rhythm: Normal rate and regular rhythm.     Pulses: Normal pulses.     Heart sounds: Normal heart sounds. No murmur heard.    No friction rub. No gallop.  Pulmonary:     Effort: Pulmonary effort is normal. No respiratory distress.     Breath sounds: Normal breath sounds. No stridor. No wheezing, rhonchi or rales.  Chest:     Chest wall: No tenderness.  Musculoskeletal:        General: Normal range of motion.     Cervical back: Normal range of motion and neck supple.  Skin:    General: Skin is warm and dry.     Capillary Refill: Capillary refill takes less than 2 seconds.     Coloration: Skin is not jaundiced or pale.     Findings: No bruising, erythema, lesion or rash.  Neurological:     General: No focal deficit present.     Mental Status: He is alert and oriented to person, place, and time. Mental status is at baseline.  Psychiatric:        Mood and Affect: Mood normal.        Behavior: Behavior normal.        Thought Content: Thought content normal.        Judgment: Judgment normal.     Results for orders placed or performed during the hospital encounter of 01/03/23  Urinalysis, Complete w Microscopic -  Result Value Ref Range   Color, Urine YELLOW YELLOW   APPearance CLEAR CLEAR   Specific Gravity, Urine 1.020 1.005 - 1.030   pH 6.5 5.0 - 8.0   Glucose, UA NEGATIVE NEGATIVE mg/dL   Hgb urine dipstick NEGATIVE NEGATIVE   Bilirubin Urine NEGATIVE NEGATIVE   Ketones, ur NEGATIVE NEGATIVE mg/dL   Protein, ur NEGATIVE NEGATIVE mg/dL   Nitrite NEGATIVE NEGATIVE   Leukocytes,Ua NEGATIVE NEGATIVE   Squamous Epithelial / HPF NONE SEEN 0 - 5 /HPF   WBC, UA 0-5 0 - 5 WBC/hpf   RBC / HPF 0-5 0 - 5 RBC/hpf   Bacteria, UA RARE (A) NONE SEEN      Assessment  & Plan:   Problem List Items Addressed This Visit       Cardiovascular and Mediastinum   Benign hypertension - Primary    Under good control on current regimen. Continue current regimen. Continue to monitor. Call with any concerns. Refills given. Labs drawn today.        Relevant Medications   lisinopril (ZESTRIL) 20 MG tablet   Other Relevant Orders  CBC with Differential/Platelet   Comprehensive metabolic panel     Genitourinary   Benign prostatic hyperplasia with lower urinary tract symptoms    Under good control on current regimen. Continue current regimen. Continue to monitor. Call with any concerns. Refills given. Labs drawn today.       Relevant Orders   CBC with Differential/Platelet   Comprehensive metabolic panel   PSA     Other   Hyperlipidemia    Rechecking labs today. Await results. Treat as needed.       Relevant Medications   lisinopril (ZESTRIL) 20 MG tablet   Other Relevant Orders   CBC with Differential/Platelet   Comprehensive metabolic panel   Lipid Panel w/o Chol/HDL Ratio   Vitamin D deficiency    Rechecking labs today. Await results. Treat as needed.       Relevant Orders   CBC with Differential/Platelet   Comprehensive metabolic panel   VITAMIN D 25 Hydroxy (Vit-D Deficiency, Fractures)   Irritability    Under good control on current regimen. Continue current regimen. Continue to monitor. Call with any concerns. Refills given.        Relevant Orders   CBC with Differential/Platelet   Comprehensive metabolic panel   Constipation    Currently well controlled on amitiza. Has been getting this through [facility]. Will refer to pharmacy for help completing the paperwork. Call with any concerns.       Relevant Orders   AMB Referral to Pharmacy Medication Management     Follow up plan: Return in about 6 months (around 10/23/2023) for physical.

## 2023-04-25 NOTE — Assessment & Plan Note (Signed)
Under good control on current regimen. Continue current regimen. Continue to monitor. Call with any concerns. Refills given.   

## 2023-04-25 NOTE — Assessment & Plan Note (Signed)
Under good control on current regimen. Continue current regimen. Continue to monitor. Call with any concerns. Refills given. Labs drawn today.   

## 2023-04-26 ENCOUNTER — Encounter: Payer: Self-pay | Admitting: Family Medicine

## 2023-04-26 LAB — CBC WITH DIFFERENTIAL/PLATELET
Basophils Absolute: 0.1 10*3/uL (ref 0.0–0.2)
Basos: 1 %
EOS (ABSOLUTE): 0.2 10*3/uL (ref 0.0–0.4)
Eos: 3 %
Hematocrit: 48.6 % (ref 37.5–51.0)
Hemoglobin: 16.3 g/dL (ref 13.0–17.7)
Immature Grans (Abs): 0 10*3/uL (ref 0.0–0.1)
Immature Granulocytes: 1 %
Lymphocytes Absolute: 2.5 10*3/uL (ref 0.7–3.1)
Lymphs: 38 %
MCH: 30.4 pg (ref 26.6–33.0)
MCHC: 33.5 g/dL (ref 31.5–35.7)
MCV: 91 fL (ref 79–97)
Monocytes Absolute: 0.7 10*3/uL (ref 0.1–0.9)
Monocytes: 11 %
Neutrophils Absolute: 3 10*3/uL (ref 1.4–7.0)
Neutrophils: 46 %
Platelets: 171 10*3/uL (ref 150–450)
RBC: 5.36 x10E6/uL (ref 4.14–5.80)
RDW: 13.7 % (ref 11.6–15.4)
WBC: 6.5 10*3/uL (ref 3.4–10.8)

## 2023-04-26 LAB — COMPREHENSIVE METABOLIC PANEL
ALT: 60 IU/L — ABNORMAL HIGH (ref 0–44)
AST: 55 IU/L — ABNORMAL HIGH (ref 0–40)
Albumin: 4.5 g/dL (ref 3.9–4.9)
Alkaline Phosphatase: 68 IU/L (ref 44–121)
BUN/Creatinine Ratio: 14 (ref 10–24)
BUN: 14 mg/dL (ref 8–27)
Bilirubin Total: 0.5 mg/dL (ref 0.0–1.2)
CO2: 20 mmol/L (ref 20–29)
Calcium: 9.6 mg/dL (ref 8.6–10.2)
Chloride: 104 mmol/L (ref 96–106)
Creatinine, Ser: 1 mg/dL (ref 0.76–1.27)
Globulin, Total: 2.7 g/dL (ref 1.5–4.5)
Glucose: 113 mg/dL — ABNORMAL HIGH (ref 70–99)
Potassium: 4.4 mmol/L (ref 3.5–5.2)
Sodium: 140 mmol/L (ref 134–144)
Total Protein: 7.2 g/dL (ref 6.0–8.5)
eGFR: 84 mL/min/{1.73_m2} (ref >59)

## 2023-04-26 LAB — PSA: Prostate Specific Ag, Serum: 0.2 ng/mL (ref 0.0–4.0)

## 2023-04-26 LAB — LIPID PANEL W/O CHOL/HDL RATIO
Cholesterol, Total: 170 mg/dL (ref 100–199)
HDL: 39 mg/dL — ABNORMAL LOW (ref >39)
LDL Chol Calc (NIH): 106 mg/dL — ABNORMAL HIGH (ref 0–99)
Triglycerides: 137 mg/dL (ref 0–149)
VLDL Cholesterol Cal: 25 mg/dL (ref 5–40)

## 2023-04-26 LAB — VITAMIN D 25 HYDROXY (VIT D DEFICIENCY, FRACTURES): Vit D, 25-Hydroxy: 38.6 ng/mL (ref 30.0–100.0)

## 2023-04-27 ENCOUNTER — Telehealth: Payer: Self-pay

## 2023-04-27 NOTE — Progress Notes (Signed)
   Care Guide Note  04/27/2023 Name: Derrick Sandoval. MRN: [id number] DOB: 12-Jul-1956  Referred by: Dorcas Carrow, DO Reason for referral : Care Coordination (Outreach to schedule with Pharm d )   Derrick Martha. is a 67 y.o. year old male who is a primary care patient of Dorcas Carrow, DO. Derrick Lunch. was referred to the pharmacist for assistance related to HTN and HLD.    Successful contact was made with the patient to discuss pharmacy services including being ready for the pharmacist to call at least 5 minutes before the scheduled appointment time, to have medication bottles and any blood sugar or blood pressure readings ready for review. The patient agreed to meet with the pharmacist via with the pharmacist via telephone visit on (date/time).  05/03/2023  Derrick Sandoval, RMA Care Guide [facility]  [place], [place] [address] Direct Dial: [phone] [email or web address]

## 2023-05-02 ENCOUNTER — Encounter: Payer: Self-pay | Admitting: Family Medicine

## 2023-05-03 ENCOUNTER — Encounter: Payer: Self-pay | Admitting: Family Medicine

## 2023-05-03 ENCOUNTER — Telehealth: Payer: Self-pay

## 2023-05-03 ENCOUNTER — Telehealth: Payer: Self-pay | Admitting: Family Medicine

## 2023-05-03 ENCOUNTER — Other Ambulatory Visit: Payer: PPO

## 2023-05-03 MED ORDER — LUBIPROSTONE 8 MCG PO CAPS: 8.0000 ug | ORAL_CAPSULE | Freq: Two times a day (BID) | ORAL | 1 refills | Status: DC

## 2023-05-03 NOTE — Progress Notes (Signed)
05/03/2023  Patient ID: Derrick Sandoval., male   DOB: Aug 02, 1956, 67 y.o.   MRN: [id number]  Order signed by Dr. Laural Benes for lubiprostone BID.  Contacted [facility] to check patient's copay.  At this point with his current part D coverage, a 3 month supply would cost $200.  This would not be affordable for the patient.  Current [facility] PAP does offer Motegrigy assistance.  [employer] also has PAP through [employer] that patient would qualify, but it appears he has tried Linzess before; and low does was not effective but higher doses caused diarrhea.  Consulting Dr. Laural Benes and patient's GI provider to develop plan.  Of note, patient will have enough Amitiza through at least the end of 2024.  Telephone visit scheduled to follow-up with patient December 10th.  Lenna Gilford, PharmD, DPLA

## 2023-05-03 NOTE — Telephone Encounter (Signed)
Pt is calling in requesting Elnita Maxwell call him back.

## 2023-05-03 NOTE — Progress Notes (Signed)
05/03/2023  Patient ID: Derrick Sandoval., male   DOB: [birth year], 66 y.o.   MRN: [id number]  Telephone visit to assist with access/affordability of Amitiza.  Patient is currently taking Amitiza BID, which he receives at no cost through [place] P[facility].  This medication will no longer be produced after the end of this year, so there will not be a PAP available.  The generic formulation, lubiprostone, is FDA approved and available commercially.  It also appears to be covered by the patient's insurance plan.  Pending an order for lubiprostone BID for Dr. Laural Benes to sign.  Once signed, I will contact pharmacy to check patient's copay.  Pharmacy can place order on hold until he needs, because he will continue to receive Amizita through PAP until EOY.  Lenna Gilford, PharmD, DPLA

## 2023-05-05 NOTE — Telephone Encounter (Signed)
I got he message in the inbasket for the [facility] pool, but it looks like it was 2 days ago.  My apologies, not sure why it didn't show up in the basket until yesterday.

## 2023-05-26 ENCOUNTER — Ambulatory Visit: Payer: PPO | Admitting: Family Medicine

## 2023-06-06 ENCOUNTER — Ambulatory Visit (INDEPENDENT_AMBULATORY_CARE_PROVIDER_SITE_OTHER): Payer: PPO | Admitting: Family Medicine

## 2023-06-06 VITALS — BP 134/87 | HR 85 | Temp 98.0°F | Ht 67.5 in | Wt 280.0 lb

## 2023-06-06 DIAGNOSIS — R0981 Nasal congestion: Secondary | ICD-10-CM

## 2023-06-06 MED ORDER — PREDNISONE 50 MG PO TABS: 50.0000 mg | ORAL_TABLET | Freq: Every day | ORAL | 0 refills | Status: DC

## 2023-06-06 MED ORDER — TRIAMCINOLONE ACETONIDE 40 MG/ML IJ SUSP
40.0000 mg | Freq: Once | INTRAMUSCULAR | Status: AC
  Administered 2023-06-06: 40 mg via INTRAMUSCULAR

## 2023-06-06 NOTE — Progress Notes (Signed)
BP 134/87 (BP Location: Right Arm, Patient Position: Sitting, Cuff Size: Large)   Pulse 85   Temp 98 F (36Sandoval7 C) (Oral)   Ht 5' 7Sandoval5" (1Sandoval715 m)   Wt 280 lb (127 kg)   SpO2 96%   BMI 43Sandoval21 kg/m    Subjective:    Patient ID: Derrick Lunch., male    DOB: 12-18-1955, 67 y[place]ovalo.   MRN: [id number]  HPI: Derrick Zahra. is a 67 ySandovalo. male  Chief Complaint  Patient presents with   Headache    Headache and buzzing in bilateral ears, has been going on intermittently for a week. When taking allergy medication, it makes it worse, Ibuprofen/ tylenol mix helps but always comes back. Would also like to discuss folliculitis on bilateral legs    He notes that his head has been hurting for about a month. He notes that tylenol and ibuprofen helps a little bit but not much. He notes that he had a temporal and forehead headache. Pain does not radiate. He notes that it started around the time that he got his covid and flu shot. He notes that his ears are buzzing. He notes that it's in both ears. Worse with taking allergy pills. Came on pretty suddenly. It has been coming and going. He notes that some days he feels good but others, he feels bad. He notes that he doesn't have any motivation. He does not feel like he's been more depressed. No other concerns or complaints at this time.   Relevant past medical, surgical, family and social history reviewed and updated as indicated. Interim medical history since our last visit reviewed. Allergies and medications reviewed and updated.  Review of Systems  Constitutional: Negative.   HENT:  Positive for congestion, ear pain, sinus pressure, sinus pain and tinnitus. Negative for dental problem, drooling, ear discharge, facial swelling, hearing loss, mouth sores, nosebleeds, postnasal drip, rhinorrhea, sneezing, sore throat, trouble swallowing and voice change.   Respiratory: Negative.    Cardiovascular: Negative.   Gastrointestinal: Negative.    Musculoskeletal: Negative.   Skin: Negative.   Neurological:  Positive for headaches. Negative for dizziness, tremors, seizures, syncope, facial asymmetry, speech difficulty, weakness, light-headedness and numbness.  Psychiatric/Behavioral: Negative.      Per HPI unless specifically indicated above     Objective:    BP 134/87 (BP Location: Right Arm, Patient Position: Sitting, Cuff Size: Large)   Pulse 85   Temp 98 F (36Sandoval7 C) (Oral)   Ht 5' 7Sandoval5" (1Sandoval715 m)   Wt 280 lb (127 kg)   SpO2 96%   BMI 43Sandoval21 kg/m   Wt Readings from Last 3 Encounters:  06/06/23 280 lb (127 kg)  04/25/23 278 lb (126Sandoval1 kg)  01/04/23 277 lb 12Sandoval8 oz (126 kg)    Physical Exam Vitals and nursing note reviewed.  Constitutional:      General: He is not in acute distress.    Appearance: Normal appearance. He is well-developed. He is obese. He is not ill-appearing, toxic-appearing or diaphoretic.  HENT:     Head: Normocephalic and atraumatic.     Right Ear: External ear normal.     Left Ear: External ear normal.     Nose: Nose normal.     Mouth/Throat:     Mouth: Mucous membranes are moist.     Pharynx: Oropharynx is clear.  Eyes:     General: No scleral icterus.       Right eye: No discharge.  Left eye: No discharge.     Extraocular Movements: Extraocular movements intact.     Conjunctiva/sclera: Conjunctivae normal.     Pupils: Pupils are equal, round, and reactive to light.  Cardiovascular:     Rate and Rhythm: Normal rate and regular rhythm.     Pulses: Normal pulses.     Heart sounds: Normal heart sounds. No murmur heard.    No friction rub. No gallop.  Pulmonary:     Effort: Pulmonary effort is normal. No respiratory distress.     Breath sounds: Normal breath sounds. No stridor. No wheezing, rhonchi or rales.  Chest:     Chest wall: No tenderness.  Musculoskeletal:        General: Normal range of motion.     Cervical back: Normal range of motion and neck supple.  Skin:    General:  Skin is warm and dry.     Capillary Refill: Capillary refill takes less than 2 seconds.     Coloration: Skin is not jaundiced or pale.     Findings: No bruising, erythema, lesion or rash.  Neurological:     General: No focal deficit present.     Mental Status: He is alert and oriented to person, place, and time. Mental status is at baseline.  Psychiatric:        Mood and Affect: Mood normal.        Behavior: Behavior normal.        Thought Content: Thought content normal.        Judgment: Judgment normal.     Results for orders placed or performed in visit on 04/25/23  CBC with Differential/Platelet  Result Value Ref Range   WBC 6Sandoval5 3Sandoval4 - 10Sandoval8 x10E3/uL   RBC 5Sandoval36 4Sandoval14 - 5Sandoval80 x10E6/uL   Hemoglobin 16Sandoval3 13Sandoval0 - 17Sandoval7 g/dL   Hematocrit 86Sandoval5 78Sandoval4 - 51Sandoval0 %   MCV 91 79 - 97 fL   MCH 30Sandoval4 26Sandoval6 - 33Sandoval0 pg   MCHC 33Sandoval5 31Sandoval5 - 35Sandoval7 g/dL   RDW 69Sandoval6 29Sandoval5 - 28Sandoval4 %   Platelets 171 150 - 450 x10E3/uL   Neutrophils 46 Not Estab. %   Lymphs 38 Not Estab. %   Monocytes 11 Not Estab. %   Eos 3 Not Estab. %   Basos 1 Not Estab. %   Neutrophils Absolute 3Sandoval0 1Sandoval4 - 7Sandoval0 x10E3/uL   Lymphocytes Absolute 2Sandoval5 0Sandoval7 - 3Sandoval1 x10E3/uL   Monocytes Absolute 0Sandoval7 0Sandoval1 - 0Sandoval9 x10E3/uL   EOS (ABSOLUTE) 0Sandoval2 0Sandoval0 - 0Sandoval4 x10E3/uL   Basophils Absolute 0Sandoval1 0Sandoval0 - 0Sandoval2 x10E3/uL   Immature Granulocytes 1 Not Estab. %   Immature Grans (Abs) 0Sandoval0 0Sandoval0 - 0Sandoval1 x10E3/uL  Comprehensive metabolic panel  Result Value Ref Range   Glucose 113 (H) 70 - 99 mg/dL   BUN 14 8 - 27 mg/dL   Creatinine, Ser 1Sandoval32 0Sandoval76 - 1Sandoval27 mg/dL   eGFR 84 >44 WN/UUV/2Sandoval53   BUN/Creatinine Ratio 14 10 - 24   Sodium 140 134 - 144 mmol/L   Potassium 4Sandoval4 3Sandoval5 - 5Sandoval2 mmol/L   Chloride 104 96 - 106 mmol/L   CO2 20 20 - 29 mmol/L   Calcium 9Sandoval6 8Sandoval6 - 10Sandoval2 mg/dL   Total Protein 7Sandoval2 6Sandoval0 - 8Sandoval5 g/dL   Albumin 4Sandoval5 3Sandoval9 - 4Sandoval9 g/dL   Globulin, Total 2Sandoval7 1Sandoval5 - 4Sandoval5 g/dL   Bilirubin Total 0Sandoval5 0Sandoval0 - 1Sandoval2 mg/dL   Alkaline Phosphatase 68 44 - 121 IU/L   AST 55 (H) 0 - 40  IU/L    ALT 60 (H) 0 - 44 IU/L  Lipid Panel w/o Chol/HDL Ratio  Result Value Ref Range   Cholesterol, Total 170 100 - 199 mg/dL   Triglycerides 440 0 - 149 mg/dL   HDL 39 (L) >34 mg/dL   VLDL Cholesterol Cal 25 5 - 40 mg/dL   LDL Chol Calc (NIH) 742 (H) 0 - 99 mg/dL  VITAMIN D 25 Hydroxy (Vit-D Deficiency, Fractures)  Result Value Ref Range   Vit D, 25-Hydroxy 38Sandoval6 30Sandoval0 - 100Sandoval0 ng/mL  PSA  Result Value Ref Range   Prostate Specific Ag, Serum 0Sandoval2 0Sandoval0 - 4Sandoval0 ng/mL      Assessment & Plan:   Problem List Items Addressed This Visit   None Visit Diagnoses     Nasal congestion    -  Primary   Will treat with triamcinalone shot and steroid burst. Call if not getting better or getting worse. Continue to monitor.   Relevant Medications   triamcinolone acetonide (KENALOG-40) injection 40 mg (Start on 06/06/2023  4:15 PM)        Follow up plan: Return if symptoms worsen or fail to improve.

## 2023-06-22 ENCOUNTER — Encounter: Payer: Self-pay | Admitting: Family Medicine

## 2023-06-22 ENCOUNTER — Ambulatory Visit (INDEPENDENT_AMBULATORY_CARE_PROVIDER_SITE_OTHER): Payer: PPO | Admitting: Family Medicine

## 2023-06-22 ENCOUNTER — Ambulatory Visit: Payer: Self-pay

## 2023-06-22 VITALS — BP 131/88 | HR 70 | Temp 98.7°F | Ht 68.0 in | Wt 274.6 lb

## 2023-06-22 DIAGNOSIS — L239 Allergic contact dermatitis, unspecified cause: Secondary | ICD-10-CM

## 2023-06-22 MED ORDER — PREDNISONE 10 MG PO TABS: ORAL_TABLET | ORAL | 0 refills | Status: DC

## 2023-06-22 MED ORDER — TRIAMCINOLONE ACETONIDE 40 MG/ML IJ SUSP
40.0000 mg | Freq: Once | INTRAMUSCULAR | Status: AC
  Administered 2023-06-22: 40 mg via INTRAMUSCULAR

## 2023-06-22 NOTE — Telephone Encounter (Signed)
  Chief Complaint: rash to left forearm and collar line Symptoms: itching Frequency: 2 days Pertinent Negatives: Patient denies fever Disposition: [] ED /[] Urgent Care (no appt availability in office) / [x] Appointment(In office/virtual)/ []  [facility]/ [] Home Care/ [] Refused Recommended Disposition /[] [facility]/ []  Follow-up with PCP Additional Notes: possible exposure to poison ivy from his dogs Reason for Disposition . [1] Severe localized itching AND [2] after 2 days of steroid cream  Answer Assessment - Initial Assessment Questions 1. APPEARANCE of RASH: "Describe the rash."      red 2. LOCATION: "Where is the rash located?"      Left forearm, collar line  5. ONSET: "When did the rash start?"      2 days  6. ITCHING: "Does the rash itch?" If Yes, ask: "How bad is the itch?"  (Scale 0-10; or none, mild, moderate, severe)     severe 7. PAIN: "Does the rash hurt?" If Yes, ask: "How bad is the pain?"  (Scale 0-10; or none, mild, moderate, severe)    - NONE (0): no pain    - MILD (1-3): doesn't interfere with normal activities     - MODERATE (4-7): interferes with normal activities or awakens from sleep     - SEVERE (8-10): excruciating pain, unable to do any normal activities     no 8. OTHER SYMPTOMS: "Do you have any other symptoms?" (e.g., fever)     no 9. PREGNANCY: "Is there any chance you are pregnant?" "When was your last menstrual period?"     N/a  Protocols used: Rash or Redness - Localized-A-AH

## 2023-06-22 NOTE — Progress Notes (Signed)
BP 131/88 (BP Location: Left Arm, Patient Position: Sitting, Cuff Size: Large)   Pulse 70   Temp 98.7 F (37.1 C) (Oral)   Ht 5\' 8"  (1.727 m)   Wt 274 lb 9.6 oz (124.6 kg)   SpO2 96%   BMI 41.75 kg/m    Subjective:    Patient ID: Derrick Lunch., male    DOB: [birth year], 67 y.o.   MRN: [id number]  HPI: Derrick Goldston. is a 67 y.o. male  Chief Complaint  Patient presents with   Rash    Left forearm and around collar of neck, thinks it maybe due to puppy, has been using triamcinolone cream    RASH Duration:  about a week ago Location: arms and trunk  Itching: yes Burning: yes Redness: yes Oozing: no Scaling: yes Blisters: yes Painful: no Fevers: no Change in detergents/soaps/personal care products: no Recent illness: no Recent travel:no History of same: yes Context: better Alleviating factors: triamcinalone ointment Treatments attempted:triamcinalone ointment Shortness of breath: no  Throat/tongue swelling: no Myalgias/arthralgias: no  Relevant past medical, surgical, family and social history reviewed and updated as indicated. Interim medical history since our last visit reviewed. Allergies and medications reviewed and updated.  Review of Systems  Constitutional: Negative.   Respiratory: Negative.    Cardiovascular: Negative.   Musculoskeletal: Negative.   Skin:  Positive for rash. Negative for color change, pallor and wound.  Psychiatric/Behavioral: Negative.      Per HPI unless specifically indicated above     Objective:    BP 131/88 (BP Location: Left Arm, Patient Position: Sitting, Cuff Size: Large)   Pulse 70   Temp 98.7 F (37.1 C) (Oral)   Ht 5\' 8"  (1.727 m)   Wt 274 lb 9.6 oz (124.6 kg)   SpO2 96%   BMI 41.75 kg/m   Wt Readings from Last 3 Encounters:  06/22/23 274 lb 9.6 oz (124.6 kg)  06/06/23 280 lb (127 kg)  04/25/23 278 lb (126.1 kg)    Physical Exam Vitals and nursing note reviewed.  Constitutional:      General: He  is not in acute distress.    Appearance: Normal appearance. He is not ill-appearing, toxic-appearing or diaphoretic.  HENT:     Head: Normocephalic and atraumatic.     Right Ear: External ear normal.     Left Ear: External ear normal.     Nose: Nose normal.     Mouth/Throat:     Mouth: Mucous membranes are moist.     Pharynx: Oropharynx is clear.  Eyes:     General: No scleral icterus.       Right eye: No discharge.        Left eye: No discharge.     Extraocular Movements: Extraocular movements intact.     Conjunctiva/sclera: Conjunctivae normal.     Pupils: Pupils are equal, round, and reactive to light.  Cardiovascular:     Rate and Rhythm: Normal rate and regular rhythm.     Pulses: Normal pulses.     Heart sounds: Normal heart sounds. No murmur heard.    No friction rub. No gallop.  Pulmonary:     Effort: Pulmonary effort is normal. No respiratory distress.     Breath sounds: Normal breath sounds. No stridor. No wheezing, rhonchi or rales.  Chest:     Chest wall: No tenderness.  Musculoskeletal:        General: Normal range of motion.     Cervical back: Normal  range of motion and neck supple.  Skin:    General: Skin is warm and dry.     Capillary Refill: Capillary refill takes less than 2 seconds.     Coloration: Skin is not jaundiced or pale.     Findings: Rash present. No bruising, erythema or lesion.  Neurological:     General: No focal deficit present.     Mental Status: He is alert and oriented to person, place, and time. Mental status is at baseline.  Psychiatric:        Mood and Affect: Mood normal.        Behavior: Behavior normal.        Thought Content: Thought content normal.        Judgment: Judgment normal.     Results for orders placed or performed in visit on 04/25/23  CBC with Differential/Platelet  Result Value Ref Range   WBC 6.5 3.4 - 10.8 x10E3/uL   RBC 5.36 4.14 - 5.80 x10E6/uL   Hemoglobin 16.3 13.0 - 17.7 g/dL   Hematocrit 13.0 86.5 -  51.0 %   MCV 91 79 - 97 fL   MCH 30.4 26.6 - 33.0 pg   MCHC 33.5 31.5 - 35.7 g/dL   RDW 78.4 69.6 - 29.5 %   Platelets 171 150 - 450 x10E3/uL   Neutrophils 46 Not Estab. %   Lymphs 38 Not Estab. %   Monocytes 11 Not Estab. %   Eos 3 Not Estab. %   Basos 1 Not Estab. %   Neutrophils Absolute 3.0 1.4 - 7.0 x10E3/uL   Lymphocytes Absolute 2.5 0.7 - 3.1 x10E3/uL   Monocytes Absolute 0.7 0.1 - 0.9 x10E3/uL   EOS (ABSOLUTE) 0.2 0.0 - 0.4 x10E3/uL   Basophils Absolute 0.1 0.0 - 0.2 x10E3/uL   Immature Granulocytes 1 Not Estab. %   Immature Grans (Abs) 0.0 0.0 - 0.1 x10E3/uL  Comprehensive metabolic panel  Result Value Ref Range   Glucose 113 (H) 70 - 99 mg/dL   BUN 14 8 - 27 mg/dL   Creatinine, Ser 2.84 0.76 - 1.27 mg/dL   eGFR 84 >13 KG/MWN/0.27   BUN/Creatinine Ratio 14 10 - 24   Sodium 140 134 - 144 mmol/L   Potassium 4.4 3.5 - 5.2 mmol/L   Chloride 104 96 - 106 mmol/L   CO2 20 20 - 29 mmol/L   Calcium 9.6 8.6 - 10.2 mg/dL   Total Protein 7.2 6.0 - 8.5 g/dL   Albumin 4.5 3.9 - 4.9 g/dL   Globulin, Total 2.7 1.5 - 4.5 g/dL   Bilirubin Total 0.5 0.0 - 1.2 mg/dL   Alkaline Phosphatase 68 44 - 121 IU/L   AST 55 (H) 0 - 40 IU/L   ALT 60 (H) 0 - 44 IU/L  Lipid Panel w/o Chol/HDL Ratio  Result Value Ref Range   Cholesterol, Total 170 100 - 199 mg/dL   Triglycerides 253 0 - 149 mg/dL   HDL 39 (L) >66 mg/dL   VLDL Cholesterol Cal 25 5 - 40 mg/dL   LDL Chol Calc (NIH) 440 (H) 0 - 99 mg/dL  VITAMIN D 25 Hydroxy (Vit-D Deficiency, Fractures)  Result Value Ref Range   Vit D, 25-Hydroxy 38.6 30.0 - 100.0 ng/mL  PSA  Result Value Ref Range   Prostate Specific Ag, Serum 0.2 0.0 - 4.0 ng/mL      Assessment & Plan:   Problem List Items Addressed This Visit   None Visit Diagnoses     Allergic  contact dermatitis, unspecified trigger    -  Primary   Will treat with triamcinalone and prednisone taper. Call if not getting better or getting worse.   Relevant Medications    triamcinolone acetonide (KENALOG-40) injection 40 mg        Follow up plan: Return if symptoms worsen or fail to improve.

## 2023-06-23 ENCOUNTER — Ambulatory Visit (INDEPENDENT_AMBULATORY_CARE_PROVIDER_SITE_OTHER): Payer: PPO | Admitting: Emergency Medicine

## 2023-06-23 VITALS — Ht 68.0 in | Wt 270.0 lb

## 2023-06-23 DIAGNOSIS — Z0000 Encounter for general adult medical examination without abnormal findings: Secondary | ICD-10-CM | POA: Diagnosis not present

## 2023-06-23 NOTE — Progress Notes (Signed)
Subjective:   Derrick Sandoval. is a 67 y.o. male who presents for Medicare Annual/Subsequent preventive examination.  Visit Complete: Virtual I connected with  Derrick Sandoval. on 06/23/23 by a audio enabled telemedicine application and verified that I am speaking with the correct person using two identifiers.  Patient Location: Home  Provider Location: Home Office  I discussed the limitations of evaluation and management by telemedicine. The patient expressed understanding and agreed to proceed.  Vital Signs: Because this visit was a virtual/telehealth visit, some criteria may be missing or patient reported. Any vitals not documented were not able to be obtained and vitals that have been documented are patient reported.   Cardiac Risk Factors include: advanced age (>58men, >63 women);male gender;hypertension;dyslipidemia;obesity (BMI >30kg/m2)     Objective:    Today's Vitals   06/23/23 1022  Weight: 270 lb (122.5 kg)  Height: 5\' 8"  (1.727 m)   Body mass index is 41.05 kg/m.     06/23/2023   10:34 AM 06/21/2022    9:07 AM 06/19/2021    9:01 AM 06/16/2020    9:50 AM 06/06/2019    2:39 PM 06/01/2018    9:24 AM 05/29/2018    1:15 PM  Advanced Directives  Does Patient Have a Medical Advance Directive? No No No No No Yes No  Would patient like information on creating a medical advance directive? Yes (MAU/Ambulatory/Procedural Areas - Information given)      No - Patient declined    Current Medications (verified) Outpatient Encounter Medications as of 06/23/2023  Medication Sig   acyclovir (ZOVIRAX) 400 MG tablet Take 1 tablet (400 mg total) by mouth 2 (two) times daily.   albuterol (VENTOLIN HFA) 108 (90 Base) MCG/ACT inhaler Inhale 1-2 puffs into the lungs every 6 (six) hours as needed for wheezing or shortness of breath.   Ascorbic Acid (VITAMIN C) 1000 MG tablet Take 1,000 mg by mouth at bedtime.    aspirin EC 81 MG tablet Take 81 mg by mouth daily.   azelastine  (ASTELIN) 0.1 % nasal spray SMARTSIG:1-2 Spray(s) Both Nares Twice Daily   Budeson-Glycopyrrol-Formoterol (BREZTRI AEROSPHERE) 160-9-4.8 MCG/ACT AERO Inhale 2 puffs into the lungs 2 (two) times daily.   cetirizine (ZYRTEC) 10 MG tablet Take 1 tablet (10 mg total) by mouth daily.   cholecalciferol (VITAMIN D) 1000 UNITS tablet Take 1,000 Units by mouth daily.    cyclobenzaprine (FLEXERIL) 10 MG tablet Take 1 tablet (10 mg total) by mouth 3 (three) times daily as needed. for muscle spams   diclofenac Sodium (VOLTAREN) 1 % GEL Apply 4 g topically 4 (four) times daily.   famotidine (PEPCID) 20 MG tablet Take 1 tablet (20 mg total) by mouth 2 (two) times daily.   fluticasone (FLONASE) 50 MCG/ACT nasal spray Place 2 sprays into both nostrils daily.   guaiFENesin (MUCINEX) 600 MG 12 hr tablet Take 1 tablet (600 mg total) by mouth 2 (two) times daily.   lisinopril (ZESTRIL) 20 MG tablet Take 1 tablet (20 mg total) by mouth daily.   lubiprostone (AMITIZA) 8 MCG capsule Take 1 capsule (8 mcg total) by mouth 2 (two) times daily with a meal.   methocarbamol (ROBAXIN) 500 MG tablet Take 500 mg by mouth daily as needed.   montelukast (SINGULAIR) 10 MG tablet Take 1 tablet (10 mg total) by mouth at bedtime.   Multiple Vitamins-Minerals (MULTIVITAMIN WITH MINERALS) tablet Take 1 tablet by mouth daily.   mupirocin ointment (BACTROBAN) 2 % Apply 1 Application  topically 2 (two) times daily.   naproxen sodium (ALEVE) 220 MG tablet Take 220 mg by mouth daily as needed.   nystatin cream (MYCOSTATIN) Apply 1 Application topically 2 (two) times daily.   omeprazole (PRILOSEC) 40 MG capsule Take 1 capsule (40 mg total) by mouth daily.   predniSONE (DELTASONE) 10 MG tablet Take 6 in the AM day 1, then 5 day 2, decrease by 1 daily until gone   sertraline (ZOLOFT) 100 MG tablet Take 1 tablet (100 mg total) by mouth daily.   tamsulosin (FLOMAX) 0.4 MG CAPS capsule Take 1 capsule (0.4 mg total) by mouth daily.   triamcinolone  cream (KENALOG) 0.5 % Apply 1 Application topically 3 (three) times daily.   nystatin (MYCOSTATIN/NYSTOP) powder Apply 1 Application topically 3 (three) times daily. (Patient not taking: Reported on 06/23/2023)   tadalafil (CIALIS) 5 MG tablet TAKE 1 TABLET BY MOUTH ONCE DAILY AS NEEDED FOR BPH (Patient not taking: Reported on 06/23/2023)   triamcinolone ointment (KENALOG) 0.1 % Apply 1 application topically 2 (two) times daily. (Patient not taking: Reported on 06/23/2023)   No facility-administered encounter medications on file as of 06/23/2023.    Allergies (verified) Patient has no known allergies.   History: Past Medical History:  Diagnosis Date   Allergy    Arthritis    Cervical radiculopathy 01/16/2021   Cough    nonproductive no fever saw at dr 05-26-18   GERD (gastroesophageal reflux disease)    Hyperlipidemia    Hypertension    Meningitis due to viruses 1968   Past Surgical History:  Procedure Laterality Date   COLONOSCOPY WITH PROPOFOL N/A 05/12/2018   Procedure: COLONOSCOPY WITH PROPOFOL;  Surgeon: Wyline Mood, MD;  Location[facility] ENDOSCOPY;  Service: Gastroenterology;  Laterality: N/A;   ESOPHAGOGASTRODUODENOSCOPY (EGD) WITH PROPOFOL N/A 05/12/2018   Procedure: ESOPHAGOGASTRODUODENOSCOPY (EGD) WITH PROPOFOL;  Surgeon: Wyline Mood, MD;  Location[facility] ENDOSCOPY;  Service: Gastroenterology;  Laterality: N/A;   ESOPHAGOGASTRODUODENOSCOPY (EGD) WITH PROPOFOL N/A 06/01/2018   Procedure: ESOPHAGOGASTRODUODENOSCOPY (EGD) WITH PROPOFOL;  Surgeon: Rachael Fee, MD;  Location: [facility] ENDOSCOPY;  Service: Endoscopy;  Laterality: N/A;   EUS N/A 06/01/2018   Procedure: UPPER ENDOSCOPIC ULTRASOUND (EUS) RADIAL;  Surgeon: Rachael Fee, MD;  Location: [facility] ENDOSCOPY;  Service: Endoscopy;  Laterality: N/A;   JOINT REPLACEMENT Bilateral    NASAL SINUS SURGERY     TOTAL KNEE ARTHROPLASTY     Family History  Problem Relation Age of Onset   Breast cancer Mother    Cancer Father         unsure, passed away before it was confirmed   Pancreatic cancer Daughter    Diabetes Neg Hx    Heart disease Neg Hx    Hypertension Neg Hx    Stroke Neg Hx    COPD Neg Hx    Colon cancer Neg Hx    Social History   Socioeconomic History   Marital status: Widowed    Spouse name: Not on file   Number of children: 1   Years of education: 10th grade, GED   Highest education level: GED or equivalent  Occupational History   Occupation: disabled   Tobacco Use   Smoking status: Never    Passive exposure: Never   Smokeless tobacco: Never  Vaping Use   Vaping status: Never Used  Substance and Sexual Activity   Alcohol use: Not Currently    Alcohol/week: 0.0 standard drinks of alcohol    Comment: occasionally, twice a year  Drug use: No   Sexual activity: Not Currently  Other Topics Concern   Not on file  Social History Narrative   Not on file   Social Determinants of Health   Financial Resource Strain: Low Risk  (06/23/2023)   Overall Financial Resource Strain (CARDIA)    Difficulty of Paying Living Expenses: Not hard at all  Food Insecurity: No Food Insecurity (06/23/2023)   Hunger Vital Sign    Worried About Running Out of Food in the Last Year: Never true    Ran Out of Food in the Last Year: Never true  Transportation Needs: No Transportation Needs (06/23/2023)   PRAPARE - Administrator, Civil Service (Medical): No    Lack of Transportation (Non-Medical): No  Physical Activity: Inactive (06/23/2023)   Exercise Vital Sign    Days of Exercise per Week: 0 days    Minutes of Exercise per Session: 0 min  Stress: No Stress Concern Present (06/23/2023)   [employer] of Occupational Health - Occupational Stress Questionnaire    Feeling of Stress : Only a little  Social Connections: Socially Isolated (06/23/2023)   Social Connection and Isolation Panel [NHANES]    Frequency of Communication with Friends and Family: Once a week    Frequency of Social Gatherings  with Friends and Family: More than three times a week    Attends Religious Services: Never    Database administrator or Organizations: No    Attends Banker Meetings: Never    Marital Status: Widowed    Tobacco Counseling Counseling given: Not Answered   Clinical Intake:  Pre-visit preparation completed: Yes  Pain : No/denies pain     BMI - recorded: 41.05 Nutritional Status: BMI > 30  Obese Nutritional Risks: None Diabetes: No  How often do you need to have someone help you when you read instructions, pamphlets, or other written materials from your doctor or pharmacy?: 1 - Never  Interpreter Needed?: No  Information entered by :: Tora Kindred, CMA   Activities of Daily Living    06/23/2023   10:24 AM  In your present state of health, do you have any difficulty performing the following activities:  Hearing? 0  Vision? 0  Difficulty concentrating or making decisions? 0  Walking or climbing stairs? 0  Dressing or bathing? 0  Doing errands, shopping? 0  Preparing Food and eating ? N  Using the Toilet? N  In the past six months, have you accidently leaked urine? N  Do you have problems with loss of bowel control? N  Managing your Medications? N  Managing your Finances? N  Housekeeping or managing your Housekeeping? N    Patient Care Team: Dorcas Carrow, DO as PCP - General (Family Medicine) Delgaizo, Mcarthur Rossetti, MD as Referring Physician (Orthopedic Surgery) Viprakasit, Achilles Dunk, MD as Referring Physician (Urology) Lenna Gilford, [facility] (Pharmacist)  Indicate any recent Medical Services you may have received from other than [facility] providers in the past year (date may be approximate).     Assessment:   This is a routine wellness examination for Derrick Sandoval.  Hearing/Vision screen Hearing Screening - Comments:: Denies hearing loss Vision Screening - Comments:: Needs an eye exam   Goals Addressed               This Visit's Progress     Patient  Stated (pt-stated)        Get back in the gym and live another year  Depression Screen    06/23/2023   10:32 AM 06/22/2023    8:56 AM 06/06/2023    3:32 PM 04/25/2023   10:49 AM 01/04/2023   10:53 AM 12/02/2022    2:45 PM 10/14/2022   10:47 AM  PHQ 2/9 Scores  PHQ - 2 Score 2 3 0 0 0 3 1  PHQ- 9 Score 2 5 1 6 2 10 2     Fall Risk    06/23/2023   10:35 AM 04/25/2023   10:49 AM 01/04/2023   10:54 AM 12/02/2022    2:44 PM 10/14/2022   10:46 AM  Fall Risk   Falls in the past year? 0 0 0 0 0  Number falls in past yr: 0 0 0 0 0  Injury with Fall? 0 0 0 0 0  Risk for fall due to : No Fall Risks No Fall Risks No Fall Risks No Fall Risks No Fall Risks  Follow up Falls prevention discussed Falls evaluation completed Falls evaluation completed Falls evaluation completed Falls evaluation completed    MEDICARE RISK AT HOME: Medicare Risk at Home Any stairs in or around the home?: No If so, are there any without handrails?: No Home free of loose throw rugs in walkways, pet beds, electrical cords, etc?: Yes Adequate lighting in your home to reduce risk of falls?: Yes Life alert?: No Use of a cane, walker or w/c?: No Grab bars in the bathroom?: No Shower chair or bench in shower?: No Elevated toilet seat or a handicapped toilet?: No  TIMED UP AND GO:  Was the test performed?  No    Cognitive Function:        06/23/2023   10:36 AM 06/21/2022    9:08 AM 06/19/2021    9:03 AM 06/16/2020    9:58 AM 04/21/2018    8:47 AM  6CIT Screen  What Year? 0 points 0 points 0 points 0 points 0 points  What month? 0 points 0 points 0 points 0 points 0 points  What time? 0 points 0 points 0 points 0 points 0 points  Count back from 20 0 points 0 points 0 points 0 points 0 points  Months in reverse 2 points 0 points 0 points 0 points 0 points  Repeat phrase 0 points 0 points 0 points 0 points 0 points  Total Score 2 points 0 points 0 points 0 points 0 points    Immunizations Immunization History   Administered Date(s) Administered   Influenza, High Dose Seasonal PF 04/21/2022, 04/17/2023   Influenza, Quadrivalent, Recombinant, Inj, Pf 05/29/2021   Influenza,inj,Quad PF,6+ Mos 05/12/2015, 04/29/2016, 05/31/2017, 04/21/2018, 05/04/2019   Influenza-Unspecified 07/28/2015, 05/04/2019, 05/20/2020   Moderna Covid-19 Fall Seasonal Vaccine 29yrs & older 04/17/2023   Moderna Sars-Covid-2 Vaccination 03/29/2020, 04/26/2020   PFIZER(Purple Top)SARS-COV-2 Vaccination 11/09/2019, 11/30/2019, 04/23/2021   PNEUMOCOCCAL CONJUGATE-20 10/14/2022   Respiratory Syncytial Virus Vaccine,Recomb Aduvanted(Arexvy) 04/21/2022   Td 01/23/2016   Tdap 01/05/2012, 10/08/2017   Zoster Recombinant(Shingrix) 05/04/2019, 07/13/2019    TDAP status: Up to date  Flu Vaccine status: Up to date  Pneumococcal vaccine status: Up to date  Covid-19 vaccine status: Declined, Education has been provided regarding the importance of this vaccine but patient still declined. Advised may receive this vaccine at local pharmacy or Health Dept.or vaccine clinic. Aware to provide a copy of the vaccination record if obtained from local pharmacy or Health Dept. Verbalized acceptance and understanding.  Qualifies for Shingles Vaccine? Yes   Zostavax completed No   Shingrix  Completed?: Yes  Screening Tests Health Maintenance  Topic Date Due   COVID-19 Vaccine (7 - 2023-24 season) 06/12/2023   Medicare Annual Wellness (AWV)  06/22/2024   DTaP/Tdap/Td (4 - Td or Tdap) 10/09/2027   Colonoscopy  05/12/2028   Pneumonia Vaccine 41+ Years old  Completed   INFLUENZA VACCINE  Completed   Hepatitis C Screening  Completed   HIV Screening  Completed   Zoster Vaccines- Shingrix  Completed   HPV VACCINES  Aged Out    Health Maintenance  Health Maintenance Due  Topic Date Due   COVID-19 Vaccine (7 - 2023-24 season) 06/12/2023    Colorectal cancer screening: Type of screening: Colonoscopy. Completed 05/02/18. Repeat every 5  years  Lung Cancer Screening: (Low Dose CT Chest recommended if Age 65-80 years, 20 pack-year currently smoking OR have quit w/in 15years.) does not qualify.   Lung Cancer Screening Referral: n/a  Additional Screening:  Hepatitis C Screening: does not qualify; Completed 12/04/20  Vision Screening: Recommended annual ophthalmology exams for early detection of glaucoma and other disorders of the eye.  Dental Screening: Recommended annual dental exams for proper oral hygiene   Community Resource Referral / Chronic Care Management: CRR required this visit?  No   CCM required this visit?  No     Plan:     I have personally reviewed and noted the following in the patient's chart:   Medical and social history Use of alcohol, tobacco or illicit drugs  Current medications and supplements including opioid prescriptions. Patient is not currently taking opioid prescriptions. Functional ability and status Nutritional status Physical activity Advanced directives List of other physicians Hospitalizations, surgeries, and ER visits in previous 12 months Vitals Screenings to include cognitive, depression, and falls Referrals and appointments  In addition, I have reviewed and discussed with patient certain preventive protocols, quality metrics, and best practice recommendations. A written personalized care plan for preventive services as well as general preventive health recommendations were provided to patient.     Tora Kindred, CMA   06/23/2023   After Visit Summary: (MyChart) Due to this being a telephonic visit, the after visit summary with patients personalized plan was offered to patient via MyChart   Nurse Notes:  6 CIT Score - 2 Declined Covid vaccine

## 2023-06-23 NOTE — Patient Instructions (Addendum)
Derrick Sandoval , Thank you for taking time to come for your Medicare Wellness Visit. I appreciate your ongoing commitment to your health goals. Please review the following plan we discussed and let me know if I can assist you in the future.   Referrals/Orders/Follow-Ups/Clinician Recommendations: Recommend getting a routine eye exam as discussed.  This is a list of the screening recommended for you and due dates:  Health Maintenance  Topic Date Due   COVID-19 Vaccine (7 - 2023-24 season) 06/12/2023   Medicare Annual Wellness Visit  06/22/2024   DTaP/Tdap/Td vaccine (4 - Td or Tdap) 10/09/2027   Colon Cancer Screening  05/12/2028   Pneumonia Vaccine  Completed   Flu Shot  Completed   Hepatitis C Screening  Completed   HIV Screening  Completed   Zoster (Shingles) Vaccine  Completed   HPV Vaccine  Aged Out    Advanced directives: (ACP Link)Information on Advanced Care Planning can be found at [facility] Advance Health Care Directives Advance Health Care Directives ([email or web address]   Once you have completed the forms,please bring a copy of your health care power of attorney and living will to the office to be added to your chart at your convenience.   Next Medicare Annual Wellness Visit scheduled for next year: Yes, 07/05/24 @ 10:00am

## 2023-06-24 ENCOUNTER — Ambulatory Visit: Payer: PPO | Admitting: Family Medicine

## 2023-07-07 DIAGNOSIS — N319 Neuromuscular dysfunction of bladder, unspecified: Secondary | ICD-10-CM | POA: Diagnosis not present

## 2023-07-07 DIAGNOSIS — N401 Enlarged prostate with lower urinary tract symptoms: Secondary | ICD-10-CM | POA: Diagnosis not present

## 2023-07-07 DIAGNOSIS — R339 Retention of urine, unspecified: Secondary | ICD-10-CM | POA: Diagnosis not present

## 2023-07-07 DIAGNOSIS — N35919 Unspecified urethral stricture, male, unspecified site: Secondary | ICD-10-CM | POA: Diagnosis not present

## 2023-07-19 ENCOUNTER — Telehealth: Payer: Self-pay | Admitting: Family Medicine

## 2023-07-19 NOTE — Telephone Encounter (Unsigned)
Copied from CRM #[id number]. Topic: General - Other >> Jul 19, 2023  9:33 AM Macon Large wrote: Reason for CRM: Pt called to cancel the appt that is scheduled with Sabino Niemann

## 2023-07-26 ENCOUNTER — Other Ambulatory Visit: Payer: PPO

## 2023-07-28 DIAGNOSIS — R339 Retention of urine, unspecified: Secondary | ICD-10-CM | POA: Diagnosis not present

## 2023-08-24 ENCOUNTER — Other Ambulatory Visit: Payer: Self-pay | Admitting: Family Medicine

## 2023-08-28 ENCOUNTER — Encounter: Payer: Self-pay | Admitting: Family Medicine

## 2023-08-29 ENCOUNTER — Other Ambulatory Visit: Payer: Self-pay

## 2023-08-29 MED ORDER — NYSTATIN 100000 UNIT/GM EX CREA: 1.0000 | TOPICAL_CREAM | Freq: Two times a day (BID) | CUTANEOUS | 3 refills | Status: AC

## 2023-08-29 NOTE — Telephone Encounter (Signed)
 Requested medication (s) are due for refill today: yes  Requested medication (s) are on the active medication list: yes  Last refill:  04/05/22 30 grams   Future visit scheduled: yes  Notes to clinic:  med not assigned to a protocol   Requested Prescriptions  Pending Prescriptions Disp Refills   nystatin  cream (MYCOSTATIN ) [Pharmacy Med Name: Nystatin  100000 UNIT/GM External Cream] 30 g 0    Sig: APPLY  CREAM TOPICALLY TWICE DAILY     Off-Protocol Failed - 08/29/2023  9:09 AM      Failed - Medication not assigned to a protocol, review manually.      Passed - Valid encounter within last 12 months    Recent Outpatient Visits           2 months ago Allergic contact dermatitis, unspecified trigger   [facility] [place], [place] P, DO   2 months ago Nasal congestion   [facility] [place], [place] P, DO   4 months ago Benign hypertension   [facility] [place], Megan P, DO   7 months ago Rash and nonspecific skin eruption   [facility] [place], Megan P, DO   9 months ago Seasonal allergic rhinitis, unspecified trigger   [place] [facility] [place], Hyla Givens, NP       Future Appointments             In 1 month Johnson, Duwaine SQUIBB, DO  [facility], [facility]   In 2 months McGowan, Clotilda DELENA RIGGERS [facility] [place]

## 2023-10-20 ENCOUNTER — Other Ambulatory Visit: Payer: Self-pay | Admitting: Family Medicine

## 2023-10-21 NOTE — Telephone Encounter (Signed)
 Requested Prescriptions  Pending Prescriptions Disp Refills   lisinopril (ZESTRIL) 20 MG tablet [Pharmacy Med Name: Lisinopril 20 MG Oral Tablet] 90 tablet 0    Sig: Take 1 tablet by mouth once daily     Cardiovascular:  ACE Inhibitors Passed - 10/21/2023 11:57 AM      Passed - Cr in normal range and within 180 days    Creatinine  Date Value Ref Range Status  01/18/2012 1.22 0.60 - 1.30 mg/dL Final   Creatinine, Ser  Date Value Ref Range Status  04/25/2023 1.00 0.76 - 1.27 mg/dL Final         Passed - K in normal range and within 180 days    Potassium  Date Value Ref Range Status  04/25/2023 4.4 3.5 - 5.2 mmol/L Final  01/18/2012 3.7 3.5 - 5.1 mmol/L Final         Passed - Patient is not pregnant      Passed - Last BP in normal range    BP Readings from Last 1 Encounters:  06/22/23 131/88         Passed - Valid encounter within last 6 months    Recent Outpatient Visits           4 months ago Allergic contact dermatitis, unspecified trigger   [place] [place], Megan P, DO   4 months ago Nasal congestion   [facility] [place], Megan P, DO   5 months ago Benign hypertension   [place] [facility] [place], Megan P, DO   9 months ago Rash and nonspecific skin eruption   [facility] [place], Megan P, DO   10 months ago Seasonal allergic rhinitis, unspecified trigger   [place] [facility] [place], Sherran Needs, NP       Future Appointments             In 3 days Laural Benes, Oralia Rud, DO [facility]   In 2 weeks McGowan, Elana Alm [facility] [place]

## 2023-10-24 ENCOUNTER — Ambulatory Visit (INDEPENDENT_AMBULATORY_CARE_PROVIDER_SITE_OTHER): Payer: PPO | Admitting: Family Medicine

## 2023-10-24 ENCOUNTER — Encounter: Payer: Self-pay | Admitting: Family Medicine

## 2023-10-24 VITALS — BP 125/82 | HR 66 | Temp 98.7°F | Resp 16 | Ht 67.99 in | Wt 268.8 lb

## 2023-10-24 DIAGNOSIS — N401 Enlarged prostate with lower urinary tract symptoms: Secondary | ICD-10-CM

## 2023-10-24 DIAGNOSIS — K219 Gastro-esophageal reflux disease without esophagitis: Secondary | ICD-10-CM

## 2023-10-24 DIAGNOSIS — L739 Follicular disorder, unspecified: Secondary | ICD-10-CM

## 2023-10-24 DIAGNOSIS — E559 Vitamin D deficiency, unspecified: Secondary | ICD-10-CM | POA: Diagnosis not present

## 2023-10-24 DIAGNOSIS — Z0000 Encounter for general adult medical examination without abnormal findings: Secondary | ICD-10-CM | POA: Diagnosis not present

## 2023-10-24 DIAGNOSIS — R338 Other retention of urine: Secondary | ICD-10-CM

## 2023-10-24 DIAGNOSIS — K760 Fatty (change of) liver, not elsewhere classified: Secondary | ICD-10-CM

## 2023-10-24 DIAGNOSIS — E782 Mixed hyperlipidemia: Secondary | ICD-10-CM | POA: Diagnosis not present

## 2023-10-24 DIAGNOSIS — I10 Essential (primary) hypertension: Secondary | ICD-10-CM

## 2023-10-24 MED ORDER — LUBIPROSTONE 8 MCG PO CAPS: 8.0000 ug | ORAL_CAPSULE | Freq: Two times a day (BID) | ORAL | 1 refills | Status: DC

## 2023-10-24 MED ORDER — LISINOPRIL 20 MG PO TABS: 20.0000 mg | ORAL_TABLET | Freq: Every day | ORAL | 1 refills | Status: DC

## 2023-10-24 MED ORDER — CETIRIZINE HCL 10 MG PO TABS: 10.0000 mg | ORAL_TABLET | Freq: Every day | ORAL | 3 refills | Status: AC

## 2023-10-24 MED ORDER — DICLOFENAC SODIUM 1 % EX GEL: 4.0000 g | Freq: Four times a day (QID) | CUTANEOUS | 12 refills | Status: AC

## 2023-10-24 MED ORDER — ACYCLOVIR 400 MG PO TABS: 400.0000 mg | ORAL_TABLET | Freq: Two times a day (BID) | ORAL | 1 refills | Status: DC

## 2023-10-24 MED ORDER — FLUTICASONE PROPIONATE 50 MCG/ACT NA SUSP: 2.0000 | Freq: Every day | NASAL | 12 refills | Status: AC

## 2023-10-24 MED ORDER — AMOXICILLIN 500 MG PO CAPS: 2000.0000 mg | ORAL_CAPSULE | Freq: Once | ORAL | 3 refills | Status: AC

## 2023-10-24 MED ORDER — SERTRALINE HCL 100 MG PO TABS: 100.0000 mg | ORAL_TABLET | Freq: Every day | ORAL | 1 refills | Status: DC

## 2023-10-24 MED ORDER — OMEPRAZOLE 40 MG PO CPDR: 40.0000 mg | DELAYED_RELEASE_CAPSULE | Freq: Every day | ORAL | 1 refills | Status: DC

## 2023-10-24 MED ORDER — BREZTRI AEROSPHERE 160-9-4.8 MCG/ACT IN AERO
2.0000 | INHALATION_SPRAY | Freq: Two times a day (BID) | RESPIRATORY_TRACT | 11 refills | Status: AC
Start: 2023-10-24 — End: ?

## 2023-10-24 MED ORDER — FAMOTIDINE 20 MG PO TABS: 20.0000 mg | ORAL_TABLET | Freq: Two times a day (BID) | ORAL | 1 refills | Status: DC

## 2023-10-24 MED ORDER — DOXYCYCLINE HYCLATE 100 MG PO TABS: 100.0000 mg | ORAL_TABLET | Freq: Two times a day (BID) | ORAL | 0 refills | Status: DC

## 2023-10-24 NOTE — Assessment & Plan Note (Signed)
 Congratulated patient on weight loss. Continue diet and exercise. Rechecking labs today. Treat as needed.

## 2023-10-24 NOTE — Assessment & Plan Note (Signed)
 Under good control on current regimen. Continue current regimen. Continue to monitor. Call with any concerns. Refills given. Labs drawn today.

## 2023-10-24 NOTE — Assessment & Plan Note (Signed)
 Congratulated patient on 10lb weight loss in the last year. Continue diet and exercise. Continue to monitor. Call with any concerns.

## 2023-10-24 NOTE — Assessment & Plan Note (Signed)
 Rechecking labs today. Await results. Treat as needed.

## 2023-10-24 NOTE — Progress Notes (Signed)
 BP 125/82 (BP Location: Left Arm, Patient Position: Sitting, Cuff Size: Large)   Pulse 66   Temp 98.7 F (37.1 C) (Oral)   Resp 16   Ht 5' 7.99" (1.727 m)   Wt 268 lb 12.8 oz (121.9 kg)   SpO2 99%   BMI 40.88 kg/m    Subjective:    Patient ID: Derrick Lunch., male    DOB: 11/05/1955, 68 y.o.   MRN: [id number]  HPI: Derrick Weimer. is a 68 y.o. male presenting on 10/24/2023 for comprehensive medical examination. Current medical complaints include:  HYPERTENSION / HYPERLIPIDEMIA Satisfied with current treatment? yes Duration of hypertension: chronic BP monitoring frequency: rarely BP medication side effects: no Past BP meds: lisinopril Duration of hyperlipidemia: chronic Cholesterol medication side effects: no Cholesterol supplements: none Past cholesterol medications: none Medication compliance: good compliance Aspirin: yes Recent stressors: no Recurrent headaches: no Visual changes: no Palpitations: no Dyspnea: no Chest pain: no Lower extremity edema: no Dizzy/lightheaded: no  BPH BPH status: controlled Satisfied with current treatment?: yes Medication side effects: no Medication compliance: excellent compliance Duration: chronic Nocturia: 1-2x per night Urinary frequency:no Incomplete voiding: no Urgency: no Weak urinary stream: no Straining to start stream: no Dysuria: no Onset: gradual Severity: moderate  DEPRESSION Mood status: controlled Satisfied with current treatment?: yes Symptom severity: mild  Duration of current treatment : chronic Side effects: no Medication compliance: excellent compliance Psychotherapy/counseling: no  Previous psychiatric medications: sertraline Depressed mood: no Anxious mood: no Anhedonia: no Significant weight loss or gain: no Insomnia: no  Fatigue: no Feelings of worthlessness or guilt: no Impaired concentration/indecisiveness: no Suicidal ideations: no Hopelessness: no Crying spells: no     10/24/2023    8:48 AM 06/23/2023   10:32 AM 06/22/2023    8:56 AM 06/06/2023    3:32 PM 04/25/2023   10:49 AM  Depression screen PHQ 2/9  Decreased Interest 0 1 3 0 0  Down, Depressed, Hopeless 0 1 0 0 0  PHQ - 2 Score 0 2 3 0 0  Altered sleeping  0 0 0 3  Tired, decreased energy  0 2 1 0  Change in appetite  0 0 0 0  Feeling bad or failure about yourself   0 0 0 0  Trouble concentrating  0 0 0 3  Moving slowly or fidgety/restless  0 0 0 0  Suicidal thoughts  0 0 0 0  PHQ-9 Score  2 5 1 6   Difficult doing work/chores  Not difficult at all   Not difficult at all    He currently lives with: alone Interim Problems from his last visit: no  Depression Screen done today and results listed below:     10/24/2023    8:48 AM 06/23/2023   10:32 AM 06/22/2023    8:56 AM 06/06/2023    3:32 PM 04/25/2023   10:49 AM  Depression screen PHQ 2/9  Decreased Interest 0 1 3 0 0  Down, Depressed, Hopeless 0 1 0 0 0  PHQ - 2 Score 0 2 3 0 0  Altered sleeping  0 0 0 3  Tired, decreased energy  0 2 1 0  Change in appetite  0 0 0 0  Feeling bad or failure about yourself   0 0 0 0  Trouble concentrating  0 0 0 3  Moving slowly or fidgety/restless  0 0 0 0  Suicidal thoughts  0 0 0 0  PHQ-9 Score  2 5 1  6  Difficult doing work/chores  Not difficult at all   Not difficult at all    Past Medical History:  Past Medical History:  Diagnosis Date   Allergy    Arthritis    Cervical radiculopathy 01/16/2021   Cough    nonproductive no fever saw at dr 05-26-18   GERD (gastroesophageal reflux disease)    Hyperlipidemia    Hypertension    Meningitis due to viruses 1968    Surgical History:  Past Surgical History:  Procedure Laterality Date   COLONOSCOPY WITH PROPOFOL N/A 05/12/2018   Procedure: COLONOSCOPY WITH PROPOFOL;  Surgeon: Wyline Mood, MD;  Location: [facility] ENDOSCOPY;  Service: Gastroenterology;  Laterality: N/A;   ESOPHAGOGASTRODUODENOSCOPY (EGD) WITH PROPOFOL N/A 05/12/2018   Procedure:  ESOPHAGOGASTRODUODENOSCOPY (EGD) WITH PROPOFOL;  Surgeon: Wyline Mood, MD;  Location: [facility] ENDOSCOPY;  Service: Gastroenterology;  Laterality: N/A;   ESOPHAGOGASTRODUODENOSCOPY (EGD) WITH PROPOFOL N/A 06/01/2018   Procedure: ESOPHAGOGASTRODUODENOSCOPY (EGD) WITH PROPOFOL;  Surgeon: Rachael Fee, MD;  Location: [facility] ENDOSCOPY;  Service: Endoscopy;  Laterality: N/A;   EUS N/A 06/01/2018   Procedure: UPPER ENDOSCOPIC ULTRASOUND (EUS) RADIAL;  Surgeon: Rachael Fee, MD;  Location: [facility] ENDOSCOPY;  Service: Endoscopy;  Laterality: N/A;   JOINT REPLACEMENT Bilateral    NASAL SINUS SURGERY     TOTAL KNEE ARTHROPLASTY      Medications:  Current Outpatient Medications on File Prior to Visit  Medication Sig   albuterol (VENTOLIN HFA) 108 (90 Base) MCG/ACT inhaler Inhale 1-2 puffs into the lungs every 6 (six) hours as needed for wheezing or shortness of breath.   Ascorbic Acid (VITAMIN C) 1000 MG tablet Take 1,000 mg by mouth at bedtime.    aspirin EC 81 MG tablet Take 81 mg by mouth daily.   azelastine (ASTELIN) 0.1 % nasal spray SMARTSIG:1-2 Spray(s) Both Nares Twice Daily   cholecalciferol (VITAMIN D) 1000 UNITS tablet Take 1,000 Units by mouth daily.    cyclobenzaprine (FLEXERIL) 10 MG tablet Take 1 tablet (10 mg total) by mouth 3 (three) times daily as needed. for muscle spams   guaiFENesin (MUCINEX) 600 MG 12 hr tablet Take 1 tablet (600 mg total) by mouth 2 (two) times daily.   methocarbamol (ROBAXIN) 500 MG tablet Take 500 mg by mouth daily as needed.   montelukast (SINGULAIR) 10 MG tablet Take 1 tablet (10 mg total) by mouth at bedtime.   Multiple Vitamins-Minerals (MULTIVITAMIN WITH MINERALS) tablet Take 1 tablet by mouth daily.   mupirocin ointment (BACTROBAN) 2 % Apply 1 Application topically 2 (two) times daily.   naproxen sodium (ALEVE) 220 MG tablet Take 220 mg by mouth daily as needed.   nystatin cream (MYCOSTATIN) Apply 1 Application topically 2 (two) times daily.   tamsulosin  (FLOMAX) 0.4 MG CAPS capsule Take 1 capsule (0.4 mg total) by mouth daily.   triamcinolone cream (KENALOG) 0.5 % Apply 1 Application topically 3 (three) times daily.   triamcinolone ointment (KENALOG) 0.1 % Apply 1 application topically 2 (two) times daily. (Patient not taking: Reported on 10/24/2023)   No current facility-administered medications on file prior to visit.    Allergies:  No Known Allergies  Social History:  Social History   Socioeconomic History   Marital status: Widowed    Spouse name: Not on file   Number of children: 1   Years of education: 10th grade, GED   Highest education level: GED or equivalent  Occupational History   Occupation: disabled   Tobacco Use   Smoking status: Never  Passive exposure: Never   Smokeless tobacco: Never  Vaping Use   Vaping status: Never Used  Substance and Sexual Activity   Alcohol use: Not Currently    Alcohol/week: 0.0 standard drinks of alcohol    Comment: occasionally, twice a year    Drug use: No   Sexual activity: Not Currently  Other Topics Concern   Not on file  Social History Narrative   Not on file   Social Drivers of Health   Financial Resource Strain: Low Risk  (10/20/2023)   Overall Financial Resource Strain (CARDIA)    Difficulty of Paying Living Expenses: Not hard at all  Food Insecurity: No Food Insecurity (10/20/2023)   Hunger Vital Sign    Worried About Running Out of Food in the Last Year: Never true    Ran Out of Food in the Last Year: Never true  Transportation Needs: No Transportation Needs (10/20/2023)   PRAPARE - Administrator, Civil Service (Medical): No    Lack of Transportation (Non-Medical): No  Physical Activity: Insufficiently Active (10/20/2023)   Exercise Vital Sign    Days of Exercise per Week: 3 days    Minutes of Exercise per Session: 30 min  Stress: No Stress Concern Present (10/20/2023)   [employer] of Occupational Health - Occupational Stress Questionnaire     Feeling of Stress : Not at all  Social Connections: Socially Isolated (10/20/2023)   Social Connection and Isolation Panel [NHANES]    Frequency of Communication with Friends and Family: Once a week    Frequency of Social Gatherings with Friends and Family: Never    Attends Religious Services: Never    Database administrator or Organizations: No    Attends Banker Meetings: Never    Marital Status: Widowed  Intimate Partner Violence: Not At Risk (06/23/2023)   Humiliation, Afraid, Rape, and Kick questionnaire    Fear of Current or Ex-Partner: No    Emotionally Abused: No    Physically Abused: No    Sexually Abused: No   Social History   Tobacco Use  Smoking Status Never   Passive exposure: Never  Smokeless Tobacco Never   Social History   Substance and Sexual Activity  Alcohol Use Not Currently   Alcohol/week: 0.0 standard drinks of alcohol   Comment: occasionally, twice a year     Family History:  Family History  Problem Relation Age of Onset   Breast cancer Mother    Cancer Father        unsure, passed away before it was confirmed   Pancreatic cancer Daughter    Diabetes Neg Hx    Heart disease Neg Hx    Hypertension Neg Hx    Stroke Neg Hx    COPD Neg Hx    Colon cancer Neg Hx     Past medical history, surgical history, medications, allergies, family history and social history reviewed with patient today and changes made to appropriate areas of the chart.   Review of Systems  Constitutional: Negative.   HENT:  Positive for congestion and ear pain. Negative for ear discharge, hearing loss, nosebleeds, sinus pain, sore throat and tinnitus.   Eyes: Negative.   Respiratory:  Positive for cough. Negative for hemoptysis, sputum production, shortness of breath, wheezing and stridor.   Cardiovascular: Negative.   Gastrointestinal: Negative.   Genitourinary: Negative.   Musculoskeletal: Negative.   Skin: Negative.   Neurological: Negative.    Endo/Heme/Allergies:  Positive for environmental allergies. Negative  for polydipsia. Does not bruise/bleed easily.  Psychiatric/Behavioral: Negative.     All other ROS negative except what is listed above and in the HPI.      Objective:    BP 125/82 (BP Location: Left Arm, Patient Position: Sitting, Cuff Size: Large)   Pulse 66   Temp 98.7 F (37.1 C) (Oral)   Resp 16   Ht 5' 7.99" (1.727 m)   Wt 268 lb 12.8 oz (121.9 kg)   SpO2 99%   BMI 40.88 kg/m   Wt Readings from Last 3 Encounters:  10/24/23 268 lb 12.8 oz (121.9 kg)  06/23/23 270 lb (122.5 kg)  06/22/23 274 lb 9.6 oz (124.6 kg)    Physical Exam Vitals and nursing note reviewed.  Constitutional:      General: He is not in acute distress.    Appearance: Normal appearance. He is obese. He is not ill-appearing, toxic-appearing or diaphoretic.  HENT:     Head: Normocephalic and atraumatic.     Right Ear: Tympanic membrane, ear canal and external ear normal. There is no impacted cerumen.     Left Ear: Tympanic membrane, ear canal and external ear normal. There is no impacted cerumen.     Nose: Nose normal. No congestion or rhinorrhea.     Mouth/Throat:     Mouth: Mucous membranes are moist.     Pharynx: Oropharynx is clear. No oropharyngeal exudate or posterior oropharyngeal erythema.  Eyes:     General: No scleral icterus.       Right eye: No discharge.        Left eye: No discharge.     Extraocular Movements: Extraocular movements intact.     Conjunctiva/sclera: Conjunctivae normal.     Pupils: Pupils are equal, round, and reactive to light.  Neck:     Vascular: No carotid bruit.  Cardiovascular:     Rate and Rhythm: Normal rate and regular rhythm.     Pulses: Normal pulses.     Heart sounds: No murmur heard.    No friction rub. No gallop.  Pulmonary:     Effort: Pulmonary effort is normal. No respiratory distress.     Breath sounds: Normal breath sounds. No stridor. No wheezing, rhonchi or rales.  Chest:      Chest wall: No tenderness.  Abdominal:     General: Abdomen is flat. Bowel sounds are normal. There is no distension.     Palpations: Abdomen is soft. There is no mass.     Tenderness: There is no abdominal tenderness. There is no right CVA tenderness, left CVA tenderness, guarding or rebound.     Hernia: No hernia is present.  Genitourinary:    Comments: Genital exam deferred with shared decision making Musculoskeletal:        General: No swelling, tenderness, deformity or signs of injury.     Cervical back: Normal range of motion and neck supple. No rigidity. No muscular tenderness.     Right lower leg: No edema.     Left lower leg: No edema.  Lymphadenopathy:     Cervical: No cervical adenopathy.  Skin:    General: Skin is warm and dry.     Capillary Refill: Capillary refill takes less than 2 seconds.     Coloration: Skin is not jaundiced or pale.     Findings: No bruising, erythema, lesion or rash.  Neurological:     General: No focal deficit present.     Mental Status: He is alert and oriented  to person, place, and time.     Cranial Nerves: No cranial nerve deficit.     Sensory: No sensory deficit.     Motor: No weakness.     Coordination: Coordination normal.     Gait: Gait normal.     Deep Tendon Reflexes: Reflexes normal.  Psychiatric:        Mood and Affect: Mood normal.        Behavior: Behavior normal.        Thought Content: Thought content normal.        Judgment: Judgment normal.     Results for orders placed or performed in visit on 04/25/23  CBC with Differential/Platelet   Collection Time: 04/25/23 10:55 AM  Result Value Ref Range   WBC 6.5 3.4 - 10.8 x10E3/uL   RBC 5.36 4.14 - 5.80 x10E6/uL   Hemoglobin 16.3 13.0 - 17.7 g/dL   Hematocrit 54.0 98.1 - 51.0 %   MCV 91 79 - 97 fL   MCH 30.4 26.6 - 33.0 pg   MCHC 33.5 31.5 - 35.7 g/dL   RDW 19.1 47.8 - 29.5 %   Platelets 171 150 - 450 x10E3/uL   Neutrophils 46 Not Estab. %   Lymphs 38 Not Estab. %    Monocytes 11 Not Estab. %   Eos 3 Not Estab. %   Basos 1 Not Estab. %   Neutrophils Absolute 3.0 1.4 - 7.0 x10E3/uL   Lymphocytes Absolute 2.5 0.7 - 3.1 x10E3/uL   Monocytes Absolute 0.7 0.1 - 0.9 x10E3/uL   EOS (ABSOLUTE) 0.2 0.0 - 0.4 x10E3/uL   Basophils Absolute 0.1 0.0 - 0.2 x10E3/uL   Immature Granulocytes 1 Not Estab. %   Immature Grans (Abs) 0.0 0.0 - 0.1 x10E3/uL  Comprehensive metabolic panel   Collection Time: 04/25/23 10:55 AM  Result Value Ref Range   Glucose 113 (H) 70 - 99 mg/dL   BUN 14 8 - 27 mg/dL   Creatinine, Ser 6.21 0.76 - 1.27 mg/dL   eGFR 84 >30 QM/VHQ/4.69   BUN/Creatinine Ratio 14 10 - 24   Sodium 140 134 - 144 mmol/L   Potassium 4.4 3.5 - 5.2 mmol/L   Chloride 104 96 - 106 mmol/L   CO2 20 20 - 29 mmol/L   Calcium 9.6 8.6 - 10.2 mg/dL   Total Protein 7.2 6.0 - 8.5 g/dL   Albumin 4.5 3.9 - 4.9 g/dL   Globulin, Total 2.7 1.5 - 4.5 g/dL   Bilirubin Total 0.5 0.0 - 1.2 mg/dL   Alkaline Phosphatase 68 44 - 121 IU/L   AST 55 (H) 0 - 40 IU/L   ALT 60 (H) 0 - 44 IU/L  Lipid Panel w/o Chol/HDL Ratio   Collection Time: 04/25/23 10:55 AM  Result Value Ref Range   Cholesterol, Total 170 100 - 199 mg/dL   Triglycerides 629 0 - 149 mg/dL   HDL 39 (L) >52 mg/dL   VLDL Cholesterol Cal 25 5 - 40 mg/dL   LDL Chol Calc (NIH) 841 (H) 0 - 99 mg/dL  VITAMIN D 25 Hydroxy (Vit-D Deficiency, Fractures)   Collection Time: 04/25/23 10:55 AM  Result Value Ref Range   Vit D, 25-Hydroxy 38.6 30.0 - 100.0 ng/mL  PSA   Collection Time: 04/25/23 10:55 AM  Result Value Ref Range   Prostate Specific Ag, Serum 0.2 0.0 - 4.0 ng/mL      Assessment & Plan:   Problem List Items Addressed This Visit       Cardiovascular  and Mediastinum   Benign hypertension   Under good control on current regimen. Continue current regimen. Continue to monitor. Call with any concerns. Refills given. Labs drawn today.       Relevant Medications   lisinopril (ZESTRIL) 20 MG tablet   Other  Relevant Orders   Comprehensive metabolic panel   CBC with Differential/Platelet   TSH   Urine Microalbumin w/creat. ratio     Digestive   Fatty liver   Congratulated patient on weight loss. Continue diet and exercise. Rechecking labs today. Treat as needed.       Relevant Orders   Comprehensive metabolic panel   CBC with Differential/Platelet   Hgb A1c w/o eAG   GERD (gastroesophageal reflux disease)   Under good control on current regimen. Continue current regimen. Continue to monitor. Call with any concerns. Refills given. Labs drawn today.      Relevant Medications   omeprazole (PRILOSEC) 40 MG capsule   lubiprostone (AMITIZA) 8 MCG capsule   famotidine (PEPCID) 20 MG tablet   Other Relevant Orders   Comprehensive metabolic panel   CBC with Differential/Platelet     Genitourinary   Benign prostatic hyperplasia with lower urinary tract symptoms   Under good control on current regimen. Continue current regimen. Continue to monitor. Call with any concerns. Refills given. Labs drawn today.      Relevant Orders   Comprehensive metabolic panel   CBC with Differential/Platelet   PSA     Other   Morbid obesity (HCC)   Congratulated patient on 10lb weight loss in the last year. Continue diet and exercise. Continue to monitor. Call with any concerns.      Hyperlipidemia   Rechecking labs today. Treat as needed.       Relevant Medications   lisinopril (ZESTRIL) 20 MG tablet   Other Relevant Orders   Comprehensive metabolic panel   CBC with Differential/Platelet   Lipid Panel w/o Chol/HDL Ratio   Vitamin D deficiency   Rechecking labs today. Await results. Treat as needed.       Relevant Orders   Comprehensive metabolic panel   CBC with Differential/Platelet   VITAMIN D 25 Hydroxy (Vit-D Deficiency, Fractures)   Other Visit Diagnoses       Routine general medical examination at a health care facility    -  Primary   Vaccines up to date. Screening labs checked  today. Colonoscopy up to date. Continue diet and exercise. Call with any concerns. Continue to monitor.     Folliculitis       Will treat with doxycycline. Call with any concerns. Continue to monitor.        LABORATORY TESTING:  Health maintenance labs ordered today as discussed above.   The natural history of prostate cancer and ongoing controversy regarding screening and potential treatment outcomes of prostate cancer has been discussed with the patient. The meaning of a false positive PSA and a false negative PSA has been discussed. He indicates understanding of the limitations of this screening test and wishes to proceed with screening PSA testing.   IMMUNIZATIONS:   - Tdap: Tetanus vaccination status reviewed: last tetanus booster within 10 years. - Influenza: Up to date - Pneumovax: Up to date - Prevnar: Up to date - COVID: Not applicable - HPV: Not applicable - Shingrix vaccine: Up to date  SCREENING: - Colonoscopy: Up to date  Discussed with patient purpose of the colonoscopy is to detect colon cancer at curable precancerous or early stages  PATIENT COUNSELING:    Sexuality: Discussed sexually transmitted diseases, partner selection, use of condoms, avoidance of unintended pregnancy  and contraceptive alternatives.   Advised to avoid cigarette smoking.  I discussed with the patient that most people either abstain from alcohol or drink within safe limits (<=14/week and <=4 drinks/occasion for males, <=7/weeks and <= 3 drinks/occasion for females) and that the risk for alcohol disorders and other health effects rises proportionally with the number of drinks per week and how often a drinker exceeds daily limits.  Discussed cessation/primary prevention of drug use and availability of treatment for abuse.   Diet: Encouraged to adjust caloric intake to maintain  or achieve ideal body weight, to reduce intake of dietary saturated fat and total fat, to limit sodium intake by  avoiding high sodium foods and not adding table salt, and to maintain adequate dietary potassium and calcium preferably from fresh fruits, vegetables, and low-fat dairy products.    stressed the importance of regular exercise  Injury prevention: Discussed safety belts, safety helmets, smoke detector, smoking near bedding or upholstery.   Dental health: Discussed importance of regular tooth brushing, flossing, and dental visits.   Follow up plan: NEXT PREVENTATIVE PHYSICAL DUE IN 1 YEAR. Return in about 6 months (around 04/25/2024).

## 2023-10-24 NOTE — Assessment & Plan Note (Signed)
Rechecking labs today. Treat as needed. 

## 2023-10-25 ENCOUNTER — Encounter: Payer: Self-pay | Admitting: Family Medicine

## 2023-10-25 LAB — CBC WITH DIFFERENTIAL/PLATELET
Basophils Absolute: 0 10*3/uL (ref 0.0–0.2)
Basos: 1 %
EOS (ABSOLUTE): 0.2 10*3/uL (ref 0.0–0.4)
Eos: 3 %
Hematocrit: 46.6 % (ref 37.5–51.0)
Hemoglobin: 15.9 g/dL (ref 13.0–17.7)
Immature Grans (Abs): 0 10*3/uL (ref 0.0–0.1)
Immature Granulocytes: 0 %
Lymphocytes Absolute: 2.5 10*3/uL (ref 0.7–3.1)
Lymphs: 38 %
MCH: 31.9 pg (ref 26.6–33.0)
MCHC: 34.1 g/dL (ref 31.5–35.7)
MCV: 93 fL (ref 79–97)
Monocytes Absolute: 0.6 10*3/uL (ref 0.1–0.9)
Monocytes: 10 %
Neutrophils Absolute: 3.1 10*3/uL (ref 1.4–7.0)
Neutrophils: 48 %
Platelets: 210 10*3/uL (ref 150–450)
RBC: 4.99 x10E6/uL (ref 4.14–5.80)
RDW: 12.8 % (ref 11.6–15.4)
WBC: 6.4 10*3/uL (ref 3.4–10.8)

## 2023-10-25 LAB — COMPREHENSIVE METABOLIC PANEL
ALT: 49 IU/L — ABNORMAL HIGH (ref 0–44)
AST: 44 IU/L — ABNORMAL HIGH (ref 0–40)
Albumin: 4.5 g/dL (ref 3.9–4.9)
Alkaline Phosphatase: 67 IU/L (ref 44–121)
BUN/Creatinine Ratio: 22 (ref 10–24)
BUN: 19 mg/dL (ref 8–27)
Bilirubin Total: 0.6 mg/dL (ref 0.0–1.2)
CO2: 21 mmol/L (ref 20–29)
Calcium: 9.4 mg/dL (ref 8.6–10.2)
Chloride: 109 mmol/L — ABNORMAL HIGH (ref 96–106)
Creatinine, Ser: 0.86 mg/dL (ref 0.76–1.27)
Globulin, Total: 2.3 g/dL (ref 1.5–4.5)
Glucose: 137 mg/dL — ABNORMAL HIGH (ref 70–99)
Potassium: 4.1 mmol/L (ref 3.5–5.2)
Sodium: 142 mmol/L (ref 134–144)
Total Protein: 6.8 g/dL (ref 6.0–8.5)
eGFR: 95 mL/min/{1.73_m2} (ref >59)

## 2023-10-25 LAB — LIPID PANEL W/O CHOL/HDL RATIO
Cholesterol, Total: 154 mg/dL (ref 100–199)
HDL: 37 mg/dL — ABNORMAL LOW (ref >39)
LDL Chol Calc (NIH): 98 mg/dL (ref 0–99)
Triglycerides: 101 mg/dL (ref 0–149)
VLDL Cholesterol Cal: 19 mg/dL (ref 5–40)

## 2023-10-25 LAB — MICROALBUMIN / CREATININE URINE RATIO
Creatinine, Urine: 100.4 mg/dL
Microalb/Creat Ratio: 30 mg/g{creat} — ABNORMAL HIGH (ref 0–29)
Microalbumin, Urine: 30.6 ug/mL

## 2023-10-25 LAB — PSA: Prostate Specific Ag, Serum: 0.2 ng/mL (ref 0.0–4.0)

## 2023-10-25 LAB — HGB A1C W/O EAG: Hgb A1c MFr Bld: 6 % — ABNORMAL HIGH (ref 4.8–5.6)

## 2023-10-25 LAB — VITAMIN D 25 HYDROXY (VIT D DEFICIENCY, FRACTURES): Vit D, 25-Hydroxy: 36.4 ng/mL (ref 30.0–100.0)

## 2023-10-25 LAB — TSH: TSH: 1.4 u[IU]/mL (ref 0.450–4.500)

## 2023-10-26 ENCOUNTER — Encounter: Payer: Self-pay | Admitting: Family Medicine

## 2023-10-28 DIAGNOSIS — R339 Retention of urine, unspecified: Secondary | ICD-10-CM | POA: Diagnosis not present

## 2023-11-04 NOTE — Progress Notes (Unsigned)
 11/07/23 10:25 PM   [name]. 02-May-1956 [id number]  Referring provider:  Dorcas Carrow, DO [address] [place],  [place] [address]  Urological history: 1. Incomplete bladder emptying -TUIP w/ Dr. Achilles Dunk (2015) -UDS (2019) incomplete bladder emptying with wide fill of porstatic urethra  -recommended to have an Interstim placed by Dr. Daine Gip, deferred -currently cathing four times daily   2. BPH with LU TS -PSA (10/2023) 0.2 -cysto (2019) - large TURP defect noted.  Wide open prostate fossa.  -cysto (2022) - small amount of prostatic anterior tissue  -tamsulosin 0.4 mg daily and CIC  3. Orchitis and epididymitis -had episode in 2021   4.  Asymptomatic bacteriuria - Likely colonized due to self cathing   Benign Prostatic Hypertrophy   HPI: Derrick Sandoval. is a 68 y.o.male who presents today for yearly visit.    Previous records reviewed.     I PSS 7/6  PVR 43 mL  UA yellow cloudy, specific gravity greater than 1.030, pH 5.5, moderate heme, trace ketone, 100 protein, nitrite positive, small leukocytes, 21-50 WBCs, greater than 50 RBCs, many bacteria, mucus present and calcium oxalate present  He is not happy with his urinary condition because he has to catheterize himself.  He is catheterizing 3 times daily.  He does still void spontaneously, but has to strain to do so.  He is taking tamsulosin 0.4 mg daily.  He cannot have the InterStim placed with Dr. Nino Parsley because he does not have any transportation to [facility].  KUB No stones seen.    IPSS     Row Name 11/07/23 0900         International Prostate Symptom Score   How often have you had the sensation of not emptying your bladder? Not at All     How often have you had to urinate less than every two hours? Not at All     How often have you found you stopped and started again several times when you urinated? Not at All     How often have you found it difficult to postpone urination? Not at All      How often have you had a weak urinary stream? Not at All     How often have you had to strain to start urination? Almost always     How many times did you typically get up at night to urinate? 2 Times     Total IPSS Score 7       Quality of Life due to urinary symptoms   If you were to spend the rest of your life with your urinary condition just the way it is now how would you feel about that? Terrible              Score:  1-7 Mild 8-19 Moderate 20-35 Severe    PMH: Past Medical History:  Diagnosis Date   Allergy    Arthritis    Cervical radiculopathy 01/16/2021   Cough    nonproductive no fever saw at dr 05-26-18   GERD (gastroesophageal reflux disease)    Hyperlipidemia    Hypertension    Meningitis due to viruses 1968    Surgical History: Past Surgical History:  Procedure Laterality Date   COLONOSCOPY WITH PROPOFOL N/A 05/12/2018   Procedure: COLONOSCOPY WITH PROPOFOL;  Surgeon: Wyline Mood, MD;  Location: [facility] ENDOSCOPY;  Service: Gastroenterology;  Laterality: N/A;   ESOPHAGOGASTRODUODENOSCOPY (EGD) WITH PROPOFOL N/A 05/12/2018   Procedure:  ESOPHAGOGASTRODUODENOSCOPY (EGD) WITH PROPOFOL;  Surgeon: Wyline Mood, MD;  Location: [facility] ENDOSCOPY;  Service: Gastroenterology;  Laterality: N/A;   ESOPHAGOGASTRODUODENOSCOPY (EGD) WITH PROPOFOL N/A 06/01/2018   Procedure: ESOPHAGOGASTRODUODENOSCOPY (EGD) WITH PROPOFOL;  Surgeon: Rachael Fee, MD;  Location: [facility] ENDOSCOPY;  Service: Endoscopy;  Laterality: N/A;   EUS N/A 06/01/2018   Procedure: UPPER ENDOSCOPIC ULTRASOUND (EUS) RADIAL;  Surgeon: Rachael Fee, MD;  Location: [facility] ENDOSCOPY;  Service: Endoscopy;  Laterality: N/A;   JOINT REPLACEMENT Bilateral    NASAL SINUS SURGERY     TOTAL KNEE ARTHROPLASTY      Home Medications:  Allergies as of 11/07/2023   No Known Allergies      Medication List        Accurate as of November 07, 2023 10:25 PM. If you have any questions, ask your nurse or doctor.           acyclovir 400 MG tablet Commonly known as: ZOVIRAX Take 1 tablet (400 mg total) by mouth 2 (two) times daily.   albuterol 108 (90 Base) MCG/ACT inhaler Commonly known as: VENTOLIN HFA Inhale 1-2 puffs into the lungs every 6 (six) hours as needed for wheezing or shortness of breath.   amoxicillin 500 MG capsule Commonly known as: AMOXIL Take 2,000 mg by mouth as directed.   aspirin EC 81 MG tablet Take 81 mg by mouth daily.   azelastine 0.1 % nasal spray Commonly known as: ASTELIN SMARTSIG:1-2 Spray(s) Both Nares Twice Daily   Breztri Aerosphere 160-9-4.8 MCG/ACT Aero Generic drug: budeson-glycopyrrolate-formoterol Inhale 2 puffs into the lungs 2 (two) times daily.   cetirizine 10 MG tablet Commonly known as: ZYRTEC Take 1 tablet (10 mg total) by mouth daily.   cholecalciferol 1000 units tablet Commonly known as: VITAMIN D Take 1,000 Units by mouth daily.   cyclobenzaprine 10 MG tablet Commonly known as: FLEXERIL Take 1 tablet (10 mg total) by mouth 3 (three) times daily as needed. for muscle spams   diclofenac Sodium 1 % Gel Commonly known as: Voltaren Apply 4 g topically 4 (four) times daily.   doxycycline 100 MG tablet Commonly known as: VIBRA-TABS Take 1 tablet (100 mg total) by mouth 2 (two) times daily.   famotidine 20 MG tablet Commonly known as: PEPCID Take 1 tablet (20 mg total) by mouth 2 (two) times daily.   fluticasone 50 MCG/ACT nasal spray Commonly known as: FLONASE Place 2 sprays into both nostrils daily.   guaiFENesin 600 MG 12 hr tablet Commonly known as: Mucinex Take 1 tablet (600 mg total) by mouth 2 (two) times daily.   lisinopril 20 MG tablet Commonly known as: ZESTRIL Take 1 tablet (20 mg total) by mouth daily.   lubiprostone 8 MCG capsule Commonly known as: AMITIZA Take 1 capsule (8 mcg total) by mouth 2 (two) times daily with a meal.   methocarbamol 500 MG tablet Commonly known as: ROBAXIN Take 500 mg by mouth daily as  needed.   montelukast 10 MG tablet Commonly known as: SINGULAIR Take 1 tablet (10 mg total) by mouth at bedtime.   multivitamin with minerals tablet Take 1 tablet by mouth daily.   mupirocin ointment 2 % Commonly known as: BACTROBAN Apply 1 Application topically 2 (two) times daily.   naproxen sodium 220 MG tablet Commonly known as: ALEVE Take 220 mg by mouth daily as needed.   nystatin cream Commonly known as: MYCOSTATIN Apply 1 Application topically 2 (two) times daily.   omeprazole 40 MG capsule Commonly known as: PRILOSEC  Take 1 capsule (40 mg total) by mouth daily.   sertraline 100 MG tablet Commonly known as: ZOLOFT Take 1 tablet (100 mg total) by mouth daily.   tamsulosin 0.4 MG Caps capsule Commonly known as: FLOMAX Take 1 capsule (0.4 mg total) by mouth daily.   triamcinolone ointment 0.1 % Commonly known as: KENALOG Apply 1 application  topically 2 (two) times daily.   triamcinolone cream 0.5 % Commonly known as: KENALOG Apply 1 Application topically 3 (three) times daily.   vitamin C 1000 MG tablet Take 1,000 mg by mouth at bedtime.        Allergies:  No Known Allergies  Family History: Family History  Problem Relation Age of Onset   Breast cancer Mother    Cancer Father        unsure, passed away before it was confirmed   Pancreatic cancer Daughter    Diabetes Neg Hx    Heart disease Neg Hx    Hypertension Neg Hx    Stroke Neg Hx    COPD Neg Hx    Colon cancer Neg Hx     Social History:  reports that he has never smoked. He has never been exposed to tobacco smoke. He has never used smokeless tobacco. He reports that he does not currently use alcohol. He reports that he does not use drugs.   Physical Exam: BP (!) 154/91 (BP Location: Left Arm, Patient Position: Sitting, Cuff Size: Large)   Pulse 67   Ht 5' 7.9" (1.725 m)   Wt 269 lb (122 kg)   SpO2 100%   BMI 41.02 kg/m   Constitutional:  Well nourished. Alert and oriented, No  acute distress. HEENT: [place] AT, moist mucus membranes.  Trachea midline Cardiovascular: No clubbing, cyanosis, or edema. Respiratory: Normal respiratory effort, no increased work of breathing. GU: No CVA tenderness.  No bladder fullness or masses.  Patient with uncircumcised phallus. Foreskin easily retracted.  Urethral meatus is patent.  No penile discharge. No penile lesions or rashes. Scrotum without lesions, cysts, rashes and/or edema.  Testicles are located scrotally bilaterally. No masses are appreciated in the testicles. Left and right epididymis are normal. Rectal: Patient with  normal sphincter tone. Anus and perineum without scarring or rashes. No rectal masses are appreciated. Prostate is approximately 40 grams, no nodules are appreciated. Seminal vesicles could not be palpated.  Neurologic: Grossly intact, no focal deficits, moving all 4 extremities. Psychiatric: Normal mood and affect.   Laboratory Data: Component     Latest Ref Rng 10/24/2023  Hemoglobin A1C     4.8 - 5.6 % 6.0 (H)     Legend: (H) High  Component     Latest Ref Rng 10/24/2023  Cholesterol, Total     100 - 199 mg/dL 161   Triglycerides     0 - 149 mg/dL 096   HDL Cholesterol     >39 mg/dL 37 (L)   VLDL Cholesterol Cal     5 - 40 mg/dL 19   LDL Chol Calc (NIH)     0 - 99 mg/dL 98     Legend: (L) Low  Component     Latest Ref Rng 10/24/2023  WBC     3.4 - 10.8 x10E3/uL 6.4   RBC     4.14 - 5.80 x10E6/uL 4.99   Hemoglobin     13.0 - 17.7 g/dL 04.5   HCT     40.9 - 81.1 % 46.6   MCV  79 - 97 fL 93   MCH     26.6 - 33.0 pg 31.9   MCHC     31.5 - 35.7 g/dL 16.1   RDW     09.6 - 04.5 % 12.8   Platelets     150 - 450 x10E3/uL 210   Neutrophils     Not Estab. % 48   Lymphs     Not Estab. % 38   Monocytes     Not Estab. % 10   Eos     Not Estab. % 3   Basos     Not Estab. % 1   NEUT#     1.4 - 7.0 x10E3/uL 3.1   Lymphs Abs     0.7 - 3.1 x10E3/uL 2.5   Monocytes Absolute     0.1 -  0.9 x10E3/uL 0.6   EOS (ABSOLUTE)     0.0 - 0.4 x10E3/uL 0.2   Basophils Absolute     0.0 - 0.2 x10E3/uL 0.0   Immature Granulocytes     Not Estab. % 0   Immature Grans (Abs)     0.0 - 0.1 x10E3/uL 0.0     Component     Latest Ref Rng 10/24/2023  Glucose     70 - 99 mg/dL 409 (H)   BUN     8 - 27 mg/dL 19   Creatinine     8.11 - 1.27 mg/dL 9.14   BUN/Creatinine Ratio     10 - 24  22   Sodium     134 - 144 mmol/L 142   Potassium     3.5 - 5.2 mmol/L 4.1   Chloride     96 - 106 mmol/L 109 (H)   CO2     20 - 29 mmol/L 21   Calcium     8.6 - 10.2 mg/dL 9.4   Total Protein     6.0 - 8.5 g/dL 6.8   Albumin     3.9 - 4.9 g/dL 4.5   Globulin, Total     1.5 - 4.5 g/dL 2.3   Total Bilirubin     0.0 - 1.2 mg/dL 0.6   Alkaline Phosphatase     44 - 121 IU/L 67   AST     0 - 40 IU/L 44 (H)   ALT     0 - 44 IU/L 49 (H)   eGFR     >59 mL/min/1.73 95     Legend: (H) High  Urinalysis See EPIC and HPI I have reviewed the labs.   Pertinent imaging.  11/07/23 09:02  Scan Result 43ml  KUB no stones seen, radiologist interpretation still pending I have independently reviewed the films.  See HPI.    Assessment & Plan:    1. Incomplete bladder emptying -Patient caths 3 times daily, indefinitely due to stricture disease and BPH.  Patient requires coude caths because he has prostate enlargement and cannot advance straight catheter.     2. BPH with LUTS -PSA stable -PVR demonstrates adequate emptying with self catheterization -continue conservative management, avoiding bladder irritants and timed voiding's -tamsulosin 0.4 mg daily  3.  Microscopic hematuria -UA w/ micro heme -urine culture pending -KUB no stones seen -explained that we will need to send his urine for culture and if the urine is positive for infection to treat and then reassess, if hematuria persistent will need to perform a hematuria work up to ensure there are not GU cancers   Return  for pending  urine cuture results .  Michiel Cowboy, PA-C   [facility]  [facility]) [address], [address] [place], [place] [address] [phone]

## 2023-11-07 ENCOUNTER — Encounter: Payer: Self-pay | Admitting: Urology

## 2023-11-07 ENCOUNTER — Ambulatory Visit
Admission: RE | Admit: 2023-11-07 | Discharge: 2023-11-07 | Disposition: A | Discharge status: Home / Self Care | Attending: Urology | Admitting: Urology

## 2023-11-07 ENCOUNTER — Other Ambulatory Visit
Admission: RE | Admit: 2023-11-07 | Discharge: 2023-11-07 | Disposition: A | Discharge status: Home / Self Care | Source: Home / Self Care | Attending: Urology | Admitting: Urology

## 2023-11-07 ENCOUNTER — Ambulatory Visit: Payer: PPO | Admitting: Urology

## 2023-11-07 ENCOUNTER — Ambulatory Visit
Admission: RE | Admit: 2023-11-07 | Discharge: 2023-11-07 | Disposition: A | Discharge status: Home / Self Care | Source: Ambulatory Visit | Attending: Urology

## 2023-11-07 VITALS — BP 154/91 | HR 67 | Ht 67.9 in | Wt 269.0 lb

## 2023-11-07 DIAGNOSIS — R339 Retention of urine, unspecified: Secondary | ICD-10-CM | POA: Insufficient documentation

## 2023-11-07 DIAGNOSIS — N401 Enlarged prostate with lower urinary tract symptoms: Secondary | ICD-10-CM

## 2023-11-07 DIAGNOSIS — R3129 Other microscopic hematuria: Secondary | ICD-10-CM | POA: Diagnosis not present

## 2023-11-07 DIAGNOSIS — R109 Unspecified abdominal pain: Secondary | ICD-10-CM | POA: Diagnosis not present

## 2023-11-07 DIAGNOSIS — N138 Other obstructive and reflux uropathy: Secondary | ICD-10-CM | POA: Insufficient documentation

## 2023-11-07 LAB — URINALYSIS, COMPLETE (UACMP) WITH MICROSCOPIC
Bilirubin Urine: NEGATIVE
Glucose, UA: NEGATIVE mg/dL
Nitrite: POSITIVE — AB
Protein, ur: 100 mg/dL — AB
RBC / HPF: >50 RBC/hpf (ref 0–5)
Specific Gravity, Urine: >1.03 — ABNORMAL HIGH (ref 1.005–1.030)
pH: 5.5 (ref 5.0–8.0)

## 2023-11-07 LAB — BLADDER SCAN AMB NON-IMAGING

## 2023-11-09 ENCOUNTER — Other Ambulatory Visit: Payer: Self-pay

## 2023-11-09 LAB — URINE CULTURE: Culture: >=100000 — AB

## 2023-11-09 MED ORDER — CEFUROXIME AXETIL 500 MG PO TABS: 500.0000 mg | ORAL_TABLET | Freq: Two times a day (BID) | ORAL | 0 refills | Status: DC

## 2023-11-28 ENCOUNTER — Encounter: Payer: Self-pay | Admitting: Family Medicine

## 2023-12-02 ENCOUNTER — Ambulatory Visit: Admitting: Family Medicine

## 2023-12-15 ENCOUNTER — Ambulatory Visit: Payer: Self-pay

## 2023-12-17 NOTE — Patient Instructions (Signed)

## 2023-12-19 ENCOUNTER — Encounter: Payer: Self-pay | Admitting: Nurse Practitioner

## 2023-12-19 ENCOUNTER — Ambulatory Visit (INDEPENDENT_AMBULATORY_CARE_PROVIDER_SITE_OTHER): Admitting: Nurse Practitioner

## 2023-12-19 VITALS — BP 139/89 | HR 58 | Temp 97.8°F | Resp 17 | Ht 67.91 in | Wt 261.8 lb

## 2023-12-19 DIAGNOSIS — R21 Rash and other nonspecific skin eruption: Secondary | ICD-10-CM | POA: Insufficient documentation

## 2023-12-19 DIAGNOSIS — L089 Local infection of the skin and subcutaneous tissue, unspecified: Secondary | ICD-10-CM | POA: Insufficient documentation

## 2023-12-19 MED ORDER — DOXYCYCLINE HYCLATE 100 MG PO TABS: 100.0000 mg | ORAL_TABLET | Freq: Two times a day (BID) | ORAL | 0 refills | Status: AC

## 2023-12-19 MED ORDER — TRIAMCINOLONE ACETONIDE 0.1 % EX OINT: 1.0000 | TOPICAL_OINTMENT | Freq: Two times a day (BID) | CUTANEOUS | 1 refills | Status: AC

## 2023-12-19 MED ORDER — MUPIROCIN 2 % EX OINT: 1.0000 | TOPICAL_OINTMENT | Freq: Two times a day (BID) | CUTANEOUS | 0 refills | Status: DC

## 2023-12-19 NOTE — Assessment & Plan Note (Addendum)
 Ongoing, not 100%.  Will start Doxycycline  100 MG BID, but extend for 10 days + refill Mupirocin  and recommend he apply to areas BID.  Refill on Triamcinolone  cream sent for placement on poison oak right inner forearm.  Will avoid steroid orally as recent A1c trending up some.  Recommend he purchase some OTC wound cleanser, like Hibiclens, to clean areas of folliculitis.

## 2023-12-19 NOTE — Progress Notes (Signed)
 BP 139/89 (BP Location: Left Arm, Patient Position: Sitting, Cuff Size: Large)   Pulse (!) 58   Temp 97.8 F (36.6 C) (Oral)   Resp 17   Ht 5' 7.91" (1.725 m)   Wt 261 lb 12.8 oz (118.8 kg)   SpO2 98%   BMI 39.91 kg/m    Subjective:    Patient ID: Derrick Sandoval., male    DOB: 1956/01/02, 68 y.o.   MRN: [id number]  HPI: Derrick Sandoval. is a 68 y.o. male  Chief Complaint  Patient presents with   Hair/Scalp Problem    Ongoing hair follicle infections for a few months. Also fell and landed in poison ivy or something similar this weekend.    SKIN INFECTION Ongoing issues with hair follicle infections + currently has a small dose of poison oak that happened on weekend after chasing his dog.  Last hair follicle infection treatment was on 10/24/23 with Doxycycline  -- to right arm area, ears, and abdomen.  States areas are clearing, but not 100% as of yet. Duration: weeks Location: as above History of trauma in area: no Pain: no Quality: no Severity: mild Redness: yes Swelling: no Oozing: no Pus: no Fevers: no Nausea/vomiting: no Status: fluctuating Treatments attempted:antibiotics and warm compresses + creams and poison okay soap Tetanus: UTD   Relevant past medical, surgical, family and social history reviewed and updated as indicated. Interim medical history since our last visit reviewed. Allergies and medications reviewed and updated.  Review of Systems  Constitutional:  Negative for activity change, diaphoresis, fatigue and fever.  Respiratory:  Negative for cough, chest tightness, shortness of breath and wheezing.   Cardiovascular:  Negative for chest pain, palpitations and leg swelling.  Gastrointestinal: Negative.   Skin:  Positive for rash and wound.  Neurological: Negative.   Psychiatric/Behavioral: Negative.      Per HPI unless specifically indicated above     Objective:    BP 139/89 (BP Location: Left Arm, Patient Position: Sitting, Cuff Size:  Large)   Pulse (!) 58   Temp 97.8 F (36.6 C) (Oral)   Resp 17   Ht 5' 7.91" (1.725 m)   Wt 261 lb 12.8 oz (118.8 kg)   SpO2 98%   BMI 39.91 kg/m   Wt Readings from Last 3 Encounters:  12/19/23 261 lb 12.8 oz (118.8 kg)  11/07/23 269 lb (122 kg)  10/24/23 268 lb 12.8 oz (121.9 kg)    Physical Exam Vitals and nursing note reviewed.  Constitutional:      General: He is awake. He is not in acute distress.    Appearance: He is well-developed and well-groomed. He is obese. He is not ill-appearing or toxic-appearing.  HENT:     Head: Normocephalic.     Right Ear: Hearing and external ear normal.     Left Ear: Hearing and external ear normal.  Eyes:     General: Lids are normal.     Extraocular Movements: Extraocular movements intact.     Conjunctiva/sclera: Conjunctivae normal.  Neck:     Thyroid : No thyromegaly.     Vascular: No carotid bruit.  Cardiovascular:     Rate and Rhythm: Normal rate and regular rhythm.     Heart sounds: Normal heart sounds. No murmur heard.    No gallop.  Pulmonary:     Effort: No accessory muscle usage or respiratory distress.     Breath sounds: Normal breath sounds.  Abdominal:     General: Bowel sounds  are normal. There is no distension.     Palpations: Abdomen is soft.     Tenderness: There is no abdominal tenderness.  Musculoskeletal:     Cervical back: Full passive range of motion without pain.     Right lower leg: No edema.     Left lower leg: No edema.  Lymphadenopathy:     Cervical: No cervical adenopathy.  Skin:    General: Skin is warm.     Capillary Refill: Capillary refill takes less than 2 seconds.     Findings: Lesion and rash present.     Comments: Multiple scattered, round areas of inflammation with crusting and scaling to exterior -- right arm, abdomen area.  Small patch of raised rash to inner right forearm with small blistering.  Neurological:     Mental Status: He is alert and oriented to person, place, and time.      Deep Tendon Reflexes: Reflexes are normal and symmetric.     Reflex Scores:      Brachioradialis reflexes are 2+ on the right side and 2+ on the left side.      Patellar reflexes are 2+ on the right side and 2+ on the left side. Psychiatric:        Attention and Perception: Attention normal.        Mood and Affect: Mood normal.        Speech: Speech normal.        Behavior: Behavior normal. Behavior is cooperative.        Thought Content: Thought content normal.     Results for orders placed or performed during the hospital encounter of 11/07/23  Urine Culture   Collection Time: 11/07/23  8:42 AM   Specimen: Urine, Random  Result Value Ref Range   Specimen Description      URINE, RANDOM Performed at [facility], [address] [place], [place] [address]    Special Requests      NONE Performed at [facility], [address] [place], [place] [address]    Culture >=100,000 COLONIES/mL KLEBSIELLA PNEUMONIAE (A)    Report Status 11/09/2023 FINAL    Organism ID, Bacteria KLEBSIELLA PNEUMONIAE (A)       Susceptibility   Klebsiella pneumoniae - MIC*    AMPICILLIN >=32 RESISTANT Resistant     CEFAZOLIN <=4 SENSITIVE Sensitive     CEFEPIME <=0.12 SENSITIVE Sensitive     CEFTRIAXONE <=0.25 SENSITIVE Sensitive     CIPROFLOXACIN  <=0.25 SENSITIVE Sensitive     GENTAMICIN <=1 SENSITIVE Sensitive     IMIPENEM 0.5 SENSITIVE Sensitive     NITROFURANTOIN  64 INTERMEDIATE Intermediate     TRIMETH /SULFA  <=20 SENSITIVE Sensitive     AMPICILLIN/SULBACTAM 8 SENSITIVE Sensitive     PIP/TAZO <=4 SENSITIVE Sensitive ug/mL    * >=100,000 COLONIES/mL KLEBSIELLA PNEUMONIAE  Urinalysis, Complete w Microscopic -   Collection Time: 11/07/23  8:42 AM  Result Value Ref Range   Color, Urine YELLOW YELLOW   APPearance CLOUDY (A) CLEAR   Specific Gravity, Urine >1.030 (H) 1.005 - 1.030   pH 5.5 5.0 - 8.0   Glucose, UA NEGATIVE NEGATIVE mg/dL   Hgb urine dipstick MODERATE (A)  NEGATIVE   Bilirubin Urine NEGATIVE NEGATIVE   Ketones, ur TRACE (A) NEGATIVE mg/dL   Protein, ur 098 (A) NEGATIVE mg/dL   Nitrite POSITIVE (A) NEGATIVE   Leukocytes,Ua SMALL (A) NEGATIVE   Squamous Epithelial / HPF 0-5 0 - 5 /HPF   WBC, UA  21-50 0 - 5 WBC/hpf   RBC / HPF >50 0 - 5 RBC/hpf   Bacteria, UA MANY (A) NONE SEEN   Mucus PRESENT    Ca Oxalate Crys, UA PRESENT       Assessment & Plan:   Problem List Items Addressed This Visit       Musculoskeletal and Integument   Skin infection - Primary   Ongoing, not 100%.  Will start Doxycycline  100 MG BID, but extend for 10 days + refill Mupirocin  and recommend he apply to areas BID.  Refill on Triamcinolone  cream sent for placement on poison oak right inner forearm.  Will avoid steroid orally as recent A1c trending up some.  Recommend he purchase some OTC wound cleanser, like Hibiclens, to clean areas of folliculitis.        Relevant Medications   mupirocin  ointment (BACTROBAN ) 2 %     Follow up plan: Return if symptoms worsen or fail to improve.

## 2023-12-30 ENCOUNTER — Encounter: Payer: Self-pay | Admitting: Family Medicine

## 2023-12-31 NOTE — Patient Instructions (Signed)
Tick Bite Information, Adult  Ticks are insects that can bite. Most ticks live in bushes and grassy areas. They climb onto people and animals that go by. Then they bite. Some ticks carry germs that can make you sick. How can I prevent tick bites? Take these steps: Before you go outside: Wear long sleeves and long pants. Wear light-colored clothes. Tuck your pant legs into your socks. Use an insect repellent that has 20% or higher of the ingredients DEET, picaridin, or IR3535. Follow the instructions on the label. Put it on: Bare skin. Avoid your eyes and mouth areas. The tops of your boots. Your pant legs. The ends of your sleeves. If you use an insect repellent that has the ingredient permethrin, follow the instructions on the label. Do not put permethrin on the skin. Put it on: Clothing. Shoes. Outdoor gear. Tents. When you are outside Avoid walking through long grass. Stay in the middle of the trail. Do not touch the bushes. Check for ticks on your clothes, hair, and skin often while you are outside. Check again before you go inside. When you go indoors Check your clothes for ticks. Dry your clothes in a dryer on high heat for 10 minutes or more. If clothes are damp, more time may be needed. Wash your clothes right away if they need to be washed. Use hot water. Check your pets and outdoor gear. Shower right away. Check your body for ticks. Do a full body check using a mirror. Check your clothes, skin, head, neck, armpits, waist, groin, and joint areas. What is the right way to remove a tick? Remove the tick from your skin as soon as possible. Do not remove the tick with your bare fingers. Do not try to remove a tick with heat, alcohol, petroleum jelly, or fingernail polish. To remove a tick that is crawling on your skin: Go outside and brush the tick off. Use tape or a lint roller. To remove a tick that is biting: Wash your hands. If you have gloves, put them on. Use tweezers,  curved forceps, or a tick-removal tool to grasp the tick. Grasp the tick as close to your skin and as close to the tick's head as possible. Gently pull up until the tick lets go. Try to keep the tick's head attached to its body. Do not twist or jerk the tick. Do not squeeze or crush the tick. What should I do after taking out a tick? Clean the bite area and your hands with soap and water, rubbing alcohol, or an iodine wash. If you have an antiseptic cream or ointment, put a small amount on the bite area. Wash and disinfect any instruments that you used to remove the tick. How should I get rid of a live tick? To get rid of a live tick, use one of these methods: Place the tick in rubbing alcohol. Place it in a bag or container you can close tightly. Throw it away. Wrap it tightly in tape. Throw it away. Flush it down the toilet. Where to find more information Centers for Disease Control and Prevention: cdc.gov/ticks U.S. Environmental Protection Agency: epa.gov/insect-repellents Contact a doctor if: You have a fever or chills. You have a red rash that makes a circle (bull's-eye rash) in the bite area. You have redness and swelling where the tick bit you. You have a headache or stiff neck. You have pain in a muscle, joint, or bone. You are more tired than normal. You have trouble walking or   moving your legs. You have numbness in your legs. You have tender or swollen lymph glands. You have belly (abdominal) pain, vomiting, watery poop (diarrhea), or weight loss. Get help right away if: You cannot remove a tick. You cannot move (have paralysis) or feel weak. You are feeling worse or have new symptoms. You find a tick that is biting you and filled with blood, especially if you are in an area where diseases from ticks are common. Summary Ticks may carry germs that can make you sick. To prevent tick bites wear long sleeves, long pants, and light colors. Use insect repellent. Follow the  instructions on the label. If the tick is biting, remove it right away. Do not try to remove it with heat, alcohol, petroleum jelly, or fingernail polish. Contact a doctor if you have symptoms of a disease after being bitten by a tick. This information is not intended to replace advice given to you by your health care provider. Make sure you discuss any questions you have with your health care provider. Document Revised: 11/02/2021 Document Reviewed: 11/02/2021 Elsevier Patient Education  2024 [employer]  

## 2024-01-02 ENCOUNTER — Ambulatory Visit: Admitting: Nurse Practitioner

## 2024-01-02 ENCOUNTER — Ambulatory Visit: Admitting: Urology

## 2024-01-02 ENCOUNTER — Other Ambulatory Visit: Payer: Self-pay

## 2024-01-02 ENCOUNTER — Encounter: Payer: Self-pay | Admitting: Urology

## 2024-01-02 ENCOUNTER — Other Ambulatory Visit
Admission: RE | Admit: 2024-01-02 | Discharge: 2024-01-02 | Disposition: A | Discharge status: Home / Self Care | Attending: Urology | Admitting: Urology

## 2024-01-02 VITALS — BP 148/96 | HR 64 | Ht 67.0 in | Wt 259.0 lb

## 2024-01-02 DIAGNOSIS — N401 Enlarged prostate with lower urinary tract symptoms: Secondary | ICD-10-CM | POA: Insufficient documentation

## 2024-01-02 DIAGNOSIS — R339 Retention of urine, unspecified: Secondary | ICD-10-CM | POA: Diagnosis not present

## 2024-01-02 DIAGNOSIS — R3129 Other microscopic hematuria: Secondary | ICD-10-CM | POA: Insufficient documentation

## 2024-01-02 DIAGNOSIS — N138 Other obstructive and reflux uropathy: Secondary | ICD-10-CM | POA: Diagnosis not present

## 2024-01-02 LAB — URINALYSIS, COMPLETE (UACMP) WITH MICROSCOPIC
Bilirubin Urine: NEGATIVE
Glucose, UA: NEGATIVE mg/dL
Ketones, ur: NEGATIVE mg/dL
Leukocytes,Ua: NEGATIVE
Nitrite: NEGATIVE
Protein, ur: NEGATIVE mg/dL
Specific Gravity, Urine: 1.015 (ref 1.005–1.030)
pH: 5.5 (ref 5.0–8.0)

## 2024-01-02 NOTE — Patient Instructions (Addendum)
 Please call scheduling at [phone] to schedule your CT scan    Cystoscopy Cystoscopy is a procedure that is used to help diagnose and sometimes treat conditions that affect the lower urinary tract. The lower urinary tract includes the bladder and the urethra. The urethra is the tube that drains urine from the bladder. Cystoscopy is done using a thin, tube-shaped instrument with a light and camera at the end (cystoscope). The cystoscope may be hard or flexible, depending on the goal of the procedure. The cystoscope is inserted through the urethra, into the bladder. Cystoscopy may be recommended if you have: Urinary tract infections that keep coming back. Blood in the urine (hematuria). An inability to control when you urinate (urinary incontinence) or an overactive bladder. Unusual cells found in a urine sample. A blockage in the urethra, such as a urinary stone. Painful urination. An abnormality in the bladder found during an intravenous pyelogram (IVP) or CT scan. What are the risks? Generally, this is a safe procedure. However, problems may occur, including: Infection. Bleeding.  What happens during the procedure?  You will be given one or more of the following: A medicine to numb the area (local anesthetic). The area around the opening of your urethra will be cleaned. The cystoscope will be passed through your urethra into your bladder. Germ-free (sterile) fluid will flow through the cystoscope to fill your bladder. The fluid will stretch your bladder so that your health care provider can clearly examine your bladder walls. Your doctor will look at the urethra and bladder. The cystoscope will be removed The procedure may vary among health care providers  What can I expect after the procedure? After the procedure, it is common to have: Some soreness or pain in your urethra. Urinary symptoms. These include: Mild pain or burning when you urinate. Pain should stop within a few  minutes after you urinate. This may last for up to a few days after the procedure. A small amount of blood in your urine for several days. Feeling like you need to urinate but producing only a small amount of urine. Follow these instructions at home: General instructions Return to your normal activities as told by your health care provider.  Drink plenty of fluids after the procedure. Keep all follow-up visits as told by your health care provider. This is important. Contact a health care provider if you: Have pain that gets worse or does not get better with medicine, especially pain when you urinate lasting longer than 72 hours after the procedure. Have trouble urinating. Get help right away if you: Have blood clots in your urine. Have a fever or chills. Are unable to urinate. Summary Cystoscopy is a procedure that is used to help diagnose and sometimes treat conditions that affect the lower urinary tract. Cystoscopy is done using a thin, tube-shaped instrument with a light and camera at the end. After the procedure, it is common to have some soreness or pain in your urethra. It is normal to have blood in your urine after the procedure.  If you were prescribed an antibiotic medicine, take it as told by your health care provider.  This information is not intended to replace advice given to you by your health care provider. Make sure you discuss any questions you have with your health care provider. Document Revised: 07/25/2018 Document Reviewed: 07/25/2018 Elsevier Patient Education  2020 [employer].

## 2024-01-02 NOTE — Progress Notes (Signed)
 01/02/24 10:46 AM   Derrick Sandoval. 1955/09/29 [id number]  Referring provider:  Solomon Dupre, DO [address] [place],  [place] [address]  Urological history: 1. Incomplete bladder emptying -TUIP w/ Dr. Aram Knights (2015) -UDS (2019) incomplete bladder emptying with wide fill of porstatic urethra  -recommended to have an Interstim placed by Dr. Alyn Babe, deferred -currently cathing four times daily   2. BPH with LU TS -PSA (10/2023) 0.2 -cysto (2019) - large TURP defect noted.  Wide open prostate fossa.  -cysto (2022) - small amount of prostatic anterior tissue  -tamsulosin  0.4 mg daily and CIC  3. Orchitis and epididymitis -had episode in 2021   4.  Asymptomatic bacteriuria - Likely colonized due to self cathing   Hematuria and Benign Prostatic Hypertrophy   HPI: Derrick Sandoval. is a 68 y.o.male who presents today for a 75-month follow-up.  Previous records reviewed.     At his visit on November 07, 2023,  I PSS 7/6.  PVR 43 mL.  UA yellow cloudy, specific gravity greater than 1.030, pH 5.5, moderate heme, trace ketone, 100 protein, nitrite positive, small leukocytes, 21-50 WBCs, greater than 50 RBCs, many bacteria, mucus present and calcium oxalate present.  Urine culture was positive for Klebsiella pneumoniae.  He was treated with 7 days of culture appropriate antibiotics.  He is not happy with his urinary condition because he has to catheterize himself.  He is catheterizing 3 times daily.  He does still void spontaneously, but has to strain to do so.  He is taking tamsulosin  0.4 mg daily.  He cannot have the InterStim placed with Dr. Wilton Hasting because he does not have any transportation to [facility].  KUB No stones seen.   Today's UA yellow hazy, specific gravity 1.015, pH 5.5, trace heme, 0-5 squames, 0-5 WBCs, 6-10 RBCs, few bacteria, WBC clumps present and mucus present.  He is feeling well today.  He is not having any issues with cathing.  He does not need a prescription  for catheters at this time.  Patient denies any modifying or aggravating factors.  Patient denies any recent UTI's, gross hematuria, dysuria or suprapubic/flank pain.  Patient denies any fevers, chills, nausea or vomiting.     PMH: Past Medical History:  Diagnosis Date   Allergy    Arthritis    Cervical radiculopathy 01/16/2021   Cough    nonproductive no fever saw at dr 05-26-18   GERD (gastroesophageal reflux disease)    Hyperlipidemia    Hypertension    Meningitis due to viruses 1968    Surgical History: Past Surgical History:  Procedure Laterality Date   COLONOSCOPY WITH PROPOFOL  N/A 05/12/2018   Procedure: COLONOSCOPY WITH PROPOFOL ;  Surgeon: Luke Salaam, MD;  Location: [facility] ENDOSCOPY;  Service: Gastroenterology;  Laterality: N/A;   ESOPHAGOGASTRODUODENOSCOPY (EGD) WITH PROPOFOL  N/A 05/12/2018   Procedure: ESOPHAGOGASTRODUODENOSCOPY (EGD) WITH PROPOFOL ;  Surgeon: Luke Salaam, MD;  Location: [facility] ENDOSCOPY;  Service: Gastroenterology;  Laterality: N/A;   ESOPHAGOGASTRODUODENOSCOPY (EGD) WITH PROPOFOL  N/A 06/01/2018   Procedure: ESOPHAGOGASTRODUODENOSCOPY (EGD) WITH PROPOFOL ;  Surgeon: Janel Medford, MD;  Location: [facility] ENDOSCOPY;  Service: Endoscopy;  Laterality: N/A;   EUS N/A 06/01/2018   Procedure: UPPER ENDOSCOPIC ULTRASOUND (EUS) RADIAL;  Surgeon: Janel Medford, MD;  Location: [facility] ENDOSCOPY;  Service: Endoscopy;  Laterality: N/A;   JOINT REPLACEMENT Bilateral    NASAL SINUS SURGERY     TOTAL KNEE ARTHROPLASTY      Home Medications:  Allergies as of  01/02/2024   No Known Allergies      Medication List        Accurate as of Jan 02, 2024 10:46 AM. If you have any questions, ask your nurse or doctor.          STOP taking these medications    cyclobenzaprine  10 MG tablet Commonly known as: FLEXERIL  Stopped by: Matilde Son   guaiFENesin  600 MG 12 hr tablet Commonly known as: Mucinex  Stopped by: Matilde Son   lubiprostone  8 MCG capsule Commonly  known as: AMITIZA  Stopped by: Gurtha Picker       TAKE these medications    acyclovir  400 MG tablet Commonly known as: ZOVIRAX  Take 1 tablet (400 mg total) by mouth 2 (two) times daily.   albuterol  108 (90 Base) MCG/ACT inhaler Commonly known as: VENTOLIN  HFA Inhale 1-2 puffs into the lungs every 6 (six) hours as needed for wheezing or shortness of breath.   aspirin EC 81 MG tablet Take 81 mg by mouth daily.   azelastine 0.1 % nasal spray Commonly known as: ASTELIN SMARTSIG:1-2 Spray(s) Both Nares Twice Daily   Breztri  Aerosphere 160-9-4.8 MCG/ACT Aero inhaler Generic drug: budesonide -glycopyrrolate -formoterol  Inhale 2 puffs into the lungs 2 (two) times daily.   cetirizine  10 MG tablet Commonly known as: ZYRTEC  Take 1 tablet (10 mg total) by mouth daily.   cholecalciferol 1000 units tablet Commonly known as: VITAMIN D  Take 1,000 Units by mouth daily.   diclofenac  Sodium 1 % Gel Commonly known as: Voltaren  Apply 4 g topically 4 (four) times daily.   famotidine  20 MG tablet Commonly known as: PEPCID  Take 1 tablet (20 mg total) by mouth 2 (two) times daily.   fluticasone  50 MCG/ACT nasal spray Commonly known as: FLONASE  Place 2 sprays into both nostrils daily.   lisinopril  20 MG tablet Commonly known as: ZESTRIL  Take 1 tablet (20 mg total) by mouth daily.   methocarbamol 500 MG tablet Commonly known as: ROBAXIN Take 500 mg by mouth daily as needed.   montelukast  10 MG tablet Commonly known as: SINGULAIR  Take 1 tablet (10 mg total) by mouth at bedtime.   multivitamin with minerals tablet Take 1 tablet by mouth daily.   mupirocin  ointment 2 % Commonly known as: BACTROBAN  Apply 1 Application topically 2 (two) times daily.   naproxen  sodium 220 MG tablet Commonly known as: ALEVE  Take 220 mg by mouth daily as needed.   nystatin  cream Commonly known as: MYCOSTATIN  Apply 1 Application topically 2 (two) times daily.   omeprazole  40 MG capsule Commonly  known as: PRILOSEC Take 1 capsule (40 mg total) by mouth daily.   sertraline  100 MG tablet Commonly known as: ZOLOFT  Take 1 tablet (100 mg total) by mouth daily.   tamsulosin  0.4 MG Caps capsule Commonly known as: FLOMAX  Take 1 capsule (0.4 mg total) by mouth daily.   triamcinolone  ointment 0.1 % Commonly known as: KENALOG  Apply 1 Application topically 2 (two) times daily.   vitamin C 1000 MG tablet Take 1,000 mg by mouth at bedtime.        Allergies:  No Known Allergies  Family History: Family History  Problem Relation Age of Onset   Breast cancer Mother    Cancer Father        unsure, passed away before it was confirmed   Pancreatic cancer Daughter    Diabetes Neg Hx    Heart disease Neg Hx    Hypertension Neg Hx    Stroke Neg Hx    COPD  Neg Hx    Colon cancer Neg Hx     Social History:  reports that he has never smoked. He has never been exposed to tobacco smoke. He has never used smokeless tobacco. He reports that he does not currently use alcohol. He reports that he does not use drugs.   Physical Exam: BP (!) 148/96   Pulse 64   Ht 5\' 7"  (1.702 m)   Wt 259 lb (117.5 kg)   BMI 40.57 kg/m   Constitutional:  Well nourished. Alert and oriented, No acute distress. HEENT: [place] AT, moist mucus membranes.  Trachea midline Cardiovascular: No clubbing, cyanosis, or edema. Respiratory: Normal respiratory effort, no increased work of breathing. Neurologic: Grossly intact, no focal deficits, moving all 4 extremities. Psychiatric: Normal mood and affect.   Laboratory Data: Urinalysis See EPIC and HPI I have reviewed the labs.   Pertinent imaging. N/A  Assessment & Plan:    1.  Microscopic hematuria -UA still w/ micro heme -urine culture pending as we are scheduling him for cysto  - Discussed that it has been since 2018 since he has had upper tract imaging and his last cystoscopy was in 2019 and since he has persistent microscopic hematuria I would recommend  repeating the studies.  I did admit that the microscopic hematuria just may be the result of him catheterizing himself, but we need to make sure that he does not have an underlying malignancy or anatomical abnormality or stones causing the microscopic hematuria - He denies any allergies to iodine or seafood - He is familiar with cystoscopy - He agrees with our plan and we will schedule a CT urogram and cystoscopy at this time  2. BPH with LUTS -PSA stable -PVR's demonstrates adequate emptying with self catheterization -continue conservative management, avoiding bladder irritants and timed voiding's -tamsulosin  0.4 mg daily  3. Incomplete bladder emptying -Patient caths 3 times daily, indefinitely due to stricture disease and BPH.  Patient requires coude caths because he has prostate enlargement and cannot advance straight catheter.     Return for CTU report and cysto for micro heme .  Matilde Son, PA-C   [facility]  [facility]) [address], [address] [place], [place] [address] [phone]

## 2024-01-03 ENCOUNTER — Encounter: Payer: Self-pay | Admitting: Nurse Practitioner

## 2024-01-03 ENCOUNTER — Ambulatory Visit (INDEPENDENT_AMBULATORY_CARE_PROVIDER_SITE_OTHER): Admitting: Nurse Practitioner

## 2024-01-03 VITALS — BP 154/85 | HR 57 | Temp 97.6°F | Ht 67.0 in | Wt 261.8 lb

## 2024-01-03 DIAGNOSIS — R21 Rash and other nonspecific skin eruption: Secondary | ICD-10-CM | POA: Diagnosis not present

## 2024-01-03 LAB — URINE CULTURE: Culture: NO GROWTH

## 2024-01-03 MED ORDER — MUPIROCIN 2 % EX OINT: 1.0000 | TOPICAL_OINTMENT | Freq: Two times a day (BID) | CUTANEOUS | 3 refills | Status: AC

## 2024-01-03 NOTE — Progress Notes (Signed)
 BP (!) 154/85   Pulse (!) 57   Temp 97.6 F (36.4 C) (Oral)   Ht 5\' 7"  (1.702 m)   Wt 261 lb 12.8 oz (118.8 kg)   SpO2 95%   BMI 41.00 kg/m    Subjective:    Patient ID: Derrick Evener., male    DOB: 04-Jul-1956, 68 y.o.   MRN: [id number]  HPI: Derrick Demmer. is a 67 y.o. male  Chief Complaint  Patient presents with   Rash    Patient states he has had a rash on his stomach that he does not remember when it first came up. States he thinks he saw a black spot last week so he tried to squeeze the area, states blood and clear liquid came from the area. Does not know if he was bit by a tick or not.    RASH Has a rash to his right side, he pulled something off and it looked like a little black spot. Then it started bleeding and clear liquid came out, this scabbed over. He pulled scab off and reports there was a little black spot there, he squeezed on that.  After all of this a rash showed up on his abdomen. Duration:  weeks  Location: trunk  Itching: no Burning: no Redness: yes Oozing: clear drainage when pushed on it Scaling: no Blisters: no Painful: no Fevers: no Change in detergents/soaps/personal care products: no Recent illness: no Recent travel:no History of same: gets folliculitis Context: stable Alleviating factors: triple abx ointment Treatments attempted: triple abx ointment and squeezing on area Shortness of breath: no  Throat/tongue swelling: no Myalgias/arthralgias: no   Relevant past medical, surgical, family and social history reviewed and updated as indicated. Interim medical history since our last visit reviewed. Allergies and medications reviewed and updated.  Review of Systems  Constitutional:  Negative for activity change, diaphoresis, fatigue and fever.  Respiratory:  Negative for cough, chest tightness, shortness of breath and wheezing.   Cardiovascular:  Negative for chest pain, palpitations and leg swelling.  Gastrointestinal: Negative.    Skin:  Positive for rash.  Neurological: Negative.   Psychiatric/Behavioral: Negative.      Per HPI unless specifically indicated above     Objective:     BP (!) 154/85   Pulse (!) 57   Temp 97.6 F (36.4 C) (Oral)   Ht 5\' 7"  (1.702 m)   Wt 261 lb 12.8 oz (118.8 kg)   SpO2 95%   BMI 41.00 kg/m   Wt Readings from Last 3 Encounters:  01/03/24 261 lb 12.8 oz (118.8 kg)  01/02/24 259 lb (117.5 kg)  12/19/23 261 lb 12.8 oz (118.8 kg)    Physical Exam Vitals and nursing note reviewed.  Constitutional:      General: He is awake. He is not in acute distress.    Appearance: He is well-developed and well-groomed. He is obese. He is not ill-appearing or toxic-appearing.  HENT:     Head: Normocephalic.     Right Ear: Hearing and external ear normal.     Left Ear: Hearing and external ear normal.  Eyes:     General: Lids are normal.     Extraocular Movements: Extraocular movements intact.     Conjunctiva/sclera: Conjunctivae normal.  Neck:     Thyroid : No thyromegaly.     Vascular: No carotid bruit.  Cardiovascular:     Rate and Rhythm: Normal rate and regular rhythm.     Heart sounds: Normal  heart sounds. No murmur heard.    No gallop.  Pulmonary:     Effort: No accessory muscle usage or respiratory distress.     Breath sounds: Normal breath sounds.  Abdominal:     General: Bowel sounds are normal. There is no distension.     Palpations: Abdomen is soft.     Tenderness: There is no abdominal tenderness.  Musculoskeletal:     Cervical back: Full passive range of motion without pain.     Right lower leg: No edema.     Left lower leg: No edema.  Lymphadenopathy:     Cervical: No cervical adenopathy.  Skin:    General: Skin is warm.     Capillary Refill: Capillary refill takes less than 2 seconds.     Findings: Rash present.          Comments: Small scattered rash to abdomen, pale pink in color and some old scars from previous infections.  Skin intact with no warmth  or tenderness. No vesicles.  Neurological:     Mental Status: He is alert and oriented to person, place, and time.     Deep Tendon Reflexes: Reflexes are normal and symmetric.     Reflex Scores:      Brachioradialis reflexes are 2+ on the right side and 2+ on the left side.      Patellar reflexes are 2+ on the right side and 2+ on the left side. Psychiatric:        Attention and Perception: Attention normal.        Mood and Affect: Mood normal.        Speech: Speech normal.        Behavior: Behavior normal. Behavior is cooperative.        Thought Content: Thought content normal.     Results for orders placed or performed during the hospital encounter of 01/02/24  Urinalysis, Complete w Microscopic -   Collection Time: 01/02/24 10:06 AM  Result Value Ref Range   Color, Urine YELLOW YELLOW   APPearance HAZY (A) CLEAR   Specific Gravity, Urine 1.015 1.005 - 1.030   pH 5.5 5.0 - 8.0   Glucose, UA NEGATIVE NEGATIVE mg/dL   Hgb urine dipstick TRACE (A) NEGATIVE   Bilirubin Urine NEGATIVE NEGATIVE   Ketones, ur NEGATIVE NEGATIVE mg/dL   Protein, ur NEGATIVE NEGATIVE mg/dL   Nitrite NEGATIVE NEGATIVE   Leukocytes,Ua NEGATIVE NEGATIVE   Squamous Epithelial / HPF 0-5 0 - 5 /HPF   WBC, UA 0-5 0 - 5 WBC/hpf   RBC / HPF 6-10 0 - 5 RBC/hpf   Bacteria, UA FEW (A) NONE SEEN   WBC Clumps PRESENT    Mucus PRESENT       Assessment & Plan:   Problem List Items Addressed This Visit       Musculoskeletal and Integument   Rash - Primary   Stable with no signs or symptoms of infection.  Overall areas are healing.  Do not suspect tick bite to leg based on assessment.  Recommend he continue OTC wound cleanser, like Hibiclens, to clean areas of folliculitis.  Apply Mupirocin  as needed.        Follow up plan: Return if symptoms worsen or fail to improve.

## 2024-01-03 NOTE — Assessment & Plan Note (Addendum)
 Stable with no signs or symptoms of infection.  Overall areas are healing.  Do not suspect tick bite to leg based on assessment.  Recommend he continue OTC wound cleanser, like Hibiclens, to clean areas of folliculitis.  Apply Mupirocin  as needed.

## 2024-01-13 ENCOUNTER — Telehealth: Payer: Self-pay

## 2024-01-13 NOTE — Telephone Encounter (Signed)
 Records faxed to [facility] via [facility]-  01/10/23-01/10/24  [place]- 1238pm

## 2024-01-19 DIAGNOSIS — R339 Retention of urine, unspecified: Secondary | ICD-10-CM | POA: Diagnosis not present

## 2024-01-26 ENCOUNTER — Ambulatory Visit
Admission: RE | Admit: 2024-01-26 | Discharge: 2024-01-26 | Disposition: A | Discharge status: Home / Self Care | Source: Ambulatory Visit | Attending: Urology | Admitting: Urology

## 2024-01-26 DIAGNOSIS — K4090 Unilateral inguinal hernia, without obstruction or gangrene, not specified as recurrent: Secondary | ICD-10-CM | POA: Diagnosis not present

## 2024-01-26 DIAGNOSIS — R3129 Other microscopic hematuria: Secondary | ICD-10-CM | POA: Diagnosis not present

## 2024-01-26 DIAGNOSIS — K429 Umbilical hernia without obstruction or gangrene: Secondary | ICD-10-CM | POA: Diagnosis not present

## 2024-01-26 DIAGNOSIS — K5730 Diverticulosis of large intestine without perforation or abscess without bleeding: Secondary | ICD-10-CM | POA: Diagnosis not present

## 2024-01-26 MED ORDER — IOHEXOL 300 MG/ML  SOLN
100.0000 mL | Freq: Once | INTRAMUSCULAR | Status: AC | PRN
  Administered 2024-01-26: 100 mL via INTRAVENOUS

## 2024-01-27 ENCOUNTER — Encounter: Payer: Self-pay | Admitting: Family Medicine

## 2024-01-27 ENCOUNTER — Ambulatory Visit: Payer: Self-pay

## 2024-01-28 ENCOUNTER — Other Ambulatory Visit: Payer: Self-pay | Admitting: Family Medicine

## 2024-01-28 ENCOUNTER — Other Ambulatory Visit: Payer: Self-pay | Admitting: Urology

## 2024-01-28 DIAGNOSIS — R339 Retention of urine, unspecified: Secondary | ICD-10-CM

## 2024-01-28 DIAGNOSIS — N138 Other obstructive and reflux uropathy: Secondary | ICD-10-CM

## 2024-01-31 ENCOUNTER — Ambulatory Visit: Admitting: Urology

## 2024-01-31 VITALS — BP 164/91 | HR 60

## 2024-01-31 DIAGNOSIS — R3129 Other microscopic hematuria: Secondary | ICD-10-CM

## 2024-01-31 MED ORDER — LIDOCAINE HCL URETHRAL/MUCOSAL 2 % EX GEL
1.0000 | Freq: Once | CUTANEOUS | Status: AC
Start: 2024-01-31 — End: 2024-01-31
  Administered 2024-01-31: 1 via URETHRAL

## 2024-01-31 MED ORDER — SULFAMETHOXAZOLE-TRIMETHOPRIM 800-160 MG PO TABS
1.0000 | ORAL_TABLET | Freq: Once | ORAL | Status: AC
  Administered 2024-01-31: 1 via ORAL

## 2024-01-31 NOTE — Telephone Encounter (Signed)
 Requested Prescriptions  Pending Prescriptions Disp Refills   omeprazole  (PRILOSEC) 40 MG capsule [Pharmacy Med Name: OMEPRAZOLE  DR 40MG   CAP] 90 capsule 0    Sig: Take 1 capsule by mouth once daily     Gastroenterology: Proton Pump Inhibitors Passed - 01/31/2024 10:42 AM      Passed - Valid encounter within last 12 months    Recent Outpatient Visits           4 weeks ago Rash   [place] [facility] [place], Nettie T, NP   1 month ago Skin infection   [place] [facility] [place], Sanjuan Crumbly T, NP   3 months ago Routine general medical examination at a health care facility   [facility] [place], Jerilee Montane, DO       Future Appointments             In 6 months [place], Nyra Bellis [facility] [place]             montelukast  (SINGULAIR ) 10 MG tablet [Pharmacy Med Name: Montelukast  Sodium 10 MG Oral Tablet] 90 tablet 0    Sig: TAKE 1 TABLET BY MOUTH AT BEDTIME     Pulmonology:  Leukotriene Inhibitors Passed - 01/31/2024 10:42 AM      Passed - Valid encounter within last 12 months    Recent Outpatient Visits           4 weeks ago Rash   [place] [facility] [place], Sanjuan Crumbly T, NP   1 month ago Skin infection   [place] [facility] [place], Sanjuan Crumbly T, NP   3 months ago Routine general medical examination at a health care facility   [facility] [place], Jerilee Montane, DO       Future Appointments             In 6 months [place], Nyra Bellis [facility] [place]

## 2024-01-31 NOTE — Progress Notes (Signed)
 Cystoscopy Procedure Note:  Indication: Microscopic hematuria  Bactrim  given for prophylaxis  After informed consent and discussion of the procedure and its risks, Derrick Sandoval. was positioned and prepped in the standard fashion. Cystoscopy was performed with a flexible cystoscope. The urethra, bladder neck and entire bladder was visualized in a standard fashion. The prostate was short with an open prostatic fossa. The ureteral orifices were visualized in their normal location and orientation.  Debris throughout the bladder, likely chronically colonized from CIC.  No bladder mucosal abnormalities.  Imaging: Prostate measures 16g, no  urologic abnormalities  Findings: Normal cystoscopy, suspect catheterization as etiology of microscopic hematuria  Assessment and Plan: Continue CIC 3 times daily for atonic bladder RTC scan in 6 months  Jay Meth, MD 01/31/2024

## 2024-04-30 ENCOUNTER — Ambulatory Visit (INDEPENDENT_AMBULATORY_CARE_PROVIDER_SITE_OTHER): Admitting: Family Medicine

## 2024-04-30 ENCOUNTER — Encounter: Payer: Self-pay | Admitting: Family Medicine

## 2024-04-30 VITALS — BP 134/86 | HR 65 | Ht 67.0 in | Wt 252.6 lb

## 2024-04-30 DIAGNOSIS — Z23 Encounter for immunization: Secondary | ICD-10-CM

## 2024-04-30 DIAGNOSIS — R21 Rash and other nonspecific skin eruption: Secondary | ICD-10-CM

## 2024-04-30 DIAGNOSIS — N138 Other obstructive and reflux uropathy: Secondary | ICD-10-CM | POA: Diagnosis not present

## 2024-04-30 DIAGNOSIS — K760 Fatty (change of) liver, not elsewhere classified: Secondary | ICD-10-CM

## 2024-04-30 DIAGNOSIS — R338 Other retention of urine: Secondary | ICD-10-CM | POA: Diagnosis not present

## 2024-04-30 DIAGNOSIS — R339 Retention of urine, unspecified: Secondary | ICD-10-CM

## 2024-04-30 DIAGNOSIS — E559 Vitamin D deficiency, unspecified: Secondary | ICD-10-CM | POA: Diagnosis not present

## 2024-04-30 DIAGNOSIS — I10 Essential (primary) hypertension: Secondary | ICD-10-CM

## 2024-04-30 DIAGNOSIS — N401 Enlarged prostate with lower urinary tract symptoms: Secondary | ICD-10-CM | POA: Diagnosis not present

## 2024-04-30 DIAGNOSIS — E782 Mixed hyperlipidemia: Secondary | ICD-10-CM

## 2024-04-30 DIAGNOSIS — R454 Irritability and anger: Secondary | ICD-10-CM

## 2024-04-30 LAB — BAYER DCA HB A1C WAIVED: HB A1C (BAYER DCA - WAIVED): 5.6 % (ref 4.8–5.6)

## 2024-04-30 MED ORDER — ACYCLOVIR 400 MG PO TABS: 400.0000 mg | ORAL_TABLET | Freq: Two times a day (BID) | ORAL | 1 refills | Status: AC

## 2024-04-30 MED ORDER — MONTELUKAST SODIUM 10 MG PO TABS: 10.0000 mg | ORAL_TABLET | Freq: Every day | ORAL | 1 refills | Status: AC

## 2024-04-30 MED ORDER — OMEPRAZOLE 40 MG PO CPDR: 40.0000 mg | DELAYED_RELEASE_CAPSULE | Freq: Every day | ORAL | 1 refills | Status: AC

## 2024-04-30 MED ORDER — FAMOTIDINE 20 MG PO TABS: 20.0000 mg | ORAL_TABLET | Freq: Two times a day (BID) | ORAL | 1 refills | Status: AC

## 2024-04-30 MED ORDER — TAMSULOSIN HCL 0.4 MG PO CAPS: 0.4000 mg | ORAL_CAPSULE | Freq: Every day | ORAL | 1 refills | Status: DC

## 2024-04-30 MED ORDER — SERTRALINE HCL 100 MG PO TABS: 100.0000 mg | ORAL_TABLET | Freq: Every day | ORAL | 1 refills | Status: AC

## 2024-04-30 MED ORDER — LISINOPRIL 20 MG PO TABS: 20.0000 mg | ORAL_TABLET | Freq: Every day | ORAL | 1 refills | Status: AC

## 2024-04-30 MED ORDER — METHOCARBAMOL 500 MG PO TABS: 500.0000 mg | ORAL_TABLET | Freq: Every day | ORAL | 0 refills | Status: AC | PRN

## 2024-04-30 NOTE — Assessment & Plan Note (Signed)
 Encouraged diet and exercise with goal of losing 1-2lbs per week. Call with any concerns.

## 2024-04-30 NOTE — Assessment & Plan Note (Signed)
 Rechecking labs today. Await results. Treat as needed.

## 2024-04-30 NOTE — Assessment & Plan Note (Signed)
 Under good control on current regimen. Continue current regimen. Continue to monitor. Call with any concerns. Refills given. Labs drawn today.

## 2024-04-30 NOTE — Assessment & Plan Note (Signed)
Resolved. No issues.

## 2024-04-30 NOTE — Progress Notes (Signed)
 BP 134/86   Pulse 65   Ht 5' 7 (1.702 m)   Wt 252 lb 9.6 oz (114.6 kg)   SpO2 97%   BMI 39.56 kg/m    Subjective:    Patient ID: Derrick Jama Bluford Mickey., male    DOB: 26-May-1956, 68 y.o.   MRN: [id number]  HPI: Derrick Dimartino. is a 68 y.o. male  Chief Complaint  Patient presents with   Rash    Has subsided since seen Jolene 01/03/2024   Hypertension   Hyperlipidemia   HYPERTENSION / HYPERLIPIDEMIA Satisfied with current treatment? yes Duration of hypertension: chronic BP monitoring frequency: not checking BP medication side effects: no Past BP meds: lisinopril  Duration of hyperlipidemia: chronic Cholesterol medication side effects: no Cholesterol supplements: none Past cholesterol medications: none Medication compliance: excellent compliance Aspirin: yes Recent stressors: no Recurrent headaches: no Visual changes: no Palpitations: no Dyspnea: no Chest pain: no Lower extremity edema: no Dizzy/lightheaded: no  BPH BPH status: controlled Satisfied with current treatment?: yes Medication side effects: no Medication compliance: excellent compliance Duration: chronic Nocturia: 1/night Urinary frequency:no Incomplete voiding: yes Urgency: no Weak urinary stream: yes Straining to start stream: no Dysuria: no Onset: gradual Severity: mild  IRRITABILITY Duration: chronic Status:stable Anxious mood: no  Excessive worrying: no Irritability: no  Sweating: no Nausea: no Palpitations:no Hyperventilation: no Panic attacks: no Agoraphobia: no  Obscessions/compulsions: no Depressed mood: no    12/19/2023    8:08 AM 10/24/2023    8:48 AM 06/23/2023   10:32 AM 06/22/2023    8:56 AM 06/06/2023    3:32 PM  Depression screen PHQ 2/9  Decreased Interest 0 0 1 3 0  Down, Depressed, Hopeless 0 0 1 0 0  PHQ - 2 Score 0 0 2 3 0  Altered sleeping   0 0 0  Tired, decreased energy   0 2 1  Change in appetite   0 0 0  Feeling bad or failure about yourself    0 0 0   Trouble concentrating   0 0 0  Moving slowly or fidgety/restless   0 0 0  Suicidal thoughts   0 0 0  PHQ-9 Score   2 5 1   Difficult doing work/chores   Not difficult at all     Anhedonia: no Weight changes: no Insomnia: no   Hypersomnia: no Fatigue/loss of energy: no Feelings of worthlessness: no Feelings of guilt: no Impaired concentration/indecisiveness: no Suicidal ideations: no  Crying spells: no Recent Stressors/Life Changes: no   Relationship problems: no   Family stress: no     Financial stress: no    Job stress: no    Recent death/loss: no   Relevant past medical, surgical, family and social history reviewed and updated as indicated. Interim medical history since our last visit reviewed. Allergies and medications reviewed and updated.  Review of Systems  Constitutional: Negative.   Respiratory: Negative.    Cardiovascular: Negative.   Gastrointestinal: Negative.   Musculoskeletal: Negative.   Neurological: Negative.   Psychiatric/Behavioral: Negative.      Per HPI unless specifically indicated above     Objective:    BP 134/86   Pulse 65   Ht 5' 7 (1.702 m)   Wt 252 lb 9.6 oz (114.6 kg)   SpO2 97%   BMI 39.56 kg/m   Wt Readings from Last 3 Encounters:  04/30/24 252 lb 9.6 oz (114.6 kg)  01/03/24 261 lb 12.8 oz (118.8 kg)  01/02/24 259 lb (117.5  kg)    Physical Exam Vitals and nursing note reviewed.  Constitutional:      General: He is not in acute distress.    Appearance: Normal appearance. He is obese. He is not ill-appearing, toxic-appearing or diaphoretic.  HENT:     Head: Normocephalic and atraumatic.     Right Ear: External ear normal.     Left Ear: External ear normal.     Nose: Nose normal.     Mouth/Throat:     Mouth: Mucous membranes are moist.     Pharynx: Oropharynx is clear.  Eyes:     General: No scleral icterus.       Right eye: No discharge.        Left eye: No discharge.     Extraocular Movements: Extraocular movements  intact.     Conjunctiva/sclera: Conjunctivae normal.     Pupils: Pupils are equal, round, and reactive to light.  Cardiovascular:     Rate and Rhythm: Normal rate and regular rhythm.     Pulses: Normal pulses.     Heart sounds: Normal heart sounds. No murmur heard.    No friction rub. No gallop.  Pulmonary:     Effort: Pulmonary effort is normal. No respiratory distress.     Breath sounds: Normal breath sounds. No stridor. No wheezing, rhonchi or rales.  Chest:     Chest wall: No tenderness.  Musculoskeletal:        General: Normal range of motion.     Cervical back: Normal range of motion and neck supple.  Skin:    General: Skin is warm and dry.     Capillary Refill: Capillary refill takes less than 2 seconds.     Coloration: Skin is not jaundiced or pale.     Findings: No bruising, erythema, lesion or rash.  Neurological:     General: No focal deficit present.     Mental Status: He is alert and oriented to person, place, and time. Mental status is at baseline.  Psychiatric:        Mood and Affect: Mood normal.        Behavior: Behavior normal.        Thought Content: Thought content normal.        Judgment: Judgment normal.     Results for orders placed or performed in visit on 04/30/24  Bayer DCA Hb A1c Waived   Collection Time: 04/30/24  9:52 AM  Result Value Ref Range   HB A1C (BAYER DCA - WAIVED) 5.6 4.8 - 5.6 %      Assessment & Plan:   Problem List Items Addressed This Visit       Cardiovascular and Mediastinum   Benign hypertension   Under good control on current regimen. Continue current regimen. Continue to monitor. Call with any concerns. Refills given. Labs drawn today.        Relevant Medications   lisinopril  (ZESTRIL ) 20 MG tablet   Other Relevant Orders   CBC with Differential/Platelet   Comprehensive metabolic panel with GFR   Pfizer Comirnaty Covid -19 Vaccine 68yrs and older     Digestive   Fatty liver   Under good control on current  regimen. Continue current regimen. Continue to monitor. Call with any concerns. Refills given. Labs drawn today.         Musculoskeletal and Integument   Rash   Resolved. No issues.         Genitourinary   Benign prostatic hyperplasia with lower  urinary tract symptoms   Under good control on current regimen. Continue current regimen. Continue to monitor. Call with any concerns. Refills given. Labs drawn today.       Relevant Medications   tamsulosin  (FLOMAX ) 0.4 MG CAPS capsule   Other Relevant Orders   CBC with Differential/Platelet   Comprehensive metabolic panel with GFR     Other   Morbid obesity (HCC)   Encouraged diet and exercise with goal of losing 1-2lbs per week. Call with any concerns.       Relevant Orders   Bayer DCA Hb A1c Waived (Completed)   Hyperlipidemia - Primary   Rechecking labs today. Await results. Treat as needed.       Relevant Medications   lisinopril  (ZESTRIL ) 20 MG tablet   Other Relevant Orders   CBC with Differential/Platelet   Comprehensive metabolic panel with GFR   Lipid Panel w/o Chol/HDL Ratio   Vitamin D  deficiency   Under good control on current regimen. Continue current regimen. Continue to monitor. Call with any concerns. Refills given. Labs drawn today.       Relevant Orders   CBC with Differential/Platelet   Comprehensive metabolic panel with GFR   Irritability   Under good control on current regimen. Continue current regimen. Continue to monitor. Call with any concerns. Refills given. Labs drawn today.       Relevant Orders   CBC with Differential/Platelet   Comprehensive metabolic panel with GFR   Other Visit Diagnoses       Incomplete bladder emptying       Relevant Medications   tamsulosin  (FLOMAX ) 0.4 MG CAPS capsule   Other Relevant Orders   CBC with Differential/Platelet   Comprehensive metabolic panel with GFR     BPH with obstruction/lower urinary tract symptoms       Relevant Medications   tamsulosin   (FLOMAX ) 0.4 MG CAPS capsule   Other Relevant Orders   CBC with Differential/Platelet   Comprehensive metabolic panel with GFR   PSA     Need for COVID-19 vaccine       Flu shot given today.   Relevant Orders   Pfizer Comirnaty Covid -19 Vaccine 17yrs and older        Follow up plan: Return in about 6 months (around 10/28/2024) for physical.

## 2024-05-01 LAB — LIPID PANEL W/O CHOL/HDL RATIO
Cholesterol, Total: 135 mg/dL (ref 100–199)
HDL: 38 mg/dL — ABNORMAL LOW (ref >39)
LDL Chol Calc (NIH): 83 mg/dL (ref 0–99)
Triglycerides: 69 mg/dL (ref 0–149)
VLDL Cholesterol Cal: 14 mg/dL (ref 5–40)

## 2024-05-01 LAB — COMPREHENSIVE METABOLIC PANEL WITH GFR
ALT: 55 IU/L — ABNORMAL HIGH (ref 0–44)
AST: 63 IU/L — ABNORMAL HIGH (ref 0–40)
Albumin: 4.3 g/dL (ref 3.9–4.9)
Alkaline Phosphatase: 75 IU/L (ref 47–123)
BUN/Creatinine Ratio: 22 (ref 10–24)
BUN: 19 mg/dL (ref 8–27)
Bilirubin Total: 0.4 mg/dL (ref 0.0–1.2)
CO2: 21 mmol/L (ref 20–29)
Calcium: 9.3 mg/dL (ref 8.6–10.2)
Chloride: 105 mmol/L (ref 96–106)
Creatinine, Ser: 0.87 mg/dL (ref 0.76–1.27)
Globulin, Total: 2.3 g/dL (ref 1.5–4.5)
Glucose: 100 mg/dL — ABNORMAL HIGH (ref 70–99)
Potassium: 4.5 mmol/L (ref 3.5–5.2)
Sodium: 142 mmol/L (ref 134–144)
Total Protein: 6.6 g/dL (ref 6.0–8.5)
eGFR: 95 mL/min/1.73 (ref >59)

## 2024-05-01 LAB — CBC WITH DIFFERENTIAL/PLATELET
Basophils Absolute: 0 x10E3/uL (ref 0.0–0.2)
Basos: 1 %
EOS (ABSOLUTE): 0.2 x10E3/uL (ref 0.0–0.4)
Eos: 3 %
Hematocrit: 45.9 % (ref 37.5–51.0)
Hemoglobin: 15 g/dL (ref 13.0–17.7)
Immature Grans (Abs): 0 x10E3/uL (ref 0.0–0.1)
Immature Granulocytes: 0 %
Lymphocytes Absolute: 2.3 x10E3/uL (ref 0.7–3.1)
Lymphs: 32 %
MCH: 30.7 pg (ref 26.6–33.0)
MCHC: 32.7 g/dL (ref 31.5–35.7)
MCV: 94 fL (ref 79–97)
Monocytes Absolute: 0.7 x10E3/uL (ref 0.1–0.9)
Monocytes: 10 %
Neutrophils Absolute: 3.8 x10E3/uL (ref 1.4–7.0)
Neutrophils: 54 %
Platelets: 215 x10E3/uL (ref 150–450)
RBC: 4.88 x10E6/uL (ref 4.14–5.80)
RDW: 13.3 % (ref 11.6–15.4)
WBC: 7 x10E3/uL (ref 3.4–10.8)

## 2024-05-01 LAB — PSA: Prostate Specific Ag, Serum: 0.2 ng/mL (ref 0.0–4.0)

## 2024-05-02 ENCOUNTER — Ambulatory Visit: Payer: Self-pay | Admitting: Family Medicine

## 2024-05-04 DIAGNOSIS — R339 Retention of urine, unspecified: Secondary | ICD-10-CM | POA: Diagnosis not present

## 2024-05-10 ENCOUNTER — Ambulatory Visit: Payer: Self-pay

## 2024-05-10 ENCOUNTER — Ambulatory Visit

## 2024-05-10 NOTE — Telephone Encounter (Signed)
 FYI Only or Action Required?: FYI only for provider.  Patient was last seen in primary care on 04/30/2024 by [name], DO.  Called Nurse Triage reporting Nasal Congestion.  Symptoms began several days ago.  Interventions attempted: Nothing.  Symptoms are: gradually worsening.  Triage Disposition: See Physician Within 24 Hours  Patient/caregiver understands and will follow disposition?: Yes, alt provider at [facility] d/t no availability at PCP clinic.      Copied from CRM #[id number]. Topic: Clinical - Red Word Triage >> May 10, 2024  8:25 AM Joesph NOVAK wrote: Red Word that prompted transfer to Nurse Triage: Hoarse, body aches, congestion, coughing, Chills. Reason for Disposition  Earache  Answer Assessment - Initial Assessment Questions 1. LOCATION: Where does it hurt?      Congestion 2. ONSET: When did the sinus pain start?  (e.g., hours, days)      X 2 days, worsening yesterday 3. SEVERITY: How bad is the pain?   (Scale 0-10; or none, mild, moderate or severe)     Mild 5. NASAL CONGESTION: Is the nose blocked? If Yes, ask: Can you open it or must you breathe through your mouth?     No, able to breathe out of nose 6. NASAL DISCHARGE: Do you have discharge from your nose? If so ask, What color?     None, congestion 7. FEVER: Do you have a fever? If Yes, ask: What is it, how was it measured, and when did it start?      None 8. OTHER SYMPTOMS: Do you have any other symptoms? (e.g., sore throat, cough, earache, difficulty breathing)     Cough, hoarse, ST, ear congestion  Protocols used: Sinus Pain or Congestion-A-AH

## 2024-05-14 ENCOUNTER — Ambulatory Visit: Payer: Self-pay

## 2024-05-14 ENCOUNTER — Encounter: Payer: Self-pay | Admitting: Student

## 2024-05-14 ENCOUNTER — Ambulatory Visit (INDEPENDENT_AMBULATORY_CARE_PROVIDER_SITE_OTHER): Admitting: Student

## 2024-05-14 VITALS — BP 144/92 | HR 84 | Temp 97.8°F | Ht 67.0 in | Wt 253.0 lb

## 2024-05-14 DIAGNOSIS — J0100 Acute maxillary sinusitis, unspecified: Secondary | ICD-10-CM

## 2024-05-14 MED ORDER — AMOXICILLIN-POT CLAVULANATE 875-125 MG PO TABS: 1.0000 | ORAL_TABLET | Freq: Two times a day (BID) | ORAL | 0 refills | Status: AC

## 2024-05-14 NOTE — Telephone Encounter (Signed)
 FYI Only or Action Required?: Action required by provider: request for appointment.  Patient was last seen in primary care on 04/30/2024 by [name], DO.  Called Nurse Triage reporting Cough.  Symptoms began a week ago.  Interventions attempted: Nothing.  Symptoms are: gradually worsening.  Triage Disposition: See Physician Within 24 Hours  Patient/caregiver understands and will follow disposition?: Yes    Copied from CRM #[id number]. Topic: Clinical - Red Word Triage >> May 14, 2024  8:07 AM Frederich PARAS wrote: [facility] that prompted transfer to Nurse Triage: head and ear pain  pt calling, to schedule an apt, undr weather for about a week, comes out green, the mucus. head and ears, are stopped up, pain in head and ears. pt says the cough feels likes it is embedded in his chest and it will not beak up. Horse. Reason for Disposition  Earache is present  Answer Assessment - Initial Assessment Questions 1. ONSET: When did the cough begin?      1 week 2. SEVERITY: How bad is the cough today?      severe 3. SPUTUM: Describe the color of your sputum (e.g., none, dry cough; clear, white, yellow, green)     green 4. HEMOPTYSIS: Are you coughing up any blood? If Yes, ask: How much? (e.g., flecks, streaks, tablespoons, etc.)     no 5. DIFFICULTY BREATHING: Are you having difficulty breathing? If Yes, ask: How bad is it? (e.g., mild, moderate, severe)      no 6. FEVER: Do you have a fever? If Yes, ask: What is your temperature, how was it measured, and when did it start?     no 7. CARDIAC HISTORY: Do you have any history of heart disease? (e.g., heart attack, congestive heart failure)      no 8. LUNG HISTORY: Do you have any history of lung disease?  (e.g., pulmonary embolus, asthma, emphysema)     no 9. PE RISK FACTORS: Do you have a history of blood clots? (or: recent major surgery, recent prolonged travel, bedridden)     no 10. OTHER SYMPTOMS: Do you have  any other symptoms? (e.g., runny nose, wheezing, chest pain)       Ear pain 11. PREGNANCY: Is there any chance you are pregnant? When was your last menstrual period?       N/a 12. TRAVEL: Have you traveled out of the country in the last month? (e.g., travel history, exposures)       no  Protocols used: Cough - Acute Non-Productive-A-AH

## 2024-05-14 NOTE — Progress Notes (Signed)
 Established Patient Office Visit  Subjective   Patient ID: Derrick Sandoval., male    DOB: [birth year]  Age: 68 y.o. MRN: [id number]  Chief Complaint  Patient presents with   Cough   Ear Pain    Derrick Sandoval. with medical hx listed below presents today for chest congestion congestion starting 8 days ago. Also report sore throat, cry cough, ear pain, and headaches. Does have nasal congestion and is doing sinus rinses twice a day with green mucous. Also uses flonase  twice daily. Did have chills Tuesday night but then started to feel better. However yesterday noticed more sinus congestion and today woke up feeling mo congestion, body aches, and fatigue. He denies fever, dyspnea, abdominal, n/v/d.   Took home COVID test last week was normal. He has had COVID and flu vaccines this year. Has been around friend who has similar symptoms. He is taking allegra -D, cough syrup, cough, tylenol  and ibuprofen with only mild improvement.    Patient Active Problem List   Diagnosis Date Noted   Rash 12/19/2023   Constipation 04/25/2023   Irritability 10/13/2021   Recurrent sinusitis 09/24/2021   Cervical radiculopathy 01/16/2021   Cervical spondylosis 09/16/2020   Tension headache 09/18/2019   Genital herpes 01/23/2016   Vitamin D  deficiency 05/12/2015   GERD (gastroesophageal reflux disease) 01/29/2015   OA (osteoarthritis) of knee 01/29/2015   Fatty liver 01/28/2015   Left ventricular hypertrophy 01/28/2015   Morbid obesity (HCC) 01/28/2015   Rhinitis, allergic 01/28/2015   Elevated transaminase level 01/28/2015   Hyperlipidemia 01/28/2015   Benign hypertension 01/28/2015   Benign prostatic hyperplasia with lower urinary tract symptoms 01/28/2015   Past Medical History:  Diagnosis Date   Allergy    Arthritis    Cervical radiculopathy 01/16/2021   Cough    nonproductive no fever saw at dr 05-26-18   GERD (gastroesophageal reflux disease)    Hyperlipidemia    Hypertension     Meningitis due to viruses 1968      ROS Refer to HPI    Objective:     Outpatient Encounter Medications as of 05/14/2024  Medication Sig   acyclovir  (ZOVIRAX ) 400 MG tablet Take 1 tablet (400 mg total) by mouth 2 (two) times daily.   albuterol  (VENTOLIN  HFA) 108 (90 Base) MCG/ACT inhaler Inhale 1-2 puffs into the lungs every 6 (six) hours as needed for wheezing or shortness of breath.   amoxicillin -clavulanate (AUGMENTIN ) 875-125 MG tablet Take 1 tablet by mouth 2 (two) times daily for 5 days.   Ascorbic Acid (VITAMIN C) 1000 MG tablet Take 1,000 mg by mouth at bedtime.    aspirin EC 81 MG tablet Take 81 mg by mouth daily.   azelastine (ASTELIN) 0.1 % nasal spray SMARTSIG:1-2 Spray(s) Both Nares Twice Daily   Budeson-Glycopyrrol-Formoterol  (BREZTRI  AEROSPHERE) 160-9-4.8 MCG/ACT AERO Inhale 2 puffs into the lungs 2 (two) times daily.   cetirizine  (ZYRTEC ) 10 MG tablet Take 1 tablet (10 mg total) by mouth daily.   cholecalciferol (VITAMIN D ) 1000 UNITS tablet Take 1,000 Units by mouth daily.    diclofenac  Sodium (VOLTAREN ) 1 % GEL Apply 4 g topically 4 (four) times daily.   famotidine  (PEPCID ) 20 MG tablet Take 1 tablet (20 mg total) by mouth 2 (two) times daily.   fluticasone  (FLONASE ) 50 MCG/ACT nasal spray Place 2 sprays into both nostrils daily.   lisinopril  (ZESTRIL ) 20 MG tablet Take 1 tablet (20 mg total) by mouth daily.   methocarbamol  (ROBAXIN ) 500 MG tablet  Take 1 tablet (500 mg total) by mouth daily as needed.   montelukast  (SINGULAIR ) 10 MG tablet Take 1 tablet (10 mg total) by mouth at bedtime.   Multiple Vitamins-Minerals (MULTIVITAMIN WITH MINERALS) tablet Take 1 tablet by mouth daily.   mupirocin  ointment (BACTROBAN ) 2 % Apply 1 Application topically 2 (two) times daily.   naproxen  sodium (ALEVE ) 220 MG tablet Take 220 mg by mouth daily as needed.   nystatin  cream (MYCOSTATIN ) Apply 1 Application topically 2 (two) times daily.   omeprazole  (PRILOSEC) 40 MG capsule Take 1  capsule (40 mg total) by mouth daily.   sertraline  (ZOLOFT ) 100 MG tablet Take 1 tablet (100 mg total) by mouth daily.   tamsulosin  (FLOMAX ) 0.4 MG CAPS capsule Take 1 capsule (0.4 mg total) by mouth daily.   triamcinolone  ointment (KENALOG ) 0.1 % Apply 1 Application topically 2 (two) times daily.   No facility-administered encounter medications on file as of 05/14/2024.    BP (!) 144/92   Pulse 84   Temp 97.8 F (36.6 C) (Oral)   Ht 5' 7 (1.702 m)   Wt 253 lb (114.8 kg)   SpO2 98%   BMI 39.63 kg/m  BP Readings from Last 3 Encounters:  05/14/24 (!) 144/92  04/30/24 134/86  01/31/24 (!) 164/91    Physical Exam Constitutional:      Appearance: Normal appearance.  HENT:     Right Ear: Tympanic membrane normal.     Left Ear: Tympanic membrane normal.     Nose: Congestion and rhinorrhea present.     Right Sinus: Maxillary sinus tenderness present.     Left Sinus: Maxillary sinus tenderness present.     Mouth/Throat:     Mouth: Mucous membranes are moist.     Pharynx: Oropharynx is clear. No oropharyngeal exudate or posterior oropharyngeal erythema.  Eyes:     Extraocular Movements: Extraocular movements intact.     Conjunctiva/sclera: Conjunctivae normal.     Pupils: Pupils are equal, round, and reactive to light.  Cardiovascular:     Rate and Rhythm: Normal rate and regular rhythm.  Pulmonary:     Effort: Pulmonary effort is normal.     Breath sounds: Normal breath sounds. No wheezing, rhonchi or rales.  Abdominal:     General: Abdomen is flat. Bowel sounds are normal. There is no distension.     Palpations: Abdomen is soft.     Tenderness: There is no abdominal tenderness.  Musculoskeletal:        General: Normal range of motion.     Right lower leg: No edema.     Left lower leg: No edema.  Lymphadenopathy:     Cervical: No cervical adenopathy.  Skin:    General: Skin is warm and dry.     Capillary Refill: Capillary refill takes less than 2 seconds.   Neurological:     General: No focal deficit present.     Mental Status: He is alert and oriented to person, place, and time.  Psychiatric:        Mood and Affect: Mood normal.        Behavior: Behavior normal.        05/14/2024   10:04 AM 12/19/2023    8:08 AM 10/24/2023    8:48 AM  Depression screen PHQ 2/9  Decreased Interest 0 0 0  Down, Depressed, Hopeless 0 0 0  PHQ - 2 Score 0 0 0       05/14/2024   10:04 AM 12/19/2023  8:08 AM 10/24/2023    8:48 AM 06/22/2023    8:56 AM  GAD 7 : Generalized Anxiety Score  Nervous, Anxious, on Edge 0 0 0 0  Control/stop worrying 0 0 0 0  Worry too much - different things  0 0 0  Trouble relaxing  0 0 0  Restless  0 0 0  Easily annoyed or irritable  0 0 0  Afraid - awful might happen  0 0 0  Total GAD 7 Score  0 0 0    Assessment & Plan:  Acute maxillary sinusitis, recurrence not specified Worsening sinus congestion with green mucous after 8 days. Afebrile with stable vitals. Lungs are clear on auscultation. Given length of symptoms and acute worsening today will trial Augmentin  for 5 days. Continue symptomatic management with tylenol /ibuprofen, nasal irrigation, antihistamine, nasal corticosteroids and Guaifenesin .  Other orders -     Amoxicillin -Pot Clavulanate; Take 1 tablet by mouth 2 (two) times daily for 5 days.  Dispense: 10 tablet; Refill: 0   Return if symptoms worsen or fail to improve.    Harlene Saddler, MD

## 2024-05-14 NOTE — Telephone Encounter (Signed)
 Noted

## 2024-05-25 ENCOUNTER — Encounter: Payer: Self-pay | Admitting: Family Medicine

## 2024-05-28 ENCOUNTER — Ambulatory Visit: Payer: Self-pay

## 2024-05-28 NOTE — Telephone Encounter (Signed)
 Scheduled

## 2024-05-28 NOTE — Telephone Encounter (Signed)
 Routing to provider to advise. Patient seen a month ago. Do you want to work the patient in for another appointment?

## 2024-05-28 NOTE — Telephone Encounter (Signed)
 appt

## 2024-05-28 NOTE — Telephone Encounter (Signed)
 FYI Only or Action Required?: Action required by provider: request for appointment.  Patient was last seen in primary care on 05/14/2024 by Lemon Raisin, MD.  Called Nurse Triage reporting Ear Pain.  Symptoms began several weeks ago.  Interventions attempted: OTC medications: Tylenol , Motrin and Prescription medications: Augmentin .  Symptoms are: gradually worsening.  Triage Disposition: See HCP Within 4 Hours (Or PCP Triage)  Patient/caregiver understands and will follow disposition?: No, wishes to speak with PCP, pt declines UC, alt clinic, requesting to see PCP or alt in home office ONLY       Copied from CRM [id number]. Topic: Clinical - Red Word Triage >> May 28, 2024 10:20 AM Selinda RAMAN wrote: Red Word that prompted transfer to Nurse Triage: The patient called in stating he was seen a few weeks back and prescribed amoxicillin -clavulanate (AUGMENTIN ) 875-125 MG table. He says he hasn't gotten any better and is still complaining of headache, congestion and ear pain. I will transfer him to E2C2 NT Reason for Disposition  [1] SEVERE sinus pain (e.g., excruciating) AND [2] not improved 2 hours after pain medicine  Answer Assessment - Initial Assessment Questions 1. ANTIBIOTIC: What antibiotic are you taking? How many times a day?     Augmentin  2. ONSET: When was the antibiotic started?     05/14/24 3. PAIN: How bad is the pain?   (Scale 0-10; or none, mild, moderate or severe)     Severe 4. FEVER: Do you have a fever? If Yes, ask: What is it, how was it measured, and when did it start?      None 5. SYMPTOMS: Are there any other symptoms you're concerned about? If Yes, ask: When did it start?     Nasal congestion, ear pain, ringing, headache  Protocols used: Sinus Infection on Antibiotic Follow-up Call-A-AH

## 2024-06-01 ENCOUNTER — Encounter: Payer: Self-pay | Admitting: Family Medicine

## 2024-06-01 ENCOUNTER — Ambulatory Visit (INDEPENDENT_AMBULATORY_CARE_PROVIDER_SITE_OTHER): Admitting: Family Medicine

## 2024-06-01 VITALS — BP 152/80 | HR 66 | Temp 97.4°F | Ht 67.0 in | Wt 259.2 lb

## 2024-06-01 DIAGNOSIS — J329 Chronic sinusitis, unspecified: Secondary | ICD-10-CM

## 2024-06-01 MED ORDER — DOXYCYCLINE HYCLATE 100 MG PO TABS: 100.0000 mg | ORAL_TABLET | Freq: Two times a day (BID) | ORAL | 0 refills | Status: DC

## 2024-06-01 MED ORDER — PREDNISONE 50 MG PO TABS: 50.0000 mg | ORAL_TABLET | Freq: Every day | ORAL | 0 refills | Status: DC

## 2024-06-01 MED ORDER — TRIAMCINOLONE ACETONIDE 40 MG/ML IJ SUSP: 40.0000 mg | Freq: Once | INTRAMUSCULAR | Status: AC

## 2024-06-01 NOTE — Assessment & Plan Note (Signed)
 Will treat with steroids and doxycycline . Call if not getting better or getting worse.

## 2024-06-01 NOTE — Progress Notes (Signed)
 BP (!) 152/80   Pulse 66   Temp (!) 97.4 F (36.3 C) (Oral)   Ht 5' 7 (1.702 m)   Wt 259 lb 3.2 oz (117.6 kg)   SpO2 96%   BMI 40.60 kg/m    Subjective:    Patient ID: Derrick Jama Bluford Mickey., male    DOB: 01-Sep-1955, 68 y.o.   MRN: [id number]  HPI: Derrick Gorin. is a 68 y.o. male  No chief complaint on file.  UPPER RESPIRATORY TRACT INFECTION Duration: about a month Worst symptom:ear pain, drainage, congestion Fever: no Cough: yes Shortness of breath: yes Wheezing: no Chest pain: no Chest tightness: no Chest congestion: yes Nasal congestion: yes Runny nose: yes Post nasal drip: yes Sneezing: yes Sore throat: yes Swollen glands: no Sinus pressure: yes Headache: yes Face pain: yes Toothache: yes Ear pain: yes bilateral Ear pressure: yes bilateral Eyes red/itching:no Eye drainage/crusting: no  Vomiting: no Rash: no Fatigue: yes Sick contacts: no Strep contacts: no  Context: stable Recurrent sinusitis: yes Relief with OTC cold/cough medications: no  Treatments attempted: augmentin    Relevant past medical, surgical, family and social history reviewed and updated as indicated. Interim medical history since our last visit reviewed. Allergies and medications reviewed and updated.  Review of Systems  Constitutional: Negative.   HENT:  Positive for congestion, postnasal drip, rhinorrhea, sinus pressure, sinus pain, sneezing and sore throat. Negative for dental problem, drooling, ear discharge, ear pain, facial swelling, hearing loss, mouth sores, nosebleeds, tinnitus, trouble swallowing and voice change.   Eyes: Negative.   Respiratory: Negative.    Cardiovascular: Negative.   Psychiatric/Behavioral: Negative.      Per HPI unless specifically indicated above     Objective:    BP (!) 152/80   Pulse 66   Temp (!) 97.4 F (36.3 C) (Oral)   Ht 5' 7 (1.702 m)   Wt 259 lb 3.2 oz (117.6 kg)   SpO2 96%   BMI 40.60 kg/m   Wt Readings from Last 3  Encounters:  06/01/24 259 lb 3.2 oz (117.6 kg)  05/14/24 253 lb (114.8 kg)  04/30/24 252 lb 9.6 oz (114.6 kg)    Physical Exam Vitals and nursing note reviewed.  Constitutional:      General: He is not in acute distress.    Appearance: Normal appearance. He is obese. He is not ill-appearing, toxic-appearing or diaphoretic.  HENT:     Head: Normocephalic and atraumatic.     Right Ear: Tympanic membrane and external ear normal.     Left Ear: Tympanic membrane, ear canal and external ear normal.     Nose: Congestion and rhinorrhea present.     Mouth/Throat:     Mouth: Mucous membranes are moist.     Pharynx: Oropharynx is clear. No oropharyngeal exudate or posterior oropharyngeal erythema.  Eyes:     General: No scleral icterus.       Right eye: No discharge.        Left eye: No discharge.     Extraocular Movements: Extraocular movements intact.     Conjunctiva/sclera: Conjunctivae normal.     Pupils: Pupils are equal, round, and reactive to light.  Cardiovascular:     Rate and Rhythm: Normal rate and regular rhythm.     Pulses: Normal pulses.     Heart sounds: Normal heart sounds. No murmur heard.    No friction rub. No gallop.  Pulmonary:     Effort: Pulmonary effort is normal. No respiratory distress.  Breath sounds: Normal breath sounds. No stridor. No wheezing, rhonchi or rales.  Chest:     Chest wall: No tenderness.  Musculoskeletal:        General: Normal range of motion.     Cervical back: Normal range of motion and neck supple.  Lymphadenopathy:     Cervical: Cervical adenopathy present.  Skin:    General: Skin is warm and dry.     Capillary Refill: Capillary refill takes less than 2 seconds.     Coloration: Skin is not jaundiced or pale.     Findings: No bruising, erythema, lesion or rash.  Neurological:     General: No focal deficit present.     Mental Status: He is alert and oriented to person, place, and time. Mental status is at baseline.  Psychiatric:         Mood and Affect: Mood normal.        Behavior: Behavior normal.        Thought Content: Thought content normal.        Judgment: Judgment normal.     Results for orders placed or performed in visit on 04/30/24  Bayer DCA Hb A1c Waived   Collection Time: 04/30/24  9:52 AM  Result Value Ref Range   HB A1C (BAYER DCA - WAIVED) 5.6 4.8 - 5.6 %  CBC with Differential/Platelet   Collection Time: 04/30/24  9:54 AM  Result Value Ref Range   WBC 7.0 3.4 - 10.8 x10E3/uL   RBC 4.88 4.14 - 5.80 x10E6/uL   Hemoglobin 15.0 13.0 - 17.7 g/dL   Hematocrit 54.0 62.4 - 51.0 %   MCV 94 79 - 97 fL   MCH 30.7 26.6 - 33.0 pg   MCHC 32.7 31.5 - 35.7 g/dL   RDW 86.6 88.3 - 84.5 %   Platelets 215 150 - 450 x10E3/uL   Neutrophils 54 Not Estab. %   Lymphs 32 Not Estab. %   Monocytes 10 Not Estab. %   Eos 3 Not Estab. %   Basos 1 Not Estab. %   Neutrophils Absolute 3.8 1.4 - 7.0 x10E3/uL   Lymphocytes Absolute 2.3 0.7 - 3.1 x10E3/uL   Monocytes Absolute 0.7 0.1 - 0.9 x10E3/uL   EOS (ABSOLUTE) 0.2 0.0 - 0.4 x10E3/uL   Basophils Absolute 0.0 0.0 - 0.2 x10E3/uL   Immature Granulocytes 0 Not Estab. %   Immature Grans (Abs) 0.0 0.0 - 0.1 x10E3/uL  Comprehensive metabolic panel with GFR   Collection Time: 04/30/24  9:54 AM  Result Value Ref Range   Glucose 100 (H) 70 - 99 mg/dL   BUN 19 8 - 27 mg/dL   Creatinine, Ser 9.12 0.76 - 1.27 mg/dL   eGFR 95 >40 fO/fpw/8.26   BUN/Creatinine Ratio 22 10 - 24   Sodium 142 134 - 144 mmol/L   Potassium 4.5 3.5 - 5.2 mmol/L   Chloride 105 96 - 106 mmol/L   CO2 21 20 - 29 mmol/L   Calcium 9.3 8.6 - 10.2 mg/dL   Total Protein 6.6 6.0 - 8.5 g/dL   Albumin 4.3 3.9 - 4.9 g/dL   Globulin, Total 2.3 1.5 - 4.5 g/dL   Bilirubin Total 0.4 0.0 - 1.2 mg/dL   Alkaline Phosphatase 75 47 - 123 IU/L   AST 63 (H) 0 - 40 IU/L   ALT 55 (H) 0 - 44 IU/L  Lipid Panel w/o Chol/HDL Ratio   Collection Time: 04/30/24  9:54 AM  Result Value Ref Range   Cholesterol,  Total 135  100 - 199 mg/dL   Triglycerides 69 0 - 149 mg/dL   HDL 38 (L) >60 mg/dL   VLDL Cholesterol Cal 14 5 - 40 mg/dL   LDL Chol Calc (NIH) 83 0 - 99 mg/dL  PSA   Collection Time: 04/30/24  9:54 AM  Result Value Ref Range   Prostate Specific Ag, Serum 0.2 0.0 - 4.0 ng/mL      Assessment & Plan:   Problem List Items Addressed This Visit       Respiratory   Recurrent sinusitis - Primary   Will treat with steroids and doxycycline . Call if not getting better or getting worse.       Relevant Medications   triamcinolone  acetonide (KENALOG -40) injection 40 mg   predniSONE  (DELTASONE ) 50 MG tablet   doxycycline  (VIBRA -TABS) 100 MG tablet     Follow up plan: Return for As scheduled.

## 2024-07-03 ENCOUNTER — Encounter: Payer: Self-pay | Admitting: Urology

## 2024-07-05 ENCOUNTER — Ambulatory Visit (INDEPENDENT_AMBULATORY_CARE_PROVIDER_SITE_OTHER): Payer: Self-pay | Admitting: Emergency Medicine

## 2024-07-05 VITALS — Ht 67.0 in | Wt 260.0 lb

## 2024-07-05 DIAGNOSIS — Z0000 Encounter for general adult medical examination without abnormal findings: Secondary | ICD-10-CM

## 2024-07-05 NOTE — Patient Instructions (Signed)
 Derrick Sandoval,  Thank you for taking the time for your Medicare Wellness Visit. I appreciate your continued commitment to your health goals. Please review the care plan we discussed, and feel free to reach out if I can assist you further.  Please note that Annual Wellness Visits do not include a physical exam. Some assessments may be limited, especially if the visit was conducted virtually. If needed, we may recommend an in-person follow-up with your provider.  Ongoing Care Seeing your primary care provider every 3 to 6 months helps us  monitor your health and provide consistent, personalized care.   Referrals If a referral was made during today's visit and you haven't received any updates within two weeks, please contact the referred provider directly to check on the status.  Recommended Screenings: Recommend getting a colonoscopy.  Health Maintenance  Topic Date Due   Colon Cancer Screening  05/13/2023   Medicare Annual Wellness Visit  06/22/2024   COVID-19 Vaccine (9 - 2025-26 season) 10/28/2024   DTaP/Tdap/Td vaccine (4 - Td or Tdap) 10/09/2027   Pneumococcal Vaccine for age over 51  Completed   Flu Shot  Completed   Hepatitis C Screening  Completed   Zoster (Shingles) Vaccine  Completed   Meningitis B Vaccine  Aged Out       07/05/2024   10:07 AM  Advanced Directives  Does Patient Have a Medical Advance Directive? No  Would patient like information on creating a medical advance directive? No - Patient declined    Vision: Annual vision screenings are recommended for early detection of glaucoma, cataracts, and diabetic retinopathy. These exams can also reveal signs of chronic conditions such as diabetes and high blood pressure.  Dental: Annual dental screenings help detect early signs of oral cancer, gum disease, and other conditions linked to overall health, including heart disease and diabetes.  Please see the attached documents for additional preventive care recommendations.

## 2024-07-05 NOTE — Progress Notes (Signed)
 Chief Complaint  Patient presents with   Medicare Wellness     Subjective:   Derrick Sandiford. is a 68 y.o. male who presents for a Medicare Annual Wellness Visit.  Allergies (verified) Patient has no known allergies.   History: Past Medical History:  Diagnosis Date   Allergy    Arthritis    Cervical radiculopathy 01/16/2021   Cough    nonproductive no fever saw at dr 05-26-18   GERD (gastroesophageal reflux disease)    Hyperlipidemia    Hypertension    Meningitis due to viruses 1968   Past Surgical History:  Procedure Laterality Date   COLONOSCOPY WITH PROPOFOL  N/A 05/12/2018   Procedure: COLONOSCOPY WITH PROPOFOL ;  Surgeon: Therisa Bi, MD;  Location: [facility] ENDOSCOPY;  Service: Gastroenterology;  Laterality: N/A;   ESOPHAGOGASTRODUODENOSCOPY (EGD) WITH PROPOFOL  N/A 05/12/2018   Procedure: ESOPHAGOGASTRODUODENOSCOPY (EGD) WITH PROPOFOL ;  Surgeon: Therisa Bi, MD;  Location: [facility] ENDOSCOPY;  Service: Gastroenterology;  Laterality: N/A;   ESOPHAGOGASTRODUODENOSCOPY (EGD) WITH PROPOFOL  N/A 06/01/2018   Procedure: ESOPHAGOGASTRODUODENOSCOPY (EGD) WITH PROPOFOL ;  Surgeon: [name], MD;  Location: [facility] ENDOSCOPY;  Service: Endoscopy;  Laterality: N/A;   EUS N/A 06/01/2018   Procedure: UPPER ENDOSCOPIC ULTRASOUND (EUS) RADIAL;  Surgeon: [name], MD;  Location: [facility] ENDOSCOPY;  Service: Endoscopy;  Laterality: N/A;   JOINT REPLACEMENT Bilateral    NASAL SINUS SURGERY     TOTAL KNEE ARTHROPLASTY     Family History  Problem Relation Age of Onset   Breast cancer Mother    Cancer Mother    Cancer Father        unsure, passed away before it was confirmed   Pancreatic cancer Daughter    Diabetes Neg Hx    Heart disease Neg Hx    Hypertension Neg Hx    Stroke Neg Hx    COPD Neg Hx    Colon cancer Neg Hx    Social History   Occupational History   Occupation: disabled   Tobacco Use   Smoking status: Never    Passive exposure: Never   Smokeless tobacco: Never   Vaping Use   Vaping status: Never Used  Substance and Sexual Activity   Alcohol use: Yes    Comment: occasionally, twice a year    Drug use: No   Sexual activity: Not Currently   Tobacco Counseling Counseling given: Not Answered  SDOH Screenings   Food Insecurity: Patient Declined (07/05/2024)  Recent Concern: Food Insecurity - Food Insecurity Present (04/26/2024)  Housing: Low Risk  (07/05/2024)  Transportation Needs: No Transportation Needs (07/05/2024)  Utilities: Not At Risk (07/05/2024)  Alcohol Screen: Low Risk  (04/26/2024)  Depression (PHQ2-9): Medium Risk (07/05/2024)  Financial Resource Strain: Medium Risk (07/03/2024)  Physical Activity: Insufficiently Active (07/05/2024)  Social Connections: Moderately Isolated (07/05/2024)  Stress: Patient Declined (07/05/2024)  Tobacco Use: Low Risk  (07/05/2024)  Health Literacy: Adequate Health Literacy (07/05/2024)   See flowsheets for full screening details  Depression Screen PHQ 2 & 9 Depression Scale- Over the past 2 weeks, how often have you been bothered by any of the following problems? Little interest or pleasure in doing things: 1 Feeling down, depressed, or hopeless (PHQ Adolescent also includes...irritable): 0 PHQ-2 Total Score: 1 Trouble falling or staying asleep, or sleeping too much: 0 Feeling tired or having little energy: 2 Poor appetite or overeating (PHQ Adolescent also includes...weight loss): 0 Feeling bad about yourself - or that you are a failure or have let yourself or your  family down: 0 Trouble concentrating on things, such as reading the newspaper or watching television [facility] Adolescent also includes...like school work): 3 Moving or speaking so slowly that other people could have noticed. Or the opposite - being so fidgety or restless that you have been moving around a lot more than usual: 0 Thoughts that you would be better off dead, or of hurting yourself in some way: 0 PHQ-9 Total Score: 6 If you  checked off any problems, how difficult have these problems made it for you to do your work, take care of things at home, or get along with other people?: Not difficult at all  Depression Treatment Depression Interventions/Treatment : Currently on Treatment     Goals Addressed               This Visit's Progress     Get back in the gym and live another year (pt-stated)         Visit info / Clinical Intake: Medicare Wellness Visit Type:: Subsequent Annual Wellness Visit Persons participating in visit:: patient Medicare Wellness Visit Mode:: Telephone If telephone:: video error Because this visit was a virtual/telehealth visit:: pt reported vitals If Telephone or Video please confirm:: I connected with the patient using audio enabled telemedicine application and verified that I am speaking with the correct person using two identifiers; I discussed the limitations of evaluation and management by telemedicine; The patient expressed understanding and agreed to proceed Patient Location:: home Provider Location:: office/clinic Information given by:: patient Interpreter Needed?: No Pre-visit prep was completed: yes AWV questionnaire completed by patient prior to visit?: yes Date:: 07/03/24 Living arrangements:: (!) lives alone Patient's Overall Health Status Rating: good Typical amount of pain: none Does pain affect daily life?: no Are you currently prescribed opioids?: no  Dietary Habits and Nutritional Risks How many meals a day?: 3 Eats fruit and vegetables daily?: yes Most meals are obtained by: preparing own meals In the last 2 weeks, have you had any of the following?: none Diabetic:: no  Functional Status Activities of Daily Living (to include ambulation/medication): Independent Ambulation: Independent with device- listed below Home Assistive Devices/Equipment: Eyeglasses Medication Administration: Independent Home Management: Independent Manage your own finances?:  yes Primary transportation is: driving Concerns about vision?: no *vision screening is required for WTM* Concerns about hearing?: no  Fall Screening Falls in the past year?: 0 Number of falls in past year: 0 Was there an injury with Fall?: 0 Fall Risk Category Calculator: 0 Patient Fall Risk Level: Low Fall Risk  Fall Risk Patient at Risk for Falls Due to: No Fall Risks Fall risk Follow up: Falls evaluation completed  Home and Transportation Safety: All rugs have non-skid backing?: yes All stairs or steps have railings?: (!) no (2 steps without a handrail) Grab bars in the bathtub or shower?: (!) no Have non-skid surface in bathtub or shower?: (!) no Good home lighting?: yes Regular seat belt use?: yes Hospital stays in the last year:: no  Cognitive Assessment Difficulty concentrating, remembering, or making decisions? : yes Will 6CIT or Mini Cog be Completed: yes What year is it?: 0 points What month is it?: 0 points Give patient an address phrase to remember (5 components): [address] [place] About what time is it?: 0 points Count backwards from 20 to 1: 0 points Say the months of the year in reverse: 2 points Repeat the address phrase from earlier: 0 points 6 CIT Score: 2 points  Advance Directives (For Healthcare) Does Patient  Have a Medical Advance Directive?: No Would patient like information on creating a medical advance directive?: No - Patient declined  Reviewed/Updated  Reviewed/Updated: Reviewed All (Medical, Surgical, Family, Medications, Allergies, Care Teams, Patient Goals)        Objective:    Today's Vitals   07/05/24 1001  Weight: 260 lb (117.9 kg)  Height: 5' 7 (1.702 m)   Body mass index is 40.72 kg/m.  Current Medications (verified) Outpatient Encounter Medications as of 07/05/2024  Medication Sig   acyclovir  (ZOVIRAX ) 400 MG tablet Take 1 tablet (400 mg total) by mouth 2 (two) times daily.   albuterol  (VENTOLIN  HFA) 108 (90 Base)  MCG/ACT inhaler Inhale 1-2 puffs into the lungs every 6 (six) hours as needed for wheezing or shortness of breath.   Ascorbic Acid (VITAMIN C) 1000 MG tablet Take 1,000 mg by mouth at bedtime.    aspirin EC 81 MG tablet Take 81 mg by mouth daily.   azelastine (ASTELIN) 0.1 % nasal spray SMARTSIG:1-2 Spray(s) Both Nares Twice Daily (Patient taking differently: Taking PRN)   Budeson-Glycopyrrol-Formoterol  (BREZTRI  AEROSPHERE) 160-9-4.8 MCG/ACT AERO Inhale 2 puffs into the lungs 2 (two) times daily. (Patient taking differently: Inhale 2 puffs into the lungs 2 (two) times daily. Taking PRN)   cetirizine  (ZYRTEC ) 10 MG tablet Take 1 tablet (10 mg total) by mouth daily.   cholecalciferol (VITAMIN D ) 1000 UNITS tablet Take 1,000 Units by mouth daily.    diclofenac  Sodium (VOLTAREN ) 1 % GEL Apply 4 g topically 4 (four) times daily. (Patient taking differently: Apply 4 g topically 4 (four) times daily. Taking PRN)   famotidine  (PEPCID ) 20 MG tablet Take 1 tablet (20 mg total) by mouth 2 (two) times daily.   fluticasone  (FLONASE ) 50 MCG/ACT nasal spray Place 2 sprays into both nostrils daily.   lisinopril  (ZESTRIL ) 20 MG tablet Take 1 tablet (20 mg total) by mouth daily.   methocarbamol  (ROBAXIN ) 500 MG tablet Take 1 tablet (500 mg total) by mouth daily as needed.   montelukast  (SINGULAIR ) 10 MG tablet Take 1 tablet (10 mg total) by mouth at bedtime.   Multiple Vitamins-Minerals (MULTIVITAMIN WITH MINERALS) tablet Take 1 tablet by mouth daily.   mupirocin  ointment (BACTROBAN ) 2 % Apply 1 Application topically 2 (two) times daily. (Patient taking differently: Apply 1 Application topically 2 (two) times daily. PRN)   naproxen  sodium (ALEVE ) 220 MG tablet Take 220 mg by mouth daily as needed.   nystatin  cream (MYCOSTATIN ) Apply 1 Application topically 2 (two) times daily. (Patient taking differently: Apply 1 Application topically 2 (two) times daily. Taking PRN)   omeprazole  (PRILOSEC) 40 MG capsule Take 1  capsule (40 mg total) by mouth daily.   sertraline  (ZOLOFT ) 100 MG tablet Take 1 tablet (100 mg total) by mouth daily. (Patient taking differently: Take 100 mg by mouth daily. Taking PRN)   tamsulosin  (FLOMAX ) 0.4 MG CAPS capsule Take 1 capsule (0.4 mg total) by mouth daily.   triamcinolone  ointment (KENALOG ) 0.1 % Apply 1 Application topically 2 (two) times daily. (Patient taking differently: Apply 1 Application topically 2 (two) times daily. Taking PRN)   doxycycline  (VIBRA -TABS) 100 MG tablet Take 1 tablet (100 mg total) by mouth 2 (two) times daily. (Patient not taking: Reported on 07/05/2024)   predniSONE  (DELTASONE ) 50 MG tablet Take 1 tablet (50 mg total) by mouth daily with breakfast. (Patient not taking: Reported on 07/05/2024)   Facility-Administered Encounter Medications as of 07/05/2024  Medication   triamcinolone  acetonide (KENALOG -40) injection 40 mg  Hearing/Vision screen Hearing Screening - Comments:: Denies hearing loss  Vision Screening - Comments:: UTD per patient. Uses whichever doctor is the cheapest. Immunizations and Health Maintenance Health Maintenance  Topic Date Due   Colonoscopy  05/13/2023   COVID-19 Vaccine (9 - 2025-26 season) 10/28/2024   Medicare Annual Wellness (AWV)  07/05/2025   DTaP/Tdap/Td (4 - Td or Tdap) 10/09/2027   Pneumococcal Vaccine: 50+ Years  Completed   Influenza Vaccine  Completed   Hepatitis C Screening  Completed   Zoster Vaccines- Shingrix  Completed   Meningococcal B Vaccine  Aged Out        Assessment/Plan:  This is a routine wellness examination for Derrick Sandoval.  Patient Care Team: [name], DO as PCP - General (Family Medicine) Deanna Channing LABOR, [facility] (Pharmacist) Helon Clotilda LABOR DEVONNA as Physician Assistant (Urology) Blair Mt, MD as Referring Physician (Otolaryngology)  I have personally reviewed and noted the following in the patient's chart:   Medical and social history Use of alcohol, tobacco or illicit  drugs  Current medications and supplements including opioid prescriptions. Functional ability and status Nutritional status Physical activity Advanced directives List of other physicians Hospitalizations, surgeries, and ER visits in previous 12 months Vitals Screenings to include cognitive, depression, and falls Referrals and appointments  No orders of the defined types were placed in this encounter.  In addition, I have reviewed and discussed with patient certain preventive protocols, quality metrics, and best practice recommendations. A written personalized care plan for preventive services as well as general preventive health recommendations were provided to patient.   Vina Ned, CMA   07/05/2024   Return in 1 year (on 07/09/2025).  After Visit Summary: (MyChart) Due to this being a telephonic visit, the after visit summary with patients personalized plan was offered to patient via MyChart   Nurse Notes:  6 CIT Score - 2 Declined colonoscopy (was due 04/2023)

## 2024-07-14 ENCOUNTER — Other Ambulatory Visit: Payer: Self-pay | Admitting: Family Medicine

## 2024-07-17 ENCOUNTER — Encounter: Payer: Self-pay | Admitting: Family Medicine

## 2024-07-18 MED ORDER — AMOXICILLIN 500 MG PO CAPS: 2000.0000 mg | ORAL_CAPSULE | Freq: Once | ORAL | 3 refills | Status: AC

## 2024-07-18 NOTE — Telephone Encounter (Signed)
 Requested medication (s) are due for refill today - provider review   Requested medication (s) are on the active medication list -no  Future visit scheduled -yes  Last refill: 10/24/23  Notes to clinic: off protocol- provider review , uses pre dental procedure  Requested Prescriptions  Pending Prescriptions Disp Refills   amoxicillin  (AMOXIL ) 500 MG capsule [Pharmacy Med Name: Amoxicillin  500 MG Oral Capsule] 4 capsule 0    Sig: TAKE FOUR CAPSULES BY MOUTH ONE HOUR BEFORE DENTAL PROCEDURE AS  DIRECTED     Off-Protocol Failed - 07/18/2024 12:26 PM      Failed - Medication not assigned to a protocol, review manually.      Passed - Valid encounter within last 12 months    Recent Outpatient Visits           1 month ago Recurrent sinusitis   [place] [facility] [place], [place] P, DO   2 months ago Acute maxillary sinusitis, recurrence not specified   [place] [facility], MD   2 months ago Mixed hyperlipidemia   [place] [facility] [place], Megan P, DO   6 months ago Rash   [place] [facility] [place], Melanie T, NP   7 months ago Skin infection   [place] [facility] [place], Melanie DASEN, NP       Future Appointments             In 2 weeks McGowan, Clotilda DELENA RIGGERS [facility] [place]               Requested Prescriptions  Pending Prescriptions Disp Refills   amoxicillin  (AMOXIL ) 500 MG capsule [Pharmacy Med Name: Amoxicillin  500 MG Oral Capsule] 4 capsule 0    Sig: TAKE FOUR CAPSULES BY MOUTH ONE HOUR BEFORE DENTAL PROCEDURE AS  DIRECTED     Off-Protocol Failed - 07/18/2024 12:26 PM      Failed - Medication not assigned to a protocol, review manually.      Passed - Valid encounter within last 12 months    Recent Outpatient Visits           1 month ago Recurrent sinusitis   [facility] [place], [place] P, DO    2 months ago Acute maxillary sinusitis, recurrence not specified   [place] [facility], MD   2 months ago Mixed hyperlipidemia   [place] [facility] [place], Megan P, DO   6 months ago Rash   [place] [facility] [place], Melanie T, NP   7 months ago Skin infection   [place] [facility] [place], Melanie DASEN, NP       Future Appointments             In 2 weeks McGowan, Clotilda DELENA RIGGERS [facility]

## 2024-07-30 NOTE — Progress Notes (Deleted)
 07/30/2024 11:36 AM   Derrick Sandoval. 1955-09-14 [id number]  Referring provider:  [name], DO [address] [place],  [place] [address]  Urological history: 1. Incomplete bladder emptying -TUIP w/ Dr. Ike (2015) -UDS (2019) incomplete bladder emptying with wide fill of porstatic urethra  -recommended to have an Interstim placed by Dr. Loran, deferred -currently cathing four times daily   2. BPH with LU TS -PSA (10/2023) 0.2 -cysto (2019) - large TURP defect noted.  Wide open prostate fossa.  -cysto (2022) - small amount of prostatic anterior tissue  -tamsulosin  0.4 mg daily and CIC  3. Orchitis and epididymitis -had episode in 2021   4.  Asymptomatic bacteriuria - Likely colonized due to self cathing   5.  High risk hematuria - Non-smoker - CTU (01/2024) NED - cysto (01/2024) NED   CC: Atonic bladder    HPI: Derrick Sandoval. is a 68 y.o.male who presents today for a 17-month follow-up.  Previous records reviewed.     He reports sensation of incomplete bladder emptying,   urinary frequency,   urinary intermittency,   urinary urgency,   a weak urinary stream,   having to strain to void,   nocturia x ***,   leaking before being able to reach the restroom,   leaking with coughing,   leaking without awareness,   and post void dribbling.     He is wearing *** pads//depends  daily.    Patient denies any modifying or aggravating factors.  Patient denies any recent UTI's, gross hematuria, dysuria or suprapubic/flank pain.  Patient denies any fevers, chills, nausea or vomiting.  ***  He has a family history of PCa, colon cancer, ovarian cancer and/or breast cancer with ***.   He does not have a family history of PCa, colon cancer, ovarian cancer, and/or breast cancer .***     UA***  PVR***  PSA (04/2024) 0.2  Serum creatinine (907974) 0.87  Hemoglobin A1c (04/2024) 5.6  BPH meds: Tamsulosin  0.4 mg daily  PMH: Past Medical History:   Diagnosis Date   Allergy    Arthritis    Cervical radiculopathy 01/16/2021   Cough    nonproductive no fever saw at dr 05-26-18   GERD (gastroesophageal reflux disease)    Hyperlipidemia    Hypertension    Meningitis due to viruses 1968    Surgical History: Past Surgical History:  Procedure Laterality Date   COLONOSCOPY WITH PROPOFOL  N/A 05/12/2018   Procedure: COLONOSCOPY WITH PROPOFOL ;  Surgeon: Therisa Bi, MD;  Location[facility] ENDOSCOPY;  Service: Gastroenterology;  Laterality: N/A;   ESOPHAGOGASTRODUODENOSCOPY (EGD) WITH PROPOFOL  N/A 05/12/2018   Procedure: ESOPHAGOGASTRODUODENOSCOPY (EGD) WITH PROPOFOL ;  Surgeon: Therisa Bi, MD;  Location[facility] ENDOSCOPY;  Service: Gastroenterology;  Laterality: N/A;   ESOPHAGOGASTRODUODENOSCOPY (EGD) WITH PROPOFOL  N/A 06/01/2018   Procedure: ESOPHAGOGASTRODUODENOSCOPY (EGD) WITH PROPOFOL ;  Surgeon: [name], MD;  Location: [facility] ENDOSCOPY;  Service: Endoscopy;  Laterality: N/A;   EUS N/A 06/01/2018   Procedure: UPPER ENDOSCOPIC ULTRASOUND (EUS) RADIAL;  Surgeon: [name], MD;  Location: [facility] ENDOSCOPY;  Service: Endoscopy;  Laterality: N/A;   JOINT REPLACEMENT Bilateral    NASAL SINUS SURGERY     TOTAL KNEE ARTHROPLASTY      Home Medications:  Allergies as of 08/06/2024   No Known Allergies      Medication List        Accurate as of July 30, 2024 11:36 AM. If you have any questions, ask your nurse  or doctor.          acyclovir  400 MG tablet Commonly known as: ZOVIRAX  Take 1 tablet (400 mg total) by mouth 2 (two) times daily.   albuterol  108 (90 Base) MCG/ACT inhaler Commonly known as: VENTOLIN  HFA Inhale 1-2 puffs into the lungs every 6 (six) hours as needed for wheezing or shortness of breath.   aspirin EC 81 MG tablet Take 81 mg by mouth daily.   azelastine 0.1 % nasal spray Commonly known as: ASTELIN SMARTSIG:1-2 Spray(s) Both Nares Twice Daily What changed: See the new instructions.   Breztri   Aerosphere 160-9-4.8 MCG/ACT Aero inhaler Generic drug: budesonide -glycopyrrolate -formoterol  Inhale 2 puffs into the lungs 2 (two) times daily. What changed: additional instructions   cetirizine  10 MG tablet Commonly known as: ZYRTEC  Take 1 tablet (10 mg total) by mouth daily.   cholecalciferol 1000 units tablet Commonly known as: VITAMIN D  Take 1,000 Units by mouth daily.   diclofenac  Sodium 1 % Gel Commonly known as: Voltaren  Apply 4 g topically 4 (four) times daily. What changed: additional instructions   doxycycline  100 MG tablet Commonly known as: VIBRA -TABS Take 1 tablet (100 mg total) by mouth 2 (two) times daily.   famotidine  20 MG tablet Commonly known as: PEPCID  Take 1 tablet (20 mg total) by mouth 2 (two) times daily.   fluticasone  50 MCG/ACT nasal spray Commonly known as: FLONASE  Place 2 sprays into both nostrils daily.   lisinopril  20 MG tablet Commonly known as: ZESTRIL  Take 1 tablet (20 mg total) by mouth daily.   methocarbamol  500 MG tablet Commonly known as: ROBAXIN  Take 1 tablet (500 mg total) by mouth daily as needed.   montelukast  10 MG tablet Commonly known as: SINGULAIR  Take 1 tablet (10 mg total) by mouth at bedtime.   multivitamin with minerals tablet Take 1 tablet by mouth daily.   mupirocin  ointment 2 % Commonly known as: BACTROBAN  Apply 1 Application topically 2 (two) times daily. What changed: additional instructions   naproxen  sodium 220 MG tablet Commonly known as: ALEVE  Take 220 mg by mouth daily as needed.   nystatin  cream Commonly known as: MYCOSTATIN  Apply 1 Application topically 2 (two) times daily. What changed: additional instructions   omeprazole  40 MG capsule Commonly known as: PRILOSEC Take 1 capsule (40 mg total) by mouth daily.   predniSONE  50 MG tablet Commonly known as: DELTASONE  Take 1 tablet (50 mg total) by mouth daily with breakfast.   sertraline  100 MG tablet Commonly known as: ZOLOFT  Take 1 tablet  (100 mg total) by mouth daily. What changed: additional instructions   tamsulosin  0.4 MG Caps capsule Commonly known as: FLOMAX  Take 1 capsule (0.4 mg total) by mouth daily.   triamcinolone  ointment 0.1 % Commonly known as: KENALOG  Apply 1 Application topically 2 (two) times daily. What changed: additional instructions   vitamin C 1000 MG tablet Take 1,000 mg by mouth at bedtime.        Allergies:  No Known Allergies  Family History: Family History  Problem Relation Age of Onset   Breast cancer Mother    Cancer Mother    Cancer Father        unsure, passed away before it was confirmed   Pancreatic cancer Daughter    Diabetes Neg Hx    Heart disease Neg Hx    Hypertension Neg Hx    Stroke Neg Hx    COPD Neg Hx    Colon cancer Neg Hx     Social History:  reports that he has never smoked. He has never been exposed to tobacco smoke. He has never used smokeless tobacco. He reports current alcohol use. He reports that he does not use drugs.   Physical Exam: There were no vitals taken for this visit.  Constitutional:  Well nourished. Alert and oriented, No acute distress. HEENT: [place] AT, moist mucus membranes.  Trachea midline, no masses. Cardiovascular: No clubbing, cyanosis, or edema. Respiratory: Normal respiratory effort, no increased work of breathing. GI: Abdomen is soft, non tender, non distended, no abdominal masses. Liver and spleen not palpable.  No hernias appreciated.  Stool sample for occult testing is not indicated.   GU: No CVA tenderness.  No bladder fullness or masses.  Patient with circumcised/uncircumcised phallus. ***Foreskin easily retracted***  Urethral meatus is patent.  No penile discharge. No penile lesions or rashes. Scrotum without lesions, cysts, rashes and/or edema.  Testicles are located scrotally bilaterally. No masses are appreciated in the testicles. Left and right epididymis are normal. Rectal: Patient with  normal sphincter tone. Anus and  perineum without scarring or rashes. No rectal masses are appreciated. Prostate is approximately *** grams, *** nodules are appreciated. Seminal vesicles are normal. Skin: No rashes, bruises or suspicious lesions. Lymph: No cervical or inguinal adenopathy. Neurologic: Grossly intact, no focal deficits, moving all 4 extremities. Psychiatric: Normal mood and affect.   Laboratory Data: See EPIC and HPI I have reviewed the labs.   Pertinent imaging. ***  Assessment & Plan:    1. High risk hematuria - Non-smoker -Work up in June 2025 with CT urogram and cystoscopy, NED -No reports of gross heme -UA ***  2. BPH with LUTS -PSA stable -PVR's demonstrates adequate emptying with self catheterization -continue conservative management, avoiding bladder irritants and timed voiding's -tamsulosin  0.4 mg daily  3. Incomplete bladder emptying -Patient caths 3 times daily, indefinitely due to stricture disease and BPH.  Patient requires coude caths because he has prostate enlargement and cannot advance straight catheter.     No follow-ups on file.  CLOTILDA CORNWALL, PA-C   [facility]  [facility]) [address] [place], [place] [address] [phone]

## 2024-08-06 ENCOUNTER — Ambulatory Visit: Admitting: Urology

## 2024-08-13 ENCOUNTER — Encounter: Payer: Self-pay | Admitting: Family Medicine

## 2024-08-13 ENCOUNTER — Ambulatory Visit: Payer: Self-pay

## 2024-08-13 NOTE — Telephone Encounter (Signed)
" ° °  Copied from CRM [id number]. Topic: Clinical - Red Word Triage >> Aug 13, 2024  8:52 AM Eva FALCON wrote: Red Word that prompted transfer to Nurse Triage: headaches, sinus drainage, extremely fatigue, just wants to sleep, ear pain, green mucus. "

## 2024-08-13 NOTE — Telephone Encounter (Signed)
 Attempted to call pt x2. VM left for pt. Will attempt to contact pt at a later time.   Copied from CRM [id number]. Topic: Clinical - Red Word Triage >> Aug 13, 2024  8:52 AM Eva FALCON wrote: Red Word that prompted transfer to Nurse Triage: headaches, sinus drainage, extremely fatigue, just wants to sleep, ear pain, green mucus.

## 2024-08-20 ENCOUNTER — Ambulatory Visit (INDEPENDENT_AMBULATORY_CARE_PROVIDER_SITE_OTHER)

## 2024-08-20 ENCOUNTER — Other Ambulatory Visit: Payer: Self-pay

## 2024-08-20 ENCOUNTER — Telehealth: Payer: Self-pay

## 2024-08-20 VITALS — BP 160/100 | HR 76 | Temp 97.8°F | Ht 67.0 in | Wt 273.6 lb

## 2024-08-20 DIAGNOSIS — J329 Chronic sinusitis, unspecified: Secondary | ICD-10-CM

## 2024-08-20 MED ORDER — AMOXICILLIN-POT CLAVULANATE 875-125 MG PO TABS: 1.0000 | ORAL_TABLET | Freq: Two times a day (BID) | ORAL | 0 refills | Status: AC

## 2024-08-20 MED ORDER — AMOXICILLIN-POT CLAVULANATE 875-125 MG PO TABS: 1.0000 | ORAL_TABLET | Freq: Two times a day (BID) | ORAL | 0 refills | Status: DC

## 2024-08-20 NOTE — Progress Notes (Signed)
 "  BP (!) 180/107 (BP Location: Left Arm, Cuff Size: Normal)   Pulse 76   Temp 97.8 F (36.6 C) (Oral)   Ht 5' 7 (1.702 m)   Wt 273 lb 9.6 oz (124.1 kg)   SpO2 97%   BMI 42.85 kg/m    Subjective:    Patient ID: Derrick Jama Bluford Mickey., male    DOB: October 28, 1955, 69 y.o.   MRN: [id number]  HPI: Derrick Eidson. is a 69 y.o. male complaining of 2 weeks of headache, green nasal drainage, occasional non productive cough.  He denies fever, chills, endorses some body aches, and extremely fatigued.  He has tried cetirizine  and monteleukast.    Chief Complaint  Patient presents with   URI    Patient states he has been having a cough, wheezing, nasal congestion, bilateral ear pain, sinus pressure and headaches for the last 2 weeks.     Relevant past medical, surgical, family and social history reviewed and updated as indicated. Interim medical history since our last visit reviewed. Allergies and medications reviewed and updated.  Review of Systems  Constitutional:  Positive for activity change and fatigue. Negative for chills and fever.  HENT:  Positive for congestion, ear pain, postnasal drip, rhinorrhea, sinus pressure, sinus pain, sneezing and sore throat.   Respiratory:  Positive for shortness of breath.   Cardiovascular:  Negative for chest pain.  Gastrointestinal:  Positive for nausea. Negative for diarrhea and vomiting.  Musculoskeletal:  Negative for neck pain.  Skin:  Negative for rash.  Neurological:  Positive for headaches.    Per HPI unless specifically indicated above     Objective:    BP (!) 180/107 (BP Location: Left Arm, Cuff Size: Normal)   Pulse 76   Temp 97.8 F (36.6 C) (Oral)   Ht 5' 7 (1.702 m)   Wt 273 lb 9.6 oz (124.1 kg)   SpO2 97%   BMI 42.85 kg/m   Wt Readings from Last 3 Encounters:  08/20/24 273 lb 9.6 oz (124.1 kg)  07/05/24 260 lb (117.9 kg)  06/01/24 259 lb 3.2 oz (117.6 kg)    Physical Exam Constitutional:      Appearance: He is obese.   HENT:     Head: Normocephalic and atraumatic.     Right Ear: Tympanic membrane is injected.     Left Ear: Tympanic membrane is injected.     Nose: Congestion and rhinorrhea present.     Right Turbinates: Swollen.     Left Turbinates: Swollen.     Right Sinus: Frontal sinus tenderness present.     Left Sinus: Frontal sinus tenderness present.     Mouth/Throat:     Mouth: Mucous membranes are moist.     Pharynx: Posterior oropharyngeal erythema present.  Cardiovascular:     Rate and Rhythm: Normal rate and regular rhythm.  Pulmonary:     Effort: Pulmonary effort is normal.     Breath sounds: Normal breath sounds. No stridor. No wheezing or rhonchi.  Musculoskeletal:     Cervical back: Normal range of motion and neck supple. No rigidity.  Lymphadenopathy:     Cervical: No cervical adenopathy.  Neurological:     Mental Status: He is alert.     Cranial Nerves: No cranial nerve deficit.     Results for orders placed or performed in visit on 04/30/24  Bayer DCA Hb A1c Waived   Collection Time: 04/30/24  9:52 AM  Result Value Ref Range   HB  A1C (BAYER DCA - WAIVED) 5.6 4.8 - 5.6 %  CBC with Differential/Platelet   Collection Time: 04/30/24  9:54 AM  Result Value Ref Range   WBC 7.0 3.4 - 10.8 x10E3/uL   RBC 4.88 4.14 - 5.80 x10E6/uL   Hemoglobin 15.0 13.0 - 17.7 g/dL   Hematocrit 54.0 62.4 - 51.0 %   MCV 94 79 - 97 fL   MCH 30.7 26.6 - 33.0 pg   MCHC 32.7 31.5 - 35.7 g/dL   RDW 86.6 88.3 - 84.5 %   Platelets 215 150 - 450 x10E3/uL   Neutrophils 54 Not Estab. %   Lymphs 32 Not Estab. %   Monocytes 10 Not Estab. %   Eos 3 Not Estab. %   Basos 1 Not Estab. %   Neutrophils Absolute 3.8 1.4 - 7.0 x10E3/uL   Lymphocytes Absolute 2.3 0.7 - 3.1 x10E3/uL   Monocytes Absolute 0.7 0.1 - 0.9 x10E3/uL   EOS (ABSOLUTE) 0.2 0.0 - 0.4 x10E3/uL   Basophils Absolute 0.0 0.0 - 0.2 x10E3/uL   Immature Granulocytes 0 Not Estab. %   Immature Grans (Abs) 0.0 0.0 - 0.1 x10E3/uL   Comprehensive metabolic panel with GFR   Collection Time: 04/30/24  9:54 AM  Result Value Ref Range   Glucose 100 (H) 70 - 99 mg/dL   BUN 19 8 - 27 mg/dL   Creatinine, Ser 9.12 0.76 - 1.27 mg/dL   eGFR 95 >40 fO/fpw/8.26   BUN/Creatinine Ratio 22 10 - 24   Sodium 142 134 - 144 mmol/L   Potassium 4.5 3.5 - 5.2 mmol/L   Chloride 105 96 - 106 mmol/L   CO2 21 20 - 29 mmol/L   Calcium 9.3 8.6 - 10.2 mg/dL   Total Protein 6.6 6.0 - 8.5 g/dL   Albumin 4.3 3.9 - 4.9 g/dL   Globulin, Total 2.3 1.5 - 4.5 g/dL   Bilirubin Total 0.4 0.0 - 1.2 mg/dL   Alkaline Phosphatase 75 47 - 123 IU/L   AST 63 (H) 0 - 40 IU/L   ALT 55 (H) 0 - 44 IU/L  Lipid Panel w/o Chol/HDL Ratio   Collection Time: 04/30/24  9:54 AM  Result Value Ref Range   Cholesterol, Total 135 100 - 199 mg/dL   Triglycerides 69 0 - 149 mg/dL   HDL 38 (L) >60 mg/dL   VLDL Cholesterol Cal 14 5 - 40 mg/dL   LDL Chol Calc (NIH) 83 0 - 99 mg/dL  PSA   Collection Time: 04/30/24  9:54 AM  Result Value Ref Range   Prostate Specific Ag, Serum 0.2 0.0 - 4.0 ng/mL      Assessment & Plan:   Assessment & Plan Recurrent sinusitis 2 weeks of nasal congestion, sinus/facial tenderness, neck pain, body aches, fatigue with no improvement. Treat with augmentin  x7 days.       Follow up plan: Follow up if no improvement.      "

## 2024-08-20 NOTE — Addendum Note (Signed)
 Addended by: CON NEST on: 08/20/2024 01:21 PM   Modules accepted: Orders

## 2024-08-20 NOTE — Assessment & Plan Note (Signed)
 2 weeks of nasal congestion, sinus/facial tenderness, neck pain, body aches, fatigue with no improvement. Treat with augmentin  x7 days.

## 2024-08-20 NOTE — Telephone Encounter (Signed)
 Copied from CRM [id number]. Topic: Clinical - Prescription Issue >> Aug 20, 2024 11:12 AM Zebedee SAUNDERS wrote: Reason for CRM: Received call from pharmacy regarding clarity on amoxicillin -clavulanate (AUGMENTIN ) 875-125 MG tablet frequency. Please call Derrick Sandoval, [id number] [address] Derrick Sandoval (N) [place] [address] Phone: [phone]  Fax: [phone]

## 2024-08-20 NOTE — Telephone Encounter (Signed)
 Routing to prescribing provider to advise. RX sent in this morning states take 1 tablet by mouth 2 (two) times daily for 1 day, dispense #1. Note states to take for 7 days. Please correct RX directions and quantity and resend.

## 2024-08-23 NOTE — Progress Notes (Signed)
 Derrick Sandoval.                                          MRN: [id number]   08/23/2024   The [facility] Quality Team Specialist reviewed this patient medical record for the purposes of chart review for care gap closure. The following were reviewed: chart review for care gap closure-controlling blood pressure.    [facility] Quality Team

## 2024-08-27 ENCOUNTER — Ambulatory Visit: Admitting: Urology

## 2024-08-29 ENCOUNTER — Encounter: Payer: Self-pay | Admitting: Family Medicine

## 2024-08-30 NOTE — Telephone Encounter (Signed)
 appt

## 2024-08-30 NOTE — Telephone Encounter (Signed)
 Scheduled.

## 2024-09-06 ENCOUNTER — Ambulatory Visit: Admitting: Family Medicine

## 2024-09-06 ENCOUNTER — Encounter: Payer: Self-pay | Admitting: Family Medicine

## 2024-09-06 VITALS — BP 142/96 | HR 99 | Temp 97.8°F | Resp 18 | Ht 67.0 in | Wt 273.2 lb

## 2024-09-06 DIAGNOSIS — H6993 Unspecified Eustachian tube disorder, bilateral: Secondary | ICD-10-CM

## 2024-09-06 MED ORDER — PREDNISONE 50 MG PO TABS: 50.0000 mg | ORAL_TABLET | Freq: Every day | ORAL | 0 refills | Status: AC

## 2024-09-06 MED ORDER — TRIAMCINOLONE ACETONIDE 40 MG/ML IJ SUSP
40.0000 mg | Freq: Once | INTRAMUSCULAR | Status: AC
  Administered 2024-09-06: 40 mg via INTRAMUSCULAR

## 2024-09-06 NOTE — Progress Notes (Signed)
 "  BP (!) 142/96 (BP Location: Left Arm, Patient Position: Sitting, Cuff Size: Normal)   Pulse 99   Temp 97.8 F (36.6 C) (Oral)   Resp 18   Ht 5' 7 (1.702 m)   Wt 273 lb 3.2 oz (123.9 kg)   SpO2 96%   BMI 42.79 kg/m    Subjective:    Patient ID: Derrick Jama Bluford Mickey., male    DOB: 1956/01/22, 69 y.o.   MRN: [id number]  HPI: Derrick Surette. is a 69 y.o. male  Chief Complaint  Patient presents with   Headache   Ear Pain    Get irritable    UPPER RESPIRATORY TRACT INFECTION Duration: 3 weeks Worst symptom: irritibility, headache, ear pain- bilaterally Fever: no Cough: yes Shortness of breath: no Wheezing: no Chest pain: no Chest tightness: no Chest congestion: no Nasal congestion: no Runny nose: no Post nasal drip: yes Sneezing: yes Sore throat: yes Swollen glands: yes Sinus pressure: yes Headache: yes Face pain: yes Toothache: yes Ear pain: yes bilateral Ear pressure: yes bilateral Eyes red/itching:no Eye drainage/crusting: no  Vomiting: no Rash: no Fatigue: yes Sick contacts: no Strep contacts: no  Context: stable Recurrent sinusitis: no Relief with OTC cold/cough medications: no  Treatments attempted: augmentin , allergy   Relevant past medical, surgical, family and social history reviewed and updated as indicated. Interim medical history since our last visit reviewed. Allergies and medications reviewed and updated.  Review of Systems  Constitutional:  Positive for fatigue and fever. Negative for activity change, appetite change, chills, diaphoresis and unexpected weight change.  HENT:  Positive for congestion, ear pain, postnasal drip, rhinorrhea, sinus pressure, sinus pain and sneezing. Negative for dental problem, drooling, ear discharge, facial swelling, hearing loss, mouth sores, nosebleeds, sore throat, tinnitus, trouble swallowing and voice change.   Eyes: Negative.   Respiratory:  Positive for cough and shortness of breath. Negative for  apnea, choking, chest tightness, wheezing and stridor.   Cardiovascular: Negative.   Gastrointestinal: Negative.   Musculoskeletal: Negative.     Per HPI unless specifically indicated above     Objective:    BP (!) 142/96 (BP Location: Left Arm, Patient Position: Sitting, Cuff Size: Normal)   Pulse 99   Temp 97.8 F (36.6 C) (Oral)   Resp 18   Ht 5' 7 (1.702 m)   Wt 273 lb 3.2 oz (123.9 kg)   SpO2 96%   BMI 42.79 kg/m   Wt Readings from Last 3 Encounters:  09/06/24 273 lb 3.2 oz (123.9 kg)  08/20/24 273 lb 9.6 oz (124.1 kg)  07/05/24 260 lb (117.9 kg)    Physical Exam Vitals and nursing note reviewed.  Constitutional:      General: He is not in acute distress.    Appearance: Normal appearance. He is well-developed. He is obese. He is not ill-appearing, toxic-appearing or diaphoretic.  HENT:     Head: Normocephalic and atraumatic.     Right Ear: Hearing, tympanic membrane, ear canal and external ear normal.     Left Ear: Hearing, tympanic membrane, ear canal and external ear normal.     Nose: Nose normal.     Right Turbinates: Not enlarged, swollen or pale.     Left Turbinates: Not enlarged, swollen or pale.     Right Sinus: No maxillary sinus tenderness or frontal sinus tenderness.     Left Sinus: No maxillary sinus tenderness or frontal sinus tenderness.     Mouth/Throat:     Mouth: Mucous  membranes are moist.     Pharynx: Oropharynx is clear.  Eyes:     General: No scleral icterus.       Right eye: No discharge.        Left eye: No discharge.     Extraocular Movements: Extraocular movements intact.     Conjunctiva/sclera: Conjunctivae normal.     Pupils: Pupils are equal, round, and reactive to light.  Cardiovascular:     Rate and Rhythm: Normal rate and regular rhythm.     Pulses: Normal pulses.     Heart sounds: Normal heart sounds. No murmur heard.    No friction rub. No gallop.  Pulmonary:     Effort: Pulmonary effort is normal. No respiratory distress.      Breath sounds: Normal breath sounds. No stridor. No wheezing, rhonchi or rales.  Chest:     Chest wall: No tenderness.  Musculoskeletal:        General: Normal range of motion.     Cervical back: Normal range of motion and neck supple.  Skin:    General: Skin is warm and dry.     Capillary Refill: Capillary refill takes less than 2 seconds.     Coloration: Skin is not jaundiced or pale.     Findings: No bruising, erythema, lesion or rash.  Neurological:     General: No focal deficit present.     Mental Status: He is alert and oriented to person, place, and time. Mental status is at baseline.  Psychiatric:        Mood and Affect: Mood normal.        Behavior: Behavior normal.        Thought Content: Thought content normal.        Judgment: Judgment normal.     Results for orders placed or performed in visit on 04/30/24  Bayer DCA Hb A1c Waived   Collection Time: 04/30/24  9:52 AM  Result Value Ref Range   HB A1C (BAYER DCA - WAIVED) 5.6 4.8 - 5.6 %  CBC with Differential/Platelet   Collection Time: 04/30/24  9:54 AM  Result Value Ref Range   WBC 7.0 3.4 - 10.8 x10E3/uL   RBC 4.88 4.14 - 5.80 x10E6/uL   Hemoglobin 15.0 13.0 - 17.7 g/dL   Hematocrit 54.0 62.4 - 51.0 %   MCV 94 79 - 97 fL   MCH 30.7 26.6 - 33.0 pg   MCHC 32.7 31.5 - 35.7 g/dL   RDW 86.6 88.3 - 84.5 %   Platelets 215 150 - 450 x10E3/uL   Neutrophils 54 Not Estab. %   Lymphs 32 Not Estab. %   Monocytes 10 Not Estab. %   Eos 3 Not Estab. %   Basos 1 Not Estab. %   Neutrophils Absolute 3.8 1.4 - 7.0 x10E3/uL   Lymphocytes Absolute 2.3 0.7 - 3.1 x10E3/uL   Monocytes Absolute 0.7 0.1 - 0.9 x10E3/uL   EOS (ABSOLUTE) 0.2 0.0 - 0.4 x10E3/uL   Basophils Absolute 0.0 0.0 - 0.2 x10E3/uL   Immature Granulocytes 0 Not Estab. %   Immature Grans (Abs) 0.0 0.0 - 0.1 x10E3/uL  Comprehensive metabolic panel with GFR   Collection Time: 04/30/24  9:54 AM  Result Value Ref Range   Glucose 100 (H) 70 - 99 mg/dL   BUN  19 8 - 27 mg/dL   Creatinine, Ser 9.12 0.76 - 1.27 mg/dL   eGFR 95 >40 fO/fpw/8.26   BUN/Creatinine Ratio 22 10 - 24   Sodium  142 134 - 144 mmol/L   Potassium 4.5 3.5 - 5.2 mmol/L   Chloride 105 96 - 106 mmol/L   CO2 21 20 - 29 mmol/L   Calcium 9.3 8.6 - 10.2 mg/dL   Total Protein 6.6 6.0 - 8.5 g/dL   Albumin 4.3 3.9 - 4.9 g/dL   Globulin, Total 2.3 1.5 - 4.5 g/dL   Bilirubin Total 0.4 0.0 - 1.2 mg/dL   Alkaline Phosphatase 75 47 - 123 IU/L   AST 63 (H) 0 - 40 IU/L   ALT 55 (H) 0 - 44 IU/L  Lipid Panel w/o Chol/HDL Ratio   Collection Time: 04/30/24  9:54 AM  Result Value Ref Range   Cholesterol, Total 135 100 - 199 mg/dL   Triglycerides 69 0 - 149 mg/dL   HDL 38 (L) >60 mg/dL   VLDL Cholesterol Cal 14 5 - 40 mg/dL   LDL Chol Calc (NIH) 83 0 - 99 mg/dL  PSA   Collection Time: 04/30/24  9:54 AM  Result Value Ref Range   Prostate Specific Ag, Serum 0.2 0.0 - 4.0 ng/mL      Assessment & Plan:   Problem List Items Addressed This Visit   None Visit Diagnoses       Dysfunction of both eustachian tubes    -  Primary   Will treat with prednisone  burst. Call with any concerns. Continue to monitor.   Relevant Medications   triamcinolone  acetonide (KENALOG -40) injection 40 mg (Start on 09/06/2024  4:30 PM)        Follow up plan: Return for As scheduled.      "

## 2024-09-11 ENCOUNTER — Encounter: Payer: Self-pay | Admitting: Family Medicine

## 2024-09-11 NOTE — Telephone Encounter (Signed)
 Scheduled

## 2024-09-14 ENCOUNTER — Ambulatory Visit: Admitting: Family Medicine

## 2024-09-18 ENCOUNTER — Ambulatory Visit (INDEPENDENT_AMBULATORY_CARE_PROVIDER_SITE_OTHER): Admitting: Family Medicine

## 2024-09-18 ENCOUNTER — Encounter: Payer: Self-pay | Admitting: Family Medicine

## 2024-09-18 VITALS — Temp 97.8°F | Resp 18 | Ht 67.0 in | Wt 276.8 lb

## 2024-09-18 DIAGNOSIS — R3911 Hesitancy of micturition: Secondary | ICD-10-CM | POA: Diagnosis not present

## 2024-09-18 DIAGNOSIS — N138 Other obstructive and reflux uropathy: Secondary | ICD-10-CM

## 2024-09-18 DIAGNOSIS — N401 Enlarged prostate with lower urinary tract symptoms: Secondary | ICD-10-CM

## 2024-09-18 DIAGNOSIS — N410 Acute prostatitis: Secondary | ICD-10-CM | POA: Diagnosis not present

## 2024-09-18 DIAGNOSIS — R339 Retention of urine, unspecified: Secondary | ICD-10-CM | POA: Diagnosis not present

## 2024-09-18 LAB — URINALYSIS, ROUTINE W REFLEX MICROSCOPIC
Bilirubin, UA: NEGATIVE
Glucose, UA: NEGATIVE
Ketones, UA: NEGATIVE
Nitrite, UA: NEGATIVE
Specific Gravity, UA: 1.025 (ref 1.005–1.030)
Urobilinogen, Ur: 0.2 mg/dL (ref 0.2–1.0)
pH, UA: 6 (ref 5.0–7.5)

## 2024-09-18 LAB — MICROSCOPIC EXAMINATION
Epithelial Cells (non renal): NONE SEEN /HPF (ref 0–10)
WBC, UA: >30 /HPF — AB (ref 0–5)

## 2024-09-18 MED ORDER — CIPROFLOXACIN HCL 500 MG PO TABS: 500.0000 mg | ORAL_TABLET | Freq: Two times a day (BID) | ORAL | 0 refills | Status: AC

## 2024-09-18 MED ORDER — TAMSULOSIN HCL 0.4 MG PO CAPS: 0.8000 mg | ORAL_CAPSULE | Freq: Every day | ORAL | 1 refills | Status: AC

## 2024-09-18 NOTE — Progress Notes (Signed)
 "  Temp 97.8 F (36.6 C) (Oral)   Resp 18   Ht 5' 7 (1.702 m)   Wt 276 lb 12.8 oz (125.6 kg)   SpO2 95%   BMI 43.35 kg/m    Subjective:    Patient ID: Derrick Sandoval., male    DOB: [birth year], 69 y.o.   MRN: [id number]  HPI: Derrick Eakins. is a 69 y.o. male  Chief Complaint  Patient presents with   Urinary Retention    Yellow urine, burning, not urinating just spraying not much coming out. Took amoxicillin  that Derrick Sandoval had and it did help a little. Doubled up on flomax  to see if that would help. Doing better but still feels weird and Derrick Sandoval's irritable.    URINARY SYMPTOMS Duration: about a week Dysuria: yes Urinary frequency: yes Urgency: yes Small volume voids: yes Symptom severity: moderate Urinary incontinence: no Foul odor: yes Hematuria: yes Abdominal pain: yes Back pain: yes Suprapubic pain/pressure: no Flank pain: no Fever:  yes Vomiting: no Relief with cranberry juice: no Relief with pyridium: no Status: better Previous urinary tract infection: yes Recurrent urinary tract infection: no History of sexually transmitted disease: no Penile discharge: no Treatments attempted: antibiotics and increasing fluids    Relevant past medical, surgical, family and social history reviewed and updated as indicated. Interim medical history since our last visit reviewed. Allergies and medications reviewed and updated.  Review of Systems  Constitutional: Negative.   Respiratory: Negative.    Cardiovascular: Negative.   Genitourinary:  Positive for decreased urine volume, difficulty urinating, dysuria, frequency, hematuria and urgency. Negative for enuresis, flank pain, genital sores, penile discharge, penile pain, penile swelling, scrotal swelling and testicular pain.  Musculoskeletal: Negative.   Neurological: Negative.   Psychiatric/Behavioral: Negative.      Per HPI unless specifically indicated above     Objective:    Temp 97.8 F (36.6 C) (Oral)   Resp  18   Ht 5' 7 (1.702 m)   Wt 276 lb 12.8 oz (125.6 kg)   SpO2 95%   BMI 43.35 kg/m   Wt Readings from Last 3 Encounters:  09/18/24 276 lb 12.8 oz (125.6 kg)  09/06/24 273 lb 3.2 oz (123.9 kg)  08/20/24 273 lb 9.6 oz (124.1 kg)    Physical Exam Vitals and nursing note reviewed.  Constitutional:      General: Derrick Sandoval is not in acute distress.    Appearance: Normal appearance. Derrick Sandoval is not ill-appearing, toxic-appearing or diaphoretic.  HENT:     Head: Normocephalic and atraumatic.     Right Ear: External ear normal.     Left Ear: External ear normal.     Nose: Nose normal.     Mouth/Throat:     Mouth: Mucous membranes are moist.     Pharynx: Oropharynx is clear.  Eyes:     General: No scleral icterus.       Right eye: No discharge.        Left eye: No discharge.     Extraocular Movements: Extraocular movements intact.     Conjunctiva/sclera: Conjunctivae normal.     Pupils: Pupils are equal, round, and reactive to light.  Cardiovascular:     Rate and Rhythm: Normal rate and regular rhythm.     Pulses: Normal pulses.     Heart sounds: Normal heart sounds. No murmur heard.    No friction rub. No gallop.  Pulmonary:     Effort: Pulmonary effort is normal. No respiratory distress.  Breath sounds: Normal breath sounds. No stridor. No wheezing, rhonchi or rales.  Chest:     Chest wall: No tenderness.  Musculoskeletal:        General: Normal range of motion.     Cervical back: Normal range of motion and neck supple.  Skin:    General: Skin is warm and dry.     Capillary Refill: Capillary refill takes less than 2 seconds.     Coloration: Skin is not jaundiced or pale.     Findings: No bruising, erythema, lesion or rash.  Neurological:     General: No focal deficit present.     Mental Status: Derrick Sandoval is alert and oriented to person, place, and time. Mental status is at baseline.  Psychiatric:        Mood and Affect: Mood normal.        Behavior: Behavior normal.        Thought  Content: Thought content normal.        Judgment: Judgment normal.     Results for orders placed or performed in visit on 04/30/24  Bayer DCA Hb A1c Waived   Collection Time: 04/30/24  9:52 AM  Result Value Ref Range   HB A1C (BAYER DCA - WAIVED) 5.6 4.8 - 5.6 %  CBC with Differential/Platelet   Collection Time: 04/30/24  9:54 AM  Result Value Ref Range   WBC 7.0 3.4 - 10.8 x10E3/uL   RBC 4.88 4.14 - 5.80 x10E6/uL   Hemoglobin 15.0 13.0 - 17.7 g/dL   Hematocrit 54.0 62.4 - 51.0 %   MCV 94 79 - 97 fL   MCH 30.7 26.6 - 33.0 pg   MCHC 32.7 31.5 - 35.7 g/dL   RDW 86.6 88.3 - 84.5 %   Platelets 215 150 - 450 x10E3/uL   Neutrophils 54 Not Estab. %   Lymphs 32 Not Estab. %   Monocytes 10 Not Estab. %   Eos 3 Not Estab. %   Basos 1 Not Estab. %   Neutrophils Absolute 3.8 1.4 - 7.0 x10E3/uL   Lymphocytes Absolute 2.3 0.7 - 3.1 x10E3/uL   Monocytes Absolute 0.7 0.1 - 0.9 x10E3/uL   EOS (ABSOLUTE) 0.2 0.0 - 0.4 x10E3/uL   Basophils Absolute 0.0 0.0 - 0.2 x10E3/uL   Immature Granulocytes 0 Not Estab. %   Immature Grans (Abs) 0.0 0.0 - 0.1 x10E3/uL  Comprehensive metabolic panel with GFR   Collection Time: 04/30/24  9:54 AM  Result Value Ref Range   Glucose 100 (H) 70 - 99 mg/dL   BUN 19 8 - 27 mg/dL   Creatinine, Ser 9.12 0.76 - 1.27 mg/dL   eGFR 95 >40 fO/fpw/8.26   BUN/Creatinine Ratio 22 10 - 24   Sodium 142 134 - 144 mmol/L   Potassium 4.5 3.5 - 5.2 mmol/L   Chloride 105 96 - 106 mmol/L   CO2 21 20 - 29 mmol/L   Calcium 9.3 8.6 - 10.2 mg/dL   Total Protein 6.6 6.0 - 8.5 g/dL   Albumin 4.3 3.9 - 4.9 g/dL   Globulin, Total 2.3 1.5 - 4.5 g/dL   Bilirubin Total 0.4 0.0 - 1.2 mg/dL   Alkaline Phosphatase 75 47 - 123 IU/L   AST 63 (H) 0 - 40 IU/L   ALT 55 (H) 0 - 44 IU/L  Lipid Panel w/o Chol/HDL Ratio   Collection Time: 04/30/24  9:54 AM  Result Value Ref Range   Cholesterol, Total 135 100 - 199 mg/dL   Triglycerides 69  0 - 149 mg/dL   HDL 38 (L) >60 mg/dL   VLDL  Cholesterol Cal 14 5 - 40 mg/dL   LDL Chol Calc (NIH) 83 0 - 99 mg/dL  PSA   Collection Time: 04/30/24  9:54 AM  Result Value Ref Range   Prostate Specific Ag, Serum 0.2 0.0 - 4.0 ng/mL      Assessment & Plan:   Problem List Items Addressed This Visit       Other   Morbid obesity (HCC)   Encouraged diet and exercise with goal of losing 1-2lbs per week.       Other Visit Diagnoses       Acute prostatitis    -  Primary   Will treat with cipro . Call if not getting better or getting worse. Follow up wiht urology as scheduled.     Urinary hesitancy       Concern for prostatitis. Will treat with cipro . Call if not getting better or getting worse. Follow up with urology as scheduled.   Relevant Orders   Urinalysis, Routine w reflex microscopic   Urine Culture   PSA   CBC with Differential/Platelet     Incomplete bladder emptying       Relevant Medications   tamsulosin  (FLOMAX ) 0.4 MG CAPS capsule     BPH with obstruction/lower urinary tract symptoms       Relevant Medications   tamsulosin  (FLOMAX ) 0.4 MG CAPS capsule        Follow up plan: Return if symptoms worsen or fail to improve.      "

## 2024-09-18 NOTE — Assessment & Plan Note (Signed)
Encouraged diet and exercise with goal of losing 1-2lbs per week.  

## 2024-09-19 LAB — CBC WITH DIFFERENTIAL/PLATELET
Basophils Absolute: 0.1 10*3/uL (ref 0.0–0.2)
Basos: 1 %
EOS (ABSOLUTE): 0.3 10*3/uL (ref 0.0–0.4)
Eos: 3 %
Hematocrit: 48 % (ref 37.5–51.0)
Hemoglobin: 16.1 g/dL (ref 13.0–17.7)
Immature Grans (Abs): 0 10*3/uL (ref 0.0–0.1)
Immature Granulocytes: 0 %
Lymphocytes Absolute: 3.4 10*3/uL — ABNORMAL HIGH (ref 0.7–3.1)
Lymphs: 36 %
MCH: 31 pg (ref 26.6–33.0)
MCHC: 33.5 g/dL (ref 31.5–35.7)
MCV: 93 fL (ref 79–97)
Monocytes Absolute: 0.9 10*3/uL (ref 0.1–0.9)
Monocytes: 10 %
Neutrophils Absolute: 4.8 10*3/uL (ref 1.4–7.0)
Neutrophils: 50 %
Platelets: 238 10*3/uL (ref 150–450)
RBC: 5.19 x10E6/uL (ref 4.14–5.80)
RDW: 13.6 % (ref 11.6–15.4)
WBC: 9.5 10*3/uL (ref 3.4–10.8)

## 2024-09-19 LAB — PSA: Prostate Specific Ag, Serum: 0.2 ng/mL (ref 0.0–4.0)

## 2024-09-20 ENCOUNTER — Encounter: Payer: Self-pay | Admitting: Family Medicine

## 2024-09-20 NOTE — Telephone Encounter (Signed)
 Addressed in separate message

## 2024-09-21 ENCOUNTER — Ambulatory Visit: Payer: Self-pay | Admitting: Family Medicine

## 2024-09-21 LAB — URINE CULTURE

## 2024-10-29 ENCOUNTER — Encounter: Admitting: Family Medicine

## 2025-01-03 ENCOUNTER — Ambulatory Visit: Admitting: Urology

## 2025-07-09 ENCOUNTER — Ambulatory Visit
